# Patient Record
Sex: Female | Born: 1945 | Race: White | Hispanic: No | Marital: Married | Smoking: Never smoker
Health system: Southern US, Community
[De-identification: ages and names within clinical notes are randomized; demographics above are authoritative.]

## PROBLEM LIST (undated history)

## (undated) DIAGNOSIS — M549 Dorsalgia, unspecified: Secondary | ICD-10-CM

## (undated) DIAGNOSIS — M810 Age-related osteoporosis without current pathological fracture: Secondary | ICD-10-CM

## (undated) DIAGNOSIS — J45909 Unspecified asthma, uncomplicated: Secondary | ICD-10-CM

## (undated) DIAGNOSIS — G43909 Migraine, unspecified, not intractable, without status migrainosus: Secondary | ICD-10-CM

## (undated) DIAGNOSIS — H269 Unspecified cataract: Secondary | ICD-10-CM

## (undated) DIAGNOSIS — G8929 Other chronic pain: Secondary | ICD-10-CM

## (undated) DIAGNOSIS — D72819 Decreased white blood cell count, unspecified: Secondary | ICD-10-CM

## (undated) DIAGNOSIS — T7840XA Allergy, unspecified, initial encounter: Secondary | ICD-10-CM

## (undated) DIAGNOSIS — K648 Other hemorrhoids: Secondary | ICD-10-CM

## (undated) DIAGNOSIS — R42 Dizziness and giddiness: Secondary | ICD-10-CM

## (undated) DIAGNOSIS — D759 Disease of blood and blood-forming organs, unspecified: Secondary | ICD-10-CM

## (undated) DIAGNOSIS — M5431 Sciatica, right side: Secondary | ICD-10-CM

## (undated) DIAGNOSIS — M21371 Foot drop, right foot: Secondary | ICD-10-CM

## (undated) DIAGNOSIS — M797 Fibromyalgia: Secondary | ICD-10-CM

## (undated) DIAGNOSIS — Q631 Lobulated, fused and horseshoe kidney: Secondary | ICD-10-CM

## (undated) DIAGNOSIS — M1990 Unspecified osteoarthritis, unspecified site: Secondary | ICD-10-CM

## (undated) DIAGNOSIS — K219 Gastro-esophageal reflux disease without esophagitis: Secondary | ICD-10-CM

## (undated) DIAGNOSIS — Z87442 Personal history of urinary calculi: Secondary | ICD-10-CM

## (undated) DIAGNOSIS — R52 Pain, unspecified: Secondary | ICD-10-CM

## (undated) DIAGNOSIS — J329 Chronic sinusitis, unspecified: Secondary | ICD-10-CM

## (undated) HISTORY — DX: Unspecified osteoarthritis, unspecified site: M19.90

## (undated) HISTORY — DX: Decreased white blood cell count, unspecified: D72.819

## (undated) HISTORY — DX: Dizziness and giddiness: R42

## (undated) HISTORY — DX: Allergy, unspecified, initial encounter: T78.40XA

## (undated) HISTORY — DX: Migraine, unspecified, not intractable, without status migrainosus: G43.909

## (undated) HISTORY — DX: Chronic sinusitis, unspecified: J32.9

## (undated) HISTORY — DX: Gastro-esophageal reflux disease without esophagitis: K21.9

## (undated) HISTORY — DX: Other hemorrhoids: K64.8

## (undated) HISTORY — DX: Fibromyalgia: M79.7

## (undated) HISTORY — DX: Unspecified cataract: H26.9

## (undated) HISTORY — DX: Unspecified asthma, uncomplicated: J45.909

## (undated) HISTORY — PX: OTHER SURGICAL HISTORY: SHX169

## (undated) HISTORY — PX: TONSILLECTOMY: SUR1361

---

## 1966-07-26 HISTORY — PX: APPENDECTOMY: SHX54

## 1977-07-26 HISTORY — PX: BREAST EXCISIONAL BIOPSY: SUR124

## 1981-07-26 HISTORY — PX: ABDOMINAL HYSTERECTOMY: SHX81

## 1998-01-21 ENCOUNTER — Ambulatory Visit (HOSPITAL_COMMUNITY): Admission: RE | Admit: 1998-01-21 | Discharge: 1998-01-21 | Payer: Self-pay | Admitting: Obstetrics & Gynecology

## 1998-05-09 ENCOUNTER — Ambulatory Visit (HOSPITAL_COMMUNITY): Admission: RE | Admit: 1998-05-09 | Discharge: 1998-05-09 | Payer: Self-pay

## 1998-07-26 HISTORY — PX: BILATERAL SALPINGOOPHORECTOMY: SHX1223

## 1998-09-26 ENCOUNTER — Other Ambulatory Visit: Admission: RE | Admit: 1998-09-26 | Discharge: 1998-09-26 | Payer: Self-pay | Admitting: *Deleted

## 1998-11-06 ENCOUNTER — Ambulatory Visit (HOSPITAL_COMMUNITY): Admission: RE | Admit: 1998-11-06 | Discharge: 1998-11-06 | Payer: Self-pay | Admitting: *Deleted

## 1999-02-06 ENCOUNTER — Encounter: Payer: Self-pay | Admitting: Neurosurgery

## 1999-02-06 ENCOUNTER — Ambulatory Visit (HOSPITAL_COMMUNITY): Admission: RE | Admit: 1999-02-06 | Discharge: 1999-02-06 | Payer: Self-pay | Admitting: Neurosurgery

## 1999-08-11 ENCOUNTER — Encounter: Admission: RE | Admit: 1999-08-11 | Discharge: 1999-08-11 | Payer: Self-pay | Admitting: Allergy and Immunology

## 1999-08-11 ENCOUNTER — Encounter: Payer: Self-pay | Admitting: Allergy and Immunology

## 1999-08-25 ENCOUNTER — Encounter: Payer: Self-pay | Admitting: Neurosurgery

## 1999-08-25 ENCOUNTER — Ambulatory Visit (HOSPITAL_COMMUNITY): Admission: RE | Admit: 1999-08-25 | Discharge: 1999-08-25 | Payer: Self-pay

## 1999-09-03 ENCOUNTER — Encounter: Payer: Self-pay | Admitting: Neurosurgery

## 1999-09-07 ENCOUNTER — Encounter: Payer: Self-pay | Admitting: Neurosurgery

## 1999-09-07 ENCOUNTER — Inpatient Hospital Stay (HOSPITAL_COMMUNITY): Admission: RE | Admit: 1999-09-07 | Discharge: 1999-09-08 | Payer: Self-pay | Admitting: Neurosurgery

## 1999-09-29 ENCOUNTER — Encounter: Payer: Self-pay | Admitting: Neurosurgery

## 1999-09-29 ENCOUNTER — Encounter: Admission: RE | Admit: 1999-09-29 | Discharge: 1999-09-29 | Payer: Self-pay | Admitting: Neurosurgery

## 1999-11-18 ENCOUNTER — Encounter: Admission: RE | Admit: 1999-11-18 | Discharge: 1999-11-18 | Payer: Self-pay | Admitting: *Deleted

## 1999-12-16 ENCOUNTER — Ambulatory Visit (HOSPITAL_COMMUNITY): Admission: RE | Admit: 1999-12-16 | Discharge: 1999-12-16 | Payer: Self-pay | Admitting: Neurosurgery

## 1999-12-16 ENCOUNTER — Encounter: Payer: Self-pay | Admitting: Neurosurgery

## 2000-10-07 ENCOUNTER — Encounter: Payer: Self-pay | Admitting: Allergy and Immunology

## 2000-10-07 ENCOUNTER — Encounter: Admission: RE | Admit: 2000-10-07 | Discharge: 2000-10-07 | Payer: Self-pay

## 2000-11-23 ENCOUNTER — Encounter: Admission: RE | Admit: 2000-11-23 | Discharge: 2000-11-23 | Payer: Self-pay | Admitting: *Deleted

## 2000-11-23 ENCOUNTER — Encounter: Payer: Self-pay | Admitting: *Deleted

## 2000-12-05 ENCOUNTER — Other Ambulatory Visit: Admission: RE | Admit: 2000-12-05 | Discharge: 2000-12-05 | Payer: Self-pay | Admitting: *Deleted

## 2001-04-22 ENCOUNTER — Encounter: Admission: RE | Admit: 2001-04-22 | Discharge: 2001-04-22 | Payer: Self-pay | Admitting: *Deleted

## 2001-10-24 ENCOUNTER — Encounter: Admission: RE | Admit: 2001-10-24 | Discharge: 2001-10-24 | Payer: Self-pay | Admitting: Gastroenterology

## 2001-10-24 ENCOUNTER — Encounter: Payer: Self-pay | Admitting: Gastroenterology

## 2001-11-28 ENCOUNTER — Encounter: Payer: Self-pay | Admitting: *Deleted

## 2001-11-28 ENCOUNTER — Encounter: Admission: RE | Admit: 2001-11-28 | Discharge: 2001-11-28 | Payer: Self-pay | Admitting: *Deleted

## 2001-12-29 ENCOUNTER — Other Ambulatory Visit: Admission: RE | Admit: 2001-12-29 | Discharge: 2001-12-29 | Payer: Self-pay | Admitting: *Deleted

## 2002-06-11 ENCOUNTER — Encounter: Payer: Self-pay | Admitting: Family Medicine

## 2002-06-11 ENCOUNTER — Encounter: Admission: RE | Admit: 2002-06-11 | Discharge: 2002-06-11 | Payer: Self-pay | Admitting: Family Medicine

## 2002-12-04 ENCOUNTER — Encounter: Payer: Self-pay | Admitting: *Deleted

## 2002-12-04 ENCOUNTER — Encounter: Admission: RE | Admit: 2002-12-04 | Discharge: 2002-12-04 | Payer: Self-pay | Admitting: *Deleted

## 2003-01-22 ENCOUNTER — Other Ambulatory Visit: Admission: RE | Admit: 2003-01-22 | Discharge: 2003-01-22 | Payer: Self-pay | Admitting: *Deleted

## 2003-07-15 ENCOUNTER — Encounter: Admission: RE | Admit: 2003-07-15 | Discharge: 2003-07-15 | Payer: Self-pay | Admitting: Family Medicine

## 2003-12-25 ENCOUNTER — Encounter: Admission: RE | Admit: 2003-12-25 | Discharge: 2003-12-25 | Payer: Self-pay | Admitting: *Deleted

## 2004-01-09 ENCOUNTER — Observation Stay (HOSPITAL_COMMUNITY): Admission: EM | Admit: 2004-01-09 | Discharge: 2004-01-10 | Payer: Self-pay | Admitting: Emergency Medicine

## 2004-02-05 ENCOUNTER — Other Ambulatory Visit: Admission: RE | Admit: 2004-02-05 | Discharge: 2004-02-05 | Payer: Self-pay | Admitting: Obstetrics and Gynecology

## 2004-06-03 ENCOUNTER — Ambulatory Visit: Payer: Self-pay | Admitting: Family Medicine

## 2004-07-01 ENCOUNTER — Ambulatory Visit: Payer: Self-pay | Admitting: Family Medicine

## 2004-07-29 ENCOUNTER — Ambulatory Visit: Payer: Self-pay | Admitting: Family Medicine

## 2004-09-17 ENCOUNTER — Ambulatory Visit: Payer: Self-pay | Admitting: Family Medicine

## 2004-10-20 ENCOUNTER — Ambulatory Visit: Payer: Self-pay | Admitting: Family Medicine

## 2004-10-28 ENCOUNTER — Encounter: Admission: RE | Admit: 2004-10-28 | Discharge: 2004-10-28 | Payer: Self-pay | Admitting: Family Medicine

## 2004-11-19 ENCOUNTER — Ambulatory Visit: Payer: Self-pay | Admitting: Family Medicine

## 2004-11-24 ENCOUNTER — Ambulatory Visit: Payer: Self-pay | Admitting: Professional

## 2004-12-08 ENCOUNTER — Ambulatory Visit: Payer: Self-pay | Admitting: Professional

## 2004-12-15 ENCOUNTER — Ambulatory Visit: Payer: Self-pay | Admitting: Professional

## 2004-12-22 ENCOUNTER — Ambulatory Visit: Payer: Self-pay | Admitting: Professional

## 2004-12-23 ENCOUNTER — Ambulatory Visit: Payer: Self-pay | Admitting: Family Medicine

## 2005-01-05 ENCOUNTER — Ambulatory Visit: Payer: Self-pay | Admitting: Professional

## 2005-01-08 ENCOUNTER — Encounter: Admission: RE | Admit: 2005-01-08 | Discharge: 2005-01-08 | Payer: Self-pay | Admitting: Family Medicine

## 2005-01-20 ENCOUNTER — Ambulatory Visit: Payer: Self-pay | Admitting: Family Medicine

## 2005-02-02 ENCOUNTER — Ambulatory Visit: Payer: Self-pay | Admitting: Professional

## 2005-02-09 ENCOUNTER — Ambulatory Visit: Payer: Self-pay | Admitting: Professional

## 2005-02-16 ENCOUNTER — Ambulatory Visit: Payer: Self-pay | Admitting: Professional

## 2005-02-23 ENCOUNTER — Other Ambulatory Visit: Admission: RE | Admit: 2005-02-23 | Discharge: 2005-02-23 | Payer: Self-pay | Admitting: Family Medicine

## 2005-02-23 ENCOUNTER — Ambulatory Visit: Payer: Self-pay | Admitting: Professional

## 2005-02-23 ENCOUNTER — Ambulatory Visit: Payer: Self-pay | Admitting: Family Medicine

## 2005-03-09 ENCOUNTER — Ambulatory Visit: Payer: Self-pay | Admitting: Professional

## 2005-03-11 ENCOUNTER — Ambulatory Visit: Payer: Self-pay | Admitting: Family Medicine

## 2005-03-16 ENCOUNTER — Ambulatory Visit: Payer: Self-pay | Admitting: Professional

## 2005-03-30 ENCOUNTER — Ambulatory Visit: Payer: Self-pay | Admitting: Professional

## 2005-04-12 ENCOUNTER — Ambulatory Visit: Payer: Self-pay | Admitting: Family Medicine

## 2005-04-27 ENCOUNTER — Ambulatory Visit: Payer: Self-pay | Admitting: Professional

## 2005-05-11 ENCOUNTER — Ambulatory Visit: Payer: Self-pay | Admitting: Family Medicine

## 2005-05-11 ENCOUNTER — Ambulatory Visit: Payer: Self-pay | Admitting: Professional

## 2005-06-08 ENCOUNTER — Ambulatory Visit: Payer: Self-pay | Admitting: Family Medicine

## 2005-06-13 ENCOUNTER — Emergency Department (HOSPITAL_COMMUNITY): Admission: EM | Admit: 2005-06-13 | Discharge: 2005-06-13 | Payer: Self-pay | Admitting: Family Medicine

## 2005-07-28 ENCOUNTER — Ambulatory Visit: Payer: Self-pay | Admitting: Family Medicine

## 2005-08-10 ENCOUNTER — Encounter: Admission: RE | Admit: 2005-08-10 | Discharge: 2005-08-10 | Payer: Self-pay | Admitting: Allergy and Immunology

## 2005-09-20 ENCOUNTER — Ambulatory Visit: Payer: Self-pay | Admitting: Family Medicine

## 2005-11-10 ENCOUNTER — Ambulatory Visit: Payer: Self-pay | Admitting: Family Medicine

## 2006-01-10 ENCOUNTER — Encounter: Admission: RE | Admit: 2006-01-10 | Discharge: 2006-01-10 | Payer: Self-pay | Admitting: Family Medicine

## 2006-02-07 ENCOUNTER — Ambulatory Visit: Payer: Self-pay | Admitting: Family Medicine

## 2006-02-16 ENCOUNTER — Ambulatory Visit: Payer: Self-pay | Admitting: Gastroenterology

## 2006-03-08 ENCOUNTER — Ambulatory Visit: Payer: Self-pay | Admitting: Family Medicine

## 2006-04-22 ENCOUNTER — Encounter: Admission: RE | Admit: 2006-04-22 | Discharge: 2006-04-22 | Payer: Self-pay | Admitting: Family Medicine

## 2006-04-22 ENCOUNTER — Ambulatory Visit: Payer: Self-pay | Admitting: Family Medicine

## 2006-05-03 ENCOUNTER — Ambulatory Visit: Payer: Self-pay | Admitting: Family Medicine

## 2006-08-01 ENCOUNTER — Ambulatory Visit: Payer: Self-pay | Admitting: Family Medicine

## 2006-09-23 ENCOUNTER — Ambulatory Visit: Payer: Self-pay | Admitting: Family Medicine

## 2006-09-26 ENCOUNTER — Encounter: Admission: RE | Admit: 2006-09-26 | Discharge: 2006-09-26 | Payer: Self-pay | Admitting: Obstetrics and Gynecology

## 2006-10-04 ENCOUNTER — Ambulatory Visit: Payer: Self-pay | Admitting: Family Medicine

## 2006-10-04 LAB — CONVERTED CEMR LAB
BUN: 9 mg/dL (ref 6–23)
Basophils Relative: 0.6 % (ref 0.0–1.0)
CO2: 26 meq/L (ref 19–32)
Eosinophils Absolute: 0.1 10*3/uL (ref 0.0–0.6)
Eosinophils Relative: 3.1 % (ref 0.0–5.0)
GFR calc Af Amer: 109 mL/min
Glucose, Bld: 72 mg/dL (ref 70–99)
HCT: 39.7 % (ref 36.0–46.0)
Lymphocytes Relative: 39.8 % (ref 12.0–46.0)
MCV: 90.2 fL (ref 78.0–100.0)
Monocytes Absolute: 0.4 10*3/uL (ref 0.2–0.7)
Neutrophils Relative %: 45.1 % (ref 43.0–77.0)
Platelets: 207 10*3/uL (ref 150–400)
Potassium: 2.9 meq/L — ABNORMAL LOW (ref 3.5–5.1)
RBC: 4.4 M/uL (ref 3.87–5.11)
Sodium: 140 meq/L (ref 135–145)
WBC: 3.5 10*3/uL — ABNORMAL LOW (ref 4.5–10.5)

## 2006-10-07 ENCOUNTER — Ambulatory Visit: Payer: Self-pay | Admitting: Family Medicine

## 2006-10-07 LAB — CONVERTED CEMR LAB: Potassium: 3.9 meq/L (ref 3.5–5.1)

## 2006-11-18 ENCOUNTER — Ambulatory Visit: Payer: Self-pay | Admitting: Family Medicine

## 2007-01-19 ENCOUNTER — Encounter: Admission: RE | Admit: 2007-01-19 | Discharge: 2007-01-19 | Payer: Self-pay | Admitting: Obstetrics & Gynecology

## 2007-02-08 ENCOUNTER — Telehealth (INDEPENDENT_AMBULATORY_CARE_PROVIDER_SITE_OTHER): Payer: Self-pay | Admitting: *Deleted

## 2007-03-07 ENCOUNTER — Ambulatory Visit: Payer: Self-pay | Admitting: Family Medicine

## 2007-03-07 DIAGNOSIS — G5600 Carpal tunnel syndrome, unspecified upper limb: Secondary | ICD-10-CM | POA: Insufficient documentation

## 2007-03-07 DIAGNOSIS — I868 Varicose veins of other specified sites: Secondary | ICD-10-CM | POA: Insufficient documentation

## 2007-03-07 DIAGNOSIS — M797 Fibromyalgia: Secondary | ICD-10-CM | POA: Insufficient documentation

## 2007-03-07 DIAGNOSIS — E739 Lactose intolerance, unspecified: Secondary | ICD-10-CM | POA: Insufficient documentation

## 2007-03-07 DIAGNOSIS — G43109 Migraine with aura, not intractable, without status migrainosus: Secondary | ICD-10-CM | POA: Insufficient documentation

## 2007-03-07 DIAGNOSIS — Z87442 Personal history of urinary calculi: Secondary | ICD-10-CM | POA: Insufficient documentation

## 2007-03-07 DIAGNOSIS — G43809 Other migraine, not intractable, without status migrainosus: Secondary | ICD-10-CM | POA: Insufficient documentation

## 2007-03-07 DIAGNOSIS — F411 Generalized anxiety disorder: Secondary | ICD-10-CM | POA: Insufficient documentation

## 2007-03-21 ENCOUNTER — Encounter: Payer: Self-pay | Admitting: Family Medicine

## 2007-04-11 ENCOUNTER — Encounter: Payer: Self-pay | Admitting: Family Medicine

## 2007-04-23 ENCOUNTER — Emergency Department (HOSPITAL_COMMUNITY): Admission: EM | Admit: 2007-04-23 | Discharge: 2007-04-23 | Payer: Self-pay | Admitting: Family Medicine

## 2007-04-24 ENCOUNTER — Ambulatory Visit: Payer: Self-pay | Admitting: Internal Medicine

## 2007-04-25 ENCOUNTER — Ambulatory Visit: Payer: Self-pay | Admitting: Pulmonary Disease

## 2007-04-25 LAB — CONVERTED CEMR LAB
Calcium: 9.5 mg/dL (ref 8.4–10.5)
Chloride: 105 meq/L (ref 96–112)
Creatinine, Ser: 0.6 mg/dL (ref 0.4–1.2)
GFR calc non Af Amer: 107 mL/min
Potassium: 3.7 meq/L (ref 3.5–5.1)
Sodium: 139 meq/L (ref 135–145)

## 2007-04-28 ENCOUNTER — Ambulatory Visit: Payer: Self-pay | Admitting: Internal Medicine

## 2007-05-01 ENCOUNTER — Ambulatory Visit: Admission: RE | Admit: 2007-05-01 | Discharge: 2007-05-01 | Payer: Self-pay | Admitting: Pulmonary Disease

## 2007-05-09 ENCOUNTER — Encounter: Payer: Self-pay | Admitting: Family Medicine

## 2007-05-10 ENCOUNTER — Ambulatory Visit: Payer: Self-pay | Admitting: Pulmonary Disease

## 2007-05-12 ENCOUNTER — Encounter: Payer: Self-pay | Admitting: Family Medicine

## 2007-05-18 ENCOUNTER — Encounter: Payer: Self-pay | Admitting: Family Medicine

## 2007-06-09 ENCOUNTER — Ambulatory Visit: Payer: Self-pay | Admitting: Family Medicine

## 2007-06-09 DIAGNOSIS — J383 Other diseases of vocal cords: Secondary | ICD-10-CM | POA: Insufficient documentation

## 2007-07-06 ENCOUNTER — Encounter: Payer: Self-pay | Admitting: Family Medicine

## 2007-07-11 ENCOUNTER — Encounter: Payer: Self-pay | Admitting: Family Medicine

## 2007-08-08 ENCOUNTER — Telehealth: Payer: Self-pay | Admitting: Pulmonary Disease

## 2007-08-10 ENCOUNTER — Encounter: Payer: Self-pay | Admitting: Family Medicine

## 2007-12-13 ENCOUNTER — Ambulatory Visit: Payer: Self-pay | Admitting: Pulmonary Disease

## 2007-12-13 DIAGNOSIS — J301 Allergic rhinitis due to pollen: Secondary | ICD-10-CM | POA: Insufficient documentation

## 2008-01-03 ENCOUNTER — Encounter (INDEPENDENT_AMBULATORY_CARE_PROVIDER_SITE_OTHER): Payer: Self-pay | Admitting: Internal Medicine

## 2008-01-03 ENCOUNTER — Ambulatory Visit: Payer: Self-pay | Admitting: Family Medicine

## 2008-01-03 DIAGNOSIS — E876 Hypokalemia: Secondary | ICD-10-CM | POA: Insufficient documentation

## 2008-01-04 LAB — CONVERTED CEMR LAB
BUN: 16 mg/dL (ref 6–23)
CO2: 26 meq/L (ref 19–32)
GFR calc Af Amer: 93 mL/min
Glucose, Bld: 78 mg/dL (ref 70–99)
Potassium: 3.7 meq/L (ref 3.5–5.1)

## 2008-01-22 ENCOUNTER — Encounter: Admission: RE | Admit: 2008-01-22 | Discharge: 2008-01-22 | Payer: Self-pay | Admitting: Obstetrics & Gynecology

## 2008-07-26 HISTORY — PX: NASAL SINUS SURGERY: SHX719

## 2009-01-22 ENCOUNTER — Encounter: Admission: RE | Admit: 2009-01-22 | Discharge: 2009-01-22 | Payer: Self-pay | Admitting: Obstetrics & Gynecology

## 2009-05-20 ENCOUNTER — Encounter: Admission: RE | Admit: 2009-05-20 | Discharge: 2009-05-20 | Payer: Self-pay | Admitting: Internal Medicine

## 2010-01-23 ENCOUNTER — Encounter: Admission: RE | Admit: 2010-01-23 | Discharge: 2010-01-23 | Payer: Self-pay | Admitting: Obstetrics & Gynecology

## 2010-04-02 ENCOUNTER — Encounter: Admission: RE | Admit: 2010-04-02 | Discharge: 2010-04-02 | Payer: Self-pay | Admitting: Otolaryngology

## 2010-11-18 ENCOUNTER — Other Ambulatory Visit (INDEPENDENT_AMBULATORY_CARE_PROVIDER_SITE_OTHER): Payer: Self-pay | Admitting: Otolaryngology

## 2010-11-18 DIAGNOSIS — J320 Chronic maxillary sinusitis: Secondary | ICD-10-CM

## 2010-11-25 ENCOUNTER — Other Ambulatory Visit: Payer: Self-pay

## 2010-12-01 ENCOUNTER — Ambulatory Visit
Admission: RE | Admit: 2010-12-01 | Discharge: 2010-12-01 | Disposition: A | Discharge status: Home / Self Care | Payer: Medicare Other | Source: Ambulatory Visit | Attending: Otolaryngology | Admitting: Otolaryngology

## 2010-12-01 DIAGNOSIS — J320 Chronic maxillary sinusitis: Secondary | ICD-10-CM

## 2010-12-08 NOTE — Assessment & Plan Note (Signed)
[facility]                             PULMONARY OFFICE NOTE   NAME:Dixon, Leslie ISMAEL                       MRN:          [id number]  DATE:05/10/2007                            DOB:          05-03-1946    SUBJECTIVE:  Ms. Stuckey comes in today after her recent high-resolution  CT scan as well as full PFTs.  At the last visit, it was clear that she  had classic vocal cord dysfunction, and it was unclear whether she  really had asthma or not.  Her most recent chest x-ray showed a very  prominent interstitial pattern that the radiologist agreed may be  significant.  She underwent a CT scan of the chest as well as PFTs.  Her  PFTs were totally within normal limits with no airflow obstruction,  restriction, or DLCO abnormality.  Her high-resolution CT showed no  evidence of bronchiectasis or subpleural fibrosis.  The patient, since  the last visit, has been having a great deal of coughing whenever she  takes her Symbicort.  She has discontinued this at least for a few  weeks, and has seen no difference in her breathing.  She has been trying  to avoid throat-clearing as much as possible with non-mentholated  lozenges.  She does have a followup with a voice disorder center in  December.   PHYSICAL EXAM:  GENERAL:  She is an overweight female in no acute  distress.  Blood pressure 108/58, pulse 97, temperature 98, weight 150 pounds, O2  saturation on room air is 99%.  CHEST:  Totally clear.  CARDIAC:  Regular rate and rhythm.   IMPRESSION:  Significant upper airway dysfunction secondary to vocal  cord dysfunction.  I also think that post-nasal drip from allergies, as  well as possible laryngopharyngeal reflux could be playing a role.  I  think that it is very unlikely that she has asthma at this point in  time, and I have asked her to discontinue all of her inhalers, which can  definitely irritate the upper airway.  I have told her, however, that if  she  begins to have shortness of breath, chest tightness, or increasing  symptoms consistently, that she is to get into the office immediately so  we can verify whether she really does have airflow obstruction or not.  The patient is totally happy with this decision.   PLAN:  1. Continue medication for possible reflux and allergic rhinitis.  2. Discontinue all asthma medications.  She is to follow up if she      begins to have difficulties so we can verify airflow obstruction.  3. Keep followup with the Voice [facility] Center at [facility] for her      vocal cord dysfunction.  4. The patient will follow up on a p.r.n. basis.     Barbaraann Share, MD,FCCP  Electronically Signed    [facility]/MedQ  DD: 05/10/2007  DT: 05/11/2007  Job #: [id number]   cc:   Marne A. Milinda Antis, MD

## 2010-12-08 NOTE — Assessment & Plan Note (Signed)
[facility]                             PULMONARY OFFICE NOTE   NAME:Leslie Dixon, Leslie Dixon                       MRN:          [id number]  DATE:04/25/2007                            DOB:          Sep 19, 1945    HISTORY OF PRESENT ILLNESS:  The patient is a very pleasant 65 year old  white female who I have been asked to see for cough and possible asthma.  The patient carries a diagnosis of presumed asthma that she has had  since 1992 and is followed by Dr. Lucie Leather.  She also has a lot of  allergies that is being treated by him as well.  Patient also has a  history of upper airway symptoms that is being followed by the [facility] at [facility].  From her description it sounds like they  think she has vocal cord dysfunction.  She apparently has had an upper  airway evaluation there where she was found to have swollen glottic  structures and abnormal movement of her vocal cords.  She was seen by  them for a chronic raspy voice.  For her asthma the patient has been  maintained on Singulair 10 mg daily as well as Symbicort 160/4.5 one  daily and Astelin for her nares daily.  Patient currently is having a  lot of cough as well as throat clearing.  She is having some postnasal  drip but is not having any gastroesophageal reflux disease.  Patient has  had spirometry in September that was totally within normal limits.   PAST MEDICAL HISTORY:  1. History of asthma since 1992.  2. History of allergic rhinitis.  3. History of ocular migraines.  4. History of spine surgery x2.  5. Status post hysterectomy, tubal ligation and appendectomy.  6. History of fibromyalgia.   CURRENT MEDICATIONS:  1. Allegra 180 daily.  2. Singulair 10 mg daily.  3. Premarin 0.3 mg daily.  4. Mobic 7.5 mg one daily p.r.n.  5. Prednisone 5 mg b.i.d.  6. Elavil 100 mg nightly.  7. Protonix 40 mg b.i.d.  8. Symbicort 160/4.5 daily.  9. Astelin nasal spray daily.  10.Actonel 150  mg monthly.  11.P.r.n. albuterol and Xopenex nebulizers.   PATIENT HAS AN ALLERGY TO PENICILLIN, SULFA, CODEINE AND BIAXIN.  SHE  ALSO CLAIMS TO BE ASPIRIN INTOLERANT.   SOCIAL HISTORY:  She is married and has children.  She has never smoked.  She lives with her husband.   FAMILY HISTORY:  Remarkable for father having emphysema, sister having  asthma and father having cancer.   REVIEW OF SYSTEMS:  As per history of present illness, also see patient  intake form documented on the chart.   PHYSICAL EXAMINATION:  GENERAL:  She is a well-developed female in no  acute distress.  Blood pressure is 102/58, pulse 100, temperature 97.9,  weight is 149 pounds, she is 5 foot 6-1/2 inches tall, O2 saturation on  room air is 97%.  HEENT:  Pupils equal, round, reactive to light and accommodation.  Extraocular muscles are intact.  Nares are patent without discharge.  Oropharynx  is clear.  NECK:  Supple without JVD or lymphadenopathy, there is no palpable  thyromegaly.  CHEST:  Reveals distinct basilar crackles.  CARDIAC:  Reveals regular rate and rhythm.  ABDOMEN:  Soft, nontender with good bowel sounds.  Genital, rectal and breast exam was not done and not indicated.  LOWER EXTREMITIES:  Without edema and pulses are intact distally.  NEUROLOGICALLY:  Alert and oriented with no obvious observable motor  defects.  Should also be noted the patient had constant throat clearing during my  time with her.   LABORATORY DATA:  Patient has had a recent chest x-ray with what appears  to be chronic basilar interstitial changes of unknown etiology.   IMPRESSION:  1. Questionable asthma.  It is really unclear to me whether she truly      does have asthma or whether this is primarily upper airway      dysfunction with vocal cord dysfunction which represents      pseudoasthma.  Patient is followed at the [facility].      The patient is continually clearing her throat and coughing here       today.  That is classic by her description for upper airway      dysfunction.  She is on aggressive treatment for acid reflux and      also on a prednisone taper for her allergies and possible pulmonary      issues.  The patient really needs to quit clearing her throat and      to try and use lozenges to settle down her globus sensation.  At      this point in time we will continue her medications for asthma, and      will see how this plays out over the next 6 months.  2. Questionable pulmonary fibrosis by exam and by chest x-ray.  The      patient will benefit from full pulmonary function tests as well as      a high-resolution CT.   PLAN:  1. Obtain records from Dr. Lucie Leather.  2. Finish prednisone taper.  3. Full PFTs and high resolution CT of the chest.  4. I have asked the patient to stop clearing her throat and to use non-      mentholated lozenges      as much as possible.  She also has cough syrup to use p.r.n.  5. The patient will follow up after the above.     Barbaraann Share, MD,FCCP  Electronically Signed    [facility]/MedQ  DD: 04/28/2007  DT: 04/28/2007  Job #: [id number]   cc:   Karie Schwalbe, MD

## 2010-12-11 NOTE — H&P (Signed)
[place]. [facility]  Patient:    Leslie, Dixon                       MRN: [id number] Adm. Date:  [id number] Attending:  Barton Fanny CC:         Hewitt Shorts, M.D.                         History and Physical  HISTORY OF PRESENT ILLNESS:  The patient is a 65 year old, left-handed, white female who has been a patient of mine for a number of years, status post a C6-7  anterior cervical diskectomy and arthrodesis with bone graft in December 1996. She was evaluated for a new problem last week, with right-sided low back pain for several months.  However, about 3-1/2 weeks ago she was moving some boxes at home and developed a sudden, severe right-sided low back pain that extended into the  right buttock, posterior thigh and calf.  At this point the discomfort is only intermittent to the right calf.  She describes numbness and tingling in the right great toe and a sense of weakness in the right lower extremity.  She has been using ibuprofen and Flexeril without relief.  She finds that if she rests she is somewhat more comfortable whenever she resumes activity.  The pain recurs and could be exacerbated by change in position, driving, etc.  The patient was studied as an outpatient with x-rays and MRI scan.  The MRI shows degeneration of several lumbar disks, most prominently L4-5 with modic changes particularly to the right side at L4-5.  Superimposed upon this is a central-to-right L4-5 disk herniation with thecal sac and nerve root compression. The patient is admitted now for a right L4-5 lumbar laminotomy and microdiskectomy.  PAST MEDICAL HISTORY:  Notable for a history of fibromyalgia, followed by Dr. Saverio Danker.  She has no history though of hypertension, myocardial infarction, cancer, stroke, diabetes, peptic ulcer disease, or lung disease.  PREVIOUS SURGERY:  Her C6-7 ACDF in 1996, hysterectomy in 1991, and left  leg vein stripping in 1990.  ALLERGIES:  She reports allergies to PENICILLIN and CODEINE.  She had a positive allergy test for PENICILLIN, and CODEINE was shown to cause nausea.  MEDICATIONS:  1. Premarin 0.625 mg q.d.  2. Elavil 20 mg q.h.s.  3. Claritin 10 mg in a.m.  4. Pulmicort 200 mcg inhaler 2 puffs in a.m.  5. Nasacort AQ in a.m.  6. Variety of vitamins.  FAMILY HISTORY:  Mother is in good health.  Father has passed on.  SOCIAL HISTORY:  The patient is married.  She works as a Media planner in our community.  She does not smoke.  She drinks an occasional glass of wine.  REVIEW OF SYSTEMS:  Unremarkable other than for the acute difficulty described above.  PHYSICAL EXAMINATION:  GENERAL:  Well-developed, well-nourished, white female in no acute distress.  VITAL SIGNS:  Temperature 97.1, pulse 70, blood pressure 110/74, respiratory rate 16.  Height 5 feet 8 inches.  Weight 136 pounds.  LUNGS:  Clear to auscultation.  She has symmetrical respiratory excursion.  HEART:  Regular rate and rhythm.  S1 and S2.  There is no murmur.  ABDOMEN:  Soft and nondistended.  Bowel sounds are present.  EXTREMITIES:  No clubbing, cyanosis or edema.  MUSCULOSKELETAL:  Diffuse tenderness to palpation in the lumbar region, worse in the right paralumbar  region and in the midline and least most so in the paralumbar region.  She is limited in forward flexion to about 30 degrees due to pain radiating down to the right lower extremity.  She is able to extend well. Straight leg raising is positive on the right at about 20-30 degrees with pain shooting nto the right lower extremity.  She has a positive cross straight leg raising on the left about about 70-80 degrees, with pain into the right side of her low back and into the right buttock.  NEUROLOGIC:  Examination shows 5/5 strength to the lower extremities and into the dorsiflexors, planter flexor, and extensor hallucis  longus.  However, she has difficulty exuding ___________ with the distal right lower extremity due to pain. Sensation is intact to pinprick.  Reflexes are 1-2 at the quadriceps and gastrocnemii.  They are symmetrical bilaterally.  Toe are downgoing bilaterally. Examining her gait, she clearly favors the right lower extremity and avoids bearing full weight on it.  IMPRESSION:  Right lumbar radiculopathy ___________ central-to-right L4-5 lumbar disk herniation.  The patient has intact motor and sensory function but has relatively disabling pain.  PLAN:  The patient will be admitted for a right L4-5 lumbar laminotomy and microdiskectomy.  We discussed alternative to surgery, the nature of the surgical procedure, typical length of surgery, hospital stay and overall recuperation; her limitations during the postoperative period; and risks of surgery including risks of infection, bleeding, possible need for transfusion; the risk of nerve root dysfunction, pain, weakness, numbness, or paresthesias; the risk of recurrent disk herniation and possible need for further surgery; and anesthetic risks of myocardial infarction, stroke, pneumonia, and death.  Understanding all of this, she does wish to proceed with surgery and is admitted for such. DD:  09/07/99 TD:  09/07/99 Job: [id number]

## 2010-12-11 NOTE — Discharge Summary (Signed)
NAME:  Leslie Dixon, Leslie Dixon                          ACCOUNT NO.:  [id number]   MEDICAL RECORD NO.:  [id number]                   PATIENT TYPE:  INP   LOCATION:  5527                                 FACILITY:  [facility]   PHYSICIAN:  Rosalyn Gess. Norins, M.D. [facility]         DATE OF BIRTH:  June 09, 1946   DATE OF ADMISSION:  01/09/2004  DATE OF DISCHARGE:  01/10/2004                                 DISCHARGE SUMMARY   DISCHARGE DIAGNOSES:  1. Atypical chest pain.  2. Hypotension.  3. Presyncope.  4. Anxiety.   BRIEF ADMISSION HISTORY:  Ms. Kendra is a 65 year old white female who is in  overall good health. She presented to the emergency department with chest  pain.  This occurred while she was ironing around 8:30 in the morning.  She  felt like her heart was beating fast. This was associated with pain beneath  her left breast. She sat down and had some water, but her symptoms did not  improve.  She called a neighbor, who then called 911.  EMS arrived, as the  tightness, racing and pain were easing off.  She was given 4 aspirin and  nitroglycerin en route to the emergency department.   PAST MEDICAL HISTORY:  1. Asthma.  2. Arthritis.  3. Ocular migraines.  4. 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  5. History of lumbar L4-5 laminectomy in 2001.  6. Fibromyalgia.  7. Normal cholesterol.   HOSPITAL COURSE:  NO. 1. CARDIOVASCULAR:  Patient presented with atypical  chest pain.  Serial cardiac enzymes were negative.  EKG was without  ischemia.  D-dimer was negative, therefore, PE was doubtful.  The patient  did have some episodes of SVT and some PVCs but no evidence of atrial  fibrillation.  As noted, she ruled out for MI. This may be a component of  her fibromyalgia.   NO. 2.  GASTROESOPHAGEAL REFLUX DISEASE:  Stable. She is currently on proton  pump inhibitor.   NO. 3.  ANXIETY:  This appeared to be a large component of her symptoms,  according to the admitting physician.   NO. 4.  HYPOTENSION:  On January 10, 2004, her systolic blood pressure is 88.  In reviewing EMS reports, her systolic blood pressures have ranged between  130-140.  The patient denied any GI loss including vomiting or diarrhea.  She was complaining of some lightheadedness and recent cold symptoms.  Suspect this is secondary to her hypotension. She was bolused 500 cc of  fluids with good results.  Following her bolus, her blood pressure was  118/70.  Orthostatics were unremarkable and she was not orthostatic.   The patient was felt to be stable for discharge home with outpatient follow  up.   PENDING LABORATORY DATA:  At the time of this dictation, fasting lipid  profile and TSH.   DISCHARGE MEDICATIONS:  1. Allegra 180 mg daily.  2. Singulair 10 mg daily.  3. Neurontin 300 mg t.i.d.  4. Elavil 100 mg q.h.s.  5. Aciphex 20 mg daily.  6. Premarin 0.3 mg per day.  7. Pulmicort as needed.   FOLLOW UP:  For a treadmill Cardiolite on Monday, June 20 at 12:15 p.m.,  then follow up with Dr. Milinda Antis next week as previously scheduled.      Cornell Barman, P.A. [facility]                  Michael E. Norins, M.D. [facility]    LC/MEDQ  D:  01/10/2004  T:  01/12/2004  Job:  [id number]   cc:   Marne A. Milinda Antis, M.D. [facility]

## 2010-12-11 NOTE — Procedures (Signed)
[facility]                                 ULTRASOUND STUDY   NAME:Leslie Dixon, Leslie Dixon                       MRN:          [id number]  DATE:02/16/2006                            DOB:          20-Feb-1946    ACCESSION NUMBER:  [id number]   READING PHYSICIAN:  Vania Rea. Jarold Motto, MD, Clementeen Graham, FACP   PROCEDURE:  Multiplanar abdominal ultrasound imaging was performed in the  upright, supine, right and left lateral decubitus positions.   RESULTS:  Aorta normal, 1.7 cm.  The IVC is patent.   The pancreas appears normal throughout the head, body and tail without  evidence of ductal dilatation, pancreatic masses or peripancreatic  inflammation.   Gallbladder is well distended, thin walled, with no pericholecystic fluid or  intraluminal echogenic foci to suggest gallstone disease.   Common bile duct measures 2.1 mm in maximal diameter and appears normal  without evidence of intraluminal foci.   The liver appears normal without evidence of parenchymal lesion, ductal  dilatation or vascular abnormality.   Kidneys measure right 9.2 cm, left 6.0 cm.  Both kidneys are normal in  appearance.   Spleen is normal in size  measuring 8.4 cm without parenchymal lesion.   ASSESSMENT:  This was a normal upper abdominal ultrasound exam without  evidence of cholelithiasis or other specific abnormalities.  Pancreas is  well-visualized and appears normal.                                   Vania Rea. Jarold Motto, MD, Clementeen Graham, [place]   DRP/MedQ  DD:  02/18/2006  DT:  02/18/2006  Job #:  [id number]   cc:   Marne A. Milinda Antis, MD

## 2010-12-11 NOTE — Op Note (Signed)
[place]. [facility]  Patient:    Leslie Dixon, Leslie Dixon                       MRN: [id number] Proc. Date: 09/07/99 Adm. Date:  [id number] Attending:  Barton Fanny CC:         Hewitt Shorts, M.D.                           Operative Report  PREOPERATIVE DIAGNOSIS:  Right L4-5 lumbar disk herniation.  POSTOPERATIVE DIAGNOSIS:  Right L4-5 lumbar disk herniation.  PROCEDURE:  Right L4-5 lumbar laminotomy and microdiskectomy.  SURGEON:  Hewitt Shorts, M.D.  ANESTHESIA:  General endotracheal.  INDICATIONS:  A 65 year old woman who presented with an acute right lumbar radiculopathy.  She was found by MRI scan to have a subsequent right L4-5 lumbar disk herniation.  The decision was made to proceed with elective laminotomy and  microdiskectomy.  DESCRIPTION OF PROCEDURE:  The patient was brought to the operating room and placed under general endotracheal anesthesia.  The patient was turned to the prone position.  The lumbar region was prepped with Betadine soap and solution and draped in a sterile fashion.  The midline was infiltrated with local anesthetic with epinephrine, and a midline incision was made over the L4-5 level and carried down through the subcutaneous tissue.  Bipolar cautery and electrocautery were used o maintain hemostasis.  Dissection was carried down through the lumbar fascia, which was incised on the right side of the midline, and the paraspinal muscles were dissected over the spinous process and lamina in a subperiosteal fashion.  The 4-5 intralaminar space was identified, and an x-ray was taken to confirm the localization, and then we proceeded with a laminotomy using the Midas Rex drill and AM8 bur and Kerrison punches.  The microscope was draped and brought into the field to provide additional magnification, illumination, and visualization.  The ligamentum flavum was removed, and we then explored the epidural  space, gently retracting the thecal sac and nerve root medially exposing the underlying L4-5 isk herniation.  The remaining ______ were incised, and the disk space entered, and  ______  the degenerated disk material removed with good decompression being achieved of the thecal sac and right L5 nerve root.  In the end, all loose fragments of disk material were removed from both the disk space and the epidural space.  Once the diskectomy was completed, hemostasis was established with the se of bipolar cautery, and then we infused 2 cc of fentanyl and 80 mg of Depo-Medrol into the epidural space and proceeded with closure.  The deep fascia was closed  with interrupted, undyed #0 Vicryl sutures.  The subcutaneous and subcuticular ere closed with interrupted and inverted 2-0 uninterrupted Vicryl sutures, and the kin was reapproximated with Steri-Strips.  The wound was dressed with Adaptic and sterile gauze.  The patient tolerated the procedure well.  The estimated blood oss was 25 cc.  Sponge and needle count were correct.  Following surgery, the patient was to be turned to a supine position, reverse the anesthetic, extubated, and transferred to the recovery room for further care. DD:  09/07/99 TD:  09/07/99 Job: [id number]

## 2010-12-24 ENCOUNTER — Other Ambulatory Visit: Payer: Self-pay | Admitting: Obstetrics & Gynecology

## 2010-12-24 DIAGNOSIS — Z1231 Encounter for screening mammogram for malignant neoplasm of breast: Secondary | ICD-10-CM

## 2011-01-25 ENCOUNTER — Ambulatory Visit
Admission: RE | Admit: 2011-01-25 | Discharge: 2011-01-25 | Disposition: A | Discharge status: Home / Self Care | Payer: Medicare Other | Source: Ambulatory Visit | Attending: Obstetrics & Gynecology | Admitting: Obstetrics & Gynecology

## 2011-01-25 DIAGNOSIS — Z1231 Encounter for screening mammogram for malignant neoplasm of breast: Secondary | ICD-10-CM

## 2011-05-06 DIAGNOSIS — Q631 Lobulated, fused and horseshoe kidney: Secondary | ICD-10-CM | POA: Insufficient documentation

## 2011-06-08 LAB — HM COLONOSCOPY

## 2011-08-10 DIAGNOSIS — H02109 Unspecified ectropion of unspecified eye, unspecified eyelid: Secondary | ICD-10-CM | POA: Diagnosis not present

## 2011-08-10 DIAGNOSIS — H04129 Dry eye syndrome of unspecified lacrimal gland: Secondary | ICD-10-CM | POA: Diagnosis not present

## 2011-08-10 DIAGNOSIS — H02539 Eyelid retraction unspecified eye, unspecified lid: Secondary | ICD-10-CM | POA: Diagnosis not present

## 2011-08-17 DIAGNOSIS — H905 Unspecified sensorineural hearing loss: Secondary | ICD-10-CM | POA: Diagnosis not present

## 2011-08-17 DIAGNOSIS — R05 Cough: Secondary | ICD-10-CM | POA: Diagnosis not present

## 2011-08-17 DIAGNOSIS — R059 Cough, unspecified: Secondary | ICD-10-CM | POA: Diagnosis not present

## 2011-08-17 DIAGNOSIS — H6980 Other specified disorders of Eustachian tube, unspecified ear: Secondary | ICD-10-CM | POA: Diagnosis not present

## 2011-08-17 DIAGNOSIS — J019 Acute sinusitis, unspecified: Secondary | ICD-10-CM | POA: Diagnosis not present

## 2011-09-23 DIAGNOSIS — M5030 Other cervical disc degeneration, unspecified cervical region: Secondary | ICD-10-CM | POA: Diagnosis not present

## 2011-09-23 DIAGNOSIS — M5380 Other specified dorsopathies, site unspecified: Secondary | ICD-10-CM | POA: Diagnosis not present

## 2011-09-23 DIAGNOSIS — M5412 Radiculopathy, cervical region: Secondary | ICD-10-CM | POA: Diagnosis not present

## 2011-10-01 DIAGNOSIS — H02109 Unspecified ectropion of unspecified eye, unspecified eyelid: Secondary | ICD-10-CM | POA: Diagnosis not present

## 2011-11-04 DIAGNOSIS — S96819A Strain of other specified muscles and tendons at ankle and foot level, unspecified foot, initial encounter: Secondary | ICD-10-CM | POA: Diagnosis not present

## 2011-11-04 DIAGNOSIS — S93499A Sprain of other ligament of unspecified ankle, initial encounter: Secondary | ICD-10-CM | POA: Diagnosis not present

## 2011-11-11 DIAGNOSIS — M5137 Other intervertebral disc degeneration, lumbosacral region: Secondary | ICD-10-CM | POA: Diagnosis not present

## 2011-11-11 DIAGNOSIS — M47812 Spondylosis without myelopathy or radiculopathy, cervical region: Secondary | ICD-10-CM | POA: Diagnosis not present

## 2011-11-11 DIAGNOSIS — Z981 Arthrodesis status: Secondary | ICD-10-CM | POA: Diagnosis not present

## 2011-11-11 DIAGNOSIS — Z09 Encounter for follow-up examination after completed treatment for conditions other than malignant neoplasm: Secondary | ICD-10-CM | POA: Diagnosis not present

## 2011-11-22 DIAGNOSIS — M19079 Primary osteoarthritis, unspecified ankle and foot: Secondary | ICD-10-CM | POA: Diagnosis not present

## 2011-11-29 DIAGNOSIS — R269 Unspecified abnormalities of gait and mobility: Secondary | ICD-10-CM | POA: Diagnosis not present

## 2011-11-29 DIAGNOSIS — IMO0002 Reserved for concepts with insufficient information to code with codable children: Secondary | ICD-10-CM | POA: Diagnosis not present

## 2011-11-29 DIAGNOSIS — G43909 Migraine, unspecified, not intractable, without status migrainosus: Secondary | ICD-10-CM | POA: Diagnosis not present

## 2011-12-09 DIAGNOSIS — J209 Acute bronchitis, unspecified: Secondary | ICD-10-CM | POA: Diagnosis not present

## 2011-12-21 DIAGNOSIS — M810 Age-related osteoporosis without current pathological fracture: Secondary | ICD-10-CM | POA: Diagnosis not present

## 2011-12-21 DIAGNOSIS — J309 Allergic rhinitis, unspecified: Secondary | ICD-10-CM | POA: Diagnosis not present

## 2011-12-21 DIAGNOSIS — Z0000 Encounter for general adult medical examination without abnormal findings: Secondary | ICD-10-CM | POA: Diagnosis not present

## 2011-12-21 DIAGNOSIS — K219 Gastro-esophageal reflux disease without esophagitis: Secondary | ICD-10-CM | POA: Diagnosis not present

## 2011-12-23 ENCOUNTER — Other Ambulatory Visit: Payer: Self-pay | Admitting: Obstetrics & Gynecology

## 2011-12-23 DIAGNOSIS — Z1231 Encounter for screening mammogram for malignant neoplasm of breast: Secondary | ICD-10-CM

## 2011-12-26 ENCOUNTER — Emergency Department (HOSPITAL_COMMUNITY): Payer: BC Managed Care – PPO

## 2011-12-26 ENCOUNTER — Emergency Department (HOSPITAL_COMMUNITY)
Admission: EM | Admit: 2011-12-26 | Discharge: 2011-12-26 | Disposition: A | Discharge status: Home / Self Care | Payer: BC Managed Care – PPO | Attending: Emergency Medicine | Admitting: Emergency Medicine

## 2011-12-26 ENCOUNTER — Encounter (HOSPITAL_COMMUNITY): Payer: Self-pay | Admitting: Emergency Medicine

## 2011-12-26 DIAGNOSIS — E876 Hypokalemia: Secondary | ICD-10-CM | POA: Diagnosis not present

## 2011-12-26 DIAGNOSIS — Z79899 Other long term (current) drug therapy: Secondary | ICD-10-CM | POA: Insufficient documentation

## 2011-12-26 DIAGNOSIS — Z0389 Encounter for observation for other suspected diseases and conditions ruled out: Secondary | ICD-10-CM | POA: Diagnosis not present

## 2011-12-26 DIAGNOSIS — G43909 Migraine, unspecified, not intractable, without status migrainosus: Secondary | ICD-10-CM | POA: Diagnosis not present

## 2011-12-26 LAB — BASIC METABOLIC PANEL
CO2: 23 mEq/L (ref 19–32)
Calcium: 9.2 mg/dL (ref 8.4–10.5)
Creatinine, Ser: 0.64 mg/dL (ref 0.50–1.10)
GFR calc non Af Amer: 90 mL/min — ABNORMAL LOW (ref >90)
Glucose, Bld: 76 mg/dL (ref 70–99)

## 2011-12-26 MED ORDER — DEXAMETHASONE SODIUM PHOSPHATE 10 MG/ML IJ SOLN
10.0000 mg | Freq: Once | INTRAMUSCULAR | Status: AC
  Administered 2011-12-26: 10 mg via INTRAVENOUS
  Filled 2011-12-26: qty 1

## 2011-12-26 MED ORDER — FENTANYL CITRATE 0.05 MG/ML IJ SOLN
50.0000 ug | Freq: Once | INTRAMUSCULAR | Status: AC
  Administered 2011-12-26: 50 ug via INTRAVENOUS
  Filled 2011-12-26: qty 2

## 2011-12-26 MED ORDER — POTASSIUM CHLORIDE CRYS ER 20 MEQ PO TBCR
40.0000 meq | EXTENDED_RELEASE_TABLET | Freq: Once | ORAL | Status: AC
  Administered 2011-12-26: 40 meq via ORAL
  Filled 2011-12-26: qty 2

## 2011-12-26 MED ORDER — DIPHENHYDRAMINE HCL 12.5 MG/5ML PO ELIX: 12.5000 mg | ORAL_SOLUTION | Freq: Every day | ORAL | Status: DC | PRN

## 2011-12-26 MED ORDER — DROPERIDOL 2.5 MG/ML IJ SOLN
2.5000 mg | Freq: Once | INTRAMUSCULAR | Status: DC
  Filled 2011-12-26: qty 1

## 2011-12-26 MED ORDER — SODIUM CHLORIDE 0.9 % IV SOLN
INTRAVENOUS | Status: DC
  Administered 2011-12-26: 14:00:00 via INTRAVENOUS

## 2011-12-26 MED ORDER — SODIUM CHLORIDE 0.9 % IV BOLUS (SEPSIS)
500.0000 mL | Freq: Once | INTRAVENOUS | Status: AC
  Administered 2011-12-26: 500 mL via INTRAVENOUS

## 2011-12-26 MED ORDER — KETOROLAC TROMETHAMINE 30 MG/ML IJ SOLN
30.0000 mg | Freq: Once | INTRAMUSCULAR | Status: AC
  Administered 2011-12-26: 30 mg via INTRAVENOUS
  Filled 2011-12-26: qty 1

## 2011-12-26 NOTE — ED Provider Notes (Addendum)
History     CSN: [id number]  Arrival date & time 12/26/11  1210   First MD Initiated Contact with Patient 12/26/11 1239      Chief Complaint  Patient presents with  . Headache  . Chest Pain     HPI Pt presenting to ed with c/o headache pain onset this am. Pt states she also has chest pain she doesn't remember when it started. Pt also with c/o a "little numbness to her right arm. Pt states positive nausea no vomiting. Pt denies shortness of breath. Pt states this is the worse headache i have had.  Past Medical History  Diagnosis Date  . Back pain     Past Surgical History  Procedure Date  . Abdominal hysterectomy   . Back surgery   . Right knee     Family History  Problem Relation Age of Onset  . Cancer Father   . Migraines Sister     History  Substance Use Topics  . Smoking status: Never Smoker   . Smokeless tobacco: Not on file  . Alcohol Use: Yes     occassionally    OB History    Grav Para Term Preterm Abortions TAB SAB Ect Mult Living   2 2 2              Review of Systems All remaining review of systems negative Allergies  Clarithromycin; Codeine; Penicillins; Pregabalin; and Sulfonamide derivatives  Home Medications   Current Outpatient Rx  Name Route Sig Dispense Refill  . ACETAMINOPHEN 500 MG PO TABS Oral Take 1,000 mg by mouth every 6 (six) hours as needed. For pain    . AMITRIPTYLINE HCL 100 MG PO TABS Oral Take 100 mg by mouth at bedtime.    Marland Kitchen VITAMIN C 500 MG PO CAPS Oral Take 1 capsule by mouth daily.    Marland Kitchen VITAMIN D 1000 UNITS PO TABS Oral Take 1,000 Units by mouth daily.    Marland Kitchen FEXOFENADINE HCL 180 MG PO TABS Oral Take 180 mg by mouth daily.    . OMEGA-3 FATTY ACIDS 1000 MG PO CAPS Oral Take 1-2 g by mouth 2 (two) times daily. Patient takes 2 every morning and 1 every afternoon    . HYDROCODONE-ACETAMINOPHEN 5-325 MG PO TABS Oral Take 0.5 tablets by mouth every 6 (six) hours as needed. For pain    . MELOXICAM 7.5 MG PO TABS Oral Take 7.5 mg  by mouth 2 (two) times daily.    Marland Kitchen VITAMIN E 400 UNITS PO CAPS Oral Take 400 Units by mouth daily.      BP 117/55  Pulse 76  Temp(Src) 97.8 F (36.6 C) (Oral)  Resp 16  SpO2 99%  Physical Exam  Nursing note and vitals reviewed. Constitutional: She is oriented to person, place, and time. She appears well-developed and well-nourished. No distress.  HENT:  Head: Normocephalic and atraumatic.  Eyes: Pupils are equal, round, and reactive to light.  Neck: Normal range of motion.  Cardiovascular: Normal rate and intact distal pulses.         Date: 04/06/2012  Rate: 79  Rhythm: normal sinus rhythm  QRS Axis: normal  Intervals: normal  ST/T Wave abnormalities: normal  Conduction Disutrbances: none  Narrative Interpretation: unremarkable      Pulmonary/Chest: No respiratory distress.  Abdominal: Normal appearance. She exhibits no distension.  Musculoskeletal: Normal range of motion.  Neurological: She is alert and oriented to person, place, and time. She has normal strength. No cranial nerve deficit  or sensory deficit. GCS eye subscore is 4. GCS verbal subscore is 5. GCS motor subscore is 6.  Skin: Skin is warm and dry. No rash noted.  Psychiatric: She has a normal mood and affect. Her behavior is normal.    ED Course  Procedures (including critical care time) Scheduled Meds:    . fentaNYL  50 mcg Intravenous Once  . fentaNYL  50 mcg Intravenous Once  . ketorolac  30 mg Intravenous Once  . sodium chloride  500 mL Intravenous Once  . DISCONTD: droperidol  2.5 mg Intravenous Once   Continuous Infusions:    . sodium chloride 125 mL/hr at 12/26/11 1355   PRN Meds:.diphenhydrAMINE  Labs Reviewed  BASIC METABOLIC PANEL - Abnormal; Notable for the following:    Potassium 3.1 (*)    GFR calc non Af Amer 90 (*)    All other components within normal limits   No results found.   1. Migraine       MDM  After treatment in the ED the patient feels back to baseline and  wants to go home.         Nelia Shi, MD 01/03/12 1131  Nelia Shi, MD 04/06/12 [phone]

## 2011-12-26 NOTE — Discharge Instructions (Signed)
Foods Rich in Potassium Food / Potassium (mg)  Apricots, dried,  cup / 378 mg   Apricots, raw, 1 cup halves / 401 mg   Avocado,  / 487 mg   Banana, 1 large / 487 mg   Beef, lean, round, 3 oz / 202 mg   Cantaloupe, 1 cup cubes / 427 mg   Dates, medjool, 5 whole / 835 mg   Ham, cured, 3 oz / 212 mg   Lentils, dried,  cup / 458 mg   Lima beans, frozen,  cup / 258 mg   Orange, 1 large / 333 mg   Orange juice, 1 cup / 443 mg   Peaches, dried,  cup / 398 mg   Peas, split, cooked,  cup / 355 mg   Potato, boiled, 1 medium / 515 mg   Prunes, dried, uncooked,  cup / 318 mg   Raisins,  cup / 309 mg   Salmon, pink, raw, 3 oz / 275 mg   Sardines, canned , 3 oz / 338 mg   Tomato, raw, 1 medium / 292 mg   Tomato juice, 6 oz / 417 mg   Turkey, 3 oz / 349 mg  Document Released: 07/12/2005 Document Revised: 03/24/2011 Document Reviewed: 11/25/2008 Exit[employer] Patient Information 2012 [employer]. 

## 2011-12-26 NOTE — ED Notes (Signed)
Pt reports waking up with severe headache, dizziness with two falls, nausea.  Additionally pt reports falling on right hip with the first fall

## 2011-12-26 NOTE — ED Notes (Signed)
RN to obtain labs with start of IV 

## 2011-12-26 NOTE — ED Notes (Addendum)
Pt presenting to ed with c/o headache pain onset this am. Pt states she also has chest pain she doesn't remember when it started. Pt also with c/o a "little numbness to her right arm. Pt states positive nausea no vomiting. Pt denies shortness of breath. Pt states this is the worse headache i have had. Pt states "I don't think the chest pain or numbness have anything to do with my headache. Pt states she fell twice this am but did not hit her head pt denies blurred vision.

## 2011-12-28 ENCOUNTER — Telehealth: Payer: Self-pay | Admitting: Oncology

## 2011-12-28 NOTE — Telephone Encounter (Signed)
called pt scheduled appt for 06/17. will fax over letter to tDr. Mackenzie's office with appt d/t

## 2011-12-30 ENCOUNTER — Telehealth: Payer: Self-pay | Admitting: Oncology

## 2011-12-30 DIAGNOSIS — IMO0002 Reserved for concepts with insufficient information to code with codable children: Secondary | ICD-10-CM | POA: Diagnosis not present

## 2011-12-30 NOTE — Telephone Encounter (Signed)
Referred by Dr. Thea Silversmith Dx- Mild Leukopenia

## 2012-01-07 ENCOUNTER — Encounter: Payer: Self-pay | Admitting: Oncology

## 2012-01-07 DIAGNOSIS — D72819 Decreased white blood cell count, unspecified: Secondary | ICD-10-CM | POA: Insufficient documentation

## 2012-01-07 DIAGNOSIS — G43909 Migraine, unspecified, not intractable, without status migrainosus: Secondary | ICD-10-CM | POA: Insufficient documentation

## 2012-01-09 NOTE — Patient Instructions (Addendum)
A.  Issue:  Low white blood cell count:  Most likely benign.  This has been on going since 2008.  It has resolved today.  This is most likely benign.  I have low clinical suspicion for leukemia.    B.  Recommendation:   Once every 4 months blood count here at the [facility].  In the future, if you develop also anemia, and low platelet count, or significant decrease in white blood cell, I may consider bone marrow biopsy.  At this time, a bone marrow biopsy will be low yield in my opinion.   Return to clinic in about 1 year.

## 2012-01-10 ENCOUNTER — Ambulatory Visit (HOSPITAL_BASED_OUTPATIENT_CLINIC_OR_DEPARTMENT_OTHER): Payer: Medicare Other | Admitting: Oncology

## 2012-01-10 ENCOUNTER — Telehealth: Payer: Self-pay | Admitting: Oncology

## 2012-01-10 ENCOUNTER — Encounter: Payer: Self-pay | Admitting: Oncology

## 2012-01-10 ENCOUNTER — Other Ambulatory Visit (HOSPITAL_BASED_OUTPATIENT_CLINIC_OR_DEPARTMENT_OTHER): Payer: Medicare Other | Admitting: Lab

## 2012-01-10 ENCOUNTER — Ambulatory Visit: Payer: Medicare Other

## 2012-01-10 VITALS — BP 125/66 | HR 80 | Temp 97.7°F | Ht 66.5 in | Wt 137.3 lb

## 2012-01-10 DIAGNOSIS — G43909 Migraine, unspecified, not intractable, without status migrainosus: Secondary | ICD-10-CM

## 2012-01-10 DIAGNOSIS — D72819 Decreased white blood cell count, unspecified: Secondary | ICD-10-CM

## 2012-01-10 DIAGNOSIS — M549 Dorsalgia, unspecified: Secondary | ICD-10-CM

## 2012-01-10 LAB — CBC WITH DIFFERENTIAL/PLATELET
BASO%: 0.4 % (ref 0.0–2.0)
EOS%: 1.1 % (ref 0.0–7.0)
LYMPH%: 28.5 % (ref 14.0–49.7)
MCH: 31 pg (ref 25.1–34.0)
MCHC: 33.7 g/dL (ref 31.5–36.0)
MCV: 91.9 fL (ref 79.5–101.0)
MONO%: 7.9 % (ref 0.0–14.0)
Platelets: 195 10*3/uL (ref 145–400)
RBC: 4.66 10*6/uL (ref 3.70–5.45)
RDW: 15.1 % — ABNORMAL HIGH (ref 11.2–14.5)

## 2012-01-10 LAB — MORPHOLOGY

## 2012-01-10 NOTE — Telephone Encounter (Signed)
appts made and printed for pt aom °

## 2012-01-10 NOTE — Progress Notes (Signed)
[facility]  [phone] [phone]     INITIAL HEMATOLOGY CONSULTATION    Referral MD:   Dr. Thayer Headings, M.D.  Reason for Referral:  Intermittent leukopenia.     HPI: [name]. Schillo is a 66 year-old woman with fibromyalgia, migraine head ache, on Amitriptyline, chronic sinusitis.  She has had leukopenia going back as far as 2008.  On 10/04/2006, her WBC was 3.5; ANC 1.6; Hgb 13.7; Plt 207.  She recently switched primary care physician to  Dr. Thea Silversmith.  She obtained a CBC on 05/31/2011 with WBC 3.5, ANC 45%, hemoglobin 14.3, platelets 199. This was repeated on 12/06/2011 1 WBC was 3.1;  ANC was 37%, hemoglobin 13.7, platelets 173. Given her persistent leukocytosis, she was kindly referred to the cancer Center for evaluation.   Leslie Dixon presented to the clinic for the first time today with her husband. She reports a diffuse myalgia arthralgia for both a myalgia especially in her shoulders low back, hips, knees, and ankles.  She also has intermittent migraine headache when she feels stressed.  She had 3 episodes of sinus infection this year and has been evaluated by Dr. Jearld Fenton for possible surgical ENT intervention. She otherwise denies history of pneumonia, urinary tract infection, abscess, cellulitis, or infection.  She has mild fatigue; however, she is independent of all activities of daily living. She has intermittent diarrhea versus constipation which she attributes it to irritable bowel syndrome.   Patient denies fever, anorexia, weight loss, headache, visual changes, confusion, drenching night sweats, palpable lymph node swelling, mucositis, odynophagia, dysphagia, nausea vomiting, jaundice, chest pain, palpitation, shortness of breath, dyspnea on exertion, productive cough, gum bleeding, epistaxis, hematemesis, hemoptysis, abdominal pain, abdominal swelling, early satiety, melena, hematochezia, hematuria, skin rash, spontaneous bleeding,  joint swelling, joint pain, heat or cold intolerance, bowel bladder incontinence, back pain, focal motor weakness, paresthesia, depression, suicidal or homocidal ideation, feeling hopelessness.   Past Medical History  Diagnosis Date  . Back pain   . GERD (gastroesophageal reflux disease)   . Fibromyalgia   . Migraine   . Leukopenia   . Sinusitis     f/u with Dr. Jearld Fenton.   :    Past Surgical History  Procedure Date  . Total abdominal hysterectomy w/ bilateral salpingoophorectomy   . Right knee   . Lumbar/cervical surgeries   :   CURRENT MEDS: Current Outpatient Prescriptions  Medication Sig Dispense Refill  . amitriptyline (ELAVIL) 100 MG tablet Take 100 mg by mouth at bedtime.      . Aspirin-Caffeine 845-65 MG PACK Take by mouth as needed.      . cholecalciferol (VITAMIN D) 1000 UNITS tablet Take 1,000 Units by mouth daily.      . fexofenadine (ALLEGRA) 180 MG tablet Take 180 mg by mouth daily.      . fish oil-omega-3 fatty acids 1000 MG capsule Take 1-2 g by mouth 2 (two) times daily. Patient takes 2 every morning and 1 every afternoon      . HYDROcodone-acetaminophen (NORCO) 5-325 MG per tablet Take 0.5 tablets by mouth every 6 (six) hours as needed. For pain      . meloxicam (MOBIC) 7.5 MG tablet Take 7.5 mg by mouth 2 (two) times daily.      . vitamin E 400 UNIT capsule Take 400 Units by mouth daily.          Allergies  Allergen Reactions  . Clarithromycin     REACTION: GI  . Codeine  REACTION: nausea and vomiting  . Penicillins     REACTION: per allergy testing  . Pregabalin     REACTION: headache, several side effects  . Sulfonamide Derivatives     REACTION: Hives, wheezing  :  Family History  Problem Relation Age of Onset  . Cancer Father     Lung  . Migraines Sister   . Neurodegenerative disease Mother   . Cancer Brother     prostate  . Cancer Maternal Aunt     breast cancer  . Cancer Maternal Grandfather     Leukemia  :  History   Social  History  . Marital Status: Married    Spouse Name: N/A    Number of Children: 2  . Years of Education: N/A   Occupational History  .      retired Designer, jewellery   Social History Main Topics  . Smoking status: Never Smoker   . Smokeless tobacco: Never Used  . Alcohol Use: Yes     occassionally  . Drug Use: No  . Sexually Active:    Other Topics Concern  . Not on file   Social History Narrative  . No narrative on file  :  REVIEW OF SYSTEM:  The rest of the 14-point review of sytem was negative.   Exam: ECOG 1  General:  well-nourished woman, in no acute distress.  Eyes:  no scleral icterus.  ENT:  There were no oropharyngeal lesions.  Neck was without thyromegaly.  Lymphatics:  Negative cervical, supraclavicular or axillary adenopathy.  Respiratory: lungs were clear bilaterally without wheezing or crackles.  Cardiovascular:  Regular rate and rhythm, S1/S2, without murmur, rub or gallop.  There was no pedal edema.  GI:  abdomen was soft, flat, nontender, nondistended, without organomegaly.  Muscoloskeletal:  no spinal tenderness of palpation of vertebral spine.  Skin exam was without echymosis, petichae.  Neuro exam was nonfocal.  Patient was able to get on and off exam table without assistance.  Gait was normal.  Patient was alerted and oriented.  Attention was good.   Language was appropriate.  Mood was normal without depression.  Speech was not pressured.  Thought content was not tangential.    LABS:  Lab Results  Component Value Date   WBC 5.5 01/10/2012   HGB 14.4 01/10/2012   HCT 42.8 01/10/2012   PLT 195 01/10/2012   GLUCOSE 76 12/26/2011   NA 139 12/26/2011   K 3.1* 12/26/2011   CL 106 12/26/2011   CREATININE 0.64 12/26/2011   BUN 14 12/26/2011   CO2 23 12/26/2011     Blood smear review:   I personally reviewed the patient's peripheral blood smear today.  There was isocytosis.  There was no peripheral blast.  There was no schistocytosis, spherocytosis, target cell, rouleaux  formation, tear drop cell.  There was no giant platelets or platelet clumps.     ASSESSMENT AND PLAN:   1. Fibromyalgia: She is on Lortab per PCP as needed in addition to amitriptyline. She has been on amitriptyline for many years. 2. Intermittent migraine headache:  She has no Mobic when necessary. 3. Vitamin D deficiency: She is on vitamin D replacement. 4. Irritable bowel syndrome: She is on diet control as needed. She is on over-the-counter medications as needed. 5. Intermittent leukopenia:  - Differential diagnosis: Most likely benign. This is been ongoing since 2008.  I have very low clinical suspicion for myeloproliferative disease or acute leukemia or lymphoma.  I cannot for sure  rule out myelodysplastic syndrome (MDS). However her CBC to be normal today making it less likely to be MDS as this is more likely a progressive disease and not intermittent. That is a possibility of amitriptyline causing chronic leukopenia. However as this is intermittent and her CBC was normal today, I do not advocate changing her current dose of amitriptyline. - Work up: No indication for further workup at this time. A diagnostic bone marrow biopsy this would be a very low clinical utility. - Treatment: None needed as her CBC was normal today. - Followup:  Lab only appointment at the cancer Center in about 4 and than 8 months. We will see her about one year.  After 2 years of followup, if she has normal CBC or no significant worsening of her leukopenia, I may consider discharging her PCP.    Thank you for this referral.    The length of time of the face-to-face encounter was 30 minutes. More than 50% of time was spent counseling and coordination of care.

## 2012-01-11 LAB — ANA: Anti Nuclear Antibody(ANA): NEGATIVE

## 2012-01-18 DIAGNOSIS — H409 Unspecified glaucoma: Secondary | ICD-10-CM | POA: Diagnosis not present

## 2012-01-18 DIAGNOSIS — H4011X0 Primary open-angle glaucoma, stage unspecified: Secondary | ICD-10-CM | POA: Diagnosis not present

## 2012-01-26 ENCOUNTER — Ambulatory Visit
Admission: RE | Admit: 2012-01-26 | Discharge: 2012-01-26 | Disposition: A | Discharge status: Home / Self Care | Payer: Medicare Other | Source: Ambulatory Visit | Attending: Obstetrics & Gynecology | Admitting: Obstetrics & Gynecology

## 2012-01-26 ENCOUNTER — Ambulatory Visit: Payer: Medicare Other

## 2012-01-26 DIAGNOSIS — Z1231 Encounter for screening mammogram for malignant neoplasm of breast: Secondary | ICD-10-CM

## 2012-02-14 DIAGNOSIS — IMO0002 Reserved for concepts with insufficient information to code with codable children: Secondary | ICD-10-CM | POA: Diagnosis not present

## 2012-02-14 DIAGNOSIS — M47817 Spondylosis without myelopathy or radiculopathy, lumbosacral region: Secondary | ICD-10-CM | POA: Diagnosis not present

## 2012-02-14 DIAGNOSIS — G43909 Migraine, unspecified, not intractable, without status migrainosus: Secondary | ICD-10-CM | POA: Diagnosis not present

## 2012-02-21 DIAGNOSIS — R079 Chest pain, unspecified: Secondary | ICD-10-CM | POA: Diagnosis not present

## 2012-02-21 DIAGNOSIS — R002 Palpitations: Secondary | ICD-10-CM | POA: Diagnosis not present

## 2012-02-29 ENCOUNTER — Ambulatory Visit (HOSPITAL_COMMUNITY): Payer: BC Managed Care – PPO | Attending: Cardiology | Admitting: Radiology

## 2012-02-29 VITALS — BP 101/60 | Ht 66.5 in | Wt 139.0 lb

## 2012-02-29 DIAGNOSIS — I4892 Unspecified atrial flutter: Secondary | ICD-10-CM | POA: Diagnosis not present

## 2012-02-29 DIAGNOSIS — J45909 Unspecified asthma, uncomplicated: Secondary | ICD-10-CM | POA: Insufficient documentation

## 2012-02-29 DIAGNOSIS — R079 Chest pain, unspecified: Secondary | ICD-10-CM | POA: Insufficient documentation

## 2012-02-29 DIAGNOSIS — I4949 Other premature depolarization: Secondary | ICD-10-CM

## 2012-02-29 MED ORDER — TECHNETIUM TC 99M TETROFOSMIN IV KIT
30.0000 | PACK | Freq: Once | INTRAVENOUS | Status: AC | PRN
  Administered 2012-02-29: 30 via INTRAVENOUS

## 2012-02-29 MED ORDER — REGADENOSON 0.4 MG/5ML IV SOLN
0.4000 mg | Freq: Once | INTRAVENOUS | Status: AC
  Administered 2012-02-29: 0.4 mg via INTRAVENOUS

## 2012-02-29 MED ORDER — TECHNETIUM TC 99M TETROFOSMIN IV KIT
10.0000 | PACK | Freq: Once | INTRAVENOUS | Status: AC | PRN
  Administered 2012-02-29: 10 via INTRAVENOUS

## 2012-02-29 NOTE — Progress Notes (Signed)
[facility] SITE 3 NUCLEAR MED [address] [place] [address] [phone]  Cardiology Nuclear Med Study  Leslie Dixon is a 66 y.o. female     MRN : [id number]     DOB: 29-Jul-1945  Procedure Date: 02/29/2012  Nuclear Med Background Indication for Stress Test:  Evaluation for Ischemia History:  Asthma, AFLUTTER Cardiac Risk Factors: NA  Symptoms:  Chest Pain   Nuclear Pre-Procedure Caffeine/Decaff Intake:  None NPO After: 5:00pm   Lungs:  clear O2 Sat: 96% on room air. IV 0.9% NS with Angio Cath:  20g  IV Site: R Forearm  IV Started by:  Stanton Kidney, EMT-P  Chest Size (in):  38 Cup Size: B  Height: 5' 6.5" (1.689 m)  Weight:  139 lb (63.05 kg)  BMI:  Body mass index is 22.10 kg/(m^2). Tech Comments:  Pt advised Meds were  Not taken this am.    Nuclear Med Study 1 or 2 day study: 1 day  Stress Test Type:  Lexiscan  Reading MD: Cassell Clement, MD  Order Authorizing Provider:  Thayer Headings  Resting Radionuclide: Technetium 47m Tetrofosmin  Resting Radionuclide Dose: 11.0 mCi   Stress Radionuclide:  Technetium 71m Tetrofosmin  Stress Radionuclide Dose: 33.0 mCi           Stress Protocol Rest HR: 79 Stress HR: 106  Rest BP: 101/60 Stress BP: 116/46  Exercise Time (min): n/a METS: n/a   Predicted Max HR: 152 bpm % Max HR: 69.74 bpm Rate Pressure Product: 24401   Dose of Adenosine (mg):  n/a Dose of Lexiscan: 0.4 mg  Dose of Atropine (mg): n/a Dose of Dobutamine: n/a mcg/kg/min (at max HR)  Stress Test Technologist: Milana Na, EMT-P  Nuclear Technologist:  Doyne Keel, CNMT     Rest Procedure:  Myocardial perfusion imaging was performed at rest 45 minutes following the intravenous administration of Technetium 58m Tetrofosmin. Rest ECG: NSR - Normal EKG  Stress Procedure:  The patient received IV Lexiscan 0.4 mg over 15-seconds.  Technetium 68m Tetrofosmin injected at 30-seconds.  There were no significant changes, chest feels funny,  nausea, and rare pvcs with Lexiscan.  Quantitative spect images were obtained after a 45 minute delay. Stress ECG: No significant change from baseline ECG  QPS Raw Data Images:  Normal; no motion artifact; normal heart/lung ratio. Stress Images:  Normal homogeneous uptake in all areas of the myocardium. Rest Images:  Normal homogeneous uptake in all areas of the myocardium. Subtraction (SDS):  No evidence of ischemia. Transient Ischemic Dilatation (Normal <1.22):  1.13 Lung/Heart Ratio (Normal <0.45):  0.28  Quantitative Gated Spect Images QGS EDV:  64 ml QGS ESV:  17 ml  Impression Exercise Capacity:  Lexiscan with no exercise. BP Response:  Normal blood pressure response. Clinical Symptoms:  Mild chest pain/dyspnea. ECG Impression:  No significant ST segment change suggestive of ischemia. Comparison with Prior Nuclear Study: No previous nuclear study performed  Overall Impression:  Normal stress nuclear study.  LV Ejection Fraction: 74%.  LV Wall Motion:  NL LV Function; NL Wall Motion  [employer]

## 2012-03-01 ENCOUNTER — Encounter (HOSPITAL_COMMUNITY): Payer: Self-pay | Admitting: Internal Medicine

## 2012-03-09 ENCOUNTER — Encounter: Payer: BC Managed Care – PPO | Admitting: Nurse Practitioner

## 2012-03-09 DIAGNOSIS — M47812 Spondylosis without myelopathy or radiculopathy, cervical region: Secondary | ICD-10-CM | POA: Diagnosis not present

## 2012-03-09 DIAGNOSIS — Z09 Encounter for follow-up examination after completed treatment for conditions other than malignant neoplasm: Secondary | ICD-10-CM | POA: Diagnosis not present

## 2012-03-09 DIAGNOSIS — M5137 Other intervertebral disc degeneration, lumbosacral region: Secondary | ICD-10-CM | POA: Diagnosis not present

## 2012-03-09 DIAGNOSIS — Z981 Arthrodesis status: Secondary | ICD-10-CM | POA: Diagnosis not present

## 2012-03-14 DIAGNOSIS — H6980 Other specified disorders of Eustachian tube, unspecified ear: Secondary | ICD-10-CM | POA: Diagnosis not present

## 2012-03-14 DIAGNOSIS — J329 Chronic sinusitis, unspecified: Secondary | ICD-10-CM | POA: Diagnosis not present

## 2012-03-14 DIAGNOSIS — J309 Allergic rhinitis, unspecified: Secondary | ICD-10-CM | POA: Diagnosis not present

## 2012-03-22 DIAGNOSIS — N200 Calculus of kidney: Secondary | ICD-10-CM | POA: Diagnosis not present

## 2012-03-22 DIAGNOSIS — N1330 Unspecified hydronephrosis: Secondary | ICD-10-CM | POA: Diagnosis not present

## 2012-03-22 DIAGNOSIS — Q638 Other specified congenital malformations of kidney: Secondary | ICD-10-CM | POA: Diagnosis not present

## 2012-04-10 DIAGNOSIS — R1310 Dysphagia, unspecified: Secondary | ICD-10-CM | POA: Diagnosis not present

## 2012-04-24 DIAGNOSIS — Z23 Encounter for immunization: Secondary | ICD-10-CM | POA: Diagnosis not present

## 2012-04-24 DIAGNOSIS — R002 Palpitations: Secondary | ICD-10-CM | POA: Diagnosis not present

## 2012-05-04 DIAGNOSIS — D235 Other benign neoplasm of skin of trunk: Secondary | ICD-10-CM | POA: Diagnosis not present

## 2012-05-04 DIAGNOSIS — L918 Other hypertrophic disorders of the skin: Secondary | ICD-10-CM | POA: Diagnosis not present

## 2012-05-04 DIAGNOSIS — L908 Other atrophic disorders of skin: Secondary | ICD-10-CM | POA: Diagnosis not present

## 2012-05-09 ENCOUNTER — Other Ambulatory Visit (HOSPITAL_BASED_OUTPATIENT_CLINIC_OR_DEPARTMENT_OTHER): Payer: BC Managed Care – PPO | Admitting: Lab

## 2012-05-09 DIAGNOSIS — G43909 Migraine, unspecified, not intractable, without status migrainosus: Secondary | ICD-10-CM | POA: Diagnosis not present

## 2012-05-09 DIAGNOSIS — D72819 Decreased white blood cell count, unspecified: Secondary | ICD-10-CM | POA: Diagnosis not present

## 2012-05-09 DIAGNOSIS — M549 Dorsalgia, unspecified: Secondary | ICD-10-CM

## 2012-05-09 LAB — CBC WITH DIFFERENTIAL/PLATELET
EOS%: 5.5 % (ref 0.0–7.0)
Eosinophils Absolute: 0.2 10*3/uL (ref 0.0–0.5)
MCV: 92 fL (ref 79.5–101.0)
MONO%: 9.4 % (ref 0.0–14.0)
NEUT#: 1.4 10*3/uL — ABNORMAL LOW (ref 1.5–6.5)
RBC: 4.36 10*6/uL (ref 3.70–5.45)
RDW: 15 % — ABNORMAL HIGH (ref 11.2–14.5)
lymph#: 1.6 10*3/uL (ref 0.9–3.3)

## 2012-05-12 ENCOUNTER — Telehealth: Payer: Self-pay

## 2012-05-12 NOTE — Telephone Encounter (Signed)
Message copied by Kallie Locks on Fri May 12, 2012  3:52 PM ------      Message from: HA, Raliegh Ip T      Created: Thu May 11, 2012  1:23 PM       Please call pt.  Her WBC is low; but stable compared to 5 years ago.  Continue watchful observation at this time.  Thanks.

## 2012-05-15 ENCOUNTER — Telehealth: Payer: Self-pay | Admitting: *Deleted

## 2012-05-15 NOTE — Telephone Encounter (Signed)
Pt called back and I relayed Dr. Lodema Pilot message about her CBC, WBC low but stable and to continue observation.  Keep next lab appt in Feb.  Pt verbalized understanding.

## 2012-05-23 DIAGNOSIS — R1310 Dysphagia, unspecified: Secondary | ICD-10-CM | POA: Diagnosis not present

## 2012-06-01 DIAGNOSIS — M25569 Pain in unspecified knee: Secondary | ICD-10-CM | POA: Diagnosis not present

## 2012-06-14 DIAGNOSIS — R490 Dysphonia: Secondary | ICD-10-CM | POA: Diagnosis not present

## 2012-06-21 DIAGNOSIS — M5380 Other specified dorsopathies, site unspecified: Secondary | ICD-10-CM | POA: Diagnosis not present

## 2012-06-21 DIAGNOSIS — M5030 Other cervical disc degeneration, unspecified cervical region: Secondary | ICD-10-CM | POA: Diagnosis not present

## 2012-06-21 DIAGNOSIS — M5412 Radiculopathy, cervical region: Secondary | ICD-10-CM | POA: Diagnosis not present

## 2012-07-11 DIAGNOSIS — R269 Unspecified abnormalities of gait and mobility: Secondary | ICD-10-CM | POA: Diagnosis not present

## 2012-07-11 DIAGNOSIS — IMO0002 Reserved for concepts with insufficient information to code with codable children: Secondary | ICD-10-CM | POA: Diagnosis not present

## 2012-07-11 DIAGNOSIS — G43909 Migraine, unspecified, not intractable, without status migrainosus: Secondary | ICD-10-CM | POA: Diagnosis not present

## 2012-07-11 DIAGNOSIS — M47812 Spondylosis without myelopathy or radiculopathy, cervical region: Secondary | ICD-10-CM | POA: Diagnosis not present

## 2012-08-07 DIAGNOSIS — M810 Age-related osteoporosis without current pathological fracture: Secondary | ICD-10-CM | POA: Diagnosis not present

## 2012-08-07 DIAGNOSIS — IMO0001 Reserved for inherently not codable concepts without codable children: Secondary | ICD-10-CM | POA: Diagnosis not present

## 2012-08-07 DIAGNOSIS — J329 Chronic sinusitis, unspecified: Secondary | ICD-10-CM | POA: Diagnosis not present

## 2012-08-24 ENCOUNTER — Telehealth: Payer: Self-pay | Admitting: Oncology

## 2012-08-24 NOTE — Telephone Encounter (Signed)
pt called and needed to move lab.Marland KitchenMarland KitchenMarland KitchenDone

## 2012-08-31 DIAGNOSIS — J309 Allergic rhinitis, unspecified: Secondary | ICD-10-CM | POA: Diagnosis not present

## 2012-08-31 DIAGNOSIS — J9801 Acute bronchospasm: Secondary | ICD-10-CM | POA: Diagnosis not present

## 2012-09-05 ENCOUNTER — Other Ambulatory Visit: Payer: BC Managed Care – PPO | Admitting: Lab

## 2012-09-06 ENCOUNTER — Other Ambulatory Visit: Payer: BC Managed Care – PPO

## 2012-09-11 DIAGNOSIS — J320 Chronic maxillary sinusitis: Secondary | ICD-10-CM | POA: Diagnosis not present

## 2012-09-14 ENCOUNTER — Telehealth: Payer: Self-pay | Admitting: Oncology

## 2012-09-14 NOTE — Telephone Encounter (Signed)
pt called to r/s lab....Done °

## 2012-09-15 ENCOUNTER — Other Ambulatory Visit (HOSPITAL_BASED_OUTPATIENT_CLINIC_OR_DEPARTMENT_OTHER): Payer: BC Managed Care – PPO

## 2012-09-15 DIAGNOSIS — D72819 Decreased white blood cell count, unspecified: Secondary | ICD-10-CM | POA: Diagnosis not present

## 2012-09-15 DIAGNOSIS — M549 Dorsalgia, unspecified: Secondary | ICD-10-CM

## 2012-09-15 DIAGNOSIS — G43909 Migraine, unspecified, not intractable, without status migrainosus: Secondary | ICD-10-CM

## 2012-09-15 LAB — CBC WITH DIFFERENTIAL/PLATELET
Eosinophils Absolute: 0.2 10*3/uL (ref 0.0–0.5)
HGB: 13.4 g/dL (ref 11.6–15.9)
MONO#: 0.4 10*3/uL (ref 0.1–0.9)
NEUT#: 3.5 10*3/uL (ref 1.5–6.5)
RBC: 4.42 10*6/uL (ref 3.70–5.45)
RDW: 15.5 % — ABNORMAL HIGH (ref 11.2–14.5)
WBC: 5.8 10*3/uL (ref 3.9–10.3)

## 2012-09-18 ENCOUNTER — Telehealth: Payer: Self-pay

## 2012-09-18 NOTE — Telephone Encounter (Signed)
Message copied by Kallie Locks on Mon Sep 18, 2012  2:29 PM ------      Message from: Jethro Bolus T      Created: Sat Sep 16, 2012  7:01 PM       Please call pt.  Her WBC now is normal (no longer low like before).  This is most likely benign.  Continue observation.  Thanks. ------

## 2012-09-19 DIAGNOSIS — M25539 Pain in unspecified wrist: Secondary | ICD-10-CM | POA: Diagnosis not present

## 2012-10-13 DIAGNOSIS — J309 Allergic rhinitis, unspecified: Secondary | ICD-10-CM | POA: Diagnosis not present

## 2012-10-13 DIAGNOSIS — J019 Acute sinusitis, unspecified: Secondary | ICD-10-CM | POA: Diagnosis not present

## 2012-10-13 DIAGNOSIS — J370 Chronic laryngitis: Secondary | ICD-10-CM | POA: Diagnosis not present

## 2012-10-24 DIAGNOSIS — H2000 Unspecified acute and subacute iridocyclitis: Secondary | ICD-10-CM | POA: Diagnosis not present

## 2012-10-24 DIAGNOSIS — H25019 Cortical age-related cataract, unspecified eye: Secondary | ICD-10-CM | POA: Diagnosis not present

## 2012-10-24 DIAGNOSIS — H4011X0 Primary open-angle glaucoma, stage unspecified: Secondary | ICD-10-CM | POA: Diagnosis not present

## 2012-10-24 DIAGNOSIS — H2510 Age-related nuclear cataract, unspecified eye: Secondary | ICD-10-CM | POA: Diagnosis not present

## 2012-10-31 DIAGNOSIS — H40029 Open angle with borderline findings, high risk, unspecified eye: Secondary | ICD-10-CM | POA: Diagnosis not present

## 2012-10-31 DIAGNOSIS — H2510 Age-related nuclear cataract, unspecified eye: Secondary | ICD-10-CM | POA: Diagnosis not present

## 2012-10-31 DIAGNOSIS — H20029 Recurrent acute iridocyclitis, unspecified eye: Secondary | ICD-10-CM | POA: Diagnosis not present

## 2012-11-03 DIAGNOSIS — R269 Unspecified abnormalities of gait and mobility: Secondary | ICD-10-CM | POA: Diagnosis not present

## 2012-11-03 DIAGNOSIS — M47817 Spondylosis without myelopathy or radiculopathy, lumbosacral region: Secondary | ICD-10-CM | POA: Diagnosis not present

## 2012-11-03 DIAGNOSIS — IMO0002 Reserved for concepts with insufficient information to code with codable children: Secondary | ICD-10-CM | POA: Diagnosis not present

## 2012-11-15 DIAGNOSIS — H40029 Open angle with borderline findings, high risk, unspecified eye: Secondary | ICD-10-CM | POA: Diagnosis not present

## 2012-11-15 DIAGNOSIS — H20029 Recurrent acute iridocyclitis, unspecified eye: Secondary | ICD-10-CM | POA: Diagnosis not present

## 2012-11-15 DIAGNOSIS — H2510 Age-related nuclear cataract, unspecified eye: Secondary | ICD-10-CM | POA: Diagnosis not present

## 2012-11-15 DIAGNOSIS — H4011X0 Primary open-angle glaucoma, stage unspecified: Secondary | ICD-10-CM | POA: Diagnosis not present

## 2012-11-30 ENCOUNTER — Other Ambulatory Visit: Payer: Self-pay | Admitting: Internal Medicine

## 2012-11-30 DIAGNOSIS — R1011 Right upper quadrant pain: Secondary | ICD-10-CM

## 2012-12-01 ENCOUNTER — Ambulatory Visit
Admission: RE | Admit: 2012-12-01 | Discharge: 2012-12-01 | Disposition: A | Discharge status: Home / Self Care | Payer: BC Managed Care – PPO | Source: Ambulatory Visit | Attending: Internal Medicine | Admitting: Internal Medicine

## 2012-12-01 DIAGNOSIS — R1011 Right upper quadrant pain: Secondary | ICD-10-CM

## 2012-12-21 ENCOUNTER — Other Ambulatory Visit: Payer: Self-pay

## 2012-12-21 DIAGNOSIS — Z1231 Encounter for screening mammogram for malignant neoplasm of breast: Secondary | ICD-10-CM

## 2012-12-26 DIAGNOSIS — M79609 Pain in unspecified limb: Secondary | ICD-10-CM | POA: Diagnosis not present

## 2013-01-03 DIAGNOSIS — M79609 Pain in unspecified limb: Secondary | ICD-10-CM | POA: Diagnosis not present

## 2013-01-09 ENCOUNTER — Ambulatory Visit (HOSPITAL_BASED_OUTPATIENT_CLINIC_OR_DEPARTMENT_OTHER): Payer: BC Managed Care – PPO | Admitting: Oncology

## 2013-01-09 ENCOUNTER — Telehealth: Payer: Self-pay | Admitting: Oncology

## 2013-01-09 ENCOUNTER — Other Ambulatory Visit (HOSPITAL_BASED_OUTPATIENT_CLINIC_OR_DEPARTMENT_OTHER): Payer: BC Managed Care – PPO | Admitting: Lab

## 2013-01-09 ENCOUNTER — Encounter: Payer: Self-pay | Admitting: Oncology

## 2013-01-09 VITALS — BP 116/61 | HR 76 | Temp 97.2°F | Resp 18 | Ht 66.5 in | Wt 139.2 lb

## 2013-01-09 DIAGNOSIS — D72819 Decreased white blood cell count, unspecified: Secondary | ICD-10-CM

## 2013-01-09 DIAGNOSIS — M549 Dorsalgia, unspecified: Secondary | ICD-10-CM

## 2013-01-09 DIAGNOSIS — G43909 Migraine, unspecified, not intractable, without status migrainosus: Secondary | ICD-10-CM | POA: Diagnosis not present

## 2013-01-09 LAB — CBC WITH DIFFERENTIAL/PLATELET
Basophils Absolute: 0 10*3/uL (ref 0.0–0.1)
HCT: 40.1 % (ref 34.8–46.6)
HGB: 13.8 g/dL (ref 11.6–15.9)
MCH: 30.8 pg (ref 25.1–34.0)
MONO#: 0.4 10*3/uL (ref 0.1–0.9)
NEUT%: 57.4 % (ref 38.4–76.8)
Platelets: 158 10*3/uL (ref 145–400)
WBC: 3.7 10*3/uL — ABNORMAL LOW (ref 3.9–10.3)
lymph#: 1.1 10*3/uL (ref 0.9–3.3)

## 2013-01-09 LAB — COMPREHENSIVE METABOLIC PANEL (CC13)
ALT: 23 U/L (ref 0–55)
AST: 23 U/L (ref 5–34)
Albumin: 3.4 g/dL — ABNORMAL LOW (ref 3.5–5.0)
Alkaline Phosphatase: 65 U/L (ref 40–150)
Potassium: 3.5 mEq/L (ref 3.5–5.1)
Sodium: 139 mEq/L (ref 136–145)
Total Protein: 6.3 g/dL — ABNORMAL LOW (ref 6.4–8.3)

## 2013-01-09 LAB — MORPHOLOGY: PLT EST: ADEQUATE

## 2013-01-09 NOTE — Telephone Encounter (Signed)
gv and printed appt sched and avs for pt  °

## 2013-01-09 NOTE — Progress Notes (Signed)
[facility]  Telephone:([phone]) [phone] Fax:([phone]   OFFICE PROGRESS NOTE   Cc:  Thayer Headings, MD  DIAGNOSIS: Intermittent leukopenia  CURRENT THERAPY: Watchful observation  INTERVAL HISTORY: Leslie Dixon 67 y.o. female returns for routine followup with her husband.She reports a diffuse myalgia arthralgia for both a myalgia especially in her shoulders low back, hips, knees, and ankles. States that she may have to have surgery on her cervical spine in the near future. She also has intermittent migraine headache when she feels stressed. She has frequent sinus infections. She otherwise denies history of pneumonia, urinary tract infection, abscess, cellulitis, or infection. She has mild fatigue; however, she is independent of all activities of daily living. She has intermittent diarrhea versus constipation which she attributes it to irritable bowel syndrome.   Patient denies fever, anorexia, weight loss, headache, visual changes, confusion, drenching night sweats, palpable lymph node swelling, mucositis, odynophagia, dysphagia, nausea vomiting, jaundice, chest pain, palpitation, shortness of breath, dyspnea on exertion, productive cough, gum bleeding, epistaxis, hematemesis, hemoptysis, abdominal pain, abdominal swelling, early satiety, melena, hematochezia, hematuria, skin rash, spontaneous bleeding, joint swelling, joint pain, heat or cold intolerance, bowel bladder incontinence, back pain, focal motor weakness, paresthesia, depression, suicidal or homocidal ideation, feeling hopelessness.   Past Medical History  Diagnosis Date  . Back pain   . GERD (gastroesophageal reflux disease)   . Fibromyalgia   . Migraine   . Leukopenia   . Sinusitis     f/u with Dr. Jearld Fenton.     Past Surgical History  Procedure Laterality Date  . Total abdominal hysterectomy w/ bilateral salpingoophorectomy    . Right knee    . Lumbar/cervical surgeries      Current Outpatient  Prescriptions  Medication Sig Dispense Refill  . acetaminophen (TYLENOL) 500 MG tablet Take 500 mg by mouth every 6 (six) hours as needed for pain.      . mometasone (NASONEX) 50 MCG/ACT nasal spray Place 1 spray into the nose daily.      Marland Kitchen omeprazole (PRILOSEC) 20 MG capsule Take 20 mg by mouth daily.      . traMADol (ULTRAM) 50 MG tablet Take 50 mg by mouth 2 (two) times daily as needed for pain.      Marland Kitchen amitriptyline (ELAVIL) 100 MG tablet Take 100 mg by mouth at bedtime.      . cholecalciferol (VITAMIN D) 1000 UNITS tablet Take 1,000 Units by mouth daily.      . fexofenadine (ALLEGRA) 180 MG tablet Take 180 mg by mouth daily.      . fish oil-omega-3 fatty acids 1000 MG capsule Take 1-2 g by mouth 2 (two) times daily. Patient takes 2 every morning and 1 every afternoon      . meloxicam (MOBIC) 7.5 MG tablet Take 7.5 mg by mouth 2 (two) times daily.      . vitamin E 400 UNIT capsule Take 400 Units by mouth daily.       No current facility-administered medications for this visit.    ALLERGIES:  is allergic to clarithromycin; codeine; penicillins; pregabalin; and sulfonamide derivatives.  REVIEW OF SYSTEMS:  The rest of the 14-point review of system was negative.   Filed Vitals:   01/09/13 1104  BP: 116/61  Pulse: 76  Temp: 97.2 F (36.2 C)  Resp: 18   Wt Readings from Last 3 Encounters:  01/09/13 139 lb 3.2 oz (63.141 kg)  02/29/12 139 lb (63.05 kg)  01/10/12 137 lb 4.8 oz (62.279 kg)  ECOG Performance status: 1  PHYSICAL EXAMINATION:  General:  well-nourished in no acute distress.  Eyes:  no scleral icterus.  ENT:  There were no oropharyngeal lesions.  Neck was without thyromegaly.  Lymphatics:  Negative cervical, supraclavicular or axillary adenopathy.  Respiratory: lungs were clear bilaterally without wheezing or crackles.  Cardiovascular:  Regular rate and rhythm, S1/S2, without murmur, rub or gallop.  There was no pedal edema.  GI:  abdomen was soft, flat, nontender,  nondistended, without organomegaly.  Muscoloskeletal:  no spinal tenderness of palpation of vertebral spine.  Skin exam was without echymosis, petichae.  Neuro exam was nonfocal.  Patient was able to get on and off exam table without assistance.  Gait was normal.  Patient was alerted and oriented.  Attention was good.   Language was appropriate.  Mood was normal without depression.  Speech was not pressured.  Thought content was not tangential.     LABORATORY/RADIOLOGY DATA:  Lab Results  Component Value Date   WBC 3.7* 01/09/2013   HGB 13.8 01/09/2013   HCT 40.1 01/09/2013   PLT 158 01/09/2013   GLUCOSE 88 01/09/2013   ALKPHOS 65 01/09/2013   ALT 23 01/09/2013   AST 23 01/09/2013   NA 139 01/09/2013   K 3.5 01/09/2013   CL 107 01/09/2013   CREATININE 0.7 01/09/2013   BUN 16.1 01/09/2013   CO2 23 01/09/2013    ASSESSMENT AND PLAN:   1. Fibromyalgia: She is on tramadol per PCP as needed in addition to amitriptyline. She has been on amitriptyline for many years. 2. Intermittent migraine headache: She has no Mobic and Tylenol when necessary. 3. Vitamin D deficiency: She is on vitamin D replacement. 4. Irritable bowel syndrome: She is on diet control as needed. She is on over-the-counter medications as needed. 5. Intermittent leukopenia: - Differential diagnosis: Most likely benign. This is been ongoing since 2008. I have very low clinical suspicion for myeloproliferative disease or acute leukemia or lymphoma. I cannot for sure rule out myelodysplastic syndrome (MDS). Her CBC has been stable over the past year making it less likely to be MDS as this is more likely a progressive disease and not intermittent. That is a possibility of amitriptyline causing chronic leukopenia. However as this is intermittent and her CBC is stable, I do not advocate changing her current dose of amitriptyline.  - Work up: No indication for further workup at this time. A diagnostic bone marrow biopsy this would be a very low  clinical utility.  - Treatment: Watchful observation. - Followup: Lab only appointment at the cancer Center in about 6 months. We will see her about one year. If she has normal CBC or no significant worsening of her leukopenia, I may consider discharging her to her PCP after her next visit.    The length of time of the face-to-face encounter was 15 minutes. More than 50% of time was spent counseling and coordination of care.

## 2013-01-29 ENCOUNTER — Ambulatory Visit
Admission: RE | Admit: 2013-01-29 | Discharge: 2013-01-29 | Disposition: A | Discharge status: Home / Self Care | Payer: BC Managed Care – PPO | Source: Ambulatory Visit

## 2013-01-29 DIAGNOSIS — Z1231 Encounter for screening mammogram for malignant neoplasm of breast: Secondary | ICD-10-CM

## 2013-01-30 DIAGNOSIS — M549 Dorsalgia, unspecified: Secondary | ICD-10-CM | POA: Diagnosis not present

## 2013-01-30 DIAGNOSIS — M542 Cervicalgia: Secondary | ICD-10-CM | POA: Diagnosis not present

## 2013-02-01 ENCOUNTER — Other Ambulatory Visit: Payer: Self-pay | Admitting: Neurological Surgery

## 2013-02-01 DIAGNOSIS — M549 Dorsalgia, unspecified: Secondary | ICD-10-CM

## 2013-02-01 DIAGNOSIS — M542 Cervicalgia: Secondary | ICD-10-CM

## 2013-02-12 DIAGNOSIS — M47812 Spondylosis without myelopathy or radiculopathy, cervical region: Secondary | ICD-10-CM | POA: Diagnosis not present

## 2013-02-12 DIAGNOSIS — IMO0002 Reserved for concepts with insufficient information to code with codable children: Secondary | ICD-10-CM | POA: Diagnosis not present

## 2013-02-12 DIAGNOSIS — G43909 Migraine, unspecified, not intractable, without status migrainosus: Secondary | ICD-10-CM | POA: Diagnosis not present

## 2013-02-12 DIAGNOSIS — R269 Unspecified abnormalities of gait and mobility: Secondary | ICD-10-CM | POA: Diagnosis not present

## 2013-02-13 ENCOUNTER — Ambulatory Visit
Admission: RE | Admit: 2013-02-13 | Discharge: 2013-02-13 | Disposition: A | Discharge status: Home / Self Care | Payer: BC Managed Care – PPO | Source: Ambulatory Visit | Attending: Neurological Surgery | Admitting: Neurological Surgery

## 2013-02-13 DIAGNOSIS — M549 Dorsalgia, unspecified: Secondary | ICD-10-CM

## 2013-02-13 DIAGNOSIS — M542 Cervicalgia: Secondary | ICD-10-CM

## 2013-02-13 DIAGNOSIS — M5020 Other cervical disc displacement, unspecified cervical region: Secondary | ICD-10-CM | POA: Diagnosis not present

## 2013-02-13 DIAGNOSIS — M5126 Other intervertebral disc displacement, lumbar region: Secondary | ICD-10-CM | POA: Diagnosis not present

## 2013-02-15 DIAGNOSIS — M542 Cervicalgia: Secondary | ICD-10-CM | POA: Diagnosis not present

## 2013-02-15 DIAGNOSIS — M549 Dorsalgia, unspecified: Secondary | ICD-10-CM | POA: Diagnosis not present

## 2013-03-07 DIAGNOSIS — H9209 Otalgia, unspecified ear: Secondary | ICD-10-CM | POA: Diagnosis not present

## 2013-03-07 DIAGNOSIS — R498 Other voice and resonance disorders: Secondary | ICD-10-CM | POA: Diagnosis not present

## 2013-03-07 DIAGNOSIS — H905 Unspecified sensorineural hearing loss: Secondary | ICD-10-CM | POA: Diagnosis not present

## 2013-03-14 DIAGNOSIS — R209 Unspecified disturbances of skin sensation: Secondary | ICD-10-CM | POA: Diagnosis not present

## 2013-03-14 DIAGNOSIS — M47812 Spondylosis without myelopathy or radiculopathy, cervical region: Secondary | ICD-10-CM | POA: Diagnosis not present

## 2013-03-14 DIAGNOSIS — M47817 Spondylosis without myelopathy or radiculopathy, lumbosacral region: Secondary | ICD-10-CM | POA: Diagnosis not present

## 2013-03-14 DIAGNOSIS — IMO0002 Reserved for concepts with insufficient information to code with codable children: Secondary | ICD-10-CM | POA: Diagnosis not present

## 2013-03-14 DIAGNOSIS — G43909 Migraine, unspecified, not intractable, without status migrainosus: Secondary | ICD-10-CM | POA: Diagnosis not present

## 2013-03-14 DIAGNOSIS — R269 Unspecified abnormalities of gait and mobility: Secondary | ICD-10-CM | POA: Diagnosis not present

## 2013-03-20 DIAGNOSIS — L908 Other atrophic disorders of skin: Secondary | ICD-10-CM | POA: Diagnosis not present

## 2013-03-20 DIAGNOSIS — D235 Other benign neoplasm of skin of trunk: Secondary | ICD-10-CM | POA: Diagnosis not present

## 2013-03-20 DIAGNOSIS — R69 Illness, unspecified: Secondary | ICD-10-CM | POA: Diagnosis not present

## 2013-03-21 DIAGNOSIS — Q638 Other specified congenital malformations of kidney: Secondary | ICD-10-CM | POA: Diagnosis not present

## 2013-03-21 DIAGNOSIS — N1330 Unspecified hydronephrosis: Secondary | ICD-10-CM | POA: Diagnosis not present

## 2013-03-21 DIAGNOSIS — N200 Calculus of kidney: Secondary | ICD-10-CM | POA: Diagnosis not present

## 2013-04-24 DIAGNOSIS — H6980 Other specified disorders of Eustachian tube, unspecified ear: Secondary | ICD-10-CM | POA: Diagnosis not present

## 2013-05-02 DIAGNOSIS — M19049 Primary osteoarthritis, unspecified hand: Secondary | ICD-10-CM | POA: Diagnosis not present

## 2013-05-04 DIAGNOSIS — M545 Low back pain, unspecified: Secondary | ICD-10-CM | POA: Diagnosis not present

## 2013-05-04 DIAGNOSIS — M542 Cervicalgia: Secondary | ICD-10-CM | POA: Diagnosis not present

## 2013-05-07 DIAGNOSIS — M542 Cervicalgia: Secondary | ICD-10-CM | POA: Diagnosis not present

## 2013-05-07 DIAGNOSIS — M545 Low back pain, unspecified: Secondary | ICD-10-CM | POA: Diagnosis not present

## 2013-05-10 DIAGNOSIS — M545 Low back pain, unspecified: Secondary | ICD-10-CM | POA: Diagnosis not present

## 2013-05-10 DIAGNOSIS — M542 Cervicalgia: Secondary | ICD-10-CM | POA: Diagnosis not present

## 2013-05-14 DIAGNOSIS — M545 Low back pain, unspecified: Secondary | ICD-10-CM | POA: Diagnosis not present

## 2013-05-14 DIAGNOSIS — M542 Cervicalgia: Secondary | ICD-10-CM | POA: Diagnosis not present

## 2013-05-17 DIAGNOSIS — M545 Low back pain, unspecified: Secondary | ICD-10-CM | POA: Diagnosis not present

## 2013-05-17 DIAGNOSIS — M542 Cervicalgia: Secondary | ICD-10-CM | POA: Diagnosis not present

## 2013-05-21 DIAGNOSIS — M545 Low back pain, unspecified: Secondary | ICD-10-CM | POA: Diagnosis not present

## 2013-05-21 DIAGNOSIS — M542 Cervicalgia: Secondary | ICD-10-CM | POA: Diagnosis not present

## 2013-05-24 DIAGNOSIS — M545 Low back pain, unspecified: Secondary | ICD-10-CM | POA: Diagnosis not present

## 2013-05-24 DIAGNOSIS — M542 Cervicalgia: Secondary | ICD-10-CM | POA: Diagnosis not present

## 2013-05-28 DIAGNOSIS — H6980 Other specified disorders of Eustachian tube, unspecified ear: Secondary | ICD-10-CM | POA: Diagnosis not present

## 2013-05-31 DIAGNOSIS — M545 Low back pain, unspecified: Secondary | ICD-10-CM | POA: Diagnosis not present

## 2013-05-31 DIAGNOSIS — M542 Cervicalgia: Secondary | ICD-10-CM | POA: Diagnosis not present

## 2013-07-02 ENCOUNTER — Other Ambulatory Visit: Payer: BC Managed Care – PPO

## 2013-07-05 ENCOUNTER — Other Ambulatory Visit (HOSPITAL_BASED_OUTPATIENT_CLINIC_OR_DEPARTMENT_OTHER): Payer: BC Managed Care – PPO

## 2013-07-05 DIAGNOSIS — D72819 Decreased white blood cell count, unspecified: Secondary | ICD-10-CM | POA: Diagnosis not present

## 2013-07-05 LAB — CBC WITH DIFFERENTIAL/PLATELET
Basophils Absolute: 0 10*3/uL (ref 0.0–0.1)
HCT: 40.4 % (ref 34.8–46.6)
HGB: 13.4 g/dL (ref 11.6–15.9)
MONO#: 0.3 10*3/uL (ref 0.1–0.9)
NEUT%: 40 % (ref 38.4–76.8)
Platelets: 176 10*3/uL (ref 145–400)
WBC: 3.6 10*3/uL — ABNORMAL LOW (ref 3.9–10.3)
lymph#: 1.7 10*3/uL (ref 0.9–3.3)

## 2013-07-24 ENCOUNTER — Telehealth: Payer: Self-pay | Admitting: Medical Oncology

## 2013-07-24 NOTE — Telephone Encounter (Signed)
Pt called for her CBC results from 07/05/13. She is not having any problems and is doing well. She asked if I can fax a copy to her primary care MD Dr. Link Snuffer. Results faxed. I asked her to call us if any problems and she voiced understanding.

## 2013-09-04 DIAGNOSIS — M25569 Pain in unspecified knee: Secondary | ICD-10-CM | POA: Diagnosis not present

## 2013-09-17 DIAGNOSIS — M545 Low back pain, unspecified: Secondary | ICD-10-CM | POA: Diagnosis not present

## 2013-09-19 DIAGNOSIS — R1011 Right upper quadrant pain: Secondary | ICD-10-CM | POA: Diagnosis not present

## 2013-09-19 DIAGNOSIS — Z87442 Personal history of urinary calculi: Secondary | ICD-10-CM | POA: Diagnosis not present

## 2013-09-19 DIAGNOSIS — Q638 Other specified congenital malformations of kidney: Secondary | ICD-10-CM | POA: Diagnosis not present

## 2013-10-09 ENCOUNTER — Other Ambulatory Visit (HOSPITAL_COMMUNITY): Payer: Self-pay | Admitting: Internal Medicine

## 2013-10-09 DIAGNOSIS — R1011 Right upper quadrant pain: Secondary | ICD-10-CM

## 2013-10-09 DIAGNOSIS — R109 Unspecified abdominal pain: Secondary | ICD-10-CM | POA: Diagnosis not present

## 2013-10-17 ENCOUNTER — Encounter (HOSPITAL_COMMUNITY)
Admission: RE | Admit: 2013-10-17 | Discharge: 2013-10-17 | Disposition: A | Discharge status: Home / Self Care | Payer: BC Managed Care – PPO | Source: Ambulatory Visit | Attending: Internal Medicine | Admitting: Internal Medicine

## 2013-10-17 DIAGNOSIS — R1011 Right upper quadrant pain: Secondary | ICD-10-CM | POA: Diagnosis not present

## 2013-10-17 MED ORDER — TECHNETIUM TC 99M MEBROFENIN IV KIT
5.0000 | PACK | Freq: Once | INTRAVENOUS | Status: AC | PRN
  Administered 2013-10-17: 5 via INTRAVENOUS

## 2013-10-22 DIAGNOSIS — H6980 Other specified disorders of Eustachian tube, unspecified ear: Secondary | ICD-10-CM | POA: Diagnosis not present

## 2013-10-22 DIAGNOSIS — J309 Allergic rhinitis, unspecified: Secondary | ICD-10-CM | POA: Diagnosis not present

## 2013-10-30 DIAGNOSIS — H40019 Open angle with borderline findings, low risk, unspecified eye: Secondary | ICD-10-CM | POA: Diagnosis not present

## 2013-10-30 DIAGNOSIS — H209 Unspecified iridocyclitis: Secondary | ICD-10-CM | POA: Diagnosis not present

## 2013-11-07 DIAGNOSIS — IMO0001 Reserved for inherently not codable concepts without codable children: Secondary | ICD-10-CM | POA: Diagnosis not present

## 2013-11-07 DIAGNOSIS — M810 Age-related osteoporosis without current pathological fracture: Secondary | ICD-10-CM | POA: Diagnosis not present

## 2013-11-07 DIAGNOSIS — K5900 Constipation, unspecified: Secondary | ICD-10-CM | POA: Diagnosis not present

## 2013-11-07 DIAGNOSIS — K219 Gastro-esophageal reflux disease without esophagitis: Secondary | ICD-10-CM | POA: Diagnosis not present

## 2013-11-07 DIAGNOSIS — R6881 Early satiety: Secondary | ICD-10-CM | POA: Diagnosis not present

## 2013-11-07 DIAGNOSIS — R1011 Right upper quadrant pain: Secondary | ICD-10-CM | POA: Diagnosis not present

## 2013-11-28 DIAGNOSIS — R1011 Right upper quadrant pain: Secondary | ICD-10-CM | POA: Diagnosis not present

## 2013-12-10 ENCOUNTER — Other Ambulatory Visit: Payer: Self-pay | Admitting: Obstetrics and Gynecology

## 2013-12-10 ENCOUNTER — Other Ambulatory Visit: Payer: Self-pay

## 2013-12-10 DIAGNOSIS — N63 Unspecified lump in unspecified breast: Secondary | ICD-10-CM | POA: Diagnosis not present

## 2013-12-10 DIAGNOSIS — Z01419 Encounter for gynecological examination (general) (routine) without abnormal findings: Secondary | ICD-10-CM | POA: Diagnosis not present

## 2013-12-10 DIAGNOSIS — Z1212 Encounter for screening for malignant neoplasm of rectum: Secondary | ICD-10-CM | POA: Diagnosis not present

## 2013-12-12 DIAGNOSIS — N959 Unspecified menopausal and perimenopausal disorder: Secondary | ICD-10-CM | POA: Diagnosis not present

## 2013-12-12 DIAGNOSIS — Z13820 Encounter for screening for osteoporosis: Secondary | ICD-10-CM | POA: Diagnosis not present

## 2013-12-12 DIAGNOSIS — M810 Age-related osteoporosis without current pathological fracture: Secondary | ICD-10-CM | POA: Diagnosis not present

## 2013-12-14 DIAGNOSIS — H20029 Recurrent acute iridocyclitis, unspecified eye: Secondary | ICD-10-CM | POA: Diagnosis not present

## 2013-12-21 ENCOUNTER — Ambulatory Visit
Admission: RE | Admit: 2013-12-21 | Discharge: 2013-12-21 | Disposition: A | Discharge status: Home / Self Care | Payer: BC Managed Care – PPO | Source: Ambulatory Visit | Attending: Obstetrics and Gynecology | Admitting: Obstetrics and Gynecology

## 2013-12-21 DIAGNOSIS — H20029 Recurrent acute iridocyclitis, unspecified eye: Secondary | ICD-10-CM | POA: Diagnosis not present

## 2013-12-21 DIAGNOSIS — N63 Unspecified lump in unspecified breast: Secondary | ICD-10-CM

## 2013-12-24 DIAGNOSIS — R1011 Right upper quadrant pain: Secondary | ICD-10-CM | POA: Diagnosis not present

## 2013-12-24 DIAGNOSIS — K219 Gastro-esophageal reflux disease without esophagitis: Secondary | ICD-10-CM | POA: Diagnosis not present

## 2014-01-01 ENCOUNTER — Ambulatory Visit (HOSPITAL_BASED_OUTPATIENT_CLINIC_OR_DEPARTMENT_OTHER): Payer: BC Managed Care – PPO | Admitting: Internal Medicine

## 2014-01-01 ENCOUNTER — Other Ambulatory Visit (HOSPITAL_BASED_OUTPATIENT_CLINIC_OR_DEPARTMENT_OTHER): Payer: BC Managed Care – PPO

## 2014-01-01 ENCOUNTER — Telehealth: Payer: Self-pay | Admitting: Internal Medicine

## 2014-01-01 VITALS — BP 107/56 | HR 75 | Temp 97.7°F | Resp 18 | Ht 66.0 in | Wt 136.5 lb

## 2014-01-01 DIAGNOSIS — D72819 Decreased white blood cell count, unspecified: Secondary | ICD-10-CM | POA: Diagnosis not present

## 2014-01-01 LAB — COMPREHENSIVE METABOLIC PANEL (CC13)
ALBUMIN: 3.8 g/dL (ref 3.5–5.0)
ALK PHOS: 68 U/L (ref 40–150)
ALT: 18 U/L (ref 0–55)
ANION GAP: 7 meq/L (ref 3–11)
AST: 19 U/L (ref 5–34)
BILIRUBIN TOTAL: 0.33 mg/dL (ref 0.20–1.20)
BUN: 16.7 mg/dL (ref 7.0–26.0)
CO2: 25 meq/L (ref 22–29)
Calcium: 9.1 mg/dL (ref 8.4–10.4)
Chloride: 109 mEq/L (ref 98–109)
Creatinine: 0.8 mg/dL (ref 0.6–1.1)
GLUCOSE: 76 mg/dL (ref 70–140)
POTASSIUM: 4.3 meq/L (ref 3.5–5.1)
SODIUM: 140 meq/L (ref 136–145)
TOTAL PROTEIN: 6.3 g/dL — AB (ref 6.4–8.3)

## 2014-01-01 LAB — CBC WITH DIFFERENTIAL/PLATELET
BASO%: 0.7 % (ref 0.0–2.0)
Basophils Absolute: 0 10*3/uL (ref 0.0–0.1)
EOS ABS: 0.1 10*3/uL (ref 0.0–0.5)
EOS%: 2.3 % (ref 0.0–7.0)
HCT: 42.5 % (ref 34.8–46.6)
HGB: 13.8 g/dL (ref 11.6–15.9)
LYMPH#: 1.7 10*3/uL (ref 0.9–3.3)
LYMPH%: 45.2 % (ref 14.0–49.7)
MCH: 29.6 pg (ref 25.1–34.0)
MCHC: 32.3 g/dL (ref 31.5–36.0)
MCV: 91.4 fL (ref 79.5–101.0)
MONO#: 0.3 10*3/uL (ref 0.1–0.9)
MONO%: 8.2 % (ref 0.0–14.0)
NEUT%: 43.6 % (ref 38.4–76.8)
NEUTROS ABS: 1.6 10*3/uL (ref 1.5–6.5)
Platelets: 179 10*3/uL (ref 145–400)
RBC: 4.65 10*6/uL (ref 3.70–5.45)
RDW: 15.8 % — AB (ref 11.2–14.5)
WBC: 3.8 10*3/uL — AB (ref 3.9–10.3)

## 2014-01-01 LAB — MORPHOLOGY: PLT EST: ADEQUATE

## 2014-01-01 LAB — CHCC SMEAR

## 2014-01-01 NOTE — Progress Notes (Signed)
[name], [name], [address] [place] [address]  DIAGNOSIS: Leukopenia - Plan: CBC with Differential, Comprehensive metabolic panel (Cmet) - [facility], Lactate dehydrogenase (LDH) - [facility]  Chief Complaint  Patient presents with  . leukopenia    CURRENT TREATMENT: Watchful observation.  INTERVAL HISTORY: Leslie Dixon 68 y.o. female with a history of chronic leukopenia is here for follow up.  She is receiving labs every 6 months.  Today, she reports having an ocular migraine coming on.  Otherwise, she denies any fevers or chills or acute shortness of breath. She no longer follows with Dr. Ray Church. She sees Dr. Philis Kendall with [facility].  She saw in him last October for a sinus infection. She completed a course of Augmentin with complete resolution.   She sees him next month for an annual physical exam.  She also has intermittent migraine headache when she feels stressed.   Patient denies fever, anorexia, weight loss, headache, visual changes, confusion, drenching night sweats, palpable lymph node swelling, mucositis, odynophagia, dysphagia, nausea vomiting, jaundice, chest pain, palpitation, shortness of breath, dyspnea on exertion, productive cough, gum bleeding, epistaxis, hematemesis, hemoptysis, abdominal pain, abdominal swelling, early satiety, melena, hematochezia, hematuria, skin rash, spontaneous bleeding, joint swelling, joint pain, heat or cold intolerance, bowel bladder incontinence, back pain, focal motor weakness, paresthesia, depression, suicidal or homocidal ideation, feeling hopelessness.   MEDICAL HISTORY: Past Medical History  Diagnosis Date  . Back pain   . GERD (gastroesophageal reflux disease)   . Fibromyalgia   . Migraine   . Leukopenia   . Sinusitis     f/u with Dr. Janace Hoard.     INTERIM HISTORY: has LACTOSE INTOLERANCE; HYPOKALEMIA; ANXIETY; COMMON MIGRAINE; OCULAR MIGRAINE; CARPAL TUNNEL  SYNDROME; ATRIAL FLUTTER; VARICOSE VEIN; ALLERGIC RHINITIS DUE TO POLLEN; ALLERGIC RHINITIS; VOCAL CORD DISORDER; ASTHMA; ASTHMA, WITH ACUTE EXACERBATION; GERD; OSTEOARTHRITIS; FIBROMYALGIA; OSTEOPOROSIS; NEPHROLITHIASIS, HX OF; Leukopenia; Migraine; and Back pain on her problem list.    ALLERGIES:  is allergic to clarithromycin; codeine; penicillins; pregabalin; and sulfonamide derivatives.  MEDICATIONS: has a current medication list which includes the following prescription(s): acetaminophen, amitriptyline, cholecalciferol, cyanocobalamin, fexofenadine, fish oil-omega-3 fatty acids, meloxicam, mometasone, omeprazole, tramadol, vitamin c, and vitamin e.  SURGICAL HISTORY:  Past Surgical History  Procedure Laterality Date  . Total abdominal hysterectomy w/ bilateral salpingoophorectomy    . Right knee    . Lumbar/cervical surgeries      REVIEW OF SYSTEMS:   Constitutional: Denies fevers, chills or abnormal weight loss Eyes: Denies blurriness of vision Ears, nose, mouth, throat, and face: Denies mucositis or sore throat Respiratory: Denies cough, dyspnea or wheezes Cardiovascular: Denies palpitation, chest discomfort or lower extremity swelling Gastrointestinal:  Denies nausea, heartburn or change in bowel habits Skin: Denies abnormal skin rashes Lymphatics: Denies new lymphadenopathy or easy bruising Neurological:Denies numbness, tingling or new weaknesses Behavioral/Psych: Mood is stable, no new changes  All other systems were reviewed with the patient and are negative.  PHYSICAL EXAMINATION: ECOG PERFORMANCE STATUS: 0 - Asymptomatic  Blood pressure 107/56, pulse 75, temperature 97.7 F (36.5 C), temperature source Oral, resp. rate 18, height $RemoveBe'5\' 6"'EhudfxgSt$  (1.676 m), weight 136 lb 8 oz (61.916 kg), SpO2 100.00%.  GENERAL:alert, no distress and comfortable; well developed and well nourished.  SKIN: skin color, texture, turgor are normal, no rashes or significant lesions EYES: normal,  Conjunctiva are pink and non-injected, sclera clear OROPHARYNX:no exudate, no erythema and lips, buccal mucosa, and tongue normal  NECK: supple, thyroid  normal size, non-tender, without nodularity LYMPH:  no palpable lymphadenopathy in the cervical, axillary or supraclavicular LUNGS: clear to auscultation with normal breathing effort, no wheezes or rhonchi HEART: regular rate & rhythm and no murmurs and no lower extremity edema ABDOMEN:abdomen soft, non-tender and normal bowel sounds Musculoskeletal:no cyanosis of digits and no clubbing  NEURO: alert & oriented x 3 with fluent speech, no focal motor/sensory deficits  Labs:  Lab Results  Component Value Date   WBC 3.8* 01/01/2014   HGB 13.8 01/01/2014   HCT 42.5 01/01/2014   MCV 91.4 01/01/2014   PLT 179 01/01/2014   NEUTROABS 1.6 01/01/2014      Chemistry      Component Value Date/Time   NA 140 01/01/2014 1009   NA 139 12/26/2011 1339   K 4.3 01/01/2014 1009   K 3.1* 12/26/2011 1339   CL 107 01/09/2013 1029   CL 106 12/26/2011 1339   CO2 25 01/01/2014 1009   CO2 23 12/26/2011 1339   BUN 16.7 01/01/2014 1009   BUN 14 12/26/2011 1339   CREATININE 0.8 01/01/2014 1009   CREATININE 0.64 12/26/2011 1339      Component Value Date/Time   CALCIUM 9.1 01/01/2014 1009   CALCIUM 9.2 12/26/2011 1339   ALKPHOS 68 01/01/2014 1009   AST 19 01/01/2014 1009   ALT 18 01/01/2014 1009   BILITOT 0.33 01/01/2014 1009       Basic Metabolic Panel:  Recent Labs Lab 01/01/14 1009  NA 140  K 4.3  CO2 25  GLUCOSE 76  BUN 16.7  CREATININE 0.8  CALCIUM 9.1   GFR Estimated Creatinine Clearance: 61.3 ml/min (by C-G formula based on Cr of 0.8). Liver Function Tests:  Recent Labs Lab 01/01/14 1009  AST 19  ALT 18  ALKPHOS 68  BILITOT 0.33  PROT 6.3*  ALBUMIN 3.8   No results found for this basename: LIPASE, AMYLASE,  in the last 168 hours No results found for this basename: AMMONIA,  in the last 168 hours Coagulation profile No results found for this basename: INR,  PROTIME,  in the last 168 hours  CBC:  Recent Labs Lab 01/01/14 1009  WBC 3.8*  NEUTROABS 1.6  HGB 13.8  HCT 42.5  MCV 91.4  PLT 179    Anemia work up No results found for this basename: VITAMINB12, FOLATE, FERRITIN, TIBC, IRON, RETICCTPCT,  in the last 72 hours  Studies:  No results found.   RADIOGRAPHIC STUDIES: Mm Digital Diagnostic Unilat R  12/21/2013   CLINICAL DATA:  Physician palpated right breast mass, 12 o'clock. The patient does not palpate the abnormality today.  EXAM: DIGITAL DIAGNOSTIC  RIGHT MAMMOGRAM WITH CAD  ULTRASOUND RIGHT BREAST  COMPARISON:  Multiple priors  ACR Breast Density Category c: The breast tissue is heterogeneously dense, which may obscure small masses.  FINDINGS: No mass or suspicious calcifications are seen in the right breast. Compared with priors.  Mammographic images were processed with CAD.  On physical exam, I palpate normal tissue in the upper central aspect of the right breast without palpable mass today.  Ultrasound is performed, showing normal tissue in the upper central aspect of the right breast.  IMPRESSION: No mammographic or sonographic evidence of malignancy, right breast.  RECOMMENDATION: Screening mammography in July, 2015.  I have discussed the findings and recommendations with the patient. Results were also provided in writing at the conclusion of the visit. If applicable, a reminder letter will be sent to the patient regarding the next  appointment.  BI-RADS CATEGORY  1: Negative.   Electronically Signed   By: Duke Salvia M.D.   On: 12/21/2013 11:32   US Breast Ltd Uni Right Inc Axilla  12/21/2013   CLINICAL DATA:  Physician palpated right breast mass, 12 o'clock. The patient does not palpate the abnormality today.  EXAM: DIGITAL DIAGNOSTIC  RIGHT MAMMOGRAM WITH CAD  ULTRASOUND RIGHT BREAST  COMPARISON:  Multiple priors  ACR Breast Density Category c: The breast tissue is heterogeneously dense, which may obscure small masses.   FINDINGS: No mass or suspicious calcifications are seen in the right breast. Compared with priors.  Mammographic images were processed with CAD.  On physical exam, I palpate normal tissue in the upper central aspect of the right breast without palpable mass today.  Ultrasound is performed, showing normal tissue in the upper central aspect of the right breast.  IMPRESSION: No mammographic or sonographic evidence of malignancy, right breast.  RECOMMENDATION: Screening mammography in July, 2015.  I have discussed the findings and recommendations with the patient. Results were also provided in writing at the conclusion of the visit. If applicable, a reminder letter will be sent to the patient regarding the next appointment.  BI-RADS CATEGORY  1: Negative.   Electronically Signed   By: Duke Salvia M.D.   On: 12/21/2013 11:32    Leslie Dixon 68 y.o. female with a history of Leukopenia - Plan: CBC with Differential, Comprehensive metabolic panel (Cmet) - [facility], Lactate dehydrogenase (LDH) - [facility]   PLAN:   1. Fibromyalgia: She is on tramadol per PCP as needed in addition to amitriptyline. She has been on amitriptyline for many years. 2. Intermittent migraine headache: She has no Mobic and Tylenol when necessary. 3. Vitamin D deficiency: She is on vitamin D replacement. 4. Irritable bowel syndrome: She is on diet control as needed. She is on over-the-counter medications as needed. 5. Intermittent leukopenia: - Differential diagnosis: Most likely benign. This is been ongoing since 2008. I have very low clinical suspicion for myeloproliferative disease or acute leukemia or lymphoma. I cannot for sure rule out myelodysplastic syndrome (MDS). Her CBC has been stable over the past year making it less likely to be MDS as this is more likely a progressive disease and not intermittent. That is a possibility of amitriptyline causing chronic leukopenia. However as this is intermittent and her CBC is  stable, I do not advocate changing her current dose of amitriptyline.  - Work up: No indication for further workup at this time. A diagnostic bone marrow biopsy this would be a very low clinical utility.  - Treatment: Watchful observation.  - Followup: Lab only appointment with her PCP in about 6 months. We will see her about one year. If she has normal CBC or no significant worsening of her leukopenia, I may consider discharging her to her PCP after her next visit.  All questions were answered. The patient knows to call the clinic with any problems, questions or concerns. We can certainly see the patient much sooner if necessary.  I spent 15 minutes counseling the patient face to face. The total time spent in the appointment was 25 minutes.    Concha Norway, MD 01/01/2014 11:55 AM

## 2014-01-01 NOTE — Telephone Encounter (Signed)
Damian Leavell pt appt for lab  and MD for 2016

## 2014-01-02 ENCOUNTER — Other Ambulatory Visit: Payer: Self-pay

## 2014-01-02 DIAGNOSIS — Z1231 Encounter for screening mammogram for malignant neoplasm of breast: Secondary | ICD-10-CM

## 2014-01-09 DIAGNOSIS — H20029 Recurrent acute iridocyclitis, unspecified eye: Secondary | ICD-10-CM | POA: Diagnosis not present

## 2014-01-11 ENCOUNTER — Other Ambulatory Visit: Payer: Self-pay | Admitting: Internal Medicine

## 2014-01-11 DIAGNOSIS — IMO0002 Reserved for concepts with insufficient information to code with codable children: Secondary | ICD-10-CM | POA: Diagnosis not present

## 2014-01-11 DIAGNOSIS — IMO0001 Reserved for inherently not codable concepts without codable children: Secondary | ICD-10-CM | POA: Diagnosis not present

## 2014-01-11 DIAGNOSIS — M545 Low back pain: Secondary | ICD-10-CM

## 2014-01-11 DIAGNOSIS — M5137 Other intervertebral disc degeneration, lumbosacral region: Secondary | ICD-10-CM | POA: Diagnosis not present

## 2014-01-16 ENCOUNTER — Ambulatory Visit
Admission: RE | Admit: 2014-01-16 | Discharge: 2014-01-16 | Disposition: A | Discharge status: Home / Self Care | Payer: BC Managed Care – PPO | Source: Ambulatory Visit | Attending: Internal Medicine | Admitting: Internal Medicine

## 2014-01-16 DIAGNOSIS — M545 Low back pain: Secondary | ICD-10-CM

## 2014-01-16 DIAGNOSIS — M5137 Other intervertebral disc degeneration, lumbosacral region: Secondary | ICD-10-CM | POA: Diagnosis not present

## 2014-01-18 DIAGNOSIS — H6980 Other specified disorders of Eustachian tube, unspecified ear: Secondary | ICD-10-CM | POA: Diagnosis not present

## 2014-01-18 DIAGNOSIS — H60509 Unspecified acute noninfective otitis externa, unspecified ear: Secondary | ICD-10-CM | POA: Diagnosis not present

## 2014-01-21 ENCOUNTER — Ambulatory Visit (INDEPENDENT_AMBULATORY_CARE_PROVIDER_SITE_OTHER): Payer: BC Managed Care – PPO | Admitting: Surgery

## 2014-01-21 VITALS — BP 116/70 | HR 71 | Temp 97.3°F | Resp 16 | Ht 66.5 in | Wt 136.0 lb

## 2014-01-21 DIAGNOSIS — G8929 Other chronic pain: Secondary | ICD-10-CM | POA: Diagnosis not present

## 2014-01-21 DIAGNOSIS — R1011 Right upper quadrant pain: Secondary | ICD-10-CM

## 2014-01-21 NOTE — Patient Instructions (Signed)

## 2014-01-21 NOTE — Progress Notes (Signed)
Chief Complaint:  persistant right upper quadrant pain  History of Present Illness:  Leslie Dixon is an 68 y.o. female referred by Dr. Oletta Lamas for cholecystectomy for chronic recurrent right upper quadrant pain. This is postprandial pain associated with numerous different foods. She's had a normal upper endoscopy and normal gallbladder ultrasound. She has an ejection fraction of 36%. He has a family history of unrecognized gallbladder disease.    I explained the procedure to her in detail including complications not limited to common bile duct injuries bile leaks or bowel injuries. She would like to have relief of this pain. I indicated I couldn't guarantee that this will limit her pain she would like to go ahead with having her gallbladder removed. She is followed up by Dr. Allie Bossier at [facility].    Past Medical History  Diagnosis Date  . Back pain   . GERD (gastroesophageal reflux disease)   . Fibromyalgia   . Migraine   . Leukopenia   . Sinusitis     f/u with Dr. Janace Hoard.     Past Surgical History  Procedure Laterality Date  . Total abdominal hysterectomy w/ bilateral salpingoophorectomy    . Right knee    . Lumbar/cervical surgeries      Current Outpatient Prescriptions  Medication Sig Dispense Refill  . acetaminophen (TYLENOL) 500 MG tablet Take 500 mg by mouth every 6 (six) hours as needed for pain.      Marland Kitchen amitriptyline (ELAVIL) 100 MG tablet Take 100 mg by mouth at bedtime.      . cholecalciferol (VITAMIN D) 1000 UNITS tablet Take 1,000 Units by mouth daily.      . Cyanocobalamin (VITAMIN B 12 PO) Take by mouth.      . fexofenadine (ALLEGRA) 180 MG tablet Take 180 mg by mouth daily.      . fish oil-omega-3 fatty acids 1000 MG capsule Take 1-2 g by mouth 2 (two) times daily. Patient takes 2 every morning and 1 every afternoon      . meloxicam (MOBIC) 7.5 MG tablet Take 7.5 mg by mouth 2 (two) times daily.      . mometasone (NASONEX) 50 MCG/ACT nasal spray Place 1 spray  into the nose daily.      Marland Kitchen omeprazole (PRILOSEC) 20 MG capsule Take 20 mg by mouth daily.      . traMADol (ULTRAM) 50 MG tablet Take 50 mg by mouth 2 (two) times daily as needed for pain.      . vitamin C (ASCORBIC ACID) 500 MG tablet Take 500 mg by mouth daily.      . vitamin E 400 UNIT capsule Take 400 Units by mouth daily.       No current facility-administered medications for this visit.   Clarithromycin; Codeine; Penicillins; Pregabalin; and Sulfonamide derivatives Family History  Problem Relation Age of Onset  . Cancer Father     Lung  . Migraines Sister   . Neurodegenerative disease Mother   . Cancer Brother     prostate  . Cancer Maternal Aunt     breast cancer  . Cancer Maternal Grandfather     Leukemia   Social History:   reports that she has never smoked. She has never used smokeless tobacco. She reports that she drinks alcohol. She reports that she does not use illicit drugs.   REVIEW OF SYSTEMS : Positive for fibromyalgia and postprandial abdominal pain mainly on the right side. ; otherwise negative  Physical Exam:   Blood pressure  116/70, pulse 71, temperature 97.3 F (36.3 C), temperature source Temporal, resp. rate 16, height 5' 6.5" (1.689 m), weight 136 lb (61.689 kg). Body mass index is 21.62 kg/(m^2).  Gen:  WDWN white female NAD  Neurological: Alert and oriented to person, place, and time. Motor and sensory function is grossly intact  Head: Normocephalic and atraumatic.  Eyes: Conjunctivae are normal. Pupils are equal, round, and reactive to light. No scleral icterus.  Neck: Normal range of motion. Neck supple. No tracheal deviation or thyromegaly present.  Cardiovascular:  SR without murmurs or gallops.  No carotid bruits Breast:  Not examined Respiratory: Effort normal.  No respiratory distress. No chest wall tenderness. Breath sounds normal.  No wheezes, rales or rhonchi.  Abdomen:  No active pain at the present time. GU:  Not  examined Musculoskeletal: Normal range of motion. Extremities are nontender. No cyanosis, edema or clubbing noted Lymphadenopathy: No cervical, preauricular, postauricular or axillary adenopathy is present Skin: Skin is warm and dry. No rash noted. No diaphoresis. No erythema. No pallor. Pscyh: Normal mood and affect. Behavior is normal. Judgment and thought content normal.   LABORATORY RESULTS: No results found for this or any previous visit (from the past 48 hour(s)).   RADIOLOGY RESULTS: No results found.  Problem List: Patient Active Problem List   Diagnosis Date Noted  . Leukopenia   . Migraine   . Back pain   . HYPOKALEMIA 01/03/2008  . ATRIAL FLUTTER 01/03/2008  . ALLERGIC RHINITIS DUE TO POLLEN 12/13/2007  . 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 ASTHMA, WITH ACUTE EXACERBATION 04/24/2007  . LACTOSE INTOLERANCE 03/07/2007  . ANXIETY 03/07/2007  . COMMON MIGRAINE 03/07/2007  . OCULAR MIGRAINE 03/07/2007  . CARPAL TUNNEL SYNDROME 03/07/2007  . VARICOSE VEIN 03/07/2007  . ALLERGIC RHINITIS 03/07/2007  . ASTHMA 03/07/2007  . GERD 03/07/2007  . OSTEOARTHRITIS 03/07/2007  . FIBROMYALGIA 03/07/2007  . OSTEOPOROSIS 03/07/2007  . NEPHROLITHIASIS, HX OF 03/07/2007    Assessment & Plan: Upper quadrant pain despite negative workup. Patient has been referred by her gastroenterologist is likely her gallbladder. He completed we're missing small stones on her ultrasound have explained that this may not relieve her pain. She is ready to accept the risk and t have her gallbladder removed.    Matt B. Hassell Done, MD, [facility] [phone] beeper [phone]  01/21/2014 1:47 PM

## 2014-01-30 ENCOUNTER — Ambulatory Visit: Payer: BC Managed Care – PPO

## 2014-02-01 ENCOUNTER — Encounter (HOSPITAL_COMMUNITY): Payer: Self-pay | Admitting: Emergency Medicine

## 2014-02-01 ENCOUNTER — Emergency Department (HOSPITAL_COMMUNITY)
Admission: EM | Admit: 2014-02-01 | Discharge: 2014-02-01 | Disposition: A | Discharge status: Home / Self Care | Payer: BC Managed Care – PPO | Attending: Emergency Medicine | Admitting: Emergency Medicine

## 2014-02-01 ENCOUNTER — Emergency Department (HOSPITAL_COMMUNITY): Payer: BC Managed Care – PPO

## 2014-02-01 DIAGNOSIS — Z9889 Other specified postprocedural states: Secondary | ICD-10-CM | POA: Insufficient documentation

## 2014-02-01 DIAGNOSIS — M25551 Pain in right hip: Secondary | ICD-10-CM

## 2014-02-01 DIAGNOSIS — G8929 Other chronic pain: Secondary | ICD-10-CM

## 2014-02-01 DIAGNOSIS — Z8709 Personal history of other diseases of the respiratory system: Secondary | ICD-10-CM | POA: Insufficient documentation

## 2014-02-01 DIAGNOSIS — Y92009 Unspecified place in unspecified non-institutional (private) residence as the place of occurrence of the external cause: Secondary | ICD-10-CM | POA: Insufficient documentation

## 2014-02-01 DIAGNOSIS — G43909 Migraine, unspecified, not intractable, without status migrainosus: Secondary | ICD-10-CM | POA: Diagnosis not present

## 2014-02-01 DIAGNOSIS — M25559 Pain in unspecified hip: Secondary | ICD-10-CM | POA: Diagnosis not present

## 2014-02-01 DIAGNOSIS — IMO0001 Reserved for inherently not codable concepts without codable children: Secondary | ICD-10-CM | POA: Insufficient documentation

## 2014-02-01 DIAGNOSIS — S79919A Unspecified injury of unspecified hip, initial encounter: Secondary | ICD-10-CM | POA: Insufficient documentation

## 2014-02-01 DIAGNOSIS — R296 Repeated falls: Secondary | ICD-10-CM | POA: Insufficient documentation

## 2014-02-01 DIAGNOSIS — W19XXXA Unspecified fall, initial encounter: Secondary | ICD-10-CM

## 2014-02-01 DIAGNOSIS — K219 Gastro-esophageal reflux disease without esophagitis: Secondary | ICD-10-CM | POA: Diagnosis not present

## 2014-02-01 DIAGNOSIS — Z880 Allergy status to penicillin: Secondary | ICD-10-CM | POA: Insufficient documentation

## 2014-02-01 DIAGNOSIS — Z791 Long term (current) use of non-steroidal anti-inflammatories (NSAID): Secondary | ICD-10-CM | POA: Insufficient documentation

## 2014-02-01 DIAGNOSIS — Z79899 Other long term (current) drug therapy: Secondary | ICD-10-CM | POA: Insufficient documentation

## 2014-02-01 DIAGNOSIS — S79929A Unspecified injury of unspecified thigh, initial encounter: Secondary | ICD-10-CM | POA: Diagnosis not present

## 2014-02-01 DIAGNOSIS — Z862 Personal history of diseases of the blood and blood-forming organs and certain disorders involving the immune mechanism: Secondary | ICD-10-CM | POA: Insufficient documentation

## 2014-02-01 DIAGNOSIS — Y9389 Activity, other specified: Secondary | ICD-10-CM | POA: Insufficient documentation

## 2014-02-01 DIAGNOSIS — M549 Dorsalgia, unspecified: Secondary | ICD-10-CM

## 2014-02-01 DIAGNOSIS — IMO0002 Reserved for concepts with insufficient information to code with codable children: Secondary | ICD-10-CM | POA: Insufficient documentation

## 2014-02-01 HISTORY — DX: Dorsalgia, unspecified: M54.9

## 2014-02-01 HISTORY — DX: Sciatica, right side: M54.31

## 2014-02-01 HISTORY — DX: Other chronic pain: G89.29

## 2014-02-01 MED ORDER — TRAMADOL HCL 50 MG PO TABS
50.0000 mg | ORAL_TABLET | Freq: Once | ORAL | Status: AC
  Administered 2014-02-01: 50 mg via ORAL
  Filled 2014-02-01: qty 1

## 2014-02-01 MED ORDER — TRAMADOL HCL 50 MG PO TABS: 50.0000 mg | ORAL_TABLET | Freq: Four times a day (QID) | ORAL | Status: DC | PRN

## 2014-02-01 MED ORDER — HYDROCODONE-ACETAMINOPHEN 5-325 MG PO TABS: 1.0000 | ORAL_TABLET | Freq: Once | ORAL | Status: DC

## 2014-02-01 MED ORDER — METHOCARBAMOL 500 MG PO TABS: 500.0000 mg | ORAL_TABLET | Freq: Two times a day (BID) | ORAL | Status: DC | PRN

## 2014-02-01 NOTE — ED Notes (Signed)
Per EMS pt coming from home with c/o fall this morning and right hip pain. EMS sts no deformity noted. Pain is 10/10.

## 2014-02-01 NOTE — Discharge Instructions (Signed)
°Emergency Department Resource Guide °1) Find a Doctor and Pay Out of Pocket °Although you won't have to find out who is covered by your insurance plan, it is a good idea to ask around and get recommendations. You will then need to call the office and see if the doctor you have chosen will accept you as a new patient and what types of options they offer for patients who are self-pay. Some doctors offer discounts or will set up payment plans for their patients who do not have insurance, but you will need to ask so you aren't surprised when you get to your appointment. ° °2) Contact Your Local Health Department °Not all health departments have doctors that can see patients for sick visits, but many do, so it is worth a call to see if yours does. If you don't know where your local health department is, you can check in your phone book. The CDC also has a tool to help you locate your state's health department, and many state websites also have listings of all of their local health departments. ° °3) Find a Walk-in Clinic °If your illness is not likely to be very severe or complicated, you may want to try a walk in clinic. These are popping up all over the country in pharmacies, drugstores, and shopping centers. They're usually staffed by nurse practitioners or physician assistants that have been trained to treat common illnesses and complaints. They're usually fairly quick and inexpensive. However, if you have serious medical issues or chronic medical problems, these are probably not your best option. ° °No Primary Care Doctor: °- Call [facility] at  832-8000 - they can help you locate a primary care doctor that  accepts your insurance, provides certain services, etc. °- Physician Referral Service- 1-800-533-3463 ° °Chronic Pain Problems: °Organization         Address  Phone   Notes  °[facility]  (336) 297-2271 Patients need to be referred by their primary care doctor.  ° °Medication  Assistance: °Organization         Address  Phone   Notes  °[place] [facility] [address] °[place], [place] [address] (336) 641-8030 --Must be a resident of [place] °-- Must have NO insurance coverage whatsoever (no Medicaid/ Medicare, etc.) °-- The pt. MUST have a primary care doctor that directs their care regularly and follows them in the community °  °[facility]  (866) 331-1348   °[employer]  (888) 892-1162   ° °Agencies that provide inexpensive medical care: °Organization         Address  Phone   Notes  °[facility]  (336) 832-8035   °[facility]    (336) 832-7272   °[facility] [address] °[place], [place] [address] (336) 832-4777   °[facility] [address], °Portal (336) 271-4999   °[facility]    (336) 373-0678   °[facility]    (336) 272-1050   °[facility] ° [address], [place] Phone:  (336) 832-4444, Fax:  (336) 832-4440 Hours of Operation:  9 am - 6 pm, M-F.  Also accepts Medicaid/Medicare and self-pay.  ° [facility] ° [address], [place] Phone: (336) 832-3150, Fax: (336) 832-3151. Hours of Operation:  8:30 am - 5:30 pm, M-F.  Also accepts Medicaid and self-pay.  °[facility] [place] [address], [place] Phone: (336) 878-6027   °[facility] [address], [place], [place] (336)723-1848, Ext. 123 Mondays & Thursdays: 7-9 AM.  First 15 patients are seen on a first come, first serve basis. °  ° °Medicaid-accepting [place] Providers: ° °Organization         Address  Phone   Notes  °[facility] [address], [place] (336) 641-2100 Also accepts self-pay patients.  °[facility] [address], [place] ° (336) 856-9996   °[facility] [address], [place]  (336) 288-8857   °[facility] [address] [place] [address], [place] (336) 299-7000   °Veita Bland [address], [place]  ° (336) 373-1557 Only accepts College Access Medicaid patients after they have their name applied to their card.  ° °Self-Pay (no insurance) in [place]: ° °Organization         Address  Phone   Notes  °Sickle Cell Patients, [facility] [address], [place] (336) 832-1970   °[facility] [address], [place] (336) 832-4400   °[facility] ° [address], [place] (336) 992-4800   °[facility]/Dr. Osei-Bonsu ° [address] [place] [address], [place] or [address], [place] (336) 841-8500 Phone number for both [place] and [place] locations is the same.  °[facility] [address], [place] (336) 299-0000   °[facility] [address] [place] [address], [place] or [address] (336) 852-7530 °(336) 878-2260   °[facility] [address], [place] (336) 350-1642, phone; (336) 294-5005, fax Sees patients 1st and 3rd Saturday of every month.  Must not qualify for public or private insurance (i.e. Medicaid, Medicare, [place] Health Choice, Veterans' Benefits) • Household income should be no more than 200% of the poverty level •The clinic cannot treat you if you are pregnant or think you are pregnant • Sexually transmitted diseases are not treated at the clinic.  ° ° °Dental Care: °Organization         Address  Phone  Notes  °[place] [facility] [address],  (336) 641-6152 Accepts children up to age 21 who are enrolled in Medicaid or [place] Health Choice; pregnant women with a Medicaid card; and children who have applied for Medicaid or [place] Health Choice, but were declined, whose parents can pay a reduced fee at time of service.  °[place]  [facility] [place]  [address], [place] (336) 641-7733 Accepts children up to age 21 who are enrolled in Medicaid or [place] Health Choice; pregnant women with a Medicaid card; and children who have applied for Medicaid or [place] Health Choice, but were declined, whose parents can pay a reduced fee at time of service.  °[facility] ° [address],  (336) 641-4533 Patients are seen by appointment only. Walk-ins are not accepted. [facility] will see patients 18 years of age and older. °Monday - Tuesday (8am-5pm) °Most Wednesdays (8:30-5pm) °$30 per visit, cash only  °[facility] ° [address], [place] (336) 641-4533 Patients are seen by appointment only. Walk-ins are not accepted. [facility] will see patients 18 years of age and older. °One   Wednesday Evening (Monthly: Volunteer Based).  $30 per visit, cash only  °[facility]  (919) 537-3737 for adults; Children under age 4, call Graduate Pediatric Dentistry at (919) [phone]. Children aged 4-14, please call (919) [phone] to request a pediatric application. ° Dental services are provided in all areas of dental care including fillings, crowns and bridges, complete and partial dentures, implants, gum treatment, root canals, and extractions. Preventive care is also provided. Treatment is provided to both adults and children. °Patients are selected via a lottery and there is often a waiting list. °  °[facility] [address], °[place] ° (336) 763-8833 [email or web address].[email or web address] °  °[facility] [address], [place], [place] (336)723-1848, Ext. 123 Second and Fourth Thursday of each month, opens at 6:30 AM; Clinic ends at 9 AM.  Patients are seen on a first-come first-served basis, and a limited number are seen during each clinic.  ° °[facility] ° [address], [place], [place] (336) 723-7904    Eligibility Requirements °You must have lived in [place], [place], or [place] counties for at least the last three months. °  You cannot be eligible for state or federal sponsored healthcare insurance, including Veterans Administration, Medicaid, or Medicare. °  You generally cannot be eligible for healthcare insurance through your employer.  °  How to apply: °Eligibility screenings are held every Tuesday and Wednesday afternoon from 1:00 pm until 4:00 pm. You do not need an appointment for the interview!  °[facility] [address], [place], [place] 336-631-2330   °[facility]  336-342-8273   °[facility]  336-703-3100   ° [facility]  336-570-6415   ° °Behavioral Health Resources in the Community: °Intensive Outpatient Programs °Organization         Address  Phone  Notes  °[facility] [address], [place], [place] 336-878-6098   °[facility] [address], [place], [place] 336-832-9800   °[facility] [address], [place], [place] ° 336-882-2125   °[facility] [address],  °[place], [place] 1-800-853-5163 or 336-641-4981   °Substance Abuse Resources °Organization         Address  Phone  Notes  °[facility]  336-882-2125   °[facility]  336-784-9470   °[facility]  336-285-9073   °[facility]  336-845-3988   °Residential & Outpatient Substance Abuse Program  1-800-659-3381   °Psychological Services °Organization         Address  Phone  Notes  °[facility]  336- 832-9600   °[facility]  336- 378-7881   °[facility] [address], [place] 1-800-853-5163 or 336-641-4981   ° °Mobile Crisis Teams °Organization         Address  Phone  Notes  °[facility], Mobile Crisis Care Unit  1-877-626-1772   °Assertive °[facility] ° [address]  [place], [place] 336-834-9664   °Sharon DeEsch [address] °[place] 336-554-5454   ° °Self-Help/Support Groups °Organization         Address  Phone             Notes  °[facility] [place] - variety of support groups  336- 373-1402 Call for more information  °Narcotics Anonymous (NA), [facility] [address], °[place]   2 meetings at this location  ° °  Residential Treatment Programs Organization         Address  Phone  Notes  [facility] [address]    [place]  1-7636852304   [facility]  [address], [place] [address], [place], [place]   [place], [place] (973)459-5233 Admissions: 8am-3pm M-F  [facility] [address],    [place], [place] 202-542-7062   [facility] [address] [place], [place], [place]   [facility] [address]  [place], [place]   [facility] Intensive Outpatient [address], [place], [place]   [facility] ([place].,  [place], [place] 1-551-035-0158 or 412-794-8604   [facility] [address], [place], [place] Accepts Medicaid  [facility] [address]  [place] 1-850-434-0492 Substance Abuse/Addiction Treatment   [facility] [place] [facility]         Address  Phone  Notes  [facility]  (571) 268-1126   Domenic Schwab, PhD [address] [place], [place]   201-629-4453 or (765)416-0904   [facility] [place], [place] 606-540-2143   [facility] [address] [place], [place] 708-025-9119 Insurance/Medicaid/sponsorship through [facility] and Families [address] [place]                                    [place], [place] [phone] [place] [address], [place] [phone]    Dr. Adele Schilder  [phone]   [facility] Dept. 1) [address], [place] 2) [place] 3)  [address], [place] [phone] [phone]  [phone]   [place] (215)489-6379 or 8592590070 (After Hours)      Take the prescriptions as directed.  Apply moist heat or ice to the area(s) of discomfort, for 15 minutes at a time, several times per day for the next few days.  Do not fall asleep on a heating or ice pack.  Call your regular medical doctor today to schedule a follow up appointment in the next 3 days.  Return to the Emergency Department immediately if worsening.

## 2014-02-01 NOTE — ED Provider Notes (Signed)
CSN: [id number]     Arrival date & time 02/01/14  9983 History   First MD Initiated Contact with Patient 02/01/14 1002     Chief Complaint  Patient presents with  . Fall  . Hip Pain     HPI Pt was seen at 1005. Per pt, c/o sudden onset and persistence of constant right hip "pain" that began this morning. Pt states she was leaning over to pick something up from the floor when "my back went out the way it usually does." Pt states she fell onto her right side. Pt states she was unable to stand up and walk due to pain in her right low back and right hip. Pt describes the pain as "aching," worsens with palpation of the area and weight bearing. Denies any change in her usual chronic low back pain pattern. Denies LOC/AMS, no neck pain, no abd pain, no focal motor weakness, no tingling/numbness in extremity, no syncope, no head injury.        Past Medical History  Diagnosis Date  . GERD (gastroesophageal reflux disease)   . Fibromyalgia   . Migraine   . Leukopenia   . Sinusitis     f/u with Dr. Janace Hoard.   . Chronic back pain   . Sciatica of right side    Past Surgical History  Procedure Laterality Date  . Total abdominal hysterectomy w/ bilateral salpingoophorectomy    . Right knee    . Lumbar/cervical surgeries     Family History  Problem Relation Age of Onset  . Cancer Father     Lung  . Migraines Sister   . Neurodegenerative disease Mother   . Cancer Brother     prostate  . Cancer Maternal Aunt     breast cancer  . Cancer Maternal Grandfather     Leukemia   History  Substance Use Topics  . Smoking status: Never Smoker   . Smokeless tobacco: Never Used  . Alcohol Use: Yes     Comment: occassionally   OB History   Grav Para Term Preterm Abortions TAB SAB Ect Mult Living   2 2 2             Review of Systems ROS: Statement: All systems negative except as marked or noted in the HPI; Constitutional: Negative for fever and chills. ; ; Eyes: Negative for eye pain, redness  and discharge. ; ; ENMT: Negative for ear pain, hoarseness, nasal congestion, sinus pressure and sore throat. ; ; Cardiovascular: Negative for chest pain, palpitations, diaphoresis, dyspnea and peripheral edema. ; ; Respiratory: Negative for cough, wheezing and stridor. ; ; Gastrointestinal: Negative for nausea, vomiting, diarrhea, abdominal pain, blood in stool, hematemesis, jaundice and rectal bleeding. . ; ; Genitourinary: Negative for dysuria, flank pain and hematuria. ; ; Musculoskeletal: +right hip pain, LBP. Negative for neck pain. Negative for swelling and deformity.; ; Skin: Negative for pruritus, rash, abrasions, blisters, bruising and skin lesion.; ; Neuro: Negative for headache, lightheadedness and neck stiffness. Negative for weakness, altered level of consciousness , altered mental status, extremity weakness, paresthesias, involuntary movement, seizure and syncope.       Allergies  Clarithromycin; Codeine; Penicillins; Pregabalin; and Sulfonamide derivatives  Home Medications   Prior to Admission medications   Medication Sig Start Date End Date Taking? Authorizing Provider  acetaminophen (TYLENOL) 500 MG tablet Take 500 mg by mouth every 6 (six) hours as needed for pain.    Historical Provider, MD  amitriptyline (ELAVIL) 100 MG tablet Take 100  mg by mouth at bedtime.    Historical Provider, MD  cholecalciferol (VITAMIN D) 1000 UNITS tablet Take 1,000 Units by mouth daily.    Historical Provider, MD  Cyanocobalamin (VITAMIN B 12 PO) Take by mouth.    Historical Provider, MD  fexofenadine (ALLEGRA) 180 MG tablet Take 180 mg by mouth daily.    Historical Provider, MD  fish oil-omega-3 fatty acids 1000 MG capsule Take 1-2 g by mouth 2 (two) times daily. Patient takes 2 every morning and 1 every afternoon    Historical Provider, MD  meloxicam (MOBIC) 7.5 MG tablet Take 7.5 mg by mouth 2 (two) times daily.    Historical Provider, MD  mometasone (NASONEX) 50 MCG/ACT nasal spray Place 1  spray into the nose daily.    Historical Provider, MD  omeprazole (PRILOSEC) 20 MG capsule Take 20 mg by mouth daily.    Historical Provider, MD  traMADol (ULTRAM) 50 MG tablet Take 50 mg by mouth 2 (two) times daily as needed for pain.    Historical Provider, MD  vitamin C (ASCORBIC ACID) 500 MG tablet Take 500 mg by mouth daily.    Historical Provider, MD  vitamin E 400 UNIT capsule Take 400 Units by mouth daily.    Historical Provider, MD   BP 110/52  Pulse 72  Temp(Src) 98.2 F (36.8 C) (Oral)  Resp 16  SpO2 100% Physical Exam 1010: Physical examination:  Nursing notes reviewed; Vital signs and O2 SAT reviewed;  Constitutional: Well developed, Well nourished, Well hydrated, In no acute distress; Head:  Normocephalic, atraumatic; Eyes: EOMI, PERRL, No scleral icterus; ENMT: Mouth and pharynx normal, Mucous membranes moist; Neck: Supple, Full range of motion, No lymphadenopathy; Cardiovascular: Regular rate and rhythm, No gallop; Respiratory: Breath sounds clear & equal bilaterally, No rales, rhonchi, wheezes.  Speaking full sentences with ease, Normal respiratory effort/excursion; Chest: Nontender, Movement normal; Abdomen: Soft, Nontender, Nondistended, Normal bowel sounds; Genitourinary: No CVA tenderness; Spine:  No midline CS, TS, LS tenderness. +TTP right lumbar paraspinal muscles.;; Extremities: Pulses normal, +mild tenderness to palp right lateral hip. No deformity, no erythema, no ecchymosis, no edema. NT right knee/ankle/foot. NMS intact distal RLE.  No calf edema or asymmetry.; Neuro: AA&Ox3, Major CN grossly intact.  Speech clear. No gross focal motor or sensory deficits in extremities. Strength 5/5 equal bilat UE's and LE's, including great toe dorsiflexion.  DTR 2/4 equal bilat UE's and LE's.  No gross sensory deficits.; Skin: Color normal, Warm, Dry.   ED Course  Procedures  MDM  MDM Reviewed: previous chart, nursing note and vitals Reviewed previous: MRI Interpretation:  x-ray    Dg Hip Complete Right 02/01/2014   CLINICAL DATA:  Fall.  Right hip pain.  EXAM: RIGHT HIP - COMPLETE 2+ VIEW  COMPARISON:  MRI lumbar spine 01/16/2014  FINDINGS: The right hip is located. No acute bone or soft tissue abnormalities are present. Degenerative changes are again noted within the lower lumbar spine.  IMPRESSION: 1. No acute abnormality. 2. Degenerative changes within the lumbar spine.   Electronically Signed   By: Lawrence Santiago M.D.   On: 02/01/2014 10:28   Mr Lumbar Spine Wo Contrast 01/16/2014   CLINICAL DATA:  Low back and bilateral leg pain. Multiple prior lumbar surgeries, most recently in 2009. New onset of right leg pain, weakness, and numbness.  EXAM: MRI LUMBAR SPINE WITHOUT CONTRAST  TECHNIQUE: Multiplanar, multisequence MR imaging of the lumbar spine was performed. No intravenous contrast was administered.  COMPARISON:  02/13/2013  FINDINGS: Vertebral alignment is normal. Vertebral body heights are preserved. Moderate disc space narrowing is again seen at L4-5 and L5-S1. Associated degenerative marrow changes are present at these levels. L3 vertebral body hemangioma is unchanged. The conus medullaris is normal in signal and terminates at the superior aspect of L2. 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  L1-2:  Negative.  L2-3:  Mild facet arthrosis.  No disc herniation or stenosis.  L3-4: Shallow central disc protrusion with annular fissure and mild facet arthrosis without stenosis, unchanged.  L4-5: Prior right hemilaminectomy with decompressed spinal canal. Mild disc bulge, endplate spurring, and facet arthrosis without stenosis, unchanged.  L5-S1: Mild disc bulge with superimposed, shallow central disc protrusion and mild facet arthrosis result in mild right neural foraminal narrowing, unchanged. No spinal canal stenosis.  IMPRESSION: Lower lumbar disc degeneration resulting in mild right neural foraminal narrowing at L5-S1, unchanged. No spinal canal stenosis.    Electronically Signed   By: Logan Bores   On: 01/16/2014 08:18    1300:  Pt has ambulated in the ED with her baseline gait, per family observing. Pt states she has 2 canes at home "to walk with when I get like this." Pt states she feels better after pain meds and wants to go home now. Requesting refill of her ultram "until I can get to my doctor on Tuesday." Dx and testing d/w pt and family.  Questions answered.  Verb understanding, agreeable to d/c home with outpt f/u.   Alfonzo Feller, DO 02/04/14 2120

## 2014-02-01 NOTE — ED Notes (Addendum)
Pt ambulated in the room with minimal assistance and unsteady gait. Pt sts that she uses cain at home when she has back pain flare ups.Dr Thurnell Garbe notified.

## 2014-02-01 NOTE — ED Notes (Signed)
Bed: WA09 Expected date:  Expected time:  Means of arrival:  Comments: EMS-fall 

## 2014-02-04 ENCOUNTER — Ambulatory Visit
Admission: RE | Admit: 2014-02-04 | Discharge: 2014-02-04 | Disposition: A | Discharge status: Home / Self Care | Payer: BC Managed Care – PPO | Source: Ambulatory Visit

## 2014-02-04 DIAGNOSIS — Z1231 Encounter for screening mammogram for malignant neoplasm of breast: Secondary | ICD-10-CM

## 2014-02-05 DIAGNOSIS — Z1331 Encounter for screening for depression: Secondary | ICD-10-CM | POA: Diagnosis not present

## 2014-02-05 DIAGNOSIS — M810 Age-related osteoporosis without current pathological fracture: Secondary | ICD-10-CM | POA: Diagnosis not present

## 2014-02-05 DIAGNOSIS — H209 Unspecified iridocyclitis: Secondary | ICD-10-CM | POA: Diagnosis not present

## 2014-02-05 DIAGNOSIS — Z23 Encounter for immunization: Secondary | ICD-10-CM | POA: Diagnosis not present

## 2014-02-05 DIAGNOSIS — M5137 Other intervertebral disc degeneration, lumbosacral region: Secondary | ICD-10-CM | POA: Diagnosis not present

## 2014-02-05 DIAGNOSIS — Z0000 Encounter for general adult medical examination without abnormal findings: Secondary | ICD-10-CM | POA: Diagnosis not present

## 2014-02-05 DIAGNOSIS — M1990 Unspecified osteoarthritis, unspecified site: Secondary | ICD-10-CM | POA: Diagnosis not present

## 2014-02-05 DIAGNOSIS — R1084 Generalized abdominal pain: Secondary | ICD-10-CM | POA: Diagnosis not present

## 2014-02-05 DIAGNOSIS — IMO0002 Reserved for concepts with insufficient information to code with codable children: Secondary | ICD-10-CM | POA: Diagnosis not present

## 2014-02-07 ENCOUNTER — Telehealth (INDEPENDENT_AMBULATORY_CARE_PROVIDER_SITE_OTHER): Payer: Self-pay | Admitting: Surgery

## 2014-02-07 NOTE — Telephone Encounter (Signed)
Called patient to schedule surgery, patient said she will need to call back as she has other medical issues to deal with right now.

## 2014-02-20 DIAGNOSIS — M545 Low back pain, unspecified: Secondary | ICD-10-CM | POA: Diagnosis not present

## 2014-02-22 DIAGNOSIS — M25559 Pain in unspecified hip: Secondary | ICD-10-CM | POA: Diagnosis not present

## 2014-02-22 DIAGNOSIS — M546 Pain in thoracic spine: Secondary | ICD-10-CM | POA: Diagnosis not present

## 2014-02-22 DIAGNOSIS — H3090 Unspecified chorioretinal inflammation, unspecified eye: Secondary | ICD-10-CM | POA: Diagnosis not present

## 2014-02-22 DIAGNOSIS — M25549 Pain in joints of unspecified hand: Secondary | ICD-10-CM | POA: Diagnosis not present

## 2014-03-11 DIAGNOSIS — M545 Low back pain, unspecified: Secondary | ICD-10-CM | POA: Diagnosis not present

## 2014-03-19 DIAGNOSIS — M810 Age-related osteoporosis without current pathological fracture: Secondary | ICD-10-CM | POA: Diagnosis not present

## 2014-03-20 DIAGNOSIS — N200 Calculus of kidney: Secondary | ICD-10-CM | POA: Diagnosis not present

## 2014-03-20 DIAGNOSIS — Q638 Other specified congenital malformations of kidney: Secondary | ICD-10-CM | POA: Diagnosis not present

## 2014-03-20 DIAGNOSIS — N1330 Unspecified hydronephrosis: Secondary | ICD-10-CM | POA: Diagnosis not present

## 2014-03-20 DIAGNOSIS — IMO0001 Reserved for inherently not codable concepts without codable children: Secondary | ICD-10-CM | POA: Diagnosis not present

## 2014-03-20 DIAGNOSIS — Z87442 Personal history of urinary calculi: Secondary | ICD-10-CM | POA: Diagnosis not present

## 2014-03-25 DIAGNOSIS — H20029 Recurrent acute iridocyclitis, unspecified eye: Secondary | ICD-10-CM | POA: Diagnosis not present

## 2014-04-04 ENCOUNTER — Encounter (HOSPITAL_COMMUNITY): Payer: Self-pay | Admitting: Pharmacy Technician

## 2014-04-05 ENCOUNTER — Encounter (HOSPITAL_COMMUNITY)
Admission: RE | Admit: 2014-04-05 | Discharge: 2014-04-05 | Disposition: A | Discharge status: Home / Self Care | Payer: BC Managed Care – PPO | Source: Ambulatory Visit | Attending: Surgery | Admitting: Surgery

## 2014-04-05 ENCOUNTER — Encounter (HOSPITAL_COMMUNITY): Payer: Self-pay

## 2014-04-05 DIAGNOSIS — Z01812 Encounter for preprocedural laboratory examination: Secondary | ICD-10-CM | POA: Insufficient documentation

## 2014-04-05 DIAGNOSIS — G8929 Other chronic pain: Secondary | ICD-10-CM | POA: Diagnosis not present

## 2014-04-05 DIAGNOSIS — R1011 Right upper quadrant pain: Secondary | ICD-10-CM | POA: Diagnosis present

## 2014-04-05 HISTORY — DX: Lobulated, fused and horseshoe kidney: Q63.1

## 2014-04-05 HISTORY — DX: Age-related osteoporosis without current pathological fracture: M81.0

## 2014-04-05 HISTORY — DX: Personal history of urinary calculi: Z87.442

## 2014-04-05 HISTORY — DX: Foot drop, right foot: M21.371

## 2014-04-05 HISTORY — DX: Pain, unspecified: R52

## 2014-04-05 HISTORY — DX: Disease of blood and blood-forming organs, unspecified: D75.9

## 2014-04-05 LAB — CBC
HCT: 41.4 % (ref 36.0–46.0)
HEMOGLOBIN: 13.6 g/dL (ref 12.0–15.0)
MCH: 29.8 pg (ref 26.0–34.0)
MCHC: 32.9 g/dL (ref 30.0–36.0)
MCV: 90.6 fL (ref 78.0–100.0)
Platelets: 187 10*3/uL (ref 150–400)
RBC: 4.57 MIL/uL (ref 3.87–5.11)
RDW: 15.1 % (ref 11.5–15.5)
WBC: 4.1 10*3/uL (ref 4.0–10.5)

## 2014-04-05 LAB — BASIC METABOLIC PANEL
ANION GAP: 13 (ref 5–15)
BUN: 20 mg/dL (ref 6–23)
CO2: 22 mEq/L (ref 19–32)
Calcium: 8.9 mg/dL (ref 8.4–10.5)
Chloride: 105 mEq/L (ref 96–112)
Creatinine, Ser: 0.65 mg/dL (ref 0.50–1.10)
GFR calc non Af Amer: 88 mL/min — ABNORMAL LOW (ref >90)
GLUCOSE: 81 mg/dL (ref 70–99)
POTASSIUM: 4 meq/L (ref 3.7–5.3)
Sodium: 140 mEq/L (ref 137–147)

## 2014-04-05 NOTE — Patient Instructions (Addendum)
FLEET [place]EMA THE NIGHT BEFORE YOUR SURGERY  YOUR SURGERY IS SCHEDULED AT [facility]  ON:  Friday  9/18  REPORT TO  SHORT STAY CENTER AT:  10:30 AM   PLEASE COME IN THE [facility] MAIN HOSPITAL ENTRANCE AND FOLLOW SIGNS TO SHORT STAY CENTER.  DO NOT EAT OR DRINK ANYTHING AFTER MIDNIGHT THE NIGHT BEFORE YOUR SURGERY.  YOU MAY BRUSH YOUR TEETH, RINSE OUT YOUR MOUTH--BUT NO WATER, NO FOOD, NO CHEWING GUM, NO MINTS, NO CANDIES, NO CHEWING TOBACCO.  PLEASE TAKE THE FOLLOWING MEDICATIONS THE AM OF YOUR SURGERY WITH A FEW SIPS OF WATER:  PRILOSEC  DO NOT BRING VALUABLES, MONEY, CREDIT CARDS.  DO NOT WEAR JEWELRY, MAKE-UP, NAIL POLISH AND NO METAL PINS OR CLIPS IN YOUR HAIR. CONTACT LENS, DENTURES / PARTIALS, GLASSES SHOULD NOT BE WORN TO SURGERY AND IN MOST CASES-HEARING AIDS WILL NEED TO BE REMOVED.  BRING YOUR GLASSES CASE, ANY EQUIPMENT NEEDED FOR YOUR CONTACT LENS. FOR PATIENTS ADMITTED TO THE HOSPITAL--CHECK OUT TIME THE DAY OF DISCHARGE IS 11:00 AM.  ALL INPATIENT ROOMS ARE PRIVATE - WITH BATHROOM, TELEPHONE, TELEVISION AND WIFI INTERNET.  IF YOU ARE BEING DISCHARGED THE SAME DAY OF YOUR SURGERY--YOU CAN NOT DRIVE YOURSELF HOME--AND SHOULD NOT GO HOME ALONE BY TAXI OR BUS.  NO DRIVING OR OPERATING MACHINERY, OR MAKING LEGAL DECISIONS FOR 24 HOURS FOLLOWING ANESTHESIA / PAIN MEDICATIONS.  PLEASE MAKE ARRANGEMENTS FOR SOMEONE TO BE WITH YOU AT HOME THE FIRST 24 HOURS AFTER SURGERY. RESPONSIBLE DRIVER'S NAME / PHONE                                    PT'S HUSBAND WILL BE WITH HER                                                    PLEASE BE AWARE THAT YOU MAY NEED ADDITIONAL BLOOD DRAWN DAY OF YOUR SURGERY  _______________________________________________________________________   [facility] - Preparing for Surgery Before surgery, you can play an important role.  Because skin is not sterile, your skin needs to be as free of germs as possible.  You can reduce the number of germs on your  skin by washing with CHG (chlorahexidine gluconate) soap before surgery.  CHG is an antiseptic cleaner which kills germs and bonds with the skin to continue killing germs even after washing. Please DO NOT use if you have an allergy to CHG or antibacterial soaps.  If your skin becomes reddened/irritated stop using the CHG and inform your nurse when you arrive at Short Stay. Do not shave (including legs and underarms) for at least 48 hours prior to the first CHG shower.  You may shave your face/neck. Please follow these instructions carefully:  1.  Shower with CHG Soap the night before surgery and the  morning of Surgery.  2.  If you choose to wash your hair, wash your hair first as usual with your  normal  shampoo.  3.  After you shampoo, rinse your hair and body thoroughly to remove the  shampoo.                           4.  Use CHG as you would any other liquid  soap.  You can apply chg directly  to the skin and wash                       Gently with a scrungie or clean washcloth.  5.  Apply the CHG Soap to your body ONLY FROM THE NECK DOWN.   Do not use on face/ open                           Wound or open sores. Avoid contact with eyes, ears mouth and genitals (private parts).                       Wash face,  Genitals (private parts) with your normal soap.             6.  Wash thoroughly, paying special attention to the area where your surgery  will be performed.  7.  Thoroughly rinse your body with warm water from the neck down.  8.  DO NOT shower/wash with your normal soap after using and rinsing off  the CHG Soap.                9.  Pat yourself dry with a clean towel.            10.  Wear clean pajamas.            11.  Place clean sheets on your bed the night of your first shower and do not  sleep with pets. Day of Surgery : Do not apply any lotions/deodorants the morning of surgery.  Please wear clean clothes to the hospital/surgery center.  FAILURE TO FOLLOW THESE INSTRUCTIONS MAY  RESULT IN THE CANCELLATION OF YOUR SURGERY PATIENT SIGNATURE_________________________________  NURSE SIGNATURE__________________________________  ________________________________________________________________________

## 2014-04-05 NOTE — Pre-Procedure Instructions (Signed)
EKG AND CXR NOT NEEDED PER ANESTHESIOLOGIST'S GUIDELINES. 

## 2014-04-11 NOTE — Progress Notes (Signed)
Patient notified about surgery time change. Instructed to arrive to Short Stay at 0945. NPO after midnight except sips of water to take meds. Verbalized understanding.

## 2014-04-12 ENCOUNTER — Ambulatory Visit (HOSPITAL_COMMUNITY): Payer: BC Managed Care – PPO | Admitting: Certified Registered Nurse Anesthetist

## 2014-04-12 ENCOUNTER — Ambulatory Visit (HOSPITAL_COMMUNITY)
Admission: RE | Admit: 2014-04-12 | Discharge: 2014-04-12 | Disposition: A | Discharge status: Home / Self Care | Payer: BC Managed Care – PPO | Source: Ambulatory Visit | Attending: Surgery | Admitting: Surgery

## 2014-04-12 ENCOUNTER — Encounter (HOSPITAL_COMMUNITY): Payer: BC Managed Care – PPO | Admitting: Certified Registered Nurse Anesthetist

## 2014-04-12 ENCOUNTER — Encounter (HOSPITAL_COMMUNITY)
Admission: RE | Disposition: A | Discharge status: Home / Self Care | Payer: Self-pay | Source: Ambulatory Visit | Attending: Surgery

## 2014-04-12 ENCOUNTER — Ambulatory Visit (HOSPITAL_COMMUNITY): Payer: BC Managed Care – PPO

## 2014-04-12 ENCOUNTER — Encounter (HOSPITAL_COMMUNITY): Payer: Self-pay | Admitting: *Deleted

## 2014-04-12 DIAGNOSIS — K219 Gastro-esophageal reflux disease without esophagitis: Secondary | ICD-10-CM | POA: Insufficient documentation

## 2014-04-12 DIAGNOSIS — Z90710 Acquired absence of both cervix and uterus: Secondary | ICD-10-CM | POA: Insufficient documentation

## 2014-04-12 DIAGNOSIS — E739 Lactose intolerance, unspecified: Secondary | ICD-10-CM | POA: Diagnosis not present

## 2014-04-12 DIAGNOSIS — R1011 Right upper quadrant pain: Secondary | ICD-10-CM | POA: Diagnosis not present

## 2014-04-12 DIAGNOSIS — I4892 Unspecified atrial flutter: Secondary | ICD-10-CM | POA: Insufficient documentation

## 2014-04-12 DIAGNOSIS — K811 Chronic cholecystitis: Secondary | ICD-10-CM | POA: Insufficient documentation

## 2014-04-12 DIAGNOSIS — F411 Generalized anxiety disorder: Secondary | ICD-10-CM | POA: Insufficient documentation

## 2014-04-12 DIAGNOSIS — M1990 Unspecified osteoarthritis, unspecified site: Secondary | ICD-10-CM | POA: Insufficient documentation

## 2014-04-12 DIAGNOSIS — G43009 Migraine without aura, not intractable, without status migrainosus: Secondary | ICD-10-CM | POA: Insufficient documentation

## 2014-04-12 DIAGNOSIS — K828 Other specified diseases of gallbladder: Secondary | ICD-10-CM | POA: Diagnosis present

## 2014-04-12 DIAGNOSIS — M549 Dorsalgia, unspecified: Secondary | ICD-10-CM | POA: Diagnosis not present

## 2014-04-12 DIAGNOSIS — J45909 Unspecified asthma, uncomplicated: Secondary | ICD-10-CM | POA: Diagnosis not present

## 2014-04-12 DIAGNOSIS — IMO0001 Reserved for inherently not codable concepts without codable children: Secondary | ICD-10-CM | POA: Diagnosis not present

## 2014-04-12 HISTORY — PX: CHOLECYSTECTOMY: SHX55

## 2014-04-12 SURGERY — LAPAROSCOPIC CHOLECYSTECTOMY WITH INTRAOPERATIVE CHOLANGIOGRAM
Anesthesia: General | Site: Abdomen

## 2014-04-12 MED ORDER — CHLORHEXIDINE GLUCONATE 4 % EX LIQD: 1.0000 "application " | Freq: Once | CUTANEOUS | Status: DC

## 2014-04-12 MED ORDER — MIDAZOLAM HCL 2 MG/2ML IJ SOLN
INTRAMUSCULAR | Status: AC
  Filled 2014-04-12: qty 2

## 2014-04-12 MED ORDER — GLYCOPYRROLATE 0.2 MG/ML IJ SOLN
INTRAMUSCULAR | Status: AC
  Filled 2014-04-12: qty 3

## 2014-04-12 MED ORDER — DEXAMETHASONE SODIUM PHOSPHATE 10 MG/ML IJ SOLN
INTRAMUSCULAR | Status: DC | PRN
  Administered 2014-04-12: 10 mg via INTRAVENOUS

## 2014-04-12 MED ORDER — CEFAZOLIN SODIUM-DEXTROSE 2-3 GM-% IV SOLR
INTRAVENOUS | Status: AC
  Filled 2014-04-12: qty 50

## 2014-04-12 MED ORDER — ONDANSETRON HCL 4 MG/2ML IJ SOLN
INTRAMUSCULAR | Status: AC
  Filled 2014-04-12: qty 2

## 2014-04-12 MED ORDER — LIDOCAINE HCL (CARDIAC) 20 MG/ML IV SOLN
INTRAVENOUS | Status: AC
  Filled 2014-04-12: qty 5

## 2014-04-12 MED ORDER — FENTANYL CITRATE 0.05 MG/ML IJ SOLN
INTRAMUSCULAR | Status: DC | PRN
  Administered 2014-04-12 (×5): 50 ug via INTRAVENOUS

## 2014-04-12 MED ORDER — SODIUM CHLORIDE 0.9 % IV SOLN: 250.0000 mL | INTRAVENOUS | Status: DC | PRN

## 2014-04-12 MED ORDER — BUPIVACAINE-EPINEPHRINE (PF) 0.25% -1:200000 IJ SOLN
INTRAMUSCULAR | Status: AC
  Filled 2014-04-12: qty 30

## 2014-04-12 MED ORDER — SUCCINYLCHOLINE CHLORIDE 20 MG/ML IJ SOLN
INTRAMUSCULAR | Status: DC | PRN
  Administered 2014-04-12: 100 mg via INTRAVENOUS

## 2014-04-12 MED ORDER — NEOSTIGMINE METHYLSULFATE 10 MG/10ML IV SOLN
INTRAVENOUS | Status: DC | PRN
  Administered 2014-04-12: 5 mg via INTRAVENOUS

## 2014-04-12 MED ORDER — BUPIVACAINE-EPINEPHRINE (PF) 0.25% -1:200000 IJ SOLN
INTRAMUSCULAR | Status: DC | PRN
  Administered 2014-04-12: 20 mL

## 2014-04-12 MED ORDER — ACETAMINOPHEN 325 MG PO TABS: 650.0000 mg | ORAL_TABLET | ORAL | Status: DC | PRN

## 2014-04-12 MED ORDER — CEFAZOLIN SODIUM-DEXTROSE 2-3 GM-% IV SOLR
2.0000 g | INTRAVENOUS | Status: AC
Start: 2014-04-12 — End: 2014-04-12
  Administered 2014-04-12: 2 g via INTRAVENOUS

## 2014-04-12 MED ORDER — SODIUM CHLORIDE 0.9 % IJ SOLN: 3.0000 mL | INTRAMUSCULAR | Status: DC | PRN

## 2014-04-12 MED ORDER — LIDOCAINE HCL (CARDIAC) 20 MG/ML IV SOLN
INTRAVENOUS | Status: DC | PRN
  Administered 2014-04-12: 100 mg via INTRAVENOUS

## 2014-04-12 MED ORDER — HYDROMORPHONE HCL 1 MG/ML IJ SOLN
INTRAMUSCULAR | Status: AC
  Filled 2014-04-12: qty 1

## 2014-04-12 MED ORDER — PHENYLEPHRINE 40 MCG/ML (10ML) SYRINGE FOR IV PUSH (FOR BLOOD PRESSURE SUPPORT)
PREFILLED_SYRINGE | INTRAVENOUS | Status: AC
  Filled 2014-04-12: qty 10

## 2014-04-12 MED ORDER — HEPARIN SODIUM (PORCINE) 5000 UNIT/ML IJ SOLN
5000.0000 [IU] | Freq: Once | INTRAMUSCULAR | Status: AC
  Administered 2014-04-12: 5000 [IU] via SUBCUTANEOUS
  Filled 2014-04-12: qty 1

## 2014-04-12 MED ORDER — ROCURONIUM BROMIDE 100 MG/10ML IV SOLN
INTRAVENOUS | Status: AC
  Filled 2014-04-12: qty 1

## 2014-04-12 MED ORDER — LACTATED RINGERS IR SOLN
Status: DC | PRN
  Administered 2014-04-12: 1000 mL

## 2014-04-12 MED ORDER — OXYCODONE HCL 5 MG PO TABS: 5.0000 mg | ORAL_TABLET | ORAL | Status: DC | PRN

## 2014-04-12 MED ORDER — PHENYLEPHRINE HCL 10 MG/ML IJ SOLN
INTRAMUSCULAR | Status: DC | PRN
  Administered 2014-04-12: 80 ug via INTRAVENOUS

## 2014-04-12 MED ORDER — DEXAMETHASONE SODIUM PHOSPHATE 10 MG/ML IJ SOLN
INTRAMUSCULAR | Status: AC
  Filled 2014-04-12: qty 1

## 2014-04-12 MED ORDER — LACTATED RINGERS IV SOLN
INTRAVENOUS | Status: DC
  Administered 2014-04-12: 1000 mL via INTRAVENOUS
  Administered 2014-04-12: 11:00:00 via INTRAVENOUS

## 2014-04-12 MED ORDER — PROPOFOL 10 MG/ML IV BOLUS
INTRAVENOUS | Status: DC | PRN
  Administered 2014-04-12: 120 mg via INTRAVENOUS

## 2014-04-12 MED ORDER — ACETAMINOPHEN 650 MG RE SUPP
650.0000 mg | RECTAL | Status: DC | PRN
  Filled 2014-04-12: qty 1

## 2014-04-12 MED ORDER — EPHEDRINE SULFATE 50 MG/ML IJ SOLN
INTRAMUSCULAR | Status: DC | PRN
  Administered 2014-04-12: 10 mg via INTRAVENOUS

## 2014-04-12 MED ORDER — GLYCOPYRROLATE 0.2 MG/ML IJ SOLN
INTRAMUSCULAR | Status: DC | PRN
  Administered 2014-04-12: 0.6 mg via INTRAVENOUS

## 2014-04-12 MED ORDER — PROMETHAZINE HCL 25 MG/ML IJ SOLN: 6.2500 mg | INTRAMUSCULAR | Status: DC | PRN

## 2014-04-12 MED ORDER — 0.9 % SODIUM CHLORIDE (POUR BTL) OPTIME
TOPICAL | Status: DC | PRN
  Administered 2014-04-12: 1000 mL

## 2014-04-12 MED ORDER — NEOSTIGMINE METHYLSULFATE 10 MG/10ML IV SOLN
INTRAVENOUS | Status: AC
  Filled 2014-04-12: qty 1

## 2014-04-12 MED ORDER — MIDAZOLAM HCL 5 MG/5ML IJ SOLN
INTRAMUSCULAR | Status: DC | PRN
  Administered 2014-04-12: 2 mg via INTRAVENOUS

## 2014-04-12 MED ORDER — ROCURONIUM BROMIDE 100 MG/10ML IV SOLN
INTRAVENOUS | Status: DC | PRN
  Administered 2014-04-12: 30 mg via INTRAVENOUS

## 2014-04-12 MED ORDER — IOHEXOL 300 MG/ML  SOLN
INTRAMUSCULAR | Status: DC | PRN
  Administered 2014-04-12: 6 mL

## 2014-04-12 MED ORDER — SODIUM CHLORIDE 0.9 % IJ SOLN: 3.0000 mL | Freq: Two times a day (BID) | INTRAMUSCULAR | Status: DC

## 2014-04-12 MED ORDER — HYDROCODONE-ACETAMINOPHEN 5-325 MG PO TABS: 1.0000 | ORAL_TABLET | ORAL | Status: DC | PRN

## 2014-04-12 MED ORDER — FENTANYL CITRATE 0.05 MG/ML IJ SOLN
INTRAMUSCULAR | Status: AC
  Filled 2014-04-12: qty 5

## 2014-04-12 MED ORDER — SODIUM CHLORIDE 0.9 % IJ SOLN
INTRAMUSCULAR | Status: AC
  Filled 2014-04-12: qty 10

## 2014-04-12 MED ORDER — ONDANSETRON HCL 4 MG/2ML IJ SOLN
INTRAMUSCULAR | Status: DC | PRN
  Administered 2014-04-12: 4 mg via INTRAVENOUS

## 2014-04-12 MED ORDER — PROPOFOL 10 MG/ML IV BOLUS
INTRAVENOUS | Status: AC
  Filled 2014-04-12: qty 20

## 2014-04-12 MED ORDER — HYDROMORPHONE HCL 1 MG/ML IJ SOLN
0.2500 mg | INTRAMUSCULAR | Status: DC | PRN
  Administered 2014-04-12: 0.25 mg via INTRAVENOUS

## 2014-04-12 MED ORDER — EPHEDRINE SULFATE 50 MG/ML IJ SOLN
INTRAMUSCULAR | Status: AC
  Filled 2014-04-12: qty 1

## 2014-04-12 SURGICAL SUPPLY — 46 items
ADH SKN CLS APL DERMABOND .7 (GAUZE/BANDAGES/DRESSINGS) ×2
APL SKNCLS STERI-STRIP NONHPOA (GAUZE/BANDAGES/DRESSINGS)
APPLIER CLIP ROT 10 11.4 M/L (STAPLE) ×2
APR CLP MED LRG 11.4X10 (STAPLE) ×1
BAG SPEC RTRVL 10 TROC 200 (ENDOMECHANICALS) ×1
BAG SPEC RTRVL LRG 6X4 10 (ENDOMECHANICALS)
BENZOIN TINCTURE PRP APPL 2/3 (GAUZE/BANDAGES/DRESSINGS) IMPLANT
CABLE HIGH FREQUENCY MONO STRZ (ELECTRODE) ×1 IMPLANT
CATH REDDICK CHOLANGI 4FR 50CM (CATHETERS) ×2 IMPLANT
CLIP APPLIE ROT 10 11.4 M/L (STAPLE) ×1 IMPLANT
COVER MAYO STAND STRL (DRAPES) ×2 IMPLANT
DECANTER SPIKE VIAL GLASS SM (MISCELLANEOUS) ×1 IMPLANT
DERMABOND ADVANCED (GAUZE/BANDAGES/DRESSINGS) ×2
DERMABOND ADVANCED .7 DNX12 (GAUZE/BANDAGES/DRESSINGS) IMPLANT
DRAPE C-ARM 42X120 X-RAY (DRAPES) ×2 IMPLANT
DRAPE LAPAROSCOPIC ABDOMINAL (DRAPES) ×2 IMPLANT
ELECT REM PT RETURN 9FT ADLT (ELECTROSURGICAL) ×2
ELECTRODE REM PT RTRN 9FT ADLT (ELECTROSURGICAL) ×1 IMPLANT
GLOVE BIOGEL M 8.0 STRL (GLOVE) ×2 IMPLANT
GLOVE BIOGEL PI IND STRL 6.5 (GLOVE) IMPLANT
GLOVE BIOGEL PI IND STRL 7.0 (GLOVE) IMPLANT
GLOVE BIOGEL PI INDICATOR 6.5 (GLOVE) ×1
GLOVE BIOGEL PI INDICATOR 7.0 (GLOVE) ×1
GLOVE SURG SS PI 6.5 STRL IVOR (GLOVE) ×1 IMPLANT
GOWN STRL REUS W/TWL XL LVL3 (GOWN DISPOSABLE) ×10 IMPLANT
HEMOSTAT SURGICEL 4X8 (HEMOSTASIS) IMPLANT
IV CATH 14GX2 1/4 (CATHETERS) ×2 IMPLANT
KIT BASIN OR (CUSTOM PROCEDURE TRAY) ×2 IMPLANT
POUCH RETRIEVAL ECOSAC 10 (ENDOMECHANICALS) ×1 IMPLANT
POUCH RETRIEVAL ECOSAC 10MM (ENDOMECHANICALS) ×1
POUCH SPECIMEN RETRIEVAL 10MM (ENDOMECHANICALS) IMPLANT
SCISSORS LAP 5X45 EPIX DISP (ENDOMECHANICALS) ×2 IMPLANT
SCRUB PCMX 4 OZ (MISCELLANEOUS) ×2 IMPLANT
SET IRRIG TUBING LAPAROSCOPIC (IRRIGATION / IRRIGATOR) ×2 IMPLANT
SLEEVE XCEL OPT CAN 5 100 (ENDOMECHANICALS) ×4 IMPLANT
SOLUTION ANTI FOG 6CC (MISCELLANEOUS) ×2 IMPLANT
STRIP CLOSURE SKIN 1/2X4 (GAUZE/BANDAGES/DRESSINGS) IMPLANT
SUT VIC AB 4-0 SH 18 (SUTURE) ×2 IMPLANT
SYR 20CC LL (SYRINGE) ×2 IMPLANT
TOWEL OR 17X26 10 PK STRL BLUE (TOWEL DISPOSABLE) ×2 IMPLANT
TOWEL OR NON WOVEN STRL DISP B (DISPOSABLE) ×2 IMPLANT
TRAY LAP CHOLE (CUSTOM PROCEDURE TRAY) ×2 IMPLANT
TROCAR BLADELESS OPT 5 100 (ENDOMECHANICALS) ×2 IMPLANT
TROCAR XCEL BLUNT TIP 100MML (ENDOMECHANICALS) IMPLANT
TROCAR XCEL NON-BLD 11X100MML (ENDOMECHANICALS) ×2 IMPLANT
TUBING INSUFFLATION 10FT LAP (TUBING) ×2 IMPLANT

## 2014-04-12 NOTE — H&P (Signed)
Chief Complaint: persistant right upper quadrant pain  History of Present Illness: Leslie Dixon is an 68 y.o. female referred by Dr. Oletta Lamas for cholecystectomy for chronic recurrent right upper quadrant pain. This is postprandial pain associated with numerous different foods. She's had a normal upper endoscopy and normal gallbladder ultrasound. She has an ejection fraction of 36%. He has a family history of unrecognized gallbladder disease.  I explained the procedure to her in detail including complications not limited to common bile duct injuries bile leaks or bowel injuries. She would like to have relief of this pain. I indicated I couldn't guarantee that this will limit her pain she would like to go ahead with having her gallbladder removed. She is followed up by Dr. Allie Bossier at [facility].  Past Medical History   Diagnosis  Date   .  Back pain    .  GERD (gastroesophageal reflux disease)    .  Fibromyalgia    .  Migraine    .  Leukopenia    .  Sinusitis      f/u with Dr. Janace Hoard.    Past Surgical History   Procedure  Laterality  Date   .  Total abdominal hysterectomy w/ bilateral salpingoophorectomy     .  Right knee     .  Lumbar/cervical surgeries      Current Outpatient Prescriptions   Medication  Sig  Dispense  Refill   .  acetaminophen (TYLENOL) 500 MG tablet  Take 500 mg by mouth every 6 (six) hours as needed for pain.     Marland Kitchen  amitriptyline (ELAVIL) 100 MG tablet  Take 100 mg by mouth at bedtime.     .  cholecalciferol (VITAMIN D) 1000 UNITS tablet  Take 1,000 Units by mouth daily.     .  Cyanocobalamin (VITAMIN B 12 PO)  Take by mouth.     .  fexofenadine (ALLEGRA) 180 MG tablet  Take 180 mg by mouth daily.     .  fish oil-omega-3 fatty acids 1000 MG capsule  Take 1-2 g by mouth 2 (two) times daily. Patient takes 2 every morning and 1 every afternoon     .  meloxicam (MOBIC) 7.5 MG tablet  Take 7.5 mg by mouth 2 (two) times daily.     .  mometasone (NASONEX) 50 MCG/ACT  nasal spray  Place 1 spray into the nose daily.     Marland Kitchen  omeprazole (PRILOSEC) 20 MG capsule  Take 20 mg by mouth daily.     .  traMADol (ULTRAM) 50 MG tablet  Take 50 mg by mouth 2 (two) times daily as needed for pain.     .  vitamin C (ASCORBIC ACID) 500 MG tablet  Take 500 mg by mouth daily.     .  vitamin E 400 UNIT capsule  Take 400 Units by mouth daily.      No current facility-administered medications for this visit.   Clarithromycin; Codeine; Penicillins; Pregabalin; and Sulfonamide derivatives  Family History   Problem  Relation  Age of Onset   .  Cancer  Father      Lung   .  Migraines  Sister    .  Neurodegenerative disease  Mother    .  Cancer  Brother      prostate   .  Cancer  Maternal Aunt      breast cancer   .  Cancer  Maternal Grandfather      Leukemia  Social History: reports that she has never smoked. She has never used smokeless tobacco. She reports that she drinks alcohol. She reports that she does not use illicit drugs.  REVIEW OF SYSTEMS :  Positive for fibromyalgia and postprandial abdominal pain mainly on the right side. ; otherwise negative  Physical Exam:  Blood pressure 116/70, pulse 71, temperature 97.3 F (36.3 C), temperature source Temporal, resp. rate 16, height 5' 6.5" (1.689 m), weight 136 lb (61.689 kg).  Body mass index is 21.62 kg/(m^2).  Gen: WDWN white female NAD  Neurological: Alert and oriented to person, place, and time. Motor and sensory function is grossly intact  Head: Normocephalic and atraumatic.  Eyes: Conjunctivae are normal. Pupils are equal, round, and reactive to light. No scleral icterus.  Neck: Normal range of motion. Neck supple. No tracheal deviation or thyromegaly present.  Cardiovascular: SR without murmurs or gallops. No carotid bruits  Breast: Not examined  Respiratory: Effort normal. No respiratory distress. No chest wall tenderness. Breath sounds normal. No wheezes, rales or rhonchi.  Abdomen: No active pain at the  present time.  GU: Not examined  Musculoskeletal: Normal range of motion. Extremities are nontender. No cyanosis, edema or clubbing noted Lymphadenopathy: No cervical, preauricular, postauricular or axillary adenopathy is present Skin: Skin is warm and dry. No rash noted. No diaphoresis. No erythema. No pallor. Pscyh: Normal mood and affect. Behavior is normal. Judgment and thought content normal.  LABORATORY RESULTS:  No results found for this or any previous visit (from the past 48 hour(s)).  RADIOLOGY RESULTS:  No results found.  Problem List:  Patient Active Problem List    Diagnosis  Date Noted   .  Leukopenia    .  Migraine    .  Back pain    .  HYPOKALEMIA  01/03/2008   .  ATRIAL FLUTTER  01/03/2008   .  ALLERGIC RHINITIS DUE TO POLLEN  12/13/2007   .  VOCAL CORD DISORDER  06/09/2007   .  ASTHMA, WITH ACUTE EXACERBATION  04/24/2007   .  LACTOSE INTOLERANCE  03/07/2007   .  ANXIETY  03/07/2007   .  COMMON MIGRAINE  03/07/2007   .  OCULAR MIGRAINE  03/07/2007   .  CARPAL TUNNEL SYNDROME  03/07/2007   .  VARICOSE VEIN  03/07/2007   .  ALLERGIC RHINITIS  03/07/2007   .  ASTHMA  03/07/2007   .  GERD  03/07/2007   .  OSTEOARTHRITIS  03/07/2007   .  FIBROMYALGIA  03/07/2007   .  OSTEOPOROSIS  03/07/2007   .  NEPHROLITHIASIS, HX OF  03/07/2007   Assessment & Plan:  Upper quadrant pain despite negative workup. Patient has been referred by her gastroenterologist is likely her gallbladder. He completed we're missing small stones on her ultrasound have explained that this may not relieve her pain. She is ready to accept the risk and t have her gallbladder removed.  Matt B. Hassell Done, MD, [facility]  [phone] beeper  [phone]

## 2014-04-12 NOTE — Anesthesia Postprocedure Evaluation (Signed)
  Anesthesia Post-op Note  Patient: Leslie Dixon  Procedure(s) Performed: Procedure(s) (LRB): LAPAROSCOPIC CHOLECYSTECTOMY WITH INTRAOPERATIVE CHOLANGIOGRAM (N/A)  Patient Location: PACU  Anesthesia Type: General  Level of Consciousness: awake and alert   Airway and Oxygen Therapy: Patient Spontanous Breathing  Post-op Pain: mild  Post-op Assessment: Post-op Vital signs reviewed, Patient's Cardiovascular Status Stable, Respiratory Function Stable, Patent Airway and No signs of Nausea or vomiting  Last Vitals:  Filed Vitals:   04/12/14 1425  BP: 101/55  Pulse: 66  Temp:   Resp: 20    Post-op Vital Signs: stable   Complications: No apparent anesthesia complications

## 2014-04-12 NOTE — Transfer of Care (Signed)
Immediate Anesthesia Transfer of Care Note  Patient: Leslie Dixon  Procedure(s) Performed: Procedure(s) (LRB): LAPAROSCOPIC CHOLECYSTECTOMY WITH INTRAOPERATIVE CHOLANGIOGRAM (N/A)  Patient Location: PACU  Anesthesia Type: General  Level of Consciousness: sedated, patient cooperative and responds to stimulation  Airway & Oxygen Therapy: Patient Spontanous Breathing and Patient connected to face mask oxgen  Post-op Assessment: Report given to PACU RN and Post -op Vital signs reviewed and stable  Post vital signs: Reviewed and stable  Complications: No apparent anesthesia complications

## 2014-04-12 NOTE — Interval H&P Note (Signed)
History and Physical Interval Note:  04/12/2014 10:02 AM  Leslie Dixon  has presented today for surgery, with the diagnosis of Chronic Right Upper Quadrant Abdominal Pain  The various methods of treatment have been discussed with the patient and family. After consideration of risks, benefits and other options for treatment, the patient has consented to  Procedure(s): LAPAROSCOPIC CHOLECYSTECTOMY WITH INTRAOPERATIVE CHOLANGIOGRAM (N/A) as a surgical intervention .  The patient's history has been reviewed, patient examined, no change in status, stable for surgery.  I have reviewed the patient's chart and labs.  Questions were answered to the patient's satisfaction.     Louella Medaglia B

## 2014-04-12 NOTE — Op Note (Signed)
Harriet Pho @date @  Procedure: Laparoscopic Cholecystectomy with intraoperative cholangiogram  Surgeon: Kaylyn Lim, MD, FACS Asst:  none  Anes:  General  Drains: None  Findings: Chronic adhesions between the gallbladder and duodenum  Description of Procedure: The patient was taken to OR 11 and given general anesthesia.  The patient was prepped with PCMX and draped sterilely. A time out was performed.  Access to the abdomen was achieved with  5 mm Optiview without incident.  Port placement included a 12 in the left upper quadrant, and two other 5 mm trocars.  I surveyed the lower abdomen and saw no unusual findings.    The gallbladder was visualized and the fundus was grasped and the gallbladder was elevated. Traction on the infundibulum allowed for successful demonstration of the critical view. Inflammatory changes were chronic and significant.  The cystic duct was identified and clipped up on the gallbladder and an incision was made in the cystic duct and the Reddick catheter was inserted after milking the cystic duct of any debris. A dynamic cholangiogram was performed which demonstrated a long cystic duct and retrograde pancreatic duct filling.  Initially the tech had the IOC upside down.    The cystic duct was then triple clipped and divided, the cystic artery was double clipped and divided and then the gallbladder was removed from the gallbladder bed. Removal of the gallbladder from the gallbladder bed was straightforward.  The gallbladder was then placed in a bag and brought out through one of the 10 mm trocar sites. The gallbladder bed was inspected and no bleeding or bile leaks were seen.   Laparoscopic visualization was used when closing fascial defects for trocar sites.   Incisions were injected with lidocaine and closed with 4-0 Vicryl and Dermabond on the skin.  Sponge and needle count were correct.    The patient was taken to the recovery room in satisfactory condition.

## 2014-04-12 NOTE — Anesthesia Preprocedure Evaluation (Signed)
Anesthesia Evaluation  Patient identified by MRN, date of birth, ID band Patient awake    Reviewed: Allergy & Precautions, H&P , NPO status , Patient's Chart, lab work & pertinent test results  Airway Mallampati: II TM Distance: >3 FB Neck ROM: Full    Dental no notable dental hx.    Pulmonary asthma ,  breath sounds clear to auscultation  Pulmonary exam normal       Cardiovascular negative cardio ROS  + dysrhythmias Atrial Fibrillation Rhythm:Regular Rate:Normal     Neuro/Psych  Headaches, Anxiety  Neuromuscular disease    GI/Hepatic Neg liver ROS, GERD-  ,  Endo/Other  negative endocrine ROS  Renal/GU Renal disease  negative genitourinary   Musculoskeletal  (+) Arthritis -, Fibromyalgia -  Abdominal   Peds negative pediatric ROS (+)  Hematology  (+) Blood dyscrasia, ,   Anesthesia Other Findings   Reproductive/Obstetrics negative OB ROS                           Anesthesia Physical Anesthesia Plan  ASA: II  Anesthesia Plan: General   Post-op Pain Management:    Induction: Intravenous  Airway Management Planned: Oral ETT  Additional Equipment:   Intra-op Plan:   Post-operative Plan: Extubation in OR  Informed Consent: I have reviewed the patients History and Physical, chart, labs and discussed the procedure including the risks, benefits and alternatives for the proposed anesthesia with the patient or authorized representative who has indicated his/her understanding and acceptance.   Dental advisory given  Plan Discussed with: CRNA  Anesthesia Plan Comments:         Anesthesia Quick Evaluation

## 2014-04-12 NOTE — Progress Notes (Signed)
Pt ambulated to bathroom, voided large amount clear yellow urine.  Pt ambulated in short stay and tolerated well.

## 2014-04-12 NOTE — Discharge Instructions (Signed)
Laparoscopic Cholecystectomy, Care After These instructions give you information on caring for yourself after your procedure. Your doctor may also give you more specific instructions. Call your doctor if you have any problems or questions after your procedure.  HOME CARE  Change your bandages (dressings) as told by your doctor.  Keep the wound dry and clean. Wash the wound gently with soap and water. Pat the wound dry with a clean towel.  Do not take baths, swim, or use hot tubs for 2 weeks, or as told by your doctor.  Only take medicine as told by your doctor.  Eat a normal diet as told by your doctor.  Do not lift anything heavier than 10 pounds (4.5 kg) until your doctor says it is okay.  Do not play contact sports for 1 week, or as told by your doctor. GET HELP IF:  Your wound is red, puffy (swollen), or painful.  You have yellowish-white fluid (pus) coming from the wound.  You have fluid draining from the wound for more than 1 day.  You have a bad smell coming from the wound.  Your wound breaks open. GET HELP RIGHT AWAY IF:   You have a rash.  You have trouble breathing.  You have chest pain.  You have a fever.  You have pain in the shoulders (shoulder strap areas) that is getting worse.  You feel dizzy or pass out (faint).  You have severe belly (abdominal) pain.  You feel sick to your stomach (nauseous) or throw up (vomit) for more than 1 day. Document Released: 04/20/2008 Document Revised: 05/02/2013 Document Reviewed: 02/21/2013 [facility] Patient Information 2015 [place], [place]. This information is not intended to replace advice given to you by your health care provider. Make sure you discuss any questions you have with your health care provider. Laparoscopic Cholecystectomy Laparoscopic cholecystectomy is surgery to remove the gallbladder. The gallbladder is located in the upper right part of the abdomen, behind the liver. It is a storage sac for bile  produced in the liver. Bile aids in the digestion and absorption of fats. Cholecystectomy is often done for inflammation of the gallbladder (cholecystitis). This condition is usually caused by a buildup of gallstones (cholelithiasis) in your gallbladder. Gallstones can block the flow of bile, resulting in inflammation and pain. In severe cases, emergency surgery may be required. When emergency surgery is not required, you will have time to prepare for the procedure. Laparoscopic surgery is an alternative to open surgery. Laparoscopic surgery has a shorter recovery time. Your common bile duct may also need to be examined during the procedure. If stones are found in the common bile duct, they may be removed. LET [facility] CARE PROVIDER KNOW ABOUT:  Any allergies you have.  All medicines you are taking, including vitamins, herbs, eye drops, creams, and over-the-counter medicines.  Previous problems you or members of your family have had with the use of anesthetics.  Any blood disorders you have.  Previous surgeries you have had.  Medical conditions you have. RISKS AND COMPLICATIONS Generally, this is a safe procedure. However, as with any procedure, complications can occur. Possible complications include:  Infection.  Damage to the common bile duct, nerves, arteries, veins, or other internal organs such as the stomach, liver, or intestines.  Bleeding.  A stone may remain in the common bile duct.  A bile leak from the cyst duct that is clipped when your gallbladder is removed.  The need to convert to open surgery, which requires a larger  incision in the abdomen. This may be necessary if your surgeon thinks it is not safe to continue with a laparoscopic procedure. BEFORE THE PROCEDURE  Ask your health care provider about changing or stopping any regular medicines. You will need to stop taking aspirin or blood thinners at least 5 days prior to surgery.  Do not eat or drink anything after  midnight the night before surgery.  Let your health care provider know if you develop a cold or other infectious problem before surgery. PROCEDURE   You will be given medicine to make you sleep through the procedure (general anesthetic). A breathing tube will be placed in your mouth.  When you are asleep, your surgeon will make several small cuts (incisions) in your abdomen.  A thin, lighted tube with a tiny camera on the end (laparoscope) is inserted through one of the small incisions. The camera on the laparoscope sends a picture to a TV screen in the operating room. This gives the surgeon a good view inside your abdomen.  A gas will be pumped into your abdomen. This expands your abdomen so that the surgeon has more room to perform the surgery.  Other tools needed for the procedure are inserted through the other incisions. The gallbladder is removed through one of the incisions.  After the removal of your gallbladder, the incisions will be closed with stitches, staples, or skin glue. AFTER THE PROCEDURE  You will be taken to a recovery area where your progress will be checked often.  You may be allowed to go home the same day if your pain is controlled and you can tolerate liquids. Document Released: 07/12/2005 Document Revised: 05/02/2013 Document Reviewed: 02/21/2013 [facility] Patient Information 2015 [place], [place]. This information is not intended to replace advice given to you by your health care provider. Make sure you discuss any questions you have with your health care provider.

## 2014-04-15 ENCOUNTER — Encounter (HOSPITAL_COMMUNITY): Payer: Self-pay | Admitting: Surgery

## 2014-05-27 ENCOUNTER — Encounter (HOSPITAL_COMMUNITY): Payer: Self-pay | Admitting: Surgery

## 2014-07-03 ENCOUNTER — Other Ambulatory Visit: Payer: Self-pay | Admitting: Obstetrics and Gynecology

## 2014-07-03 DIAGNOSIS — N644 Mastodynia: Secondary | ICD-10-CM

## 2014-07-16 ENCOUNTER — Ambulatory Visit
Admission: RE | Admit: 2014-07-16 | Discharge: 2014-07-16 | Disposition: A | Discharge status: Home / Self Care | Payer: BC Managed Care – PPO | Source: Ambulatory Visit | Attending: Obstetrics and Gynecology | Admitting: Obstetrics and Gynecology

## 2014-07-16 DIAGNOSIS — N644 Mastodynia: Secondary | ICD-10-CM

## 2014-08-05 DIAGNOSIS — R300 Dysuria: Secondary | ICD-10-CM | POA: Diagnosis not present

## 2014-08-05 DIAGNOSIS — M810 Age-related osteoporosis without current pathological fracture: Secondary | ICD-10-CM | POA: Diagnosis not present

## 2014-08-05 DIAGNOSIS — M899 Disorder of bone, unspecified: Secondary | ICD-10-CM | POA: Diagnosis not present

## 2014-08-05 DIAGNOSIS — M519 Unspecified thoracic, thoracolumbar and lumbosacral intervertebral disc disorder: Secondary | ICD-10-CM | POA: Diagnosis not present

## 2014-08-05 DIAGNOSIS — J019 Acute sinusitis, unspecified: Secondary | ICD-10-CM | POA: Diagnosis not present

## 2014-08-13 DIAGNOSIS — N644 Mastodynia: Secondary | ICD-10-CM | POA: Diagnosis not present

## 2014-08-19 ENCOUNTER — Other Ambulatory Visit: Payer: Self-pay | Admitting: Internal Medicine

## 2014-08-19 DIAGNOSIS — M519 Unspecified thoracic, thoracolumbar and lumbosacral intervertebral disc disorder: Secondary | ICD-10-CM | POA: Diagnosis not present

## 2014-08-19 DIAGNOSIS — R109 Unspecified abdominal pain: Secondary | ICD-10-CM

## 2014-08-19 DIAGNOSIS — K589 Irritable bowel syndrome without diarrhea: Secondary | ICD-10-CM | POA: Diagnosis not present

## 2014-08-19 DIAGNOSIS — Z87442 Personal history of urinary calculi: Secondary | ICD-10-CM | POA: Diagnosis not present

## 2014-08-19 DIAGNOSIS — Z6821 Body mass index (BMI) 21.0-21.9, adult: Secondary | ICD-10-CM | POA: Diagnosis not present

## 2014-08-19 DIAGNOSIS — R1032 Left lower quadrant pain: Secondary | ICD-10-CM | POA: Diagnosis not present

## 2014-08-20 ENCOUNTER — Ambulatory Visit
Admission: RE | Admit: 2014-08-20 | Discharge: 2014-08-20 | Disposition: A | Discharge status: Home / Self Care | Payer: 59 | Source: Ambulatory Visit | Attending: Internal Medicine | Admitting: Internal Medicine

## 2014-08-20 DIAGNOSIS — Q631 Lobulated, fused and horseshoe kidney: Secondary | ICD-10-CM | POA: Diagnosis not present

## 2014-08-20 DIAGNOSIS — R110 Nausea: Secondary | ICD-10-CM | POA: Diagnosis not present

## 2014-08-20 DIAGNOSIS — R109 Unspecified abdominal pain: Secondary | ICD-10-CM | POA: Diagnosis not present

## 2014-08-20 DIAGNOSIS — M5137 Other intervertebral disc degeneration, lumbosacral region: Secondary | ICD-10-CM | POA: Diagnosis not present

## 2014-09-02 DIAGNOSIS — R0981 Nasal congestion: Secondary | ICD-10-CM | POA: Diagnosis not present

## 2014-09-02 DIAGNOSIS — J3089 Other allergic rhinitis: Secondary | ICD-10-CM | POA: Diagnosis not present

## 2014-09-16 DIAGNOSIS — L570 Actinic keratosis: Secondary | ICD-10-CM | POA: Diagnosis not present

## 2014-10-02 DIAGNOSIS — M5136 Other intervertebral disc degeneration, lumbar region: Secondary | ICD-10-CM | POA: Diagnosis not present

## 2014-10-02 DIAGNOSIS — M5012 Cervical disc disorder with radiculopathy, mid-cervical region: Secondary | ICD-10-CM | POA: Diagnosis not present

## 2014-10-02 DIAGNOSIS — M5416 Radiculopathy, lumbar region: Secondary | ICD-10-CM | POA: Diagnosis not present

## 2014-10-02 DIAGNOSIS — M542 Cervicalgia: Secondary | ICD-10-CM | POA: Diagnosis not present

## 2014-10-14 ENCOUNTER — Other Ambulatory Visit: Payer: Self-pay | Admitting: Physical Medicine and Rehabilitation

## 2014-10-14 DIAGNOSIS — M5012 Mid-cervical disc disorder, unspecified level: Secondary | ICD-10-CM

## 2014-10-23 ENCOUNTER — Ambulatory Visit
Admission: RE | Admit: 2014-10-23 | Discharge: 2014-10-23 | Disposition: A | Discharge status: Home / Self Care | Payer: PRIVATE HEALTH INSURANCE | Source: Ambulatory Visit | Attending: Physical Medicine and Rehabilitation | Admitting: Physical Medicine and Rehabilitation

## 2014-10-23 DIAGNOSIS — M4322 Fusion of spine, cervical region: Secondary | ICD-10-CM | POA: Diagnosis not present

## 2014-10-23 DIAGNOSIS — M5023 Other cervical disc displacement, cervicothoracic region: Secondary | ICD-10-CM | POA: Diagnosis not present

## 2014-10-23 DIAGNOSIS — M5012 Mid-cervical disc disorder, unspecified level: Secondary | ICD-10-CM

## 2014-10-25 DIAGNOSIS — M5012 Cervical disc disorder with radiculopathy, mid-cervical region: Secondary | ICD-10-CM | POA: Diagnosis not present

## 2014-10-25 DIAGNOSIS — M5136 Other intervertebral disc degeneration, lumbar region: Secondary | ICD-10-CM | POA: Diagnosis not present

## 2014-10-28 DIAGNOSIS — R05 Cough: Secondary | ICD-10-CM | POA: Diagnosis not present

## 2014-10-28 DIAGNOSIS — J069 Acute upper respiratory infection, unspecified: Secondary | ICD-10-CM | POA: Diagnosis not present

## 2014-10-28 DIAGNOSIS — Z6821 Body mass index (BMI) 21.0-21.9, adult: Secondary | ICD-10-CM | POA: Diagnosis not present

## 2014-10-29 DIAGNOSIS — Z85828 Personal history of other malignant neoplasm of skin: Secondary | ICD-10-CM | POA: Diagnosis not present

## 2014-10-29 DIAGNOSIS — Z08 Encounter for follow-up examination after completed treatment for malignant neoplasm: Secondary | ICD-10-CM | POA: Diagnosis not present

## 2014-11-06 ENCOUNTER — Telehealth: Payer: Self-pay | Admitting: Hematology and Oncology

## 2014-11-06 NOTE — Telephone Encounter (Signed)
Called patient and she is aware of her new appointment with dr Alvy Bimler

## 2014-12-09 DIAGNOSIS — R0781 Pleurodynia: Secondary | ICD-10-CM | POA: Diagnosis not present

## 2014-12-09 DIAGNOSIS — M519 Unspecified thoracic, thoracolumbar and lumbosacral intervertebral disc disorder: Secondary | ICD-10-CM | POA: Diagnosis not present

## 2014-12-09 DIAGNOSIS — S299XXA Unspecified injury of thorax, initial encounter: Secondary | ICD-10-CM | POA: Diagnosis not present

## 2014-12-09 DIAGNOSIS — R0689 Other abnormalities of breathing: Secondary | ICD-10-CM | POA: Diagnosis not present

## 2014-12-09 DIAGNOSIS — G43109 Migraine with aura, not intractable, without status migrainosus: Secondary | ICD-10-CM | POA: Diagnosis not present

## 2014-12-09 DIAGNOSIS — M810 Age-related osteoporosis without current pathological fracture: Secondary | ICD-10-CM | POA: Diagnosis not present

## 2014-12-09 DIAGNOSIS — R590 Localized enlarged lymph nodes: Secondary | ICD-10-CM | POA: Diagnosis not present

## 2014-12-09 DIAGNOSIS — Z6821 Body mass index (BMI) 21.0-21.9, adult: Secondary | ICD-10-CM | POA: Diagnosis not present

## 2014-12-12 DIAGNOSIS — Z01419 Encounter for gynecological examination (general) (routine) without abnormal findings: Secondary | ICD-10-CM | POA: Diagnosis not present

## 2014-12-12 DIAGNOSIS — Z6821 Body mass index (BMI) 21.0-21.9, adult: Secondary | ICD-10-CM | POA: Diagnosis not present

## 2014-12-13 DIAGNOSIS — L710 Perioral dermatitis: Secondary | ICD-10-CM | POA: Diagnosis not present

## 2015-01-02 ENCOUNTER — Other Ambulatory Visit (HOSPITAL_BASED_OUTPATIENT_CLINIC_OR_DEPARTMENT_OTHER): Payer: 59

## 2015-01-02 ENCOUNTER — Ambulatory Visit: Payer: BC Managed Care – PPO

## 2015-01-02 ENCOUNTER — Other Ambulatory Visit: Payer: BC Managed Care – PPO

## 2015-01-02 ENCOUNTER — Other Ambulatory Visit: Payer: Self-pay | Admitting: Hematology and Oncology

## 2015-01-02 ENCOUNTER — Ambulatory Visit (HOSPITAL_BASED_OUTPATIENT_CLINIC_OR_DEPARTMENT_OTHER): Payer: 59 | Admitting: Hematology and Oncology

## 2015-01-02 ENCOUNTER — Encounter: Payer: Self-pay | Admitting: Hematology and Oncology

## 2015-01-02 VITALS — BP 112/62 | HR 69 | Temp 98.2°F | Resp 18 | Ht 66.0 in | Wt 135.9 lb

## 2015-01-02 DIAGNOSIS — D72819 Decreased white blood cell count, unspecified: Secondary | ICD-10-CM

## 2015-01-02 LAB — CBC WITH DIFFERENTIAL/PLATELET
BASO%: 0.4 % (ref 0.0–2.0)
BASOS ABS: 0 10*3/uL (ref 0.0–0.1)
EOS%: 2.1 % (ref 0.0–7.0)
Eosinophils Absolute: 0.1 10*3/uL (ref 0.0–0.5)
HCT: 41.1 % (ref 34.8–46.6)
HEMOGLOBIN: 13.5 g/dL (ref 11.6–15.9)
LYMPH%: 40.5 % (ref 14.0–49.7)
MCH: 29.5 pg (ref 25.1–34.0)
MCHC: 32.8 g/dL (ref 31.5–36.0)
MCV: 90 fL (ref 79.5–101.0)
MONO#: 0.3 10*3/uL (ref 0.1–0.9)
MONO%: 7.2 % (ref 0.0–14.0)
NEUT#: 2.4 10*3/uL (ref 1.5–6.5)
NEUT%: 49.8 % (ref 38.4–76.8)
Platelets: 169 10*3/uL (ref 145–400)
RBC: 4.57 10*6/uL (ref 3.70–5.45)
RDW: 15.8 % — ABNORMAL HIGH (ref 11.2–14.5)
WBC: 4.8 10*3/uL (ref 3.9–10.3)
lymph#: 1.9 10*3/uL (ref 0.9–3.3)

## 2015-01-02 LAB — MORPHOLOGY: PLT EST: ADEQUATE

## 2015-01-02 LAB — CHCC SMEAR

## 2015-01-02 NOTE — Assessment & Plan Note (Signed)
The cause is unknown, likely benign. The last 2 CBC showed normal white blood cell count I will discharge the patient from the office and reassure her that the leukopenia is likely benign in nature

## 2015-01-02 NOTE — Progress Notes (Signed)
[facility] FOLLOW-UP progress notes  Patient Care Team: Velna Hatchet, MD as PCP - General (Internal Medicine) Heath Lark, MD as Consulting Physician (Hematology and Oncology)  CHIEF COMPLAINTS/PURPOSE OF VISIT:  Chronic leukopenia  HISTORY OF PRESENTING ILLNESS:  Leslie Dixon 69 y.o. female was transferred to my care after her prior physician has left.  I reviewed the patient's records extensive and collaborated the history with the patient. Summary of her history is as follows: The patient was noted to have leukopenia from routine blood drawn Her platelet count is usually somewhere around 3-4 She denies history of recurrent infection She denies diagnosis of autoimmune disorder She feels well.  MEDICAL HISTORY:  Past Medical History  Diagnosis Date  . Fibromyalgia   . Leukopenia   . Sinusitis     f/u with Dr. Janace Hoard.   . Chronic back pain   . Sciatica of right side   . GERD (gastroesophageal reflux disease)     PRILOSEC PRN  . Migraine     OCCULAR MIGRAINES - AURA WITH EYES  . Pain     LUMBAR PAIN - RECENT FALL BECAUSE LEGS GAVE OUT - PT HAS HAD LUMBAR INJECTION SINCE THE FALL THAT HAS HELPED BACK PAIN  . Blood dyscrasia     LEUKOPENIA- DR. CHISM - NO TREATMENT BUT IS MONITORED  . Horseshoe kidney     BILATERAL  . History of kidney stones   . Pain     CHRONIC RIGHT UPPER QUADRANT PAIN - RELATED TO GALLBLADDER PROBLEM  . 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 Osteoporosis     SURGICAL HISTORY: Past Surgical History  Procedure Laterality Date  . Right knee      ARTHROSCOPY  . Lumbar/cervical surgeries      CERVICAL FUSION C7-C6 WITH BONE GRAFT; C6-C5 WITH PLATE AND 4 SCREWS;  3 LUMBAR SURGERIES BUT NO FUSION  . Bilateral salpingoophorectomy  2000  . Abdominal hysterectomy  1983  . Nasal sinus surgery  2010  . Wisdom tooth pulled    . Appendectomy  1968  . Cholecystectomy N/A 04/12/2014    Procedure: LAPAROSCOPIC CHOLECYSTECTOMY  WITH INTRAOPERATIVE CHOLANGIOGRAM;  Surgeon: Kaylyn Lim, MD;  Location: [facility];  Service: General;  Laterality: N/A;    SOCIAL HISTORY: History   Social History  . Marital Status: Married    Spouse Name: N/A  . Number of Children: 2  . Years of Education: N/A   Occupational History  .      retired Press photographer   Social History Main Topics  . Smoking status: Never Smoker   . Smokeless tobacco: Never Used  . Alcohol Use: Yes     Comment: occassionally  . Drug Use: No  . Sexual Activity: Not on file   Other Topics Concern  . Not on file   Social History Narrative    FAMILY HISTORY: Family History  Problem Relation Age of Onset  . Cancer Father     Lung  . Migraines Sister   . Neurodegenerative disease Mother   . Cancer Brother     prostate  . Cancer Maternal Aunt     breast cancer  . Cancer Maternal Grandfather     Leukemia    ALLERGIES:  is allergic to clarithromycin; codeine; other; penicillins; pregabalin; and sulfonamide derivatives.  MEDICATIONS:  Current Outpatient Prescriptions  Medication Sig Dispense Refill  . acetaminophen (TYLENOL) 500 MG tablet Take 500 mg by mouth every 6 (six)  hours as needed for pain.    . carboxymethylcellulose (REFRESH PLUS) 0.5 % SOLN Place 1-2 drops into both eyes daily as needed (dry eyes.).    Marland Kitchen cholecalciferol (VITAMIN D) 1000 UNITS tablet Take 2,000 Units by mouth daily.     . cyclobenzaprine (FLEXERIL) 10 MG tablet Take 10 mg by mouth 3 (three) times daily as needed for muscle spasms.    Marland Kitchen ibandronate (BONIVA) 150 MG tablet Take 150 mg by mouth every 30 (thirty) days. Take in the morning with a full glass of water, on an empty stomach, and do not take anything else by mouth or lie down for the next 30 min.    Marland Kitchen loratadine (CLARITIN) 10 MG tablet Take by mouth.    . meloxicam (MOBIC) 7.5 MG tablet Take 7.5 mg by mouth 2 (two) times daily.    . Omeprazole (PRILOSEC PO) Take by mouth. ONE IF NEEDED    . traMADol (ULTRAM)  50 MG tablet Take 1 tablet (50 mg total) by mouth every 6 (six) hours as needed for moderate pain. 20 tablet 0  . vitamin C (ASCORBIC ACID) 500 MG tablet Take 500 mg by mouth daily.    . vitamin E 400 UNIT capsule Take 400 Units by mouth daily.     No current facility-administered medications for this visit.    REVIEW OF SYSTEMS:   Constitutional: Denies fevers, chills or abnormal night sweats Eyes: Denies blurriness of vision, double vision or watery eyes Ears, nose, mouth, throat, and face: Denies mucositis or sore throat Respiratory: Denies cough, dyspnea or wheezes Cardiovascular: Denies palpitation, chest discomfort or lower extremity swelling Gastrointestinal:  Denies nausea, heartburn or change in bowel habits Skin: Denies abnormal skin rashes Lymphatics: Denies new lymphadenopathy or easy bruising Neurological:Denies numbness, tingling or new weaknesses Behavioral/Psych: Mood is stable, no new changes  All other systems were reviewed with the patient and are negative.  PHYSICAL EXAMINATION: ECOG PERFORMANCE STATUS: 0 - Asymptomatic  Filed Vitals:   01/02/15 1203  BP: 112/62  Pulse: 69  Temp: 98.2 F (36.8 C)  Resp: 18   Filed Weights   01/02/15 1203  Weight: 135 lb 14.4 oz (61.644 kg)    GENERAL:alert, no distress and comfortable SKIN: skin color, texture, turgor are normal, no rashes or significant lesions EYES: normal, conjunctiva are pink and non-injected, sclera clear OROPHARYNX:no exudate, normal lips, buccal mucosa, and tongue  NECK: supple, thyroid normal size, non-tender, without nodularity LYMPH:  no palpable lymphadenopathy in the cervical, axillary or inguinal LUNGS: clear to auscultation and percussion with normal breathing effort HEART: regular rate & rhythm and no murmurs without lower extremity edema ABDOMEN:abdomen soft, non-tender and normal bowel sounds Musculoskeletal:no cyanosis of digits and no clubbing  PSYCH: alert & oriented x 3 with  fluent speech NEURO: no focal motor/sensory deficits  LABORATORY DATA:  I have reviewed the data as listed Lab Results  Component Value Date   WBC 4.8 01/02/2015   HGB 13.5 01/02/2015   HCT 41.1 01/02/2015   MCV 90.0 01/02/2015   PLT 169 01/02/2015    Recent Labs  04/05/14 1105  NA 140  K 4.0  CL 105  CO2 22  GLUCOSE 81  BUN 20  CREATININE 0.65  CALCIUM 8.9  GFRNONAA 88*  GFRAA >90    ASSESSMENT & PLAN:  Leukopenia The cause is unknown, likely benign. The last 2 CBC showed normal white blood cell count I will discharge the patient from the office and reassure her that the  leukopenia is likely benign in nature   No orders of the defined types were placed in this encounter.    All questions were answered. The patient knows to call the clinic with any problems, questions or concerns. I spent 15 minutes counseling the patient face to face. The total time spent in the appointment was 20 minutes and more than 50% was on counseling.     [facility], [place], MD 01/02/2015 12:51 PM

## 2015-01-03 LAB — SEDIMENTATION RATE: Sed Rate: 1 mm/hr (ref 0–30)

## 2015-01-09 ENCOUNTER — Other Ambulatory Visit: Payer: Self-pay

## 2015-01-09 DIAGNOSIS — Z1231 Encounter for screening mammogram for malignant neoplasm of breast: Secondary | ICD-10-CM

## 2015-01-28 DIAGNOSIS — D485 Neoplasm of uncertain behavior of skin: Secondary | ICD-10-CM | POA: Diagnosis not present

## 2015-01-28 DIAGNOSIS — L710 Perioral dermatitis: Secondary | ICD-10-CM | POA: Diagnosis not present

## 2015-02-03 DIAGNOSIS — M810 Age-related osteoporosis without current pathological fracture: Secondary | ICD-10-CM | POA: Diagnosis not present

## 2015-02-03 DIAGNOSIS — Z0000 Encounter for general adult medical examination without abnormal findings: Secondary | ICD-10-CM | POA: Diagnosis not present

## 2015-02-10 DIAGNOSIS — R252 Cramp and spasm: Secondary | ICD-10-CM | POA: Diagnosis not present

## 2015-02-10 DIAGNOSIS — Z0000 Encounter for general adult medical examination without abnormal findings: Secondary | ICD-10-CM | POA: Diagnosis not present

## 2015-02-10 DIAGNOSIS — M5090 Cervical disc disorder, unspecified, unspecified cervical region: Secondary | ICD-10-CM | POA: Diagnosis not present

## 2015-02-10 DIAGNOSIS — H209 Unspecified iridocyclitis: Secondary | ICD-10-CM | POA: Diagnosis not present

## 2015-02-10 DIAGNOSIS — Z6821 Body mass index (BMI) 21.0-21.9, adult: Secondary | ICD-10-CM | POA: Diagnosis not present

## 2015-02-10 DIAGNOSIS — M797 Fibromyalgia: Secondary | ICD-10-CM | POA: Diagnosis not present

## 2015-02-10 DIAGNOSIS — G43909 Migraine, unspecified, not intractable, without status migrainosus: Secondary | ICD-10-CM | POA: Diagnosis not present

## 2015-02-10 DIAGNOSIS — Z1389 Encounter for screening for other disorder: Secondary | ICD-10-CM | POA: Diagnosis not present

## 2015-02-10 DIAGNOSIS — M810 Age-related osteoporosis without current pathological fracture: Secondary | ICD-10-CM | POA: Diagnosis not present

## 2015-02-10 DIAGNOSIS — Q631 Lobulated, fused and horseshoe kidney: Secondary | ICD-10-CM | POA: Diagnosis not present

## 2015-02-10 DIAGNOSIS — M1990 Unspecified osteoarthritis, unspecified site: Secondary | ICD-10-CM | POA: Diagnosis not present

## 2015-02-10 DIAGNOSIS — H539 Unspecified visual disturbance: Secondary | ICD-10-CM | POA: Diagnosis not present

## 2015-02-11 ENCOUNTER — Ambulatory Visit: Payer: 59

## 2015-02-27 ENCOUNTER — Ambulatory Visit
Admission: RE | Admit: 2015-02-27 | Discharge: 2015-02-27 | Disposition: A | Discharge status: Home / Self Care | Payer: 59 | Source: Ambulatory Visit

## 2015-02-27 DIAGNOSIS — Z1231 Encounter for screening mammogram for malignant neoplasm of breast: Secondary | ICD-10-CM

## 2015-03-19 DIAGNOSIS — Q631 Lobulated, fused and horseshoe kidney: Secondary | ICD-10-CM | POA: Diagnosis not present

## 2015-03-19 DIAGNOSIS — N1330 Unspecified hydronephrosis: Secondary | ICD-10-CM | POA: Diagnosis not present

## 2015-03-19 DIAGNOSIS — Z8042 Family history of malignant neoplasm of prostate: Secondary | ICD-10-CM | POA: Diagnosis not present

## 2015-03-19 DIAGNOSIS — Z8249 Family history of ischemic heart disease and other diseases of the circulatory system: Secondary | ICD-10-CM | POA: Diagnosis not present

## 2015-03-19 DIAGNOSIS — N200 Calculus of kidney: Secondary | ICD-10-CM | POA: Diagnosis not present

## 2015-03-19 DIAGNOSIS — Z801 Family history of malignant neoplasm of trachea, bronchus and lung: Secondary | ICD-10-CM | POA: Diagnosis not present

## 2015-03-19 DIAGNOSIS — J329 Chronic sinusitis, unspecified: Secondary | ICD-10-CM | POA: Diagnosis not present

## 2015-03-19 DIAGNOSIS — M797 Fibromyalgia: Secondary | ICD-10-CM | POA: Diagnosis not present

## 2015-03-19 DIAGNOSIS — Z87442 Personal history of urinary calculi: Secondary | ICD-10-CM | POA: Diagnosis not present

## 2015-03-19 DIAGNOSIS — Z833 Family history of diabetes mellitus: Secondary | ICD-10-CM | POA: Diagnosis not present

## 2015-04-02 DIAGNOSIS — H20022 Recurrent acute iridocyclitis, left eye: Secondary | ICD-10-CM | POA: Diagnosis not present

## 2015-04-16 DIAGNOSIS — H20022 Recurrent acute iridocyclitis, left eye: Secondary | ICD-10-CM | POA: Diagnosis not present

## 2015-07-15 DIAGNOSIS — J40 Bronchitis, not specified as acute or chronic: Secondary | ICD-10-CM | POA: Diagnosis not present

## 2015-07-15 DIAGNOSIS — Z6822 Body mass index (BMI) 22.0-22.9, adult: Secondary | ICD-10-CM | POA: Diagnosis not present

## 2015-07-15 DIAGNOSIS — J029 Acute pharyngitis, unspecified: Secondary | ICD-10-CM | POA: Diagnosis not present

## 2015-07-24 DIAGNOSIS — M545 Low back pain: Secondary | ICD-10-CM | POA: Diagnosis not present

## 2015-08-22 DIAGNOSIS — L708 Other acne: Secondary | ICD-10-CM | POA: Diagnosis not present

## 2015-08-22 DIAGNOSIS — D225 Melanocytic nevi of trunk: Secondary | ICD-10-CM | POA: Diagnosis not present

## 2015-08-22 DIAGNOSIS — L821 Other seborrheic keratosis: Secondary | ICD-10-CM | POA: Diagnosis not present

## 2015-08-22 DIAGNOSIS — L812 Freckles: Secondary | ICD-10-CM | POA: Diagnosis not present

## 2015-11-03 DIAGNOSIS — J019 Acute sinusitis, unspecified: Secondary | ICD-10-CM | POA: Diagnosis not present

## 2015-11-03 DIAGNOSIS — R05 Cough: Secondary | ICD-10-CM | POA: Diagnosis not present

## 2016-01-10 LAB — TSH: TSH: 1.92 u[IU]/mL (ref 0.41–5.90)

## 2016-01-14 DIAGNOSIS — M816 Localized osteoporosis [Lequesne]: Secondary | ICD-10-CM | POA: Diagnosis not present

## 2016-01-14 DIAGNOSIS — Z13820 Encounter for screening for osteoporosis: Secondary | ICD-10-CM | POA: Diagnosis not present

## 2016-01-14 DIAGNOSIS — Z01419 Encounter for gynecological examination (general) (routine) without abnormal findings: Secondary | ICD-10-CM | POA: Diagnosis not present

## 2016-01-14 DIAGNOSIS — Z6822 Body mass index (BMI) 22.0-22.9, adult: Secondary | ICD-10-CM | POA: Diagnosis not present

## 2016-01-14 DIAGNOSIS — N958 Other specified menopausal and perimenopausal disorders: Secondary | ICD-10-CM | POA: Diagnosis not present

## 2016-01-14 LAB — PTH INTACT, FINE NEEDLE ASPIRATION
Calcium: 9 mg/dL
PTH Interp: 40

## 2016-01-14 LAB — HM DEXA SCAN

## 2016-01-14 LAB — THYROID PANEL
Free Thyroxine Index: 2.3
T3 Uptake: 30
T4, Total: 7.7
T4,Free (Direct): 1.2

## 2016-01-14 LAB — VITAMIN D 25 HYDROXY (VIT D DEFICIENCY, FRACTURES): Vit D, 25-Hydroxy: 36

## 2016-01-28 DIAGNOSIS — H20022 Recurrent acute iridocyclitis, left eye: Secondary | ICD-10-CM | POA: Diagnosis not present

## 2016-01-28 DIAGNOSIS — H02054 Trichiasis without entropian left upper eyelid: Secondary | ICD-10-CM | POA: Diagnosis not present

## 2016-02-02 ENCOUNTER — Other Ambulatory Visit: Payer: Self-pay | Admitting: Family Medicine

## 2016-02-02 DIAGNOSIS — Z1231 Encounter for screening mammogram for malignant neoplasm of breast: Secondary | ICD-10-CM

## 2016-02-11 DIAGNOSIS — M5136 Other intervertebral disc degeneration, lumbar region: Secondary | ICD-10-CM | POA: Diagnosis not present

## 2016-02-12 ENCOUNTER — Encounter: Payer: Self-pay | Admitting: Family Medicine

## 2016-02-12 ENCOUNTER — Ambulatory Visit (INDEPENDENT_AMBULATORY_CARE_PROVIDER_SITE_OTHER): Payer: 59 | Admitting: Family Medicine

## 2016-02-12 VITALS — BP 107/60 | HR 70 | Resp 16 | Ht 66.5 in | Wt 137.0 lb

## 2016-02-12 DIAGNOSIS — M549 Dorsalgia, unspecified: Secondary | ICD-10-CM

## 2016-02-12 DIAGNOSIS — D72819 Decreased white blood cell count, unspecified: Secondary | ICD-10-CM

## 2016-02-12 DIAGNOSIS — G8929 Other chronic pain: Secondary | ICD-10-CM | POA: Insufficient documentation

## 2016-02-12 DIAGNOSIS — E876 Hypokalemia: Secondary | ICD-10-CM | POA: Diagnosis not present

## 2016-02-12 DIAGNOSIS — K219 Gastro-esophageal reflux disease without esophagitis: Secondary | ICD-10-CM

## 2016-02-12 DIAGNOSIS — M797 Fibromyalgia: Secondary | ICD-10-CM | POA: Diagnosis not present

## 2016-02-12 DIAGNOSIS — M159 Polyosteoarthritis, unspecified: Secondary | ICD-10-CM

## 2016-02-12 DIAGNOSIS — G43809 Other migraine, not intractable, without status migrainosus: Secondary | ICD-10-CM

## 2016-02-12 DIAGNOSIS — M810 Age-related osteoporosis without current pathological fracture: Secondary | ICD-10-CM | POA: Diagnosis not present

## 2016-02-12 DIAGNOSIS — M150 Primary generalized (osteo)arthritis: Secondary | ICD-10-CM | POA: Diagnosis not present

## 2016-02-12 DIAGNOSIS — I484 Atypical atrial flutter: Secondary | ICD-10-CM | POA: Diagnosis not present

## 2016-02-12 DIAGNOSIS — F411 Generalized anxiety disorder: Secondary | ICD-10-CM

## 2016-02-12 MED ORDER — IBANDRONATE SODIUM 150 MG PO TABS: 150.0000 mg | ORAL_TABLET | ORAL | Status: DC

## 2016-02-12 MED ORDER — MELOXICAM 7.5 MG PO TABS: 7.5000 mg | ORAL_TABLET | Freq: Two times a day (BID) | ORAL | Status: DC

## 2016-02-12 NOTE — Progress Notes (Signed)
[name], D.O. Primary care at [facility]:    Chief Complaint  Patient presents with  . Establish Care   New pt, here to establish care.   HPI: Leslie Dixon is a pleasant 70 y.o. female who presents to [facility] today To establish and patient needs refill of her Mobic and Boniva.   Considers herself in good health- main problem is her spine and back problems which she sees ortho / Interventional pain for.   52 yrs married- happily. Daughter is a NP.  Reads, garden   Pt's Past Medical history reviewed with her in full today.  Dr Ardeth Perfect was prior PCP- [facility]. 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   Aunt- breast CA Father- lung CA- smoked all his life,  Mother- cerebral spinal degeneration No fam h/o DM, HTN   Past Medical History  Diagnosis Date  . Leukopenia   . Sinusitis     f/u with Dr. Janace Hoard.   . Chronic back pain   . Migraine     OCCULAR MIGRAINES - AURA WITH EYES  . Pain     LUMBAR PAIN - RECENT FALL BECAUSE LEGS GAVE OUT - PT HAS HAD LUMBAR INJECTION SINCE THE FALL THAT HAS HELPED BACK PAIN  . Blood dyscrasia     LEUKOPENIA- DR. CHISM - NO TREATMENT BUT IS MONITORED  . Horseshoe kidney     BILATERAL  . History of kidney stones   . Pain     CHRONIC RIGHT UPPER QUADRANT PAIN - RELATED TO GALLBLADDER PROBLEM  . 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 GERD (gastroesophageal reflux disease)     PRILOSEC PRN  . Osteoporosis   . Fibromyalgia   . Sciatica of right side   . Chronic back pain       Past Surgical History  Procedure Laterality Date  . Right knee      ARTHROSCOPY  . Lumbar/cervical surgeries      CERVICAL FUSION C7-C6 WITH  BONE GRAFT; C6-C5 WITH PLATE AND 4 SCREWS;  3 LUMBAR SURGERIES BUT NO FUSION  . Bilateral salpingoophorectomy  2000  . Abdominal hysterectomy  1983  . Nasal sinus surgery  2010  . Wisdom tooth pulled    . Appendectomy  1968  . Cholecystectomy N/A 04/12/2014    Procedure: LAPAROSCOPIC CHOLECYSTECTOMY WITH INTRAOPERATIVE CHOLANGIOGRAM;  Surgeon: Kaylyn Lim, MD;  Location: [facility];  Service: General;  Laterality: N/A;      Family History  Problem Relation Age of Onset  . Cancer Father     Lung  . Migraines Sister   . Neurodegenerative disease Mother   . Cancer Brother     prostate  . Cancer Maternal Aunt     breast cancer  . Cancer Maternal Grandfather     Leukemia      History  Drug Use No  ,    History  Alcohol Use No    Comment: occassionally  ,    History  Smoking status  . Never Smoker   Smokeless tobacco  . Never Used  ,     History  Sexual Activity  . Sexual Activity: Not on file      Patient's Medications  New Prescriptions   No medications on file  Previous Medications   CARBOXYMETHYLCELLULOSE (REFRESH PLUS) 0.5 % SOLN    Place 1-2 drops into both eyes daily as needed (dry eyes.).   CHOLECALCIFEROL (VITAMIN D) 1000 UNITS TABLET    Take 2,000 Units by mouth daily.    CYCLOBENZAPRINE (FLEXERIL) 10 MG TABLET    Take 10 mg by mouth 3 (three) times daily as needed for muscle spasms.   IBANDRONATE (BONIVA) 150 MG TABLET    Take 150 mg by mouth every 30 (thirty) days. Take in the morning with a full glass of water, on an empty stomach, and do not take anything else by mouth or lie down for the next 30 min.   LORATADINE (CLARITIN) 10 MG TABLET    Take by mouth.   MELOXICAM (MOBIC) 7.5 MG TABLET    Take 7.5 mg by mouth 2 (two) times daily.   OMEPRAZOLE (PRILOSEC PO)    Take by mouth. ONE IF NEEDED   TRAMADOL (ULTRAM) 50 MG TABLET    Take 1 tablet (50 mg total) by mouth every 6 (six) hours as needed for moderate pain.   VITAMIN B-12 (CYANOCOBALAMIN) 1000  MCG TABLET    Take 1,000 mcg by mouth daily.   VITAMIN C (ASCORBIC ACID) 500 MG TABLET    Take 500 mg by mouth daily.   VITAMIN E 400 UNIT CAPSULE    Take 400 Units by mouth daily.  Modified Medications   No medications on file  Discontinued Medications   ACETAMINOPHEN (TYLENOL) 500 MG TABLET    Take 500 mg by mouth every 6 (six) hours as needed for pain.     Clarithromycin; Codeine; Other; Penicillins; Pregabalin; and Sulfonamide derivatives Outpatient Encounter Prescriptions as of 02/12/2016  Medication Sig Note  . carboxymethylcellulose (REFRESH PLUS) 0.5 % SOLN Place 1-2 drops into both eyes daily as needed (dry eyes.).   Marland Kitchen cholecalciferol (VITAMIN D) 1000 UNITS tablet Take 2,000 Units by mouth daily.    . cyclobenzaprine (FLEXERIL) 10 MG tablet Take 10 mg by mouth 3 (three) times daily as needed for muscle spasms.   Marland Kitchen ibandronate (BONIVA) 150 MG tablet Take 150 mg by mouth every 30 (thirty) days. Take in the morning with a full glass of water, on an empty stomach, and do not take anything else by mouth or lie down for the next 30 min.   Marland Kitchen loratadine (CLARITIN) 10 MG tablet Take by mouth. 01/02/2015: Received from: [facility]  . meloxicam (MOBIC) 7.5 MG tablet Take 7.5 mg by mouth 2 (two) times daily.   . Omeprazole (PRILOSEC PO) Take by mouth. ONE IF NEEDED   . traMADol (ULTRAM) 50 MG tablet Take 1 tablet (50 mg total) by mouth every 6 (six) hours as needed for moderate pain.   . vitamin B-12 (CYANOCOBALAMIN) 1000 MCG tablet Take 1,000 mcg by mouth daily.   . vitamin C (ASCORBIC ACID) 500 MG tablet Take 500 mg by mouth daily.   . vitamin E 400 UNIT capsule Take 400 Units by mouth daily.   . [DISCONTINUED] acetaminophen (TYLENOL) 500 MG tablet Take 500 mg by mouth every 6 (six) hours as needed for pain.    No facility-administered encounter medications on file as of 02/12/2016.    Immunization History  Administered Date(s) Administered  . Influenza Whole 04/30/2004,  04/26/2007  . Pneumococcal Polysaccharide-23 04/30/2004  .  Td 11/19/2003     Review of Systems:   ( Completed via Adult Medical History Intake form today ) General:   Denies fever, chills, appetite changes, unexplained weight loss.  Optho/Auditory:   Denies visual changes, blurred vision/LOV, ringing in ears/ diff hearing Respiratory:   Denies SOB, DOE, cough, wheezing. + hay fever sx/ allergies Cardiovascular:   Denies chest pain, palpitations, new onset peripheral edema  Gastrointestinal:   Denies nausea, vomiting, diarrhea.  Genitourinary:    Denies dysuria, increased frequency, flank pain.  Endocrine:     Denies hot or cold intolerance, polyuria, polydipsia. Musculoskeletal:  + jt pains in spine/ lower back Skin:  Denies rash, suspicious lesions or new/ changes in moles Neurological:    Denies dizziness, syncope, unexplained weakness, lightheadedness, numbness, h/o HA  Psychiatric/Behavioral:   Denies mood changes, suicidal or homicidal ideations, hallucinations    Objective:   Blood pressure 107/60, pulse 70, resp. rate 16, height 5' 6.5" (1.689 m), weight 137 lb (62.143 kg), SpO2 100 %. Body mass index is 21.78 kg/(m^2).  General: Well Developed, well nourished, and in no acute distress.  Neuro: Alert and oriented x3, extra-ocular muscles intact, sensation grossly intact.  HEENT: Normocephalic, atraumatic, pupils equal round reactive to light Skin: no gross suspicious lesions or rashes  Cardiac: Regular rate and rhythm, no murmurs rubs or gallops.  Respiratory: Essentially clear to auscultation bilaterally. Not using accessory muscles, speaking in full sentences.  Abdominal: Soft, not grossly distended Musculoskeletal: Ambulates w/o diff, FROM * 4 ext.  Vasc: less 2 sec cap RF, warm and pink  Psych:  No HI/SI, judgement and insight good.    Impression and Recommendations:    The patient was counseled, risk factors were discussed, anticipatory guidance given.  Call or  return to clinic prn if these symptoms worsen or fail to improve as anticipated.   1. Gastroesophageal reflux disease, esophagitis presence not specified   2. Primary osteoarthritis involving multiple joints   3. Osteoporosis   4. Fibromyalgia   5. HYPOKALEMIA   6. Leukopenia   7. Other type of migraine   8. Atypical atrial flutter (HCC)   9. h/o GAD (had panic in past)     Gross side effects, risk and benefits, and alternatives of medications discussed with patient.  Patient is aware that all medications have potential side effects and we are unable to predict every side effect or drug-drug interaction that may occur.  Expresses verbal understanding and consents to current therapy plan and treatment regimen.  Please see AVS handed out to patient at the end of our visit for further patient instructions/ counseling done pertaining to today's office visit.    Note: This document was prepared using Dragon voice recognition software and may include unintentional dictation errors.

## 2016-02-12 NOTE — Patient Instructions (Signed)

## 2016-02-20 ENCOUNTER — Other Ambulatory Visit (INDEPENDENT_AMBULATORY_CARE_PROVIDER_SITE_OTHER): Payer: 59

## 2016-02-20 DIAGNOSIS — G43809 Other migraine, not intractable, without status migrainosus: Secondary | ICD-10-CM

## 2016-02-20 DIAGNOSIS — M810 Age-related osteoporosis without current pathological fracture: Secondary | ICD-10-CM | POA: Diagnosis not present

## 2016-02-20 DIAGNOSIS — K219 Gastro-esophageal reflux disease without esophagitis: Secondary | ICD-10-CM

## 2016-02-20 DIAGNOSIS — M159 Polyosteoarthritis, unspecified: Secondary | ICD-10-CM

## 2016-02-20 DIAGNOSIS — E876 Hypokalemia: Secondary | ICD-10-CM | POA: Diagnosis not present

## 2016-02-20 DIAGNOSIS — F411 Generalized anxiety disorder: Secondary | ICD-10-CM | POA: Diagnosis not present

## 2016-02-20 DIAGNOSIS — D72819 Decreased white blood cell count, unspecified: Secondary | ICD-10-CM | POA: Diagnosis not present

## 2016-02-20 DIAGNOSIS — M150 Primary generalized (osteo)arthritis: Secondary | ICD-10-CM

## 2016-02-20 DIAGNOSIS — I484 Atypical atrial flutter: Secondary | ICD-10-CM

## 2016-02-20 DIAGNOSIS — M797 Fibromyalgia: Secondary | ICD-10-CM | POA: Diagnosis not present

## 2016-02-20 LAB — POCT GLYCOSYLATED HEMOGLOBIN (HGB A1C): Hemoglobin A1C: 5.4

## 2016-02-24 LAB — CBC WITH DIFFERENTIAL/PLATELET

## 2016-02-24 LAB — LIPID PANEL
CHOL/HDL RATIO: 2.1 ratio (ref <=5.0)
CHOLESTEROL: 184 mg/dL (ref 125–200)
HDL: 89 mg/dL (ref >=46)
LDL Cholesterol: 86 mg/dL (ref <130)
TRIGLYCERIDES: 46 mg/dL (ref <150)
VLDL: 9 mg/dL (ref <30)

## 2016-02-24 LAB — COMPLETE METABOLIC PANEL WITH GFR
ALBUMIN: 4.2 g/dL (ref 3.6–5.1)
ALK PHOS: 60 U/L (ref 33–130)
ALT: 12 U/L (ref 6–29)
AST: 15 U/L (ref 10–35)
BILIRUBIN TOTAL: 0.5 mg/dL (ref 0.2–1.2)
BUN: 19 mg/dL (ref 7–25)
CO2: 24 mmol/L (ref 20–31)
Calcium: 9.2 mg/dL (ref 8.6–10.4)
Chloride: 109 mmol/L (ref 98–110)
Creat: 0.76 mg/dL (ref 0.60–0.93)
GFR, EST NON AFRICAN AMERICAN: 79 mL/min (ref >=60)
GFR, Est African American: >89 mL/min (ref >=60)
Glucose, Bld: 87 mg/dL (ref 65–99)
POTASSIUM: 5.2 mmol/L (ref 3.5–5.3)
SODIUM: 142 mmol/L (ref 135–146)
Total Protein: 6.5 g/dL (ref 6.1–8.1)

## 2016-02-24 LAB — VITAMIN D 25 HYDROXY (VIT D DEFICIENCY, FRACTURES): Vit D, 25-Hydroxy: 36 ng/mL (ref 30–100)

## 2016-02-25 ENCOUNTER — Other Ambulatory Visit: Payer: Self-pay

## 2016-02-25 DIAGNOSIS — D72819 Decreased white blood cell count, unspecified: Secondary | ICD-10-CM

## 2016-02-27 ENCOUNTER — Other Ambulatory Visit (INDEPENDENT_AMBULATORY_CARE_PROVIDER_SITE_OTHER): Payer: 59

## 2016-02-27 DIAGNOSIS — D72819 Decreased white blood cell count, unspecified: Secondary | ICD-10-CM

## 2016-02-28 LAB — CBC WITH DIFFERENTIAL/PLATELET
BASOS ABS: 0 {cells}/uL (ref 0–200)
Basophils Relative: 0 %
EOS ABS: 86 {cells}/uL (ref 15–500)
Eosinophils Relative: 2 %
HCT: 42.4 % (ref 35.0–45.0)
Hemoglobin: 14 g/dL (ref 11.7–15.5)
LYMPHS PCT: 42 %
Lymphs Abs: 1806 cells/uL (ref 850–3900)
MCH: 30.2 pg (ref 27.0–33.0)
MCHC: 33 g/dL (ref 32.0–36.0)
MCV: 91.4 fL (ref 80.0–100.0)
MPV: 10.7 fL (ref 7.5–12.5)
Monocytes Absolute: 258 cells/uL (ref 200–950)
Monocytes Relative: 6 %
NEUTROS ABS: 2150 {cells}/uL (ref 1500–7800)
Neutrophils Relative %: 50 %
PLATELETS: 158 10*3/uL (ref 140–400)
RBC: 4.64 MIL/uL (ref 3.80–5.10)
RDW: 16.2 % — ABNORMAL HIGH (ref 11.0–15.0)
WBC: 4.3 10*3/uL (ref 3.8–10.8)

## 2016-03-01 ENCOUNTER — Ambulatory Visit
Admission: RE | Admit: 2016-03-01 | Discharge: 2016-03-01 | Disposition: A | Discharge status: Home / Self Care | Payer: 59 | Source: Ambulatory Visit | Attending: Family Medicine | Admitting: Family Medicine

## 2016-03-01 DIAGNOSIS — Z1231 Encounter for screening mammogram for malignant neoplasm of breast: Secondary | ICD-10-CM

## 2016-03-03 NOTE — Progress Notes (Signed)
  I will discuss the results of these tests with the patient at our upcoming planned follow-up office visit.    (However Leslie Dixon, if patient is a no-show, please send them a letter with these results.  Thank you!)

## 2016-03-05 ENCOUNTER — Encounter: Payer: Self-pay | Admitting: Family Medicine

## 2016-03-05 ENCOUNTER — Ambulatory Visit (INDEPENDENT_AMBULATORY_CARE_PROVIDER_SITE_OTHER): Payer: 59 | Admitting: Family Medicine

## 2016-03-05 VITALS — BP 100/64 | HR 67 | Wt 136.8 lb

## 2016-03-05 DIAGNOSIS — M159 Polyosteoarthritis, unspecified: Secondary | ICD-10-CM

## 2016-03-05 DIAGNOSIS — M810 Age-related osteoporosis without current pathological fracture: Secondary | ICD-10-CM | POA: Diagnosis not present

## 2016-03-05 DIAGNOSIS — E876 Hypokalemia: Secondary | ICD-10-CM

## 2016-03-05 DIAGNOSIS — M150 Primary generalized (osteo)arthritis: Secondary | ICD-10-CM

## 2016-03-05 DIAGNOSIS — F411 Generalized anxiety disorder: Secondary | ICD-10-CM | POA: Diagnosis not present

## 2016-03-05 MED ORDER — MELOXICAM 7.5 MG PO TABS: 7.5000 mg | ORAL_TABLET | Freq: Every day | ORAL | 0 refills | Status: DC

## 2016-03-05 MED ORDER — IBANDRONATE SODIUM 150 MG PO TABS: 150.0000 mg | ORAL_TABLET | ORAL | 0 refills | Status: DC

## 2016-03-05 MED ORDER — VITAMIN D3 125 MCG (5000 UT) PO TABS: ORAL_TABLET | ORAL | 3 refills | Status: DC

## 2016-03-05 NOTE — Patient Instructions (Signed)
     Mediterranean Diet  Why follow it? Research shows. . Those who follow the Mediterranean diet have a reduced risk of heart disease  . The diet is associated with a reduced incidence of Parkinson's and Alzheimer's diseases . People following the diet may have longer life expectancies and lower rates of chronic diseases  . The Dietary Guidelines for Americans recommends the Mediterranean diet as an eating plan to promote health and prevent disease  What Is the Mediterranean Diet?  . Healthy eating plan based on typical foods and recipes of Mediterranean-style cooking . The diet is primarily a plant based diet; these foods should make up a majority of meals   Starches - Plant based foods should make up a majority of meals - They are an important sources of vitamins, minerals, energy, antioxidants, and fiber - Choose whole grains, foods high in fiber and minimally processed items  - Typical grain sources include wheat, oats, barley, corn, brown rice, bulgar, farro, millet, polenta, couscous  - Various types of beans include chickpeas, lentils, fava beans, black beans, white beans   Fruits  Veggies - Large quantities of antioxidant rich fruits & veggies; 6 or more servings  - Vegetables can be eaten raw or lightly drizzled with oil and cooked  - Vegetables common to the traditional Mediterranean Diet include: artichokes, arugula, beets, broccoli, brussel sprouts, cabbage, carrots, celery, collard greens, cucumbers, eggplant, kale, leeks, lemons, lettuce, mushrooms, okra, onions, peas, peppers, potatoes, pumpkin, radishes, rutabaga, shallots, spinach, sweet potatoes, turnips, zucchini - Fruits common to the Mediterranean Diet include: apples, apricots, avocados, cherries, clementines, dates, figs, grapefruits, grapes, melons, nectarines, oranges, peaches, pears, pomegranates, strawberries, tangerines  Fats - Replace butter and margarine with healthy oils, such as olive oil, canola oil, and  tahini  - Limit nuts to no more than a handful a day  - Nuts include walnuts, almonds, pecans, pistachios, pine nuts  - Limit or avoid candied, honey roasted or heavily salted nuts - Olives are central to the Mediterranean diet - can be eaten whole or used in a variety of dishes   Meats Protein - Limiting red meat: no more than a few times a month - When eating red meat: choose lean cuts and keep the portion to the size of deck of cards - Eggs: approx. 0 to 4 times a week  - Fish and lean poultry: at least 2 a week  - Healthy protein sources include, chicken, turkey, lean beef, lamb - Increase intake of seafood such as tuna, salmon, trout, mackerel, shrimp, scallops - Avoid or limit high fat processed meats such as sausage and bacon  Dairy - Include moderate amounts of low fat dairy products  - Focus on healthy dairy such as fat free yogurt, skim milk, low or reduced fat cheese - Limit dairy products higher in fat such as whole or 2% milk, cheese, ice cream  Alcohol - Moderate amounts of red wine is ok  - No more than 5 oz daily for women (all ages) and men older than age 65  - No more than 10 oz of wine daily for men younger than 65  Other - Limit sweets and other desserts  - Use herbs and spices instead of salt to flavor foods  - Herbs and spices common to the traditional Mediterranean Diet include: basil, bay leaves, chives, cloves, cumin, fennel, garlic, lavender, marjoram, mint, oregano, parsley, pepper, rosemary, sage, savory, sumac, tarragon, thyme   It's not just a diet, it's   a lifestyle:  . The Mediterranean diet includes lifestyle factors typical of those in the region  . Foods, drinks and meals are best eaten with others and savored . Daily physical activity is important for overall good health . This could be strenuous exercise like running and aerobics . This could also be more leisurely activities such as walking, housework, yard-work, or taking the stairs . Moderation is  the key; a balanced and healthy diet accommodates most foods and drinks . Consider portion sizes and frequency of consumption of certain foods   Meal Ideas & Options:  . Breakfast:  o Whole wheat toast or whole wheat English muffins with peanut butter & hard boiled egg o Steel cut oats topped with apples & cinnamon and skim milk  o Fresh fruit: banana, strawberries, melon, berries, peaches  o Smoothies: strawberries, bananas, greek yogurt, peanut butter o Low fat greek yogurt with blueberries and granola  o Egg white omelet with spinach and mushrooms o Breakfast couscous: whole wheat couscous, apricots, skim milk, cranberries  . Sandwiches:  o Hummus and grilled vegetables (peppers, zucchini, squash) on whole wheat bread   o Grilled chicken on whole wheat pita with lettuce, tomatoes, cucumbers or tzatziki  o Tuna salad on whole wheat bread: tuna salad made with greek yogurt, olives, red peppers, capers, green onions o Garlic rosemary lamb pita: lamb sauted with garlic, rosemary, salt & pepper; add lettuce, cucumber, greek yogurt to pita - flavor with lemon juice and black pepper  . Seafood:  o Mediterranean grilled salmon, seasoned with garlic, basil, parsley, lemon juice and black pepper o Shrimp, lemon, and spinach whole-grain pasta salad made with low fat greek yogurt  o Seared scallops with lemon orzo  o Seared tuna steaks seasoned salt, pepper, coriander topped with tomato mixture of olives, tomatoes, olive oil, minced garlic, parsley, green onions and cappers  . Meats:  o Herbed greek chicken salad with kalamata olives, cucumber, feta  o Red bell peppers stuffed with spinach, bulgur, lean ground beef (or lentils) & topped with feta   o Kebabs: skewers of chicken, tomatoes, onions, zucchini, squash  o Turkey burgers: made with red onions, mint, dill, lemon juice, feta cheese topped with roasted red peppers . Vegetarian o Cucumber salad: cucumbers, artichoke hearts, celery, red  onion, feta cheese, tossed in olive oil & lemon juice  o Hummus and whole grain pita points with a greek salad (lettuce, tomato, feta, olives, cucumbers, red onion) o Lentil soup with celery, carrots made with vegetable broth, garlic, salt and pepper  o Tabouli salad: parsley, bulgur, mint, scallions, cucumbers, tomato, radishes, lemon juice, olive oil, salt and pepper.  

## 2016-03-05 NOTE — Progress Notes (Signed)
Assessment and plan:  1. HYPOKALEMIA   2. Anxiety state   3. Osteoporosis   4. Primary osteoarthritis involving multiple joints    Patient's potassium levels are stable.  Mood-well-controlled. Encourage prudent diet and regular exercise of at least 30 minutes daily.  Renewed prescription for Boniva; still waiting on medical records from her last physician of when bone density last performed.  Vitamin D and calcium intake recommend  Encouraged routine weightbearing exercise daily.  Low back refilled  Patient's Medications  New Prescriptions   CHOLECALCIFEROL (VITAMIN D3) 5000 UNITS TABS    5,000 IU OTC vitamin D3 daily.  Previous Medications   CARBOXYMETHYLCELLULOSE (REFRESH PLUS) 0.5 % SOLN    Place 1-2 drops into both eyes daily as needed (dry eyes.).   LORATADINE (CLARITIN) 10 MG TABLET    Take by mouth.   MAGNESIUM 250 MG TABS    Take 1 tablet by mouth daily.   MAGNESIUM 30 MG TABLET    Take 30 mg by mouth 2 (two) times daily.   OMEPRAZOLE (PRILOSEC PO)    Take by mouth. ONE IF NEEDED   TRAMADOL (ULTRAM) 50 MG TABLET    Take 1 tablet (50 mg total) by mouth every 6 (six) hours as needed for moderate pain.   VITAMIN B-12 (CYANOCOBALAMIN) 1000 MCG TABLET    Take 1,000 mcg by mouth daily.   VITAMIN C (ASCORBIC ACID) 500 MG TABLET    Take 500 mg by mouth daily.   VITAMIN E 400 UNIT CAPSULE    Take 400 Units by mouth daily.  Modified Medications   Modified Medication Previous Medication   IBANDRONATE (BONIVA) 150 MG TABLET ibandronate (BONIVA) 150 MG tablet      Take 1 tablet (150 mg total) by mouth every 30 (thirty) days.    Take 1 tablet (150 mg total) by mouth every 30 (thirty) days.   MELOXICAM (MOBIC) 7.5 MG TABLET meloxicam (MOBIC) 7.5 MG tablet      Take 1 tablet (7.5 mg total) by mouth daily.    Take 1 tablet (7.5 mg total) by mouth 2 (two) times daily.  Discontinued Medications   CHOLECALCIFEROL (VITAMIN D)  1000 UNITS TABLET    Take 2,000 Units by mouth daily.    CYCLOBENZAPRINE (FLEXERIL) 10 MG TABLET    Take 10 mg by mouth 3 (three) times daily as needed for muscle spasms.    Return in about 7 weeks (around 04/26/2016) for For wellness exam & health maintenance evaluation.  Anticipatory guidance and routine counseling done re: condition, txmnt options and need for follow up. All questions of patient's were answered.   Gross side effects, risk and benefits, and alternatives of medications discussed with patient.  Patient is aware that all medications have potential side effects and we are unable to predict every sideeffect or drug-drug interaction that may occur.  Expresses verbal understanding and consents to current therapy plan and treatment regiment.  Please see AVS handed out to patient at the end of our visit for additional patient instructions/ counseling done pertaining to today's office visit.  Note: This document was prepared using Dragon voice recognition software and may include unintentional dictation errors.   ----------------------------------------------------------------------------------------------------------------------  Subjective:   CC: Leslie Dixon is a 70 y.o. female who presents to [place] at [facility] today for  Review of labs.    Patient needs her Boniva and meloxicam sent to her long distance pharmacy which does mail order.  We will refill both of those today.  No other Acute complaints   Wt Readings from Last 3 Encounters:  03/05/16 136 lb 12.8 oz (62.1 kg)  02/12/16 137 lb (62.1 kg)  01/02/15 135 lb 14.4 oz (61.6 kg)   BP Readings from Last 3 Encounters:  03/05/16 100/64  02/12/16 107/60  01/02/15 112/62   Pulse Readings from Last 3 Encounters:  03/05/16 67  02/12/16 70  01/02/15 69      Full medical history updated and reviewed in the office today  Patient Active Problem List   Diagnosis Date Noted  . Anxiety state  03/07/2007    Priority: High  . h/o GAD (had panic in past) 02/12/2016  . GERD (gastroesophageal reflux disease)   . Osteoporosis   . Chronic back pain   . Can't get food down 04/10/2012  . Difficulty speaking 04/10/2012  . Hydronephrosis 03/22/2012  . Leukopenia   . Back pain   . Chronic infection of sinus 07/09/2011  . Blood in the urine 05/06/2011  . 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 Calculus of kidney 05/06/2011  . HYPOKALEMIA 01/03/2008  . ALLERGIC RHINITIS DUE TO POLLEN 12/13/2007  . 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 LACTOSE INTOLERANCE 03/07/2007  . COMMON MIGRAINE 03/07/2007  . CARPAL TUNNEL SYNDROME 03/07/2007  . VARICOSE VEIN 03/07/2007  . OA (osteoarthritis) 03/07/2007  . Fibromyalgia 03/07/2007  . NEPHROLITHIASIS, HX OF 03/07/2007    Past Medical History:  Diagnosis Date  . Blood dyscrasia    LEUKOPENIA- DR. CHISM - NO TREATMENT BUT IS MONITORED  . Chronic back pain   . Chronic back pain   . Fibromyalgia   . Foot drop, right    RELATED TO LUMBAR PROBLEMS  . GERD (gastroesophageal reflux disease)    PRILOSEC PRN  . History of kidney stones   . Horseshoe kidney    BILATERAL  . Leukopenia   . Migraine    OCCULAR MIGRAINES - AURA WITH EYES  . Osteoporosis   . Pain    LUMBAR PAIN - RECENT FALL BECAUSE LEGS GAVE OUT - PT HAS HAD LUMBAR INJECTION SINCE THE FALL THAT HAS HELPED BACK PAIN  . Pain    CHRONIC RIGHT UPPER QUADRANT PAIN - RELATED TO GALLBLADDER PROBLEM  . Sciatica of right side   . Sinusitis    f/u with Dr. Janace Hoard.     Past Surgical History:  Procedure Laterality Date  . ABDOMINAL HYSTERECTOMY  1983  . APPENDECTOMY  1968  . BILATERAL SALPINGOOPHORECTOMY  2000  . CHOLECYSTECTOMY N/A 04/12/2014   Procedure: LAPAROSCOPIC CHOLECYSTECTOMY WITH INTRAOPERATIVE CHOLANGIOGRAM;  Surgeon: Kaylyn Lim, MD;  Location: [facility];  Service: General;  Laterality: N/A;  . Lumbar/cervical surgeries     CERVICAL FUSION C7-C6 WITH BONE GRAFT; C6-C5 WITH PLATE AND 4  SCREWS;  3 LUMBAR SURGERIES BUT NO FUSION  . NASAL SINUS SURGERY  2010  . right knee     ARTHROSCOPY  . WISDOM TOOTH PULLED      Social History  Substance Use Topics  . Smoking status: Never Smoker  . Smokeless tobacco: Never Used  . Alcohol use No     Comment: occassionally    family history includes Cancer in her brother, father, maternal aunt, and maternal grandfather; Migraines in her sister; Neurodegenerative disease in her mother.   Medications: Current Outpatient Prescriptions  Medication Sig Dispense Refill  . carboxymethylcellulose (REFRESH PLUS) 0.5 % SOLN Place 1-2 drops into both eyes daily as needed (dry eyes.).    Marland Kitchen  ibandronate (BONIVA) 150 MG tablet Take 1 tablet (150 mg total) by mouth every 30 (thirty) days. 12 tablet 0  . loratadine (CLARITIN) 10 MG tablet Take by mouth.    . Magnesium 250 MG TABS Take 1 tablet by mouth daily.    . magnesium 30 MG tablet Take 30 mg by mouth 2 (two) times daily.    . meloxicam (MOBIC) 7.5 MG tablet Take 1 tablet (7.5 mg total) by mouth daily. 180 tablet 0  . Omeprazole (PRILOSEC PO) Take by mouth. ONE IF NEEDED    . traMADol (ULTRAM) 50 MG tablet Take 1 tablet (50 mg total) by mouth every 6 (six) hours as needed for moderate pain. 20 tablet 0  . vitamin B-12 (CYANOCOBALAMIN) 1000 MCG tablet Take 1,000 mcg by mouth daily.    . vitamin C (ASCORBIC ACID) 500 MG tablet Take 500 mg by mouth daily.    . vitamin E 400 UNIT capsule Take 400 Units by mouth daily.    . Cholecalciferol (VITAMIN D3) 5000 units TABS 5,000 IU OTC vitamin D3 daily. 90 tablet 3   No current facility-administered medications for this visit.     Allergies:  Allergies  Allergen Reactions  . Peanut Oil Anaphylaxis  . Clarithromycin     REACTION: GI  . Codeine     REACTION: nausea and vomiting  . Lactose     Other reaction(s): GI Upset (intolerance)  . 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 Penicillins     REACTION:  per allergy testing/Patient states she has taken Augmentin without complications.   . Pregabalin     REACTION: headache, several side effects  . Sulfonamide Derivatives     REACTION: Hives, wheezing     ROS:  Const:    no fevers, chills Eyes:    conjunctiva clear, no vision changes or blurred vision ENT:  no hearing difficulties, no dysphagia, no dysphonia, no nose bleeds CV:   no chest pain, arrhythmias, no orthopnea, no PND Pulm:   no SOB at rest or exertion, no Wheeze, no DIB, no hemoptysis GI:    no N/V/D/C, no abd pain GU:   no blood in urine or inc freq or urgency Heme/Onc:    no unexplained bleeding, no night sweats, no more fatigue than usual Neuro:   No dizziness, no LOC, No unexplained weakness or numbness Endo:   no unexplained wt loss or gain M-Sk:   no localized myalgias or arthralgias Psych:    No SI/HI, no memory prob or unexplained confusion    Objective:  Blood pressure 100/64, pulse 67, weight 136 lb 12.8 oz (62.1 kg). Body mass index is 21.75 kg/m.  Gen: Well NAD, A and O *3 HEENT: [place]/AT, EOMI,  MMM, OP- clr Lungs: Normal work of breathing. CTA B/L, no Wh, rhonchi Heart: RRR, S1, S2 WNL's, no MRG Abd: Soft. No gross distention Exts: warm, pink,  Brisk capillary refill, warm and well perfused.    Recent Results (from the past 2160 hour(s))  TSH     Status: None   Collection Time: 01/10/16 12:00 AM  Result Value Ref Range   TSH 1.92 0.41 - 5.90 uIU/mL  HM DEXA SCAN     Status: None   Collection Time: 01/14/16 12:00 AM  Result Value Ref Range   HM Dexa Scan Osteoporosis   Thyroid Profile     Status: None   Collection Time: 01/14/16 12:00 AM  Result Value Ref  Range   T4,Free (Direct) 1.2    T3 Uptake 30    Free Thyroxine Index 2.3    T4, Total 7.7   VITAMIN D 25 Hydroxy (Vit-D Deficiency, Fractures)     Status: None   Collection Time: 01/14/16 12:00 AM  Result Value Ref Range   Vit D, 25-Hydroxy 36   PTH intact, fine needle aspiration      Status: None   Collection Time: 01/14/16 12:00 AM  Result Value Ref Range   PTH 40    Calcium 9.0 mg/dL  COMPLETE METABOLIC PANEL WITH GFR     Status: None   Collection Time: 02/20/16  7:58 AM  Result Value Ref Range   Sodium 142 135 - 146 mmol/L   Potassium 5.2 3.5 - 5.3 mmol/L   Chloride 109 98 - 110 mmol/L   CO2 24 20 - 31 mmol/L   Glucose, Bld 87 65 - 99 mg/dL   BUN 19 7 - 25 mg/dL   Creat 0.76 0.60 - 0.93 mg/dL    Comment:   For patients > or = 72 years of age: The upper reference limit for Creatinine is approximately 13% higher for people identified as African-American.      Total Bilirubin 0.5 0.2 - 1.2 mg/dL   Alkaline Phosphatase 60 33 - 130 U/L   AST 15 10 - 35 U/L   ALT 12 6 - 29 U/L   Total Protein 6.5 6.1 - 8.1 g/dL   Albumin 4.2 3.6 - 5.1 g/dL   Calcium 9.2 8.6 - 10.4 mg/dL   GFR, Est African American >89 >=60 mL/min   GFR, Est Non African American 79 >=60 mL/min  CBC with Differential/Platelet     Status: None   Collection Time: 02/20/16  7:58 AM  Result Value Ref Range   WBC TEST NOT PERFORMED 3.8 - 10.8 K/uL    Comment: Test not performed. Unsuitable for analysis due to age of the specimen.    RBC TEST NOT PERFORMED 3.80 - 5.10 MIL/uL   Hemoglobin TEST NOT PERFORMED 11.7 - 15.5 g/dL   HCT TEST NOT PERFORMED 35.0 - 45.0 %   MCV TEST NOT PERFORMED 80.0 - 100.0 fL   MCH TEST NOT PERFORMED 27.0 - 33.0 pg   MCHC TEST NOT PERFORMED 32.0 - 36.0 g/dL   RDW TEST NOT PERFORMED 11.0 - 15.0 %   Platelets TEST NOT PERFORMED 140 - 400 K/uL   MPV TEST NOT PERFORMED 7.5 - 12.5 fL   Neutro Abs TEST NOT PERFORMED 1500 - 7800 cells/uL   Lymphs Abs TEST NOT PERFORMED 850 - 3900 cells/uL   Monocytes Absolute TEST NOT PERFORMED 200 - 950 cells/uL   Eosinophils Absolute TEST NOT PERFORMED 15 - 500 cells/uL   Basophils Absolute TEST NOT PERFORMED 0 - 200 cells/uL   Neutrophils Relative % TEST NOT PERFORMED %   Lymphocytes Relative TEST NOT PERFORMED %   Monocytes  Relative TEST NOT PERFORMED %   Eosinophils Relative TEST NOT PERFORMED %   Basophils Relative TEST NOT PERFORMED %   Smear Review TEST NOT PERFORMED     Comment: ** Please note change in unit of measure and reference range(s). **  Lipid panel     Status: None   Collection Time: 02/20/16  7:58 AM  Result Value Ref Range   Cholesterol 184 125 - 200 mg/dL   Triglycerides 46 <150 mg/dL   HDL 89 >=46 mg/dL   Total CHOL/HDL Ratio 2.1 <=5.0 Ratio  VLDL 9 <30 mg/dL   LDL Cholesterol 86 <130 mg/dL    Comment:   Total Cholesterol/HDL Ratio:CHD Risk                        Coronary Heart Disease Risk Table                                        Men       Women          1/2 Average Risk              3.4        3.3              Average Risk              5.0        4.4           2X Average Risk              9.6        7.1           3X Average Risk             23.4       11.0 Use the calculated Patient Ratio above and the CHD Risk table  to determine the patient's CHD Risk.   VITAMIN D 25 Hydroxy (Vit-D Deficiency, Fractures)     Status: None   Collection Time: 02/20/16  7:58 AM  Result Value Ref Range   Vit D, 25-Hydroxy 36 30 - 100 ng/mL    Comment: Vitamin D Status           25-OH Vitamin D        Deficiency                <20 ng/mL        Insufficiency         20 - 29 ng/mL        Optimal             > or = 30 ng/mL   For 25-OH Vitamin D testing on patients on D2-supplementation and patients for whom quantitation of D2 and D3 fractions is required, the [facility] 25-OH VIT D, (D2,D3), LC/MS/MS is recommended: order code [id number] (patients > 2 yrs).   POCT glycosylated hemoglobin (Hb A1C)     Status: Normal   Collection Time: 02/20/16  9:27 AM  Result Value Ref Range   Hemoglobin A1C 5.4   CBC w/Diff     Status: Abnormal   Collection Time: 02/27/16  9:08 AM  Result Value Ref Range   WBC 4.3 3.8 - 10.8 K/uL   RBC 4.64 3.80 - 5.10 MIL/uL   Hemoglobin 14.0 11.7 - 15.5 g/dL   HCT  42.4 35.0 - 45.0 %   MCV 91.4 80.0 - 100.0 fL   MCH 30.2 27.0 - 33.0 pg   MCHC 33.0 32.0 - 36.0 g/dL   RDW 16.2 (H) 11.0 - 15.0 %   Platelets 158 140 - 400 K/uL   MPV 10.7 7.5 - 12.5 fL   Neutro Abs 2,150 1,500 - 7,800 cells/uL   Lymphs Abs 1,806 850 - 3,900 cells/uL   Monocytes Absolute 258 200 - 950 cells/uL   Eosinophils Absolute 86 15 - 500 cells/uL   Basophils Absolute 0 0 - 200  cells/uL   Neutrophils Relative % 50 %   Lymphocytes Relative 42 %   Monocytes Relative 6 %   Eosinophils Relative 2 %   Basophils Relative 0 %   Smear Review Criteria for review not met     Comment: ** Please note change in unit of measure and reference range(s). **

## 2016-04-08 DIAGNOSIS — N1330 Unspecified hydronephrosis: Secondary | ICD-10-CM | POA: Diagnosis not present

## 2016-04-08 DIAGNOSIS — N200 Calculus of kidney: Secondary | ICD-10-CM | POA: Diagnosis not present

## 2016-04-08 DIAGNOSIS — Q631 Lobulated, fused and horseshoe kidney: Secondary | ICD-10-CM | POA: Diagnosis not present

## 2016-04-15 DIAGNOSIS — M659 Synovitis and tenosynovitis, unspecified: Secondary | ICD-10-CM | POA: Diagnosis not present

## 2016-04-15 DIAGNOSIS — M79672 Pain in left foot: Secondary | ICD-10-CM | POA: Diagnosis not present

## 2016-04-20 ENCOUNTER — Encounter: Payer: Self-pay | Admitting: Family Medicine

## 2016-04-20 ENCOUNTER — Ambulatory Visit (INDEPENDENT_AMBULATORY_CARE_PROVIDER_SITE_OTHER): Payer: 59 | Admitting: Family Medicine

## 2016-04-20 VITALS — BP 115/65 | HR 84 | Ht 66.0 in | Wt 140.6 lb

## 2016-04-20 DIAGNOSIS — Z0189 Encounter for other specified special examinations: Secondary | ICD-10-CM

## 2016-04-20 DIAGNOSIS — Z1159 Encounter for screening for other viral diseases: Secondary | ICD-10-CM

## 2016-04-20 DIAGNOSIS — J3089 Other allergic rhinitis: Secondary | ICD-10-CM | POA: Diagnosis not present

## 2016-04-20 DIAGNOSIS — Z23 Encounter for immunization: Secondary | ICD-10-CM

## 2016-04-20 DIAGNOSIS — Z0000 Encounter for general adult medical examination without abnormal findings: Secondary | ICD-10-CM

## 2016-04-20 NOTE — Patient Instructions (Addendum)
Do your Nettie pot twice daily and possibly if need be, you can use Flonase/ Nasacort 1 spray each nostril twice daily which will follow your Nettie potting.  Continue claritin daily.      Preventive Care for Adults, Female A healthy lifestyle and preventive care can promote health and wellness. Preventive health guidelines for women include the following key practices.   A routine yearly physical is a good way to check with your health care provider about your health and preventive screening. It is a chance to share any concerns and updates on your health and to receive a thorough exam.   Visit your dentist for a routine exam and preventive care every 6 months. Brush your teeth twice a day and floss once a day. Good oral hygiene prevents tooth decay and gum disease.   The frequency of eye exams is based on your age, health, family medical history, use of contact lenses, and other factors. Follow your health care provider's recommendations for frequency of eye exams.   Eat a healthy diet. Foods like vegetables, fruits, whole grains, low-fat dairy products, and lean protein foods contain the nutrients you need without too many calories. Decrease your intake of foods high in solid fats, added sugars, and salt. Eat the right amount of calories for you.Get information about a proper diet from your health care provider, if necessary.   Regular physical exercise is one of the most important things you can do for your health. Most adults should get at least 150 minutes of moderate-intensity exercise (any activity that increases your heart rate and causes you to sweat) each week. In addition, most adults need muscle-strengthening exercises on 2 or more days a week.   Maintain a healthy weight. The body mass index (BMI) is a screening tool to identify possible weight problems. It provides an estimate of body fat based on height and weight. Your health care provider can find your BMI, and can help you  achieve or maintain a healthy weight.For adults 20 years and older:   - A BMI below 18.5 is considered underweight.   - A BMI of 18.5 to 24.9 is normal.   - A BMI of 25 to 29.9 is considered overweight.   - A BMI of 30 and above is considered obese.   Maintain normal blood lipids and cholesterol levels by exercising and minimizing your intake of trans and saturated fats.  Eat a balanced diet with plenty of fruit and vegetables. Blood tests for lipids and cholesterol should begin at age 29 and be repeated every 5 years minimum.  If your lipid or cholesterol levels are high, you are over 40, or you are at high risk for heart disease, you may need your cholesterol levels checked more frequently.Ongoing high lipid and cholesterol levels should be treated with medicines if diet and exercise are not working.   If you smoke, find out from your health care provider how to quit. If you do not use tobacco, do not start.   Lung cancer screening is recommended for adults aged 55-80 years who are at high risk for developing lung cancer because of a history of smoking. A yearly low-dose CT scan of the lungs is recommended for people who have at least a 30-pack-year history of smoking and are a current smoker or have quit within the past 15 years. A pack year of smoking is smoking an average of 1 pack of cigarettes a day for 1 year (for example: 1 pack a day  for 30 years or 2 packs a day for 15 years). Yearly screening should continue until the smoker has stopped smoking for at least 15 years. Yearly screening should be stopped for people who develop a health problem that would prevent them from having lung cancer treatment.   If you are pregnant, do not drink alcohol. If you are breastfeeding, be very cautious about drinking alcohol. If you are not pregnant and choose to drink alcohol, do not have more than 1 drink per day. One drink is considered to be 12 ounces (355 mL) of beer, 5 ounces (148 mL) of wine, or  1.5 ounces (44 mL) of liquor.   Avoid use of street drugs. Do not share needles with anyone. Ask for help if you need support or instructions about stopping the use of drugs.   High blood pressure causes heart disease and increases the risk of stroke. Your blood pressure should be checked at least yearly.  Ongoing high blood pressure should be treated with medicines if weight loss and exercise do not work.   If you are 57-49 years old, ask your health care provider if you should take aspirin to prevent strokes.   Diabetes screening involves taking a blood sample to check your fasting blood sugar level. This should be done once every 3 years, after age 59, if you are within normal weight and without risk factors for diabetes. Testing should be considered at a younger age or be carried out more frequently if you are overweight and have at least 1 risk factor for diabetes.   Breast cancer screening is essential preventive care for women. You should practice "breast self-awareness."  This means understanding the normal appearance and feel of your breasts and may include breast self-examination.  Any changes detected, no matter how small, should be reported to a health care provider.  Women in their 51s and 30s should have a clinical breast exam (CBE) by a health care provider as part of a regular health exam every 1 to 3 years.  After age 43, women should have a CBE every year.  Starting at age 58, women should consider having a mammogram (breast X-ray test) every year.  Women who have a family history of breast cancer should talk to their health care provider about genetic screening.  Women at a high risk of breast cancer should talk to their health care providers about having an MRI and a mammogram every year.   -Breast cancer gene (BRCA)-related cancer risk assessment is recommended for women who have family members with BRCA-related cancers. BRCA-related cancers include breast, ovarian, tubal, and  peritoneal cancers. Having family members with these cancers may be associated with an increased risk for harmful changes (mutations) in the breast cancer genes BRCA1 and BRCA2. Results of the assessment will determine the need for genetic counseling and BRCA1 and BRCA2 testing.   The Pap test is a screening test for cervical cancer. A Pap test can show cell changes on the cervix that might become cervical cancer if left untreated. A Pap test is a procedure in which cells are obtained and examined from the lower end of the uterus (cervix).   - Women should have a Pap test starting at age 48.   - Between ages 60 and 27, Pap tests should be repeated every 2 years.   - Beginning at age 64, you should have a Pap test every 3 years as long as the past 3 Pap tests have been normal.   -  Some women have medical problems that increase the chance of getting cervical cancer. Talk to your health care provider about these problems. It is especially important to talk to your health care provider if a new problem develops soon after your last Pap test. In these cases, your health care provider may recommend more frequent screening and Pap tests.   - The above recommendations are the same for women who have or have not gotten the vaccine for human papillomavirus (HPV).   - If you had a hysterectomy for a problem that was not cancer or a condition that could lead to cancer, then you no longer need Pap tests. Even if you no longer need a Pap test, a regular exam is a good idea to make sure no other problems are starting.   - If you are between ages 45 and 82 years, and you have had normal Pap tests going back 10 years, you no longer need Pap tests. Even if you no longer need a Pap test, a regular exam is a good idea to make sure no other problems are starting.   - If you have had past treatment for cervical cancer or a condition that could lead to cancer, you need Pap tests and screening for cancer for at least 20  years after your treatment.   - If Pap tests have been discontinued, risk factors (such as a new sexual partner) need to be reassessed to determine if screening should be resumed.   - The HPV test is an additional test that may be used for cervical cancer screening. The HPV test looks for the virus that can cause the cell changes on the cervix. The cells collected during the Pap test can be tested for HPV. The HPV test could be used to screen women aged 66 years and older, and should be used in women of any age who have unclear Pap test results. After the age of 21, women should have HPV testing at the same frequency as a Pap test.   Colorectal cancer can be detected and often prevented. Most routine colorectal cancer screening begins at the age of 43 years and continues through age 63 years. However, your health care provider may recommend screening at an earlier age if you have risk factors for colon cancer. On a yearly basis, your health care provider may provide home test kits to check for hidden blood in the stool.  Use of a small camera at the end of a tube, to directly examine the colon (sigmoidoscopy or colonoscopy), can detect the earliest forms of colorectal cancer. Talk to your health care provider about this at age 72, when routine screening begins. Direct exam of the colon should be repeated every 5 -10 years through age 59 years, unless early forms of pre-cancerous polyps or small growths are found.   People who are at an increased risk for hepatitis B should be screened for this virus. You are considered at high risk for hepatitis B if:  -You were born in a country where hepatitis B occurs often. Talk with your health care provider about which countries are considered high risk.  - Your parents were born in a high-risk country and you have not received a shot to protect against hepatitis B (hepatitis B vaccine).  - You have HIV or AIDS.  - You use needles to inject street drugs.  - You  live with, or have sex with, someone who has Hepatitis B.  - You get hemodialysis  treatment.  - You take certain medicines for conditions like cancer, organ transplantation, and autoimmune conditions.   Hepatitis C blood testing is recommended for all people born from [birth year] through 1965 and any individual with known risks for hepatitis C.   Practice safe sex. Use condoms and avoid high-risk sexual practices to reduce the spread of sexually transmitted infections (STIs). STIs include gonorrhea, chlamydia, syphilis, trichomonas, herpes, HPV, and human immunodeficiency virus (HIV). Herpes, HIV, and HPV are viral illnesses that have no cure. They can result in disability, cancer, and death. Sexually active women aged 76 years and younger should be checked for chlamydia. Older women with new or multiple partners should also be tested for chlamydia. Testing for other STIs is recommended if you are sexually active and at increased risk.   Osteoporosis is a disease in which the bones lose minerals and strength with aging. This can result in serious bone fractures or breaks. The risk of osteoporosis can be identified using a bone density scan. Women ages 1 years and over and women at risk for fractures or osteoporosis should discuss screening with their health care providers. Ask your health care provider whether you should take a calcium supplement or vitamin D to reduce the rate of osteoporosis.   Menopause can be associated with physical symptoms and risks. Hormone replacement therapy is available to decrease symptoms and risks. You should talk to your health care provider about whether hormone replacement therapy is right for you.   Use sunscreen. Apply sunscreen liberally and repeatedly throughout the day. You should seek shade when your shadow is shorter than you. Protect yourself by wearing long sleeves, pants, a wide-brimmed hat, and sunglasses year round, whenever you are outdoors.   Once a  month, do a whole body skin exam, using a mirror to look at the skin on your back. Tell your health care provider of new moles, moles that have irregular borders, moles that are larger than a pencil eraser, or moles that have changed in shape or color.   Stay current with required vaccines (immunizations).   Influenza vaccine. All adults should be immunized every year.   Tetanus, diphtheria, and acellular pertussis (Td, Tdap) vaccine. Pregnant women should receive 1 dose of Tdap vaccine during each pregnancy. The dose should be obtained regardless of the length of time since the last dose. Immunization is preferred during the 27th 36th week of gestation. An adult who has not previously received Tdap or who does not know her vaccine status should receive 1 dose of Tdap. This initial dose should be followed by tetanus and diphtheria toxoids (Td) booster doses every 10 years. Adults with an unknown or incomplete history of completing a 3-dose immunization series with Td-containing vaccines should begin or complete a primary immunization series including a Tdap dose. Adults should receive a Td booster every 10 years.   Varicella vaccine. An adult without evidence of immunity to varicella should receive 2 doses or a second dose if she has previously received 1 dose. Pregnant females who do not have evidence of immunity should receive the first dose after pregnancy. This first dose should be obtained before leaving the health care facility. The second dose should be obtained 4 8 weeks after the first dose.   Human papillomavirus (HPV) vaccine. Females aged 55 26 years who have not received the vaccine previously should obtain the 3-dose series. The vaccine is not recommended for use in pregnant females. However, pregnancy testing is not needed before receiving a dose.  If a female is found to be pregnant after receiving a dose, no treatment is needed. In that case, the remaining doses should be delayed until  after the pregnancy. Immunization is recommended for any person with an immunocompromised condition through the age of 41 years if she did not get any or all doses earlier. During the 3-dose series, the second dose should be obtained 4 8 weeks after the first dose. The third dose should be obtained 24 weeks after the first dose and 16 weeks after the second dose.   Zoster vaccine. One dose is recommended for adults aged 2 years or older unless certain conditions are present.   Measles, mumps, and rubella (MMR) vaccine. Adults born before [birth year] generally are considered immune to measles and mumps. Adults born in [birth year] or later should have 1 or more doses of MMR vaccine unless there is a contraindication to the vaccine or there is laboratory evidence of immunity to each of the three diseases. A routine second dose of MMR vaccine should be obtained at least 28 days after the first dose for students attending postsecondary schools, health care workers, or international travelers. People who received inactivated measles vaccine or an unknown type of measles vaccine during 1963 1967 should receive 2 doses of MMR vaccine. People who received inactivated mumps vaccine or an unknown type of mumps vaccine before 1979 and are at high risk for mumps infection should consider immunization with 2 doses of MMR vaccine. For females of childbearing age, rubella immunity should be determined. If there is no evidence of immunity, females who are not pregnant should be vaccinated. If there is no evidence of immunity, females who are pregnant should delay immunization until after pregnancy. Unvaccinated health care workers born before [birth year] who lack laboratory evidence of measles, mumps, or rubella immunity or laboratory confirmation of disease should consider measles and mumps immunization with 2 doses of MMR vaccine or rubella immunization with 1 dose of MMR vaccine.   Pneumococcal 13-valent conjugate (PCV13) vaccine. When  indicated, a person who is uncertain of her immunization history and has no record of immunization should receive the PCV13 vaccine. An adult aged 21 years or older who has certain medical conditions and has not been previously immunized should receive 1 dose of PCV13 vaccine. This PCV13 should be followed with a dose of pneumococcal polysaccharide (PPSV23) vaccine. The PPSV23 vaccine dose should be obtained at least 8 weeks after the dose of PCV13 vaccine. An adult aged 42 years or older who has certain medical conditions and previously received 1 or more doses of PPSV23 vaccine should receive 1 dose of PCV13. The PCV13 vaccine dose should be obtained 1 or more years after the last PPSV23 vaccine dose.   Pneumococcal polysaccharide (PPSV23) vaccine. When PCV13 is also indicated, PCV13 should be obtained first. All adults aged 30 years and older should be immunized. An adult younger than age 3 years who has certain medical conditions should be immunized. Any person who resides in a nursing home or long-term care facility should be immunized. An adult smoker should be immunized. People with an immunocompromised condition and certain other conditions should receive both PCV13 and PPSV23 vaccines. People with human immunodeficiency virus (HIV) infection should be immunized as soon as possible after diagnosis. Immunization during chemotherapy or radiation therapy should be avoided. Routine use of PPSV23 vaccine is not recommended for American Indians, [place] Natives, or people younger than 65 years unless there are medical conditions that require PPSV23 vaccine. When indicated,  people who have unknown immunization and have no record of immunization should receive PPSV23 vaccine. One-time revaccination 5 years after the first dose of PPSV23 is recommended for people aged 8 64 years who have chronic kidney failure, nephrotic syndrome, asplenia, or immunocompromised conditions. People who received 1 2 doses of PPSV23  before age 65 years should receive another dose of PPSV23 vaccine at age 34 years or later if at least 5 years have passed since the previous dose. Doses of PPSV23 are not needed for people immunized with PPSV23 at or after age 65 years.   Meningococcal vaccine. Adults with asplenia or persistent complement component deficiencies should receive 2 doses of quadrivalent meningococcal conjugate (MenACWY-D) vaccine. The doses should be obtained at least 2 months apart. Microbiologists working with certain meningococcal bacteria, [place] recruits, people at risk during an outbreak, and people who travel to or live in countries with a high rate of meningitis should be immunized. A first-year college student up through age 19 years who is living in a residence hall should receive a dose if she did not receive a dose on or after her 16th birthday. Adults who have certain high-risk conditions should receive one or more doses of vaccine.   Hepatitis A vaccine. Adults who wish to be protected from this disease, have certain high-risk conditions, work with hepatitis A-infected animals, work in hepatitis A research labs, or travel to or work in countries with a high rate of hepatitis A should be immunized. Adults who were previously unvaccinated and who anticipate close contact with an international adoptee during the first 60 days after arrival in the [place] from a country with a high rate of hepatitis A should be immunized.   Hepatitis B vaccine. Adults who wish to be protected from this disease, have certain high-risk conditions, may be exposed to blood or other infectious body fluids, are household contacts or sex partners of hepatitis B positive people, are clients or workers in certain care facilities, or travel to or work in countries with a high rate of hepatitis B should be immunized.   Haemophilus influenzae type b (Hib) vaccine. A previously unvaccinated person with asplenia or sickle cell disease  or having a scheduled splenectomy should receive 1 dose of Hib vaccine. Regardless of previous immunization, a recipient of a hematopoietic stem cell transplant should receive a 3-dose series 6 12 months after her successful transplant. Hib vaccine is not recommended for adults with HIV infection.  Preventive Services / Frequency Ages 55 to 39years  Blood pressure check.** / Every 1 to 2 years.  Lipid and cholesterol check.** / Every 5 years beginning at age 70.  Clinical breast exam.** / Every 3 years for women in their 47s and 30s.  BRCA-related cancer risk assessment.** / For women who have family members with a BRCA-related cancer (breast, ovarian, tubal, or peritoneal cancers).  Pap test.** / Every 2 years from ages 71 through 68. Every 3 years starting at age 59 through age 75 or 72 with a history of 3 consecutive normal Pap tests.  HPV screening.** / Every 3 years from ages 29 through ages 58 to 59 with a history of 3 consecutive normal Pap tests.  Hepatitis C blood test.** / For any individual with known risks for hepatitis C.  Skin self-exam. / Monthly.  Influenza vaccine. / Every year.  Tetanus, diphtheria, and acellular pertussis (Tdap, Td) vaccine.** / Consult your health care provider. Pregnant women should receive 1 dose of Tdap vaccine during each  pregnancy. 1 dose of Td every 10 years.  Varicella vaccine.** / Consult your health care provider. Pregnant females who do not have evidence of immunity should receive the first dose after pregnancy.  HPV vaccine. / 3 doses over 6 months, if 91 and younger. The vaccine is not recommended for use in pregnant females. However, pregnancy testing is not needed before receiving a dose.  Measles, mumps, rubella (MMR) vaccine.** / You need at least 1 dose of MMR if you were born in 1957 or later. You may also need a 2nd dose. For females of childbearing age, rubella immunity should be determined. If there is no evidence of immunity,  females who are not pregnant should be vaccinated. If there is no evidence of immunity, females who are pregnant should delay immunization until after pregnancy.  Pneumococcal 13-valent conjugate (PCV13) vaccine.** / Consult your health care provider.  Pneumococcal polysaccharide (PPSV23) vaccine.** / 1 to 2 doses if you smoke cigarettes or if you have certain conditions.  Meningococcal vaccine.** / 1 dose if you are age 41 to 1 years and a Market researcher living in a residence hall, or have one of several medical conditions, you need to get vaccinated against meningococcal disease. You may also need additional booster doses.  Hepatitis A vaccine.** / Consult your health care provider.  Hepatitis B vaccine.** / Consult your health care provider.  Haemophilus influenzae type b (Hib) vaccine.** / Consult your health care provider.  Ages 36 to 64years  Blood pressure check.** / Every 1 to 2 years.  Lipid and cholesterol check.** / Every 5 years beginning at age 87 years.  Lung cancer screening. / Every year if you are aged 74 80 years and have a 30-pack-year history of smoking and currently smoke or have quit within the past 15 years. Yearly screening is stopped once you have quit smoking for at least 15 years or develop a health problem that would prevent you from having lung cancer treatment.  Clinical breast exam.** / Every year after age 32 years.  BRCA-related cancer risk assessment.** / For women who have family members with a BRCA-related cancer (breast, ovarian, tubal, or peritoneal cancers).  Mammogram.** / Every year beginning at age 29 years and continuing for as long as you are in good health. Consult with your health care provider.  Pap test.** / Every 3 years starting at age 50 years through age 43 or 79 years with a history of 3 consecutive normal Pap tests.  HPV screening.** / Every 3 years from ages 75 years through ages 53 to 35 years with a history of 3  consecutive normal Pap tests.  Fecal occult blood test (FOBT) of stool. / Every year beginning at age 34 years and continuing until age 75 years. You may not need to do this test if you get a colonoscopy every 10 years.  Flexible sigmoidoscopy or colonoscopy.** / Every 5 years for a flexible sigmoidoscopy or every 10 years for a colonoscopy beginning at age 33 years and continuing until age 73 years.  Hepatitis C blood test.** / For all people born from [birth year] through 1967 and any individual with known risks for hepatitis C.  Skin self-exam. / Monthly.  Influenza vaccine. / Every year.  Tetanus, diphtheria, and acellular pertussis (Tdap/Td) vaccine.** / Consult your health care provider. Pregnant women should receive 1 dose of Tdap vaccine during each pregnancy. 1 dose of Td every 10 years.  Varicella vaccine.** / Consult your health care provider. Pregnant females  who do not have evidence of immunity should receive the first dose after pregnancy.  Zoster vaccine.** / 1 dose for adults aged 71 years or older.  Measles, mumps, rubella (MMR) vaccine.** / You need at least 1 dose of MMR if you were born in 1957 or later. You may also need a 2nd dose. For females of childbearing age, rubella immunity should be determined. If there is no evidence of immunity, females who are not pregnant should be vaccinated. If there is no evidence of immunity, females who are pregnant should delay immunization until after pregnancy.  Pneumococcal 13-valent conjugate (PCV13) vaccine.** / Consult your health care provider.  Pneumococcal polysaccharide (PPSV23) vaccine.** / 1 to 2 doses if you smoke cigarettes or if you have certain conditions.  Meningococcal vaccine.** / Consult your health care provider.  Hepatitis A vaccine.** / Consult your health care provider.  Hepatitis B vaccine.** / Consult your health care provider.  Haemophilus influenzae type b (Hib) vaccine.** / Consult your health care  provider.  Ages 24 years and over  Blood pressure check.** / Every 1 to 2 years.  Lipid and cholesterol check.** / Every 5 years beginning at age 46 years.  Lung cancer screening. / Every year if you are aged 42 80 years and have a 30-pack-year history of smoking and currently smoke or have quit within the past 15 years. Yearly screening is stopped once you have quit smoking for at least 15 years or develop a health problem that would prevent you from having lung cancer treatment.  Clinical breast exam.** / Every year after age 53 years.  BRCA-related cancer risk assessment.** / For women who have family members with a BRCA-related cancer (breast, ovarian, tubal, or peritoneal cancers).  Mammogram.** / Every year beginning at age 78 years and continuing for as long as you are in good health. Consult with your health care provider.  Pap test.** / Every 3 years starting at age 87 years through age 28 or 21 years with 3 consecutive normal Pap tests. Testing can be stopped between 65 and 70 years with 3 consecutive normal Pap tests and no abnormal Pap or HPV tests in the past 10 years.  HPV screening.** / Every 3 years from ages 61 years through ages 22 or 65 years with a history of 3 consecutive normal Pap tests. Testing can be stopped between 65 and 70 years with 3 consecutive normal Pap tests and no abnormal Pap or HPV tests in the past 10 years.  Fecal occult blood test (FOBT) of stool. / Every year beginning at age 63 years and continuing until age 60 years. You may not need to do this test if you get a colonoscopy every 10 years.  Flexible sigmoidoscopy or colonoscopy.** / Every 5 years for a flexible sigmoidoscopy or every 10 years for a colonoscopy beginning at age 61 years and continuing until age 23 years.  Hepatitis C blood test.** / For all people born from [birth year] through 1967 and any individual with known risks for hepatitis C.  Osteoporosis screening.** / A one-time screening for  women ages 47 years and over and women at risk for fractures or osteoporosis.  Skin self-exam. / Monthly.  Influenza vaccine. / Every year.  Tetanus, diphtheria, and acellular pertussis (Tdap/Td) vaccine.** / 1 dose of Td every 10 years.  Varicella vaccine.** / Consult your health care provider.  Zoster vaccine.** / 1 dose for adults aged 50 years or older.  Pneumococcal 13-valent conjugate (PCV13) vaccine.** / Consult  your health care provider.  Pneumococcal polysaccharide (PPSV23) vaccine.** / 1 dose for all adults aged 48 years and older.  Meningococcal vaccine.** / Consult your health care provider.  Hepatitis A vaccine.** / Consult your health care provider.  Hepatitis B vaccine.** / Consult your health care provider.  Haemophilus influenzae type b (Hib) vaccine.** / Consult your health care provider.     ** Family history and personal history of risk and conditions may change your health care provider's recommendations. Document Released: 09/07/2001 Document Revised: 05/02/2013  [facility] Patient Information 2014 [place], [place].  Top Ten Foods for Health  1. Water Drink at least 8 to 12 cups of water daily. Consume half of your body weight in pounds, is the amount of water in ounces to drink daily.  Ie: a 200lb person = 100 oz water daily  2. Dark Green Vegetables Eat dark green vegetables at least three to four times a week. Good options include broccoli, peppers, brussel sprouts and leafy greens like kale and spinach.  3. Whole Grains Whole grains should be included in your diet at least two to three times daily. Look for whole wheat flour, rye, oatmeal, barley, amaranth, quinoa or a multigrain. A good source of fiber includes 3 to 4 grams of fiber per serving. A great source has 5 or more grams of fiber per serving.  4. Beans and Lentils Try to eat a bean-based meal at least once a week. Try to add legumes, including beans and lentils, to soups, stews, casseroles,  salads and dips or eat them plain.  5. Fish Try to eat two to three serving of fish a week. A serving consists of 3 to 4 ounces of cooked fish. Good choices are salmon, trout, herring, bluefish, sardines and tuna.  6. Berries Include two to four servings of fruit in your diet each day. Try to eat berries such as raspberries, blueberries, blackberries and strawberries.  7. Winter Squash Eat butternut and acorn squash as well as other richly pigmented dark orange and green colored vegetables like sweet potato, cantaloupe and mango.  8. Soy 25 grams of soy protein a day is recommended as part of a low-fat diet to help lower cholesterol levels. Try tofu, soymilk, edamame soybeans, tempeh and texturized vegetable protein (TVP).  9. Flaxseed, Nuts and Seeds Add 1 to 2 tablespoons of ground flaxseed or other seeds to food each day or include a moderate amount of nuts - 1/4 cup - in your daily diet.  10. Organic Yogurt Men and women between 8 and 51 years of age need 1000 milligrams of calcium a day and 1200 milligrams if 50 or older. Eat calcium-rich foods such as nonfat or low-fat dairy products three to four times a day. Include organic choices.    EXERCISE AND DIET:  We recommended that you start or continue a regular exercise program for good health. Regular exercise means any activity that makes your heart beat faster and makes you sweat.  We recommend exercising at least 30 minutes per day at least 3 days a week, preferably 5.  We also recommend a diet low in fat and sugar / carbohydrates.  Inactivity, poor dietary choices and obesity can cause diabetes, heart attack, stroke, and kidney damage, among others.     ALCOHOL AND SMOKING:  Women should limit their alcohol intake to no more than 7 drinks/beers/glasses of wine (combined, not each!) per week. Moderation of alcohol intake to this level decreases your risk of breast cancer and liver  damage.  ( And of course, no recreational drugs are  part of a healthy lifestyle.)  Also, you should not be smoking at all or even being exposed to second hand smoke. Most people know smoking can cause cancer, and various heart and lung diseases, but did you know it also contributes to weakening of your bones?  Aging of your skin?  Yellowing of your teeth and nails?   CALCIUM AND VITAMIN D:  Adequate intake of calcium and Vitamin D are recommended.  The recommendations for exact amounts of these supplements seem to change often, but generally speaking 600 mg of calcium (either carbonate or citrate) and 800 units of Vitamin D per day seems prudent. Certain women may benefit from higher intake of Vitamin D.  If you are among these women, your doctor will have told you during your visit.     PAP SMEARS:  Pap smears, to check for cervical cancer or precancers,  have traditionally been done yearly, although recent scientific advances have shown that most women can have pap smears less often.  However, every woman still should have a physical exam from her gynecologist or primary care physician every year. It will include a breast check, inspection of the vulva and vagina to check for abnormal growths or skin changes, a visual exam of the cervix, and then an exam to evaluate the size and shape of the uterus and ovaries.  And after 72 years of age, a rectal exam is indicated to check for rectal cancers. We will also provide age appropriate advice regarding health maintenance, like when you should have certain vaccines, screening for sexually transmitted diseases, bone density testing, colonoscopy, mammograms, etc.    MAMMOGRAMS:  All women over 40 years old should have a yearly mammogram. Many facilities now offer a "3D" mammogram, which may cost around $50 extra out of pocket. If possible,  we recommend you accept the option to have the 3D mammogram performed.  It both reduces the number of women who will be called back for extra views which then turn out to be  normal, and it is better than the routine mammogram at detecting truly abnormal areas.     COLONOSCOPY:  Colonoscopy to screen for colon cancer is recommended for all women at age 48.  We know, you hate the idea of the prep.  We agree, BUT, having colon cancer and not knowing it is worse!!  Colon cancer so often starts as a polyp that can be seen and removed at colonscopy, which can quite literally save your life!  And if your first colonoscopy is normal and you have no family history of colon cancer, most women don't have to have it again for 10 years.  Once every ten years, you can do something that may end up saving your life, right?  We will be happy to help you get it scheduled when you are ready.  Be sure to check your insurance coverage so you understand how much it will cost.  It may be covered as a preventative service at no cost, but you should check your particular policy.

## 2016-04-20 NOTE — Progress Notes (Signed)
Impression and Recommendations:    1. Encounter for wellness examination   2. Need for prophylactic vaccination against Streptococcus pneumoniae (pneumococcus)   3. Need for hepatitis C screening test   4. Environmental and seasonal allergies     Please see orders section below for further details of actions taken during this office visit.  WIll update PNA vaccine, TDap, Hep C,   Flu near future when we get the "senior doses" in, She will call at the end of the week as we told her they should be and by Friday   thinks she had Zostavx around 2005 At [facility]  UTD colonscopy- yrly stool cards  Pap- June '17  Skin screening- yrly done w Derm  Bone density- UTD- 6/17  Gross side effects, risk and benefits, and alternatives of medications discussed with patient.  Patient is aware that all medications have potential side effects and we are unable to predict every side effect or drug-drug interaction that may occur.  Expresses verbal understanding and consents to current therapy plan and treatment regiment.  1) Anticipatory Guidance: Discussed importance of wearing a seatbelt while driving, not texting while driving; sunscreen when outside along with yearly skin surveillance; eating a well balanced and modest diet; physical activity at least 25 minutes per day or 150 min/ week of moderate to intense activity.  2) Immunizations / Screenings / Labs:  All immunizations and screenings that patient agrees to, are up-to-date per recommendations or will be updated today.  Patient understands the needs for q 11mo dental and yearly vision screens which pt will schedule independently.   3) Weight:    Improve nutrient density of diet through increasing intake of fruits and vegetables and decreasing saturated/trans fats, white flour products and refined sugar products.   F-up preventative CPE in 1 year. F/up sooner for chronic care management as discussed and/or prn.  Please see orders placed and  AVS handed out to patient at the end of our visit for further patient instructions/ counseling done pertaining to today's office visit.  Orders Placed This Encounter  Procedures  . Pneumococcal conjugate vaccine 13-valent  . Tdap vaccine greater than or equal to 7yo IM  . Hepatitis C Antibody    Subjective:    Chief Complaint  Patient presents with  . Annual Exam   CC: CPE  HPI: Leslie Dixon is a 70 y.o. female who presents to [facility] today a yearly health maintenance exam.  Health Maintenance Summary Reviewed and updated, unless pt declines services.  Alcohol:        No concerns, no excessive use Exercise Habits:     wts and walking at least 51min daily STD concerns:     none Drug Use:     None Birth control method:    n/a Menses regular:      n/a Lumps or breast concerns:    Aug 1st Mammo Bone density- June '17 Breast Cancer Family History:     Aunt- died 72 breast CA  WIll update PNA vaccine, TDap, Hep C, Flu near future, thinks she had Zostavx around 2005 UTD colonscopy- yrly stool cards Pap- June '17 Skin screening- yrly    Wt Readings from Last 3 Encounters:  04/20/16 140 lb 9.6 oz (63.8 kg)  03/05/16 136 lb 12.8 oz (62.1 kg)  02/12/16 137 lb (62.1 kg)   BP Readings from Last 3 Encounters:  04/20/16 115/65  03/05/16 100/64  02/12/16 107/60   Pulse  Readings from Last 3 Encounters:  04/20/16 84  03/05/16 67  02/12/16 70     Past Medical History:  Diagnosis Date  . Blood dyscrasia    LEUKOPENIA- DR. CHISM - NO TREATMENT BUT IS MONITORED  . Chronic back pain   . Chronic back pain   . Fibromyalgia   . Foot drop, right    RELATED TO LUMBAR PROBLEMS  . GERD (gastroesophageal reflux disease)    PRILOSEC PRN  . History of kidney stones   . Horseshoe kidney    BILATERAL  . Leukopenia   . Migraine    OCCULAR MIGRAINES - AURA WITH EYES  . Osteoporosis   . Pain    LUMBAR PAIN - RECENT FALL BECAUSE LEGS GAVE OUT - PT  HAS HAD LUMBAR INJECTION SINCE THE FALL THAT HAS HELPED BACK PAIN  . Pain    CHRONIC RIGHT UPPER QUADRANT PAIN - RELATED TO GALLBLADDER PROBLEM  . Sciatica of right side   . Sinusitis    f/u with Dr. Janace Hoard.     Past Surgical History:  Procedure Laterality Date  . ABDOMINAL HYSTERECTOMY  1983  . APPENDECTOMY  1968  . BILATERAL SALPINGOOPHORECTOMY  2000  . CHOLECYSTECTOMY N/A 04/12/2014   Procedure: LAPAROSCOPIC CHOLECYSTECTOMY WITH INTRAOPERATIVE CHOLANGIOGRAM;  Surgeon: Kaylyn Lim, MD;  Location: [facility];  Service: General;  Laterality: N/A;  . Lumbar/cervical surgeries     CERVICAL FUSION C7-C6 WITH BONE GRAFT; C6-C5 WITH PLATE AND 4 SCREWS;  3 LUMBAR SURGERIES BUT NO FUSION  . NASAL SINUS SURGERY  2010  . right knee     ARTHROSCOPY  . WISDOM TOOTH PULLED        Family History  Problem Relation Age of Onset  . Cancer Father     Lung  . Neurodegenerative disease Mother   . Cancer Brother     prostate  . Cancer Maternal Aunt     breast cancer  . Cancer Maternal Grandfather     Leukemia  . Migraines Sister       History  Drug Use No  ,   History  Alcohol Use No    Comment: occassionally  ,   History  Smoking Status  . Never Smoker  Smokeless Tobacco  . Never Used  ,   History  Sexual Activity  . Sexual activity: Yes  . Birth control/ protection: None    Current Outpatient Prescriptions on File Prior to Visit  Medication Sig Dispense Refill  . carboxymethylcellulose (REFRESH PLUS) 0.5 % SOLN Place 1-2 drops into both eyes daily as needed (dry eyes.).    Marland Kitchen Cholecalciferol (VITAMIN D3) 5000 units TABS 5,000 IU OTC vitamin D3 daily. 90 tablet 3  . ibandronate (BONIVA) 150 MG tablet Take 1 tablet (150 mg total) by mouth every 30 (thirty) days. 12 tablet 0  . loratadine (CLARITIN) 10 MG tablet Take by mouth.    . meloxicam (MOBIC) 7.5 MG tablet Take 1 tablet (7.5 mg total) by mouth daily. 180 tablet 0  . Omeprazole (PRILOSEC PO) Take by mouth. ONE IF  NEEDED    . traMADol (ULTRAM) 50 MG tablet Take 1 tablet (50 mg total) by mouth every 6 (six) hours as needed for moderate pain. 20 tablet 0  . vitamin B-12 (CYANOCOBALAMIN) 1000 MCG tablet Take 1,000 mcg by mouth daily.    . vitamin C (ASCORBIC ACID) 500 MG tablet Take 500 mg by mouth daily.    . vitamin E 400 UNIT capsule Take 400 Units by  mouth daily.     No current facility-administered medications on file prior to visit.     Allergies: Peanut oil; Clarithromycin; Codeine; Lactose; Other; Penicillins; Pregabalin; and Sulfonamide derivatives  Review of Systems  Constitutional: Negative.  Negative for chills, diaphoresis, fever, malaise/fatigue and weight loss.  HENT: Positive for congestion and ear pain. Negative for sore throat and tinnitus.   Eyes: Negative.  Negative for blurred vision, double vision and photophobia.  Respiratory: Positive for cough. Negative for shortness of breath and wheezing.   Cardiovascular: Negative.  Negative for chest pain and palpitations.  Gastrointestinal: Negative.  Negative for blood in stool, diarrhea, nausea and vomiting.  Genitourinary: Negative.  Negative for dysuria, frequency and urgency.  Musculoskeletal: Negative.  Negative for joint pain and myalgias.  Skin: Negative.  Negative for itching and rash.  Neurological: Negative.  Negative for dizziness, focal weakness, weakness and headaches.  Endo/Heme/Allergies: Positive for environmental allergies. Negative for polydipsia. Does not bruise/bleed easily.  Psychiatric/Behavioral: Negative.  Negative for depression and memory loss. The patient is not nervous/anxious and does not have insomnia.      Objective:    Blood pressure 115/65, pulse 84, height 5\' 6"  (1.676 m), weight 140 lb 9.6 oz (63.8 kg). Body mass index is 22.69 kg/m. General Appearance:    Alert, cooperative, no distress, appears stated age  Head:    Normocephalic, without obvious abnormality, atraumatic  Eyes:    PERRL,  conjunctiva/corneas clear, EOM's intact, fundi    benign, both eyes  Ears:    Normal TM's and external ear canals, both ears  Nose:   Nares normal, septum midline, mucosa normal, no drainage    or sinus tenderness  Throat:   Lips w/o lesion, mucosa moist, and tongue normal; teeth and   gums normal  Neck:   Supple, symmetrical, trachea midline, no adenopathy;    thyroid:  no enlargement/tenderness/nodules; no carotid   bruit or JVD  Back:     Symmetric, no curvature, ROM normal, no CVA tenderness  Lungs:     Clear to auscultation bilaterally, respirations unlabored, no       Wh/ R/ R  Chest Wall:    No tenderness or gross deformity; normal excursion   Heart:    Regular rate and rhythm, S1 and S2 normal, no murmur, rub   or gallop  Breast Exam:    Deferred by pt  Abdomen:     Soft, non-tender, bowel sounds active all four quadrants, NO   G/R/R, no masses, no organomegaly  Genitalia:   deferred  Rectal:    Deferred by pt  Extremities:   Extremities normal, atraumatic, no cyanosis or gross edema  Pulses:   2+ and symmetric all extremities  Skin:   Warm, dry, Skin color, texture, turgor normal, no obvious rashes or lesions Psych: No HI/SI, judgement and insight good, Euthymic mood. Full Affect.  Neurologic:   CNII-XII intact, normal strength, sensation and reflexes    Throughout

## 2016-04-21 LAB — HEPATITIS C ANTIBODY: HCV Ab: NEGATIVE

## 2016-04-22 ENCOUNTER — Other Ambulatory Visit: Payer: Self-pay | Admitting: Physical Medicine and Rehabilitation

## 2016-04-22 DIAGNOSIS — M5030 Other cervical disc degeneration, unspecified cervical region: Secondary | ICD-10-CM | POA: Diagnosis not present

## 2016-04-22 DIAGNOSIS — M5412 Radiculopathy, cervical region: Secondary | ICD-10-CM | POA: Diagnosis not present

## 2016-04-27 ENCOUNTER — Ambulatory Visit (INDEPENDENT_AMBULATORY_CARE_PROVIDER_SITE_OTHER): Payer: 59 | Admitting: Family Medicine

## 2016-04-27 ENCOUNTER — Encounter: Payer: Self-pay | Admitting: Family Medicine

## 2016-04-27 VITALS — BP 104/63 | HR 71 | Ht 66.0 in | Wt 141.0 lb

## 2016-04-27 DIAGNOSIS — R635 Abnormal weight gain: Secondary | ICD-10-CM

## 2016-04-27 DIAGNOSIS — E049 Nontoxic goiter, unspecified: Secondary | ICD-10-CM | POA: Diagnosis not present

## 2016-04-27 DIAGNOSIS — R6889 Other general symptoms and signs: Secondary | ICD-10-CM | POA: Diagnosis not present

## 2016-04-27 DIAGNOSIS — Z8639 Personal history of other endocrine, nutritional and metabolic disease: Secondary | ICD-10-CM | POA: Diagnosis not present

## 2016-04-27 NOTE — Progress Notes (Signed)
Subjective:    HPI: Leslie Dixon is a 70 y.o. female who presents to [facility] today b/c her daughter noticed couple months ago- that L neck/ thyroid looks bigger now than prior. No difficulty swallowing or globus sensation. Symptoms include more cold than usual 3 months, fatigued more than usual, can't seem to lose wt- in fact she feels she is gaining weight and not sure why- eating very little.   RECENT APPLICABLE LABS:  Specimen collected 6\ 21\ 17:  TSH-   1.92 Free T4-  1.2 T3 uptake-  30 PTH-   40 Calcium-  9.0  -Of note, patient weighed 139 pounds at an office visit on 6\17\14.     Wt Readings from Last 3 Encounters:  04/27/16 141 lb (64 kg)  04/20/16 140 lb 9.6 oz (63.8 kg)  03/05/16 136 lb 12.8 oz (62.1 kg)   BP Readings from Last 3 Encounters:  04/27/16 104/63  04/20/16 115/65  03/05/16 100/64   Pulse Readings from Last 3 Encounters:  04/27/16 71  04/20/16 84  03/05/16 67   BMI Readings from Last 3 Encounters:  04/27/16 22.76 kg/m  04/20/16 22.69 kg/m  03/05/16 21.75 kg/m     Patient Active Problem List   Diagnosis Date Noted  . h/o GAD (had panic in past) 02/12/2016    Priority: High  . Osteoporosis     Priority: High  . Fibromyalgia 03/07/2007    Priority: High  . GERD (gastroesophageal reflux disease)     Priority: Medium  . Chronic back pain     Priority: Medium  . LACTOSE INTOLERANCE 03/07/2007    Priority: Medium  . OA (osteoarthritis) 03/07/2007    Priority: Medium  . Idiopathic benign Leukopenia     Priority: Low  . Intolerance to cold 05/02/2016  . Environmental and seasonal allergies 04/20/2016  . Encounter for wellness examination 04/20/2016  . Can't get food down 04/10/2012  . Difficulty speaking 04/10/2012  . Hydronephrosis 03/22/2012  . Back pain   . Chronic infection of sinus 07/09/2011  . Blood in the urine 05/06/2011  . 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 Calculus of kidney 05/06/2011  .  HYPOKALEMIA 01/03/2008  . ALLERGIC RHINITIS DUE TO POLLEN 12/13/2007  . 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 COMMON MIGRAINE 03/07/2007  . CARPAL TUNNEL SYNDROME 03/07/2007  . VARICOSE VEIN 03/07/2007  . NEPHROLITHIASIS, HX OF 03/07/2007    Past Surgical History:  Procedure Laterality Date  . ABDOMINAL HYSTERECTOMY  1983  . APPENDECTOMY  1968  . BILATERAL SALPINGOOPHORECTOMY  2000  . CHOLECYSTECTOMY N/A 04/12/2014   Procedure: LAPAROSCOPIC CHOLECYSTECTOMY WITH INTRAOPERATIVE CHOLANGIOGRAM;  Surgeon: Kaylyn Lim, MD;  Location: [facility];  Service: General;  Laterality: N/A;  . Lumbar/cervical surgeries     CERVICAL FUSION C7-C6 WITH BONE GRAFT; C6-C5 WITH PLATE AND 4 SCREWS;  3 LUMBAR SURGERIES BUT NO FUSION  . NASAL SINUS SURGERY  2010  . right knee     ARTHROSCOPY  . WISDOM TOOTH PULLED       History  Drug Use No  ,  History  Alcohol Use No    Comment: occassionally  ,  History  Smoking Status  . Never Smoker  Smokeless Tobacco  . Never Used  ,  History  Sexual Activity  . Sexual activity: Yes  . Birth control/ protection: None    Patient's Medications  New Prescriptions   No medications on file  Previous Medications   CARBOXYMETHYLCELLULOSE (  REFRESH PLUS) 0.5 % SOLN    Place 1-2 drops into both eyes daily as needed (dry eyes.).   CHOLECALCIFEROL (VITAMIN D3) 5000 UNITS TABS    5,000 IU OTC vitamin D3 daily.   IBANDRONATE (BONIVA) 150 MG TABLET    Take 1 tablet (150 mg total) by mouth every 30 (thirty) days.   LORATADINE (CLARITIN) 10 MG TABLET    Take by mouth.   MELOXICAM (MOBIC) 7.5 MG TABLET    Take 1 tablet (7.5 mg total) by mouth daily.   OMEPRAZOLE (PRILOSEC PO)    Take by mouth. ONE IF NEEDED   TRAMADOL (ULTRAM) 50 MG TABLET    Take 1 tablet (50 mg total) by mouth every 6 (six) hours as needed for moderate pain.   TRIAMCINOLONE (NASACORT ALLERGY 24HR) 55 MCG/ACT AERO NASAL INHALER    Place 1 spray into the nose daily.   VITAMIN B-12 (CYANOCOBALAMIN) 1000 MCG  TABLET    Take 1,000 mcg by mouth daily.   VITAMIN C (ASCORBIC ACID) 500 MG TABLET    Take 500 mg by mouth daily.   VITAMIN E 400 UNIT CAPSULE    Take 400 Units by mouth daily.  Modified Medications   No medications on file  Discontinued Medications   No medications on file    Peanut oil; Clarithromycin; Codeine; Lactose; Other; Penicillins; Pregabalin; and Sulfonamide derivatives  Current Meds  Medication Sig  . carboxymethylcellulose (REFRESH PLUS) 0.5 % SOLN Place 1-2 drops into both eyes daily as needed (dry eyes.).  Marland Kitchen Cholecalciferol (VITAMIN D3) 5000 units TABS 5,000 IU OTC vitamin D3 daily.  Marland Kitchen ibandronate (BONIVA) 150 MG tablet Take 1 tablet (150 mg total) by mouth every 30 (thirty) days.  Marland Kitchen loratadine (CLARITIN) 10 MG tablet Take by mouth.  . meloxicam (MOBIC) 7.5 MG tablet Take 1 tablet (7.5 mg total) by mouth daily.  . Omeprazole (PRILOSEC PO) Take by mouth. ONE IF NEEDED  . traMADol (ULTRAM) 50 MG tablet Take 1 tablet (50 mg total) by mouth every 6 (six) hours as needed for moderate pain.  Marland Kitchen triamcinolone (NASACORT ALLERGY 24HR) 55 MCG/ACT AERO nasal inhaler Place 1 spray into the nose daily.  . vitamin B-12 (CYANOCOBALAMIN) 1000 MCG tablet Take 1,000 mcg by mouth daily.  . vitamin C (ASCORBIC ACID) 500 MG tablet Take 500 mg by mouth daily.  . vitamin E 400 UNIT capsule Take 400 Units by mouth daily.    Review of Systems  Constitutional: Positive for chills and malaise/fatigue. Negative for diaphoresis, fever and weight loss.       Weight gain  HENT: Negative for congestion, sore throat and tinnitus.   Eyes: Negative.  Negative for blurred vision, double vision and photophobia.  Respiratory: Negative.  Negative for cough and wheezing.   Cardiovascular: Negative.  Negative for chest pain and palpitations.  Gastrointestinal: Negative.  Negative for blood in stool, diarrhea, nausea and vomiting.  Genitourinary: Negative.  Negative for dysuria, frequency and urgency.    Musculoskeletal: Positive for joint pain and myalgias.  Skin: Positive for itching. Negative for rash.       Dry skin  Neurological: Negative.  Negative for dizziness, focal weakness, weakness and headaches.  Endo/Heme/Allergies: Positive for environmental allergies. Negative for polydipsia. Does not bruise/bleed easily.       Swelling in the neck on left side, onset x 4 months  Psychiatric/Behavioral: Negative.  Negative for depression and memory loss. The patient is not nervous/anxious and does not have insomnia.  Objective:  Blood pressure 104/63, pulse 71, height 5\' 6"  (1.676 m), weight 141 lb (64 kg). Body mass index is 22.76 kg/m.  General: Well Developed, well nourished, and in no acute distress.  HEENT: Normocephalic, atraumatic, OP-clear ;  TMs\EACs- clr;   mild increased fullness in the region near left lobe of thyroid vs R.;  no lymphadenopathy otherwise appreciated;   no carotid bruits appreciated, no JVD. Skin: Warm and dry, cap RF less 2 sec, good turgor CV: Regular rate rhythm, S1, S2, no murmur Respiratory: CTA b/l; speaking in full sentences, no conversational dyspnea NeuroM-Sk: Ambulates w/o assistance, moves * 4 Psych: A and O *3; mood-anxious, affect- hyperexcitable, judgment and insight-okay    Impression and Recommendations:    1. ? Enlarged thyroid gland- L lobe   2. Intolerance to cold   3. Unintended weight gain    1) Health counseling performed. We will await ultrasound results and further plan of care after that is performed   Orders Placed This Encounter  Procedures  . US Soft Tissue Head/Neck    WT-141/NO NEEDS [facility] [phone] EPIC ORDER    Standing Status:   Future    Standing Expiration Date:   05/28/2016    Order Specific Question:   Reason for Exam (SYMPTOM  OR DIAGNOSIS REQUIRED)    Answer:   L side neck fullness, ? L lobar thyroid enlrgment    Order Specific Question:   Preferred imaging location?    Answer:    [id number]    Order Specific Question:   Call Results- Best Contact Number?    Answer:   dr Asheton Scheffler's office- primary care at [facility] Prescriptions   No medications on file    Modified Medications   No medications on file    Discontinued Medications   No medications on file   The patient was counseled, risk factors were discussed, anticipatory guidance given.  Gross side effects, risk and benefits, and alternatives of medications discussed with patient.  Patient is aware that all medications have potential side effects and we are unable to predict every side effect or drug-drug interaction that may occur.  Expresses verbal understanding and consents to current therapy plan and treatment regimen.  Return if symptoms worsen or fail to improve; otherwise as planned prior for chronic care and follow-up.  Please see AVS handed out to patient at the end of our visit for further patient instructions/ counseling done pertaining to today's office visit.      Note: This document was prepared using Dragon voice recognition software and may include unintentional dictation errors.

## 2016-05-02 ENCOUNTER — Ambulatory Visit
Admission: RE | Admit: 2016-05-02 | Discharge: 2016-05-02 | Disposition: A | Discharge status: Home / Self Care | Payer: 59 | Source: Ambulatory Visit | Attending: Physical Medicine and Rehabilitation | Admitting: Physical Medicine and Rehabilitation

## 2016-05-02 DIAGNOSIS — Z8639 Personal history of other endocrine, nutritional and metabolic disease: Secondary | ICD-10-CM | POA: Insufficient documentation

## 2016-05-02 DIAGNOSIS — R6889 Other general symptoms and signs: Secondary | ICD-10-CM | POA: Insufficient documentation

## 2016-05-02 DIAGNOSIS — M5412 Radiculopathy, cervical region: Secondary | ICD-10-CM

## 2016-05-02 DIAGNOSIS — M542 Cervicalgia: Secondary | ICD-10-CM | POA: Diagnosis not present

## 2016-05-05 ENCOUNTER — Ambulatory Visit
Admission: RE | Admit: 2016-05-05 | Discharge: 2016-05-05 | Disposition: A | Discharge status: Home / Self Care | Payer: 59 | Source: Ambulatory Visit | Attending: Family Medicine | Admitting: Family Medicine

## 2016-05-05 DIAGNOSIS — R6889 Other general symptoms and signs: Secondary | ICD-10-CM

## 2016-05-05 DIAGNOSIS — E049 Nontoxic goiter, unspecified: Secondary | ICD-10-CM

## 2016-05-05 DIAGNOSIS — R635 Abnormal weight gain: Secondary | ICD-10-CM

## 2016-05-05 DIAGNOSIS — E041 Nontoxic single thyroid nodule: Secondary | ICD-10-CM | POA: Diagnosis not present

## 2016-05-10 ENCOUNTER — Telehealth: Payer: Self-pay

## 2016-05-10 NOTE — Telephone Encounter (Signed)
Pt called requesting results of thyroid ultrasound.  Please review.  Charyl Bigger, CMA

## 2016-05-10 NOTE — Telephone Encounter (Signed)
Sent you the [place] results themselves and my interpretation of it also.  Thanks

## 2016-05-11 ENCOUNTER — Other Ambulatory Visit: Payer: Self-pay

## 2016-05-11 DIAGNOSIS — E041 Nontoxic single thyroid nodule: Secondary | ICD-10-CM

## 2016-05-12 DIAGNOSIS — M5412 Radiculopathy, cervical region: Secondary | ICD-10-CM | POA: Diagnosis not present

## 2016-05-12 DIAGNOSIS — M5030 Other cervical disc degeneration, unspecified cervical region: Secondary | ICD-10-CM | POA: Diagnosis not present

## 2016-05-13 ENCOUNTER — Ambulatory Visit (INDEPENDENT_AMBULATORY_CARE_PROVIDER_SITE_OTHER): Payer: 59

## 2016-05-13 VITALS — BP 102/63 | HR 73 | Temp 96.6°F

## 2016-05-13 DIAGNOSIS — Z23 Encounter for immunization: Secondary | ICD-10-CM

## 2016-05-27 ENCOUNTER — Encounter: Payer: Self-pay | Admitting: Endocrinology

## 2016-05-27 ENCOUNTER — Ambulatory Visit (INDEPENDENT_AMBULATORY_CARE_PROVIDER_SITE_OTHER): Payer: 59 | Admitting: Endocrinology

## 2016-05-27 DIAGNOSIS — E041 Nontoxic single thyroid nodule: Secondary | ICD-10-CM | POA: Diagnosis not present

## 2016-05-27 NOTE — Patient Instructions (Signed)
It is very unlikely that the thyroid is anything to worry about. No thyroid treatment is needed now. However, I would be happy to see you back in 1 year.

## 2016-05-27 NOTE — Progress Notes (Signed)
Subjective:    Patient ID: Leslie Dixon, female    DOB: 06-Jan-1946, 70 y.o.   MRN: [id number]  HPI In early 2017, a family member noted pt had a nodule at the left anterior neck.  No assoc pain.  she is unaware of ever having had thyroid problems in the past.  He has no h/o XRT to the neck.  She has slight weight gain.  Past Medical History:  Diagnosis Date  . Blood dyscrasia    LEUKOPENIA- DR. CHISM - NO TREATMENT BUT IS MONITORED  . Chronic back pain   . Chronic back pain   . Fibromyalgia   . Foot drop, right    RELATED TO LUMBAR PROBLEMS  . GERD (gastroesophageal reflux disease)    PRILOSEC PRN  . History of kidney stones   . Horseshoe kidney    BILATERAL  . Leukopenia   . Migraine    OCCULAR MIGRAINES - AURA WITH EYES  . Osteoporosis   . Pain    LUMBAR PAIN - RECENT FALL BECAUSE LEGS GAVE OUT - PT HAS HAD LUMBAR INJECTION SINCE THE FALL THAT HAS HELPED BACK PAIN  . Pain    CHRONIC RIGHT UPPER QUADRANT PAIN - RELATED TO GALLBLADDER PROBLEM  . Sciatica of right side   . Sinusitis    f/u with Dr. Janace Hoard.     Past Surgical History:  Procedure Laterality Date  . ABDOMINAL HYSTERECTOMY  1983  . APPENDECTOMY  1968  . BILATERAL SALPINGOOPHORECTOMY  2000  . CHOLECYSTECTOMY N/A 04/12/2014   Procedure: LAPAROSCOPIC CHOLECYSTECTOMY WITH INTRAOPERATIVE CHOLANGIOGRAM;  Surgeon: Kaylyn Lim, MD;  Location: [facility];  Service: General;  Laterality: N/A;  . Lumbar/cervical surgeries     CERVICAL FUSION C7-C6 WITH BONE GRAFT; C6-C5 WITH PLATE AND 4 SCREWS;  3 LUMBAR SURGERIES BUT NO FUSION  . NASAL SINUS SURGERY  2010  . right knee     ARTHROSCOPY  . WISDOM TOOTH PULLED      Social History   Social History  . Marital status: Married    Spouse name: N/A  . Number of children: 2  . Years of education: N/A   Occupational History  .      retired Press photographer   Social History Main Topics  . Smoking status: Never Smoker  . Smokeless tobacco: Never Used  . Alcohol use No       Comment: occassionally  . Drug use: No  . Sexual activity: Yes    Birth control/ protection: None   Other Topics Concern  . Not on file   Social History Narrative  . No narrative on file    Current Outpatient Prescriptions on File Prior to Visit  Medication Sig Dispense Refill  . carboxymethylcellulose (REFRESH PLUS) 0.5 % SOLN Place 1-2 drops into both eyes daily as needed (dry eyes.).    Marland Kitchen Cholecalciferol (VITAMIN D3) 5000 units TABS 5,000 IU OTC vitamin D3 daily. 90 tablet 3  . ibandronate (BONIVA) 150 MG tablet Take 1 tablet (150 mg total) by mouth every 30 (thirty) days. 12 tablet 0  . loratadine (CLARITIN) 10 MG tablet Take by mouth.    . meloxicam (MOBIC) 7.5 MG tablet Take 1 tablet (7.5 mg total) by mouth daily. 180 tablet 0  . Omeprazole (PRILOSEC PO) Take by mouth. ONE IF NEEDED    . traMADol (ULTRAM) 50 MG tablet Take 1 tablet (50 mg total) by mouth every 6 (six) hours as needed for moderate pain. 20 tablet 0  .  triamcinolone (NASACORT ALLERGY 24HR) 55 MCG/ACT AERO nasal inhaler Place 1 spray into the nose daily.    . vitamin B-12 (CYANOCOBALAMIN) 1000 MCG tablet Take 1,000 mcg by mouth daily.    . vitamin C (ASCORBIC ACID) 500 MG tablet Take 500 mg by mouth daily.    . vitamin E 400 UNIT capsule Take 400 Units by mouth daily.     No current facility-administered medications on file prior to visit.     Allergies  Allergen Reactions  . Peanut Oil Anaphylaxis  . Clarithromycin     REACTION: GI  . Codeine     REACTION: nausea and vomiting  . Lactose     Other reaction(s): GI Upset (intolerance)  . 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 Penicillins     REACTION: per allergy testing/Patient states she has taken Augmentin without complications.   . Pregabalin     REACTION: headache, several side effects  . Sulfonamide Derivatives     REACTION: Hives, wheezing    Family History  Problem Relation Age of Onset  . Cancer  Father     Lung  . Neurodegenerative disease Mother   . Cancer Brother     prostate  . Cancer Maternal Aunt     breast cancer  . Cancer Maternal Grandfather     Leukemia  . Migraines Sister   . Thyroid disease Daughter     BP 104/62   Pulse 76   Ht 5\' 6"  (1.676 m)   Wt 142 lb (64.4 kg)   SpO2 98%   BMI 22.92 kg/m   Review of Systems Denies hoarseness, visual loss, chest pain, sob, dysphagia, skin rash, and memory loss.  She has fatigue, cold intolerance, headache, rhinorrhea, and easy bruising.  She has slight numbness of the right foot is due to L-spine probs.      Objective:   Physical Exam VS: see vs page GEN: no distress HEAD: head: no deformity eyes: no periorbital swelling, no proptosis external nose and ears are normal mouth: no lesion seen NECK: a healed scar is present.  I do not appreciate a nodule in the thyroid or elsewhere in the neck.  CHEST WALL: no deformity LUNGS: clear to auscultation CV: reg rate and rhythm, no murmur ABD: abdomen is soft, nontender.  no hepatosplenomegaly.  not distended.  no hernia MUSCULOSKELETAL: muscle bulk and strength are grossly normal.  no obvious joint swelling.  gait is normal and steady EXTEMITIES: no deformity.  no edema PULSES: no carotid bruit NEURO:  cn 2-12 grossly intact.   readily moves all 4's.  sensation is intact to touch on all 4's.   SKIN:  Normal texture and temperature.  No rash or suspicious lesion is visible.   NODES:  None palpable at the neck.   PSYCH: alert, well-oriented.  Does not appear anxious nor depressed.    Lab Results  Component Value Date   TSH 1.92 01/10/2016   T4TOTAL 7.7 01/14/2016   [place]: Solitary right thyroid nodule measuring 0.8 cm. This is a spongiform nodule and does not meet criteria for biopsy or dedicated follow-up.   I have reviewed outside records, and summarized:  Pt was seen for routine care.  Main symptoms were fatigue and weight gain.      Assessment & Plan:  Thyroid  nodule, new.  This is an incidental finding, as it is on the other side from pt's report of what dtr found.  We discussed low risk, but pt requests f/u.    Patient is advised the following: Patient Instructions  It is very unlikely that the thyroid is anything to worry about. No thyroid treatment is needed now. However, I would be happy to see you back in 1 year.

## 2016-05-28 DIAGNOSIS — E041 Nontoxic single thyroid nodule: Secondary | ICD-10-CM | POA: Insufficient documentation

## 2016-06-10 DIAGNOSIS — Z961 Presence of intraocular lens: Secondary | ICD-10-CM | POA: Diagnosis not present

## 2016-06-10 DIAGNOSIS — H5201 Hypermetropia, right eye: Secondary | ICD-10-CM | POA: Diagnosis not present

## 2016-06-10 DIAGNOSIS — H2511 Age-related nuclear cataract, right eye: Secondary | ICD-10-CM | POA: Diagnosis not present

## 2016-06-10 DIAGNOSIS — H20022 Recurrent acute iridocyclitis, left eye: Secondary | ICD-10-CM | POA: Diagnosis not present

## 2016-06-21 DIAGNOSIS — J310 Chronic rhinitis: Secondary | ICD-10-CM | POA: Diagnosis not present

## 2016-07-29 ENCOUNTER — Ambulatory Visit (INDEPENDENT_AMBULATORY_CARE_PROVIDER_SITE_OTHER): Payer: 59 | Admitting: Family Medicine

## 2016-07-29 VITALS — BP 96/55 | HR 76 | Temp 98.0°F | Ht 66.0 in | Wt 144.7 lb

## 2016-07-29 DIAGNOSIS — R059 Cough, unspecified: Secondary | ICD-10-CM

## 2016-07-29 DIAGNOSIS — G4700 Insomnia, unspecified: Secondary | ICD-10-CM | POA: Diagnosis not present

## 2016-07-29 DIAGNOSIS — H6691 Otitis media, unspecified, right ear: Secondary | ICD-10-CM

## 2016-07-29 DIAGNOSIS — G8929 Other chronic pain: Secondary | ICD-10-CM

## 2016-07-29 DIAGNOSIS — R05 Cough: Secondary | ICD-10-CM | POA: Diagnosis not present

## 2016-07-29 DIAGNOSIS — M549 Dorsalgia, unspecified: Secondary | ICD-10-CM | POA: Diagnosis not present

## 2016-07-29 MED ORDER — BENZONATATE 100 MG PO CAPS: 100.0000 mg | ORAL_CAPSULE | Freq: Three times a day (TID) | ORAL | 0 refills | Status: DC | PRN

## 2016-07-29 MED ORDER — AMOXICILLIN 875 MG PO TABS: 875.0000 mg | ORAL_TABLET | Freq: Two times a day (BID) | ORAL | 0 refills | Status: DC

## 2016-07-29 MED ORDER — AMITRIPTYLINE HCL 25 MG PO TABS: 25.0000 mg | ORAL_TABLET | Freq: Every day | ORAL | 3 refills | Status: DC

## 2016-07-29 NOTE — Progress Notes (Signed)
Acute Care Office visit  Assessment and plan:  1. Right otitis media, unspecified otitis media type   2. Insomnia, unspecified type   3. Chronic back pain, unspecified back location, unspecified back pain laterality   4. Cough      Anticipatory guidance and routine counseling done re: condition, txmnt options and need for follow up. All questions of patient's were answered.  - Viral vs Allergic vs Bacterial causes for pt's symptoms reveiwed.    - Supportive care and various OTC medications discussed in addition to any prescribed.  See med list. - Call or RTC if new symptoms, or if no improvement or worse over next couple days.   - Will consider ABX at that time if sx continue past 10 days and worsening.    Gross side effects, risk and benefits, and alternatives of medications discussed with patient.  Patient is aware that all medications have potential side effects and we are unable to predict every sideeffect or drug-drug interaction that may occur.  Expresses verbal understanding and consents to current therapy plan and treatment regiment.  Return if symptoms worsen or fail to improve.  Please see AVS handed out to patient at the end of our visit for additional patient instructions/ counseling done pertaining to today's office visit.  Note: This document was prepared using Dragon voice recognition software and may include unintentional dictation errors.    Subjective:    Chief Complaint  Patient presents with  . Sinusitis    HPI:  Pt presents with URI sx for 5-6 wks.---> bad cough and took tesselon perles and sudafed and NS in nose.     Then past  2 wks--> worsening ear pain. Worst last ngiht. R>>> L.  Water in ears as well worse on R.  No F/C many wks.        Now- head congestion-  No cough now couple wks.   Denies objective F/C, No N/V/D,  No SOB/DIB,  No Rash.   Overall getting better- but she is not well. .     Patient Active Problem List   Diagnosis Date  Noted  . Chronic vertigo 09/21/2016    Priority: High  . h/o GAD (had panic in past) 02/12/2016    Priority: High  . Fibromyalgia 03/07/2007    Priority: High  . GERD (gastroesophageal reflux disease)     Priority: Medium  . Osteoporosis     Priority: Medium  . Chronic back pain     Priority: Medium  . LACTOSE INTOLERANCE 03/07/2007    Priority: Medium  . OA (osteoarthritis) 03/07/2007    Priority: Medium  . ETD (Eustachian tube dysfunction), bilateral  ( R >er L ) 11/19/2016    Priority: Low  . 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 Insomnia 07/29/2016    Priority: Low  . History of non anemic vitamin B12 deficiency 05/02/2016    Priority: Low  . Environmental and seasonal allergies 04/20/2016    Priority: Low  . Idiopathic benign Leukopenia     Priority: Low  . Ocular migraine 03/07/2007    Priority: Low  . Right thyroid nodule 05/28/2016  . Intolerance to cold 05/02/2016  . Encounter for wellness examination 04/20/2016  . Can't get food down 04/10/2012  . Difficulty speaking 04/10/2012  . Hydronephrosis 03/22/2012  . Back pain   . Chronic infection of sinus 07/09/2011  . Blood in the urine 05/06/2011  .  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 Calculus of kidney 05/06/2011  . HYPOKALEMIA 01/03/2008  . Allergic rhinitis due to pollen 12/13/2007  . 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 CARPAL TUNNEL SYNDROME 03/07/2007  . VARICOSE VEIN 03/07/2007  . NEPHROLITHIASIS, HX OF 03/07/2007    Past medical history, Surgical history, Family history reviewed and noted below, Social history, Allergies, and Medications have been entered into the medical record, reviewed and changed as needed.   Allergies  Allergen Reactions  . Peanut Oil Anaphylaxis  . Clarithromycin     REACTION: GI  . Codeine     REACTION: nausea and vomiting  . Lactose     Other reaction(s): GI Upset (intolerance)  . Other     GLUTEN RESTRICTED LACTOSE  INTOLERANT ALLERGIC TO NUTS AND CORN  . Penicillins     REACTION: per allergy testing/Patient states she has taken Augmentin without complications.   . Pregabalin     REACTION: headache, several side effects  . Sulfonamide Derivatives     REACTION: Hives, wheezing  . Cefdinir Rash    Review of Systems  Constitutional: Positive for chills, fever and malaise/fatigue.  HENT: Positive for congestion, ear pain, sinus pain and sore throat. Negative for ear discharge.   Eyes: Negative for pain and discharge.  Respiratory: Negative for cough, sputum production, shortness of breath, wheezing and stridor.   Cardiovascular: Negative for chest pain.  Gastrointestinal: Negative for diarrhea, nausea and vomiting.  Musculoskeletal: Positive for myalgias. Negative for neck pain.  Skin: Negative for rash.  Neurological: Positive for headaches. Negative for dizziness.  Endo/Heme/Allergies: Negative for environmental allergies.  Psychiatric/Behavioral: The patient has insomnia.     Objective:   Blood pressure (!) 96/55, pulse 76, temperature 98 F (36.7 C), temperature source Oral, height 5\' 6"  (1.676 m), weight 144 lb 11.2 oz (65.6 kg). Body mass index is 23.36 kg/m. General: Well Developed, well nourished, appropriate for stated age.  Neuro: Alert and oriented x3, extra-ocular muscles intact, sensation grossly intact.  HEENT: Normocephalic, atraumatic, pupils equal round reactive to light, neck supple, no masses, no painful lymphadenopathy, TM's intact B/L, no acute findings. Nares- patent, clear d/c, OP- clear, mild erythema, No TTP sinuses Skin: Warm and dry, no gross rash. Cardiac: RRR, S1 S2,  no murmurs rubs or gallops.  Respiratory: ECTA B/L and A/P, Not using accessory muscles, speaking in full sentences- unlabored. Vascular:  No gross lower ext edema, cap RF less 2 sec. Psych: No HI/SI, judgement and insight good, Euthymic mood. Full Affect.   Patient Care Team    Relationship  Specialty Notifications Start End  [name], DO PCP - General Family Medicine  02/20/16   Heath Lark, MD Consulting Physician Hematology and Oncology  01/02/15   Suella Broad, MD Consulting Physician Physical Medicine and Rehabilitation  04/20/16    Comment: lumbar injections  Myrlene Broker, MD Attending Physician Urology  04/20/16   Tyler Pita, MD Referring Physician Physical Medicine and Rehabilitation  04/20/16    Comment: Pain medicine  Katy Apo, MD Consulting Physician Ophthalmology  04/20/16    Comment: uveitis  Juanda Chance, NP Nurse Practitioner Obstetrics and Gynecology  04/20/16   Druscilla Brownie, MD Consulting Physician Dermatology  04/20/16    Comment: Lennie Odor, NP

## 2016-07-29 NOTE — Patient Instructions (Signed)
  Use humidifier daily/ nightly in room you are in.  STOP sudafed  Take ABX  Sinus rinses

## 2016-07-29 NOTE — Assessment & Plan Note (Signed)
For her chornic back and eck pain--> take elavil --> which worked so wel lin the past for her---> will giv eher RF

## 2016-07-29 NOTE — Assessment & Plan Note (Signed)
TOok elavil initially for FM--> but pt realized it helped her sleep so well and her RLS.   Needs RF- but at home has 50mg --> too strong.  Needs 25mg 

## 2016-08-23 DIAGNOSIS — D1801 Hemangioma of skin and subcutaneous tissue: Secondary | ICD-10-CM | POA: Diagnosis not present

## 2016-08-23 DIAGNOSIS — L814 Other melanin hyperpigmentation: Secondary | ICD-10-CM | POA: Diagnosis not present

## 2016-08-23 DIAGNOSIS — D225 Melanocytic nevi of trunk: Secondary | ICD-10-CM | POA: Diagnosis not present

## 2016-08-23 DIAGNOSIS — L579 Skin changes due to chronic exposure to nonionizing radiation, unspecified: Secondary | ICD-10-CM | POA: Diagnosis not present

## 2016-08-23 DIAGNOSIS — L821 Other seborrheic keratosis: Secondary | ICD-10-CM | POA: Diagnosis not present

## 2016-08-23 DIAGNOSIS — L219 Seborrheic dermatitis, unspecified: Secondary | ICD-10-CM | POA: Diagnosis not present

## 2016-08-31 ENCOUNTER — Other Ambulatory Visit: Payer: Self-pay | Admitting: Adult Health

## 2016-08-31 ENCOUNTER — Telehealth: Payer: Self-pay | Admitting: Family Medicine

## 2016-08-31 DIAGNOSIS — H6690 Otitis media, unspecified, unspecified ear: Secondary | ICD-10-CM

## 2016-08-31 MED ORDER — AMOXICILLIN-POT CLAVULANATE 875-125 MG PO TABS: 1.0000 | ORAL_TABLET | Freq: Two times a day (BID) | ORAL | 0 refills | Status: DC

## 2016-08-31 NOTE — Progress Notes (Signed)
D/Cd Amoxicillin due to ineffectiveness. Sent in Augmentin per pt. Request.  Chart indicates that she can tolerate Augmentin without SEs.  Pt. Called/updated.

## 2016-08-31 NOTE — Telephone Encounter (Signed)
Pt called states she is still having problems/ pain  W/ right ear infection--- request Rx for  Augmentin instead of Amoxicillin be called into [facility].  Thanks Baker Janus

## 2016-08-31 NOTE — Telephone Encounter (Signed)
Afternoon Baker Janus, Can you please call Ms. Jerome and tell her to stop Amoxicillin and start Augmentin.  Please call with any other problems. Thanks! Valetta Fuller

## 2016-09-01 NOTE — Telephone Encounter (Signed)
Pt informed.  Pt expressed understanding and is agreeable.  T. Nelson, CMA  

## 2016-09-10 DIAGNOSIS — M79671 Pain in right foot: Secondary | ICD-10-CM | POA: Diagnosis not present

## 2016-09-10 DIAGNOSIS — M25571 Pain in right ankle and joints of right foot: Secondary | ICD-10-CM | POA: Diagnosis not present

## 2016-09-10 DIAGNOSIS — G8929 Other chronic pain: Secondary | ICD-10-CM | POA: Diagnosis not present

## 2016-09-14 DIAGNOSIS — G8929 Other chronic pain: Secondary | ICD-10-CM | POA: Diagnosis not present

## 2016-09-14 DIAGNOSIS — M79671 Pain in right foot: Secondary | ICD-10-CM | POA: Diagnosis not present

## 2016-09-17 DIAGNOSIS — G8929 Other chronic pain: Secondary | ICD-10-CM | POA: Diagnosis not present

## 2016-09-17 DIAGNOSIS — M79671 Pain in right foot: Secondary | ICD-10-CM | POA: Diagnosis not present

## 2016-09-21 ENCOUNTER — Encounter: Payer: Self-pay | Admitting: Adult Health

## 2016-09-21 ENCOUNTER — Ambulatory Visit (INDEPENDENT_AMBULATORY_CARE_PROVIDER_SITE_OTHER): Payer: 59 | Admitting: Adult Health

## 2016-09-21 ENCOUNTER — Telehealth: Payer: Self-pay | Admitting: Family Medicine

## 2016-09-21 VITALS — BP 119/75 | HR 76 | Temp 97.8°F | Ht 66.0 in | Wt 143.4 lb

## 2016-09-21 DIAGNOSIS — R42 Dizziness and giddiness: Secondary | ICD-10-CM

## 2016-09-21 LAB — COMPREHENSIVE METABOLIC PANEL WITH GFR
ALT: 15
AST: 18 U/L
Albumin Serum: 4.2
Albumin/Globulin Ratio: 2
Alkaline Phosphatase, S: 53
BUN/Creatinine Ratio: 30
BUN: 20
Calcium, Ser: 9.5
Carbon Dioxide, Total: 24
Chloride, Serum: 108
Creatinine, Ser: 0.67
EGFR (African American): 101
EGFR (Non-African Amer.): 87
Globulin, Total: 2.1
Glucose: 84
Potassium, serum: 4.9
Sodium, serum: 141
Total Bilirubin: 0.4 mg/dL
Total Protein: 6.3 g/dL

## 2016-09-21 MED ORDER — ONDANSETRON HCL 4 MG PO TABS: 4.0000 mg | ORAL_TABLET | Freq: Three times a day (TID) | ORAL | 1 refills | Status: DC | PRN

## 2016-09-21 NOTE — Assessment & Plan Note (Signed)
Vertigo vs. Vestibular migraine. Continue excellent water intake, reduce use of lemon in water. Rest, do not drive, perform strenuous exercise. Continue OTC Antihistamine. Ondansetron as needed. Due to hx of oversedation with narcotics and benzodiazapines-will not Rx at this time. Discussed Red Flag Sx's at length, call EMS is any develop. If sx's persist/worsen then please RTC and we will proceed with more in-depth work up.

## 2016-09-21 NOTE — Telephone Encounter (Signed)
Pt instructed to come in immediately. Pt states that she does not have anyone to drive her to the office and insists she will drive herself, despite being warned against it.  Charyl Bigger, CMA

## 2016-09-21 NOTE — Telephone Encounter (Signed)
Pt called states about 3am this morning had a severe dizzy spell on the way to bathroom, she fell against door hitting her head--went back to bed-- just got up a few minutes ago still feeling very dizzy and wants to come in to see doctor--unfortunately advised that of no openings and suggest she visit UR or Er, and to call an ambulance if unable to drive or no one to take her--frwding note to nurse for review---glh

## 2016-09-21 NOTE — Patient Instructions (Addendum)

## 2016-09-21 NOTE — Progress Notes (Signed)
Subjective:    Patient ID: Leslie Dixon, female    DOB: [birth year], 71 y.o.   MRN: [id number]  HPI:  [name] presents with dizziness, right sided HA (3/10), and had an episode of "extreme dizziness this morning around 0300".  She reports that her seasonal allergies have started to bother her yesterday.  She awoke due to right sided HA and attempted to go to the bathroom, upon standing she experienced severe dizziness and fell/leaned on bathroom door with left side of body.  She denies striking her head, LOC.  She reports that the dizziness subsided and she was able to "ket the dog out, go to the restroom then back to bed".  Then her dog awoke her at 0500 and she reports that the "dizziness was worse and I had to hold onto walls/furniture to get out of bed and exit room".  She denies CP/dyspnea/N/V/change in vision/palpitations.  She denies this ever occurring prior.  She denies personal or family hx of vertigo.  She does have hx of migraines and seasonal allergies.  She denies hx of cardiac or neurological disorders.  She is well dressed, clean, and completely coherent (although very anxious).  She had a half a cup of coffee this morning and perform all of her ADLs and drive herself to clinic today.    Review of Systems  Constitutional: Positive for activity change and appetite change. Negative for chills, diaphoresis, fatigue, fever and unexpected weight change.  HENT: Positive for congestion, postnasal drip and sinus pressure.   Eyes: Negative for visual disturbance.  Respiratory: Negative for cough and shortness of breath.   Cardiovascular: Negative for chest pain, palpitations and leg swelling.  Gastrointestinal: Positive for nausea. Negative for abdominal pain, constipation, diarrhea and vomiting.  Endocrine: Negative for cold intolerance, heat intolerance, polydipsia, polyphagia and polyuria.  Genitourinary: Negative for difficulty urinating and flank pain.  Musculoskeletal: Positive  for arthralgias, back pain, gait problem, joint swelling, myalgias, neck pain and neck stiffness.  Skin: Negative for color change, pallor, rash and wound.  Neurological: Positive for dizziness and headaches. Negative for tremors, seizures, syncope, facial asymmetry, speech difficulty, weakness, light-headedness and numbness.  Psychiatric/Behavioral: Negative for agitation, behavioral problems, confusion, decreased concentration, dysphoric mood, hallucinations, self-injury, sleep disturbance and suicidal ideas. The patient is nervous/anxious. The patient is not hyperactive.        Objective:   Physical Exam  Constitutional: She is oriented to person, place, and time. She appears well-developed and well-nourished. She appears distressed.  HENT:  Head: Normocephalic and atraumatic.  Right Ear: Hearing and external ear normal. Tympanic membrane is bulging. Tympanic membrane is not erythematous.  Left Ear: Hearing and external ear normal. Tympanic membrane is not erythematous and not bulging.  Nose: Rhinorrhea present.  Eyes: Conjunctivae are normal. Pupils are equal, round, and reactive to light. Right eye exhibits nystagmus. Left eye exhibits nystagmus. Right pupil is round and reactive. Left pupil is round and reactive. Pupils are equal.  Bil nystagmus with [facility].    Neck: Decreased range of motion present.  Hx of cervical neck fusion, which makes it difficult to rotated head from side to side.   Cardiovascular: Normal rate, regular rhythm, normal heart sounds and intact distal pulses.   Pulmonary/Chest: Breath sounds normal. No respiratory distress. She has no wheezes. She has no rales. She exhibits no tenderness.  Abdominal: Soft. Bowel sounds are normal. She exhibits no distension.  Musculoskeletal: She exhibits tenderness.       Right  ankle: She exhibits decreased range of motion. Achilles tendon exhibits defect.       Cervical back: She exhibits decreased range of motion  and tenderness.  Hx of chronic right foot drop.  Lymphadenopathy:    She has no cervical adenopathy.  Neurological: She is alert and oriented to person, place, and time. She displays atrophy. She displays no tremor and normal reflexes. No cranial nerve deficit or sensory deficit. She exhibits normal muscle tone. She displays a negative Romberg sign. She displays no seizure activity. Coordination abnormal.  Right foot drop-chronic in nature.  Skin: Skin is warm and dry. No rash noted. She is not diaphoretic. No erythema. No pallor.  Psychiatric: Her behavior is normal. Judgment and thought content normal. Her mood appears anxious. Cognition and memory are normal.  Very anxious and concerned.   Able to calm her with clearing and slowly explaining all possible diff dx's and  treatment plan.  Nursing note and vitals reviewed.         Assessment & Plan:   1. Vertigo   2. Dizziness     Vertigo Vertigo vs. Vestibular migraine. Continue excellent water intake, reduce use of lemon in water. Rest, do not drive, perform strenuous exercise. Continue OTC Antihistamine. Ondansetron as needed. Due to hx of oversedation with narcotics and benzodiazapines-will not Rx at this time. Discussed Red Flag Sx's at length, call EMS is any develop. If sx's persist/worsen then please RTC and we will proceed with more in-depth work up.    FOLLOW-UP:  Return if symptoms worsen or fail to improve.

## 2016-09-22 ENCOUNTER — Telehealth: Payer: Self-pay

## 2016-09-22 NOTE — Telephone Encounter (Signed)
Pt states that she is feeling better this morning, but did have a couple of episodes during the night when she turned her head to fast.  Pt inquired as to wether or not she should go to PT tomorrow.  Per Lillard Anes, NP pt is not to do PT the remainder of this week and may resume next week.  Pt expressed understanding and is agreeable.  Charyl Bigger, CMA

## 2016-09-23 ENCOUNTER — Telehealth: Payer: Self-pay

## 2016-09-23 NOTE — Telephone Encounter (Signed)
Pt states that she is about the same as yesterday but is better than she was 2 days ago at her OV.  She states that both of her ears are hurting now.  Should pt try a decongestant for the pressure in her ears?  Please advise.  Charyl Bigger, CMA

## 2016-09-23 NOTE — Telephone Encounter (Signed)
Pt informed.  Pt expressed understanding and is agreeable.  T. Nelson, CMA  

## 2016-09-23 NOTE — Telephone Encounter (Signed)
Good Afternoon Tonya, Can you please call Ms. Kreisler: 1) How is she feeling? 2) Yes, please try an OTC decongestant and push fluids! 3) If she still isn't better by next week then please call for appt. Thanks! Valetta Fuller

## 2016-09-28 ENCOUNTER — Telehealth: Payer: Self-pay | Admitting: Family Medicine

## 2016-09-28 ENCOUNTER — Ambulatory Visit: Payer: 59 | Admitting: Primary Care

## 2016-09-28 ENCOUNTER — Other Ambulatory Visit: Payer: Self-pay | Admitting: Adult Health

## 2016-09-28 DIAGNOSIS — H6693 Otitis media, unspecified, bilateral: Secondary | ICD-10-CM

## 2016-09-28 MED ORDER — CEFDINIR 300 MG PO CAPS: 300.0000 mg | ORAL_CAPSULE | Freq: Two times a day (BID) | ORAL | 0 refills | Status: DC

## 2016-09-28 NOTE — Progress Notes (Signed)
Continues to have intermittent dizziness, however "nothing like last week and it has been improving".   She has bil ear pressure and pain, and reports that her ears "pop a lot, all day". She completed course of Augmentin 4 weeks ago-no reactions noted. Will try on course of Cefdinir 300mg  PO BID x 10. Continue to push water and rest. Please check in with [place] at end of the week.

## 2016-09-28 NOTE — Telephone Encounter (Signed)
Pt called states she is still feeling extremely dizzy and wants to come in to see Katy--- pls cll her asap. --glh

## 2016-09-29 DIAGNOSIS — M79671 Pain in right foot: Secondary | ICD-10-CM | POA: Diagnosis not present

## 2016-09-29 DIAGNOSIS — G8929 Other chronic pain: Secondary | ICD-10-CM | POA: Diagnosis not present

## 2016-09-30 DIAGNOSIS — H9203 Otalgia, bilateral: Secondary | ICD-10-CM | POA: Diagnosis not present

## 2016-09-30 DIAGNOSIS — H903 Sensorineural hearing loss, bilateral: Secondary | ICD-10-CM | POA: Diagnosis not present

## 2016-09-30 DIAGNOSIS — H9071 Mixed conductive and sensorineural hearing loss, unilateral, right ear, with unrestricted hearing on the contralateral side: Secondary | ICD-10-CM | POA: Insufficient documentation

## 2016-09-30 DIAGNOSIS — H6983 Other specified disorders of Eustachian tube, bilateral: Secondary | ICD-10-CM | POA: Diagnosis not present

## 2016-09-30 DIAGNOSIS — H9113 Presbycusis, bilateral: Secondary | ICD-10-CM | POA: Diagnosis not present

## 2016-09-30 DIAGNOSIS — H90A31 Mixed conductive and sensorineural hearing loss, unilateral, right ear with restricted hearing on the contralateral side: Secondary | ICD-10-CM | POA: Diagnosis not present

## 2016-10-01 DIAGNOSIS — G8929 Other chronic pain: Secondary | ICD-10-CM | POA: Diagnosis not present

## 2016-10-01 DIAGNOSIS — M79671 Pain in right foot: Secondary | ICD-10-CM | POA: Diagnosis not present

## 2016-10-05 DIAGNOSIS — G8929 Other chronic pain: Secondary | ICD-10-CM | POA: Diagnosis not present

## 2016-10-05 DIAGNOSIS — M79671 Pain in right foot: Secondary | ICD-10-CM | POA: Diagnosis not present

## 2016-10-08 DIAGNOSIS — G8929 Other chronic pain: Secondary | ICD-10-CM | POA: Diagnosis not present

## 2016-10-08 DIAGNOSIS — M79671 Pain in right foot: Secondary | ICD-10-CM | POA: Diagnosis not present

## 2016-10-08 DIAGNOSIS — M25571 Pain in right ankle and joints of right foot: Secondary | ICD-10-CM | POA: Diagnosis not present

## 2016-10-18 ENCOUNTER — Ambulatory Visit: Payer: 59 | Admitting: Family Medicine

## 2016-10-18 DIAGNOSIS — N644 Mastodynia: Secondary | ICD-10-CM | POA: Diagnosis not present

## 2016-10-20 ENCOUNTER — Other Ambulatory Visit: Payer: Self-pay | Admitting: Obstetrics and Gynecology

## 2016-10-20 DIAGNOSIS — N644 Mastodynia: Secondary | ICD-10-CM

## 2016-10-22 ENCOUNTER — Ambulatory Visit
Admission: RE | Admit: 2016-10-22 | Discharge: 2016-10-22 | Disposition: A | Discharge status: Home / Self Care | Payer: 59 | Source: Ambulatory Visit | Attending: Obstetrics and Gynecology | Admitting: Obstetrics and Gynecology

## 2016-10-22 DIAGNOSIS — N644 Mastodynia: Secondary | ICD-10-CM

## 2016-11-08 DIAGNOSIS — M25571 Pain in right ankle and joints of right foot: Secondary | ICD-10-CM | POA: Diagnosis not present

## 2016-11-08 DIAGNOSIS — G8929 Other chronic pain: Secondary | ICD-10-CM | POA: Diagnosis not present

## 2016-11-09 DIAGNOSIS — T300 Burn of unspecified body region, unspecified degree: Secondary | ICD-10-CM | POA: Diagnosis not present

## 2016-11-09 DIAGNOSIS — L309 Dermatitis, unspecified: Secondary | ICD-10-CM | POA: Diagnosis not present

## 2016-11-09 DIAGNOSIS — D485 Neoplasm of uncertain behavior of skin: Secondary | ICD-10-CM | POA: Diagnosis not present

## 2016-11-16 DIAGNOSIS — H6981 Other specified disorders of Eustachian tube, right ear: Secondary | ICD-10-CM | POA: Diagnosis not present

## 2016-11-16 DIAGNOSIS — J301 Allergic rhinitis due to pollen: Secondary | ICD-10-CM | POA: Diagnosis not present

## 2016-11-19 ENCOUNTER — Ambulatory Visit (INDEPENDENT_AMBULATORY_CARE_PROVIDER_SITE_OTHER): Payer: 59 | Admitting: Family Medicine

## 2016-11-19 ENCOUNTER — Encounter: Payer: Self-pay | Admitting: Family Medicine

## 2016-11-19 VITALS — BP 100/55 | HR 79

## 2016-11-19 DIAGNOSIS — R42 Dizziness and giddiness: Secondary | ICD-10-CM

## 2016-11-19 DIAGNOSIS — G43109 Migraine with aura, not intractable, without status migrainosus: Secondary | ICD-10-CM | POA: Diagnosis not present

## 2016-11-19 DIAGNOSIS — J3089 Other allergic rhinitis: Secondary | ICD-10-CM

## 2016-11-19 DIAGNOSIS — H65493 Other chronic nonsuppurative otitis media, bilateral: Secondary | ICD-10-CM | POA: Diagnosis not present

## 2016-11-19 DIAGNOSIS — G8929 Other chronic pain: Secondary | ICD-10-CM

## 2016-11-19 DIAGNOSIS — M549 Dorsalgia, unspecified: Secondary | ICD-10-CM

## 2016-11-19 DIAGNOSIS — H6983 Other specified disorders of Eustachian tube, bilateral: Secondary | ICD-10-CM

## 2016-11-19 MED ORDER — MECLIZINE HCL 25 MG PO TABS: 25.0000 mg | ORAL_TABLET | Freq: Three times a day (TID) | ORAL | 0 refills | Status: DC | PRN

## 2016-11-19 NOTE — Progress Notes (Signed)
Impression and Recommendations:    1. Chronic vertigo   2. ETD (Eustachian tube dysfunction), bilateral ( R.> er L )    3. Environmental and seasonal allergies   4. Chronic back pain, unspecified back location, unspecified back pain laterality   5. Ocular migraine   6. Vertigo   7. 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   New Prescriptions   MECLIZINE (ANTIVERT) 25 MG TABLET    Take 1 tablet (25 mg total) by mouth 3 (three) times daily as needed for dizziness.     Orders Placed This Encounter  Procedures  . Ambulatory referral to Physical Therapy    Gross side effects, risk and benefits, and alternatives of medications and treatment plan in general discussed with patient.  Patient is aware that all medications have potential side effects and we are unable to predict every side effect or drug-drug interaction that may occur.   Patient will call with any questions prior to using medication if they have concerns.  Expresses verbal understanding and consents to current therapy and treatment regimen.  No barriers to understanding were identified.  Red flag symptoms and signs discussed in detail.  Patient expressed understanding regarding what to do in case of emergency\urgent symptoms  Please see AVS handed out to patient at the end of our visit for further patient instructions/ counseling done pertaining to today's office visit.   Return if symptoms worsen or fail to improve.     Note: This document was prepared using Dragon voice recognition software  and may include unintentional dictation errors.   --------------------------------------------------------------------------------------------------------------------------------------------------------------------------------------------------------------------------------------------    Subjective:    CC:  Chief Complaint  Patient presents with  . Dizziness    HPI: Leslie Dixon is a 71 y.o. female who presents to [place] at [facility] today for issues as discussed below.   Vertigo has been bad since Feb- on and off.  THis is not a new problem, nor are any of her sx new in nature.   She has been seen by two specialists for this issue but is here for my input and direction.   --> ears felt full and painful and head congestion; began room spinning sx with changes in head position, esp going from being bent over to looking upwrd --> sent to ENT by Arvil Persons, NP- saw doc on 09/30/16- dr Constance Holster [facility]. "He did nothing for her. "   --> went to another ENT doc--> at [facility] - Dr Pryor Ochoa - saw him Monday, 11/16/16.   He talked about tubes and possibly CT scan also gave her prednisone and dymista- ( which she could not afford the latter ).   Due to see him again in 2 wks.     --->  Recently:  Tues this wk---> felt nauseated even, as dizziness got so bad one time, she felt she was going to fall over.  ( was bent over and lifted a potted plant up onto a hook  on her porch that was overhead. ) She did not fall though- almost did.   No New Sx.  As usual.   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  When she gets aura- takes caffeine and usually eases it.    Her back pain and chronic drop foot got better with recent PT she had a [place] [facility] and pt is very pleased exercises is all it took.       Wt Readings from Last 3 Encounters:  09/21/16 143 lb 6.4 oz (65 kg)  07/29/16 144 lb 11.2 oz (65.6 kg)  05/27/16 142 lb (64.4 kg)   BP Readings from Last 3 Encounters:  11/19/16 (!)  100/55  09/21/16 119/75  07/29/16 (!) 96/55   Pulse Readings from Last 3 Encounters:  11/19/16 79  09/21/16 76  07/29/16 76   BMI Readings from Last 3 Encounters:  09/21/16 23.15 kg/m  07/29/16 23.36 kg/m  05/27/16 22.92 kg/m     Patient Care Team    Relationship Specialty Notifications Start End  [name], DO PCP - General Family Medicine  02/20/16   Heath Lark, MD Consulting Physician Hematology and Oncology  01/02/15   Suella Broad, MD Consulting Physician Physical Medicine and Rehabilitation  04/20/16    Comment: lumbar injections  Myrlene Broker, MD Attending Physician Urology  04/20/16   Tyler Pita, MD Referring Physician Physical Medicine and Rehabilitation  04/20/16    Comment: Pain medicine  Katy Apo, MD Consulting Physician Ophthalmology  04/20/16    Comment: uveitis  Juanda Chance, NP Nurse Practitioner Obstetrics and Gynecology  04/20/16   Druscilla Brownie, MD Consulting Physician Dermatology  04/20/16    Comment: Lennie Odor, NP     Patient Active Problem List   Diagnosis Date Noted  . Chronic vertigo 09/21/2016    Priority: High  . h/o GAD (had panic in past) 02/12/2016    Priority: High  . Fibromyalgia 03/07/2007    Priority: High  . GERD (gastroesophageal reflux disease)     Priority: Medium  . Osteoporosis     Priority: Medium  . Chronic back pain     Priority: Medium  . LACTOSE INTOLERANCE 03/07/2007    Priority: Medium  . OA (osteoarthritis) 03/07/2007    Priority: Medium  . ETD (Eustachian tube dysfunction), bilateral  ( R >er L ) 11/19/2016    Priority: Low  . 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 Insomnia 07/29/2016    Priority: Low  . History of non anemic vitamin B12 deficiency 05/02/2016    Priority: Low  . Environmental and seasonal allergies 04/20/2016    Priority: Low  . Idiopathic benign Leukopenia     Priority: Low  . Ocular migraine 03/07/2007    Priority: Low  .  Right thyroid nodule 05/28/2016  . Intolerance to cold 05/02/2016  . Encounter for wellness examination 04/20/2016  . Can't get food down 04/10/2012  . Difficulty speaking 04/10/2012  . Hydronephrosis 03/22/2012  . Back pain   . Chronic infection of sinus 07/09/2011  . Blood in the urine 05/06/2011  . 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 Calculus of kidney 05/06/2011  . HYPOKALEMIA 01/03/2008  . Allergic rhinitis due to pollen 12/13/2007  . 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 CARPAL TUNNEL SYNDROME 03/07/2007  . VARICOSE VEIN 03/07/2007  . NEPHROLITHIASIS, HX OF 03/07/2007    Past Medical history, Surgical history, Family history, Social history, Allergies and  Medications have been entered into the medical record, reviewed and changed as needed.    Current Meds  Medication Sig  . amitriptyline (ELAVIL) 25 MG tablet Take 1 tablet (25 mg total) by mouth at bedtime.  . Cholecalciferol (VITAMIN D3) 5000 units TABS 5,000 IU OTC vitamin D3 daily.  . Ginger Root POWD 50 mg by Does not apply route daily at 8 pm.  . ibandronate (BONIVA) 150 MG tablet Take 1 tablet (150 mg total) by mouth every 30 (thirty) days.  Marland Kitchen loratadine (CLARITIN) 10 MG tablet Take by mouth.  . meloxicam (MOBIC) 7.5 MG tablet Take 1 tablet (7.5 mg total) by mouth daily.  . traMADol (ULTRAM) 50 MG tablet Take 1 tablet (50 mg total) by mouth every 6 (six) hours as needed for moderate pain.  Marland Kitchen triamcinolone (NASACORT ALLERGY 24HR) 55 MCG/ACT AERO nasal inhaler Place 1 spray into the nose daily.  . Turmeric 500 MG CAPS Take 2 capsules by mouth daily at 8 pm.  . vitamin B-12 (CYANOCOBALAMIN) 1000 MCG tablet Take 1,000 mcg by mouth daily.  . vitamin C (ASCORBIC ACID) 500 MG tablet Take 500 mg by mouth daily.  . vitamin E 400 UNIT capsule Take 400 Units by mouth daily.    Allergies:  Allergies  Allergen Reactions  . Peanut Oil Anaphylaxis  . Clarithromycin     REACTION: GI  . Codeine     REACTION: nausea and vomiting  .  Lactose     Other reaction(s): GI Upset (intolerance)  . 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 Penicillins     REACTION: per allergy testing/Patient states she has taken Augmentin without complications.   . Pregabalin     REACTION: headache, several side effects  . Sulfonamide Derivatives     REACTION: Hives, wheezing  . Cefdinir Rash     Review of Systems: General:   Denies fever, chills, unexplained weight loss.  Optho/Auditory:   Denies visual changes, blurred vision/LOV Respiratory:   Denies wheeze, DOE more than baseline levels.  Cardiovascular:   Denies chest pain, palpitations, new onset peripheral edema  Gastrointestinal:   Denies nausea, vomiting, diarrhea, abd pain.  Genitourinary: Denies dysuria, freq/ urgency, flank pain or discharge from genitals.  Endocrine:     Denies hot or cold intolerance, polyuria, polydipsia. Musculoskeletal:   Denies unexplained myalgias, joint swelling, unexplained arthralgias, gait problems.  Skin:  Denies new onset rash, suspicious lesions Neurological:     Denies dizziness, unexplained weakness, numbness  Psychiatric/Behavioral:   Denies mood changes, suicidal or homicidal ideations, hallucinations       Objective:   Blood pressure (!) 100/55, pulse 79. There is no height or weight on file to calculate BMI. General:  Well Developed, well nourished, appropriate for stated age.  Neuro:  Alert and oriented,  extra-ocular muscles intact, + mod Hallpikes- reprod sx on R esp.  HEENT:  Normocephalic, atraumatic, neck supple, no carotid bruits appreciated, R chronic severe-to-moderate effusion with scarring with air-fluid levels, L- mild to mod effusion Skin:  no gross rash, warm, pink. Cardiac:  RRR, S1 S2 Respiratory:  ECTA B/L and A/P, Not using accessory muscles, speaking in full sentences- unlabored. Vascular:  Ext warm, no cyanosis apprec.; cap RF less 2 sec. Psych:  No HI/SI, judgement and  insight good, Euthymic mood. Full Affect.

## 2016-11-19 NOTE — Patient Instructions (Addendum)
The prednisone and flonase will help with the inflammation in your sinuses and ultimately with your sx.  As well, do your exercises!  These are extremely important in helping with your sx.    The Meclizine only helps with the symptoms of vertigo--> it will not correct the issues like the exercises and steroids will.     Consider nasal flushes/ neil med sinus rinses three times a day.      How to Treat Vertigo at Home with Exercises  What is Vertigo?  Vertigo is a relatively common symptom most often associated with conditions such as sinusitis (inflammation of your sinuses due to viruses, allergies, or bacterial infections), or an inner ear infection or ear trauma.   It can be brought on by trauma (e.g. a blow to the head or whiplash) or more serious things like minor strokes.   Symptoms can also be brought on by normal degenerative changes to your inner ear that occur with aging.  The condition tends to be more commonly seen in the elderly but it can occur in all ages.    Patients most often complain of dizziness, as if the room is spinning around them.   Symptoms are provoked by quick head movements or changes in position like going from standing to lying in bed, or even turning over in bed.   It may present with nausea and/or vomiting, and can be very debilitating to some folks.    By far the most common cause, known as Benign Paroxysmal Positional Vertigo (BPPV), is categorized by a sudden onset of symptoms, that are intense but short-lived (60 seconds or less), which is triggered by a change in head position.   Symptoms usually dissipate if you stay in one position and do not move your head.   Within the inner ear are collections of calcium carbonate crystals referred to as "otoliths" which may become dislodged from their normal position and migrate into the semicircular canals of the inner ear, throwing off your body's ability to sense where you are in space.     Fig. 921 Anatomy of the Right  Osseous Labyrinth. Antonieta Iba. Anatomy of the Human Body. 1918.            What Else Could Be Behind My Vertigo?  Some other causes of vertigo include:  Meniere's disease (disorder of inner ear with ringing in ears, feeling of fullness/pressure within ear, and fluctuating hearing loss) Tumours Neurological disorders e.g. Multiple Sclerosis Motion Sickness (lack of coordination between visual stimuli, inner ear balance and positional sense) Migraine Labyrinthitis (inflammation of the fluid-filled tubes and sacs within the inner ear; may also be associated with changes in hearing) Vestibular neuritis (inflammation of the nerves associated with transmission of sensory info from the inner ear; usually of viral origins)  How it can be treated/cured? While certain medications have been prescribed for vertigo including Lorazepam your doing well 7 house the house going organizing and getting things ready for sale with the and Meclizine (for motion sickness), there exists no evidence to support a recommendation of any medication in the routine treatment of BPPV.  Clinical trials have demonstrated that repositioning techniques (listed below) are a superior option for management Otis Dials et al., 2008).    Figure above:  (A) Instructions for the modified Epley procedure (MEP) for left ear posterior canal benign paroxysmal positional vertigo (PC-BPPV). For right ear BPPV, the procedure has to be performed in the opposite direction, starting with the head turned to the right  side.  1. Start by sitting on a bed with your head turned 45 to the left. Place a pillow behind you so that on lying back it will be under your shoulders.  2. Lie back quickly with shoulders on the pillow, neck extended, and head resting on the bed. In this position, the affected (left) ear is underneath. Wait for 30 secondS.  3. Turn your head 90 to the right (without raising it), and wait again for 30 seconds.  4. Turn your body  and head another 90 to the right, and wait for another 30 seconds.  5. Sit up on the right side. This maneuver should be performed three times a day. Repeat this daily until you are free from positional vertigo for 24 hours.   (B) Instructions for the modified Semont maneuver (MSM) for left ear PC-BPPV. For right ear BPPV, the maneuver has to be performed in the opposite direction, starting with the head turned toward the left ear.  1. Sit upright on a bed with your head turned 45 toward the right ear.  2. Drop quickly to the left side, so that your head touches the bed behind your left ear. Wait 30 seconds.  3. Move head and trunk in a swift movement toward the other side without stopping in the upright position, so that your head comes to rest on the right side of your forehead. Wait again for 30 seconds.  4. Sit up again.  This maneuver should be performed three times a day. Repeat this daily until you are free from positional vertigo symptoms for 24 hours.   (   See the video in the supplementary material on the NeurologyWeb site; go to http://www.neurology.org/content/63/1/150/F1.expansion.html   )     You can also try this motion at home as well- Self-Treatment of Benign Paroxysmal Positional Vertigo Benign Paroxysmal Positioning Vertigo is caused by loose inner ear crystals in the inner ear that migrate while sleeping to the back-bottom inner ear balance canal, the so-called "posterior semi-circular canal." The maneuver demonstrated below is the way to reposition the loose crystals so that the symptoms caused by the loose crystals go away. You may have a floating, swaying sense while walking or sitting for a few days after this procedure.

## 2016-11-23 ENCOUNTER — Ambulatory Visit: Payer: 59 | Admitting: Family Medicine

## 2016-12-02 ENCOUNTER — Ambulatory Visit (INDEPENDENT_AMBULATORY_CARE_PROVIDER_SITE_OTHER): Payer: 59 | Admitting: Family Medicine

## 2016-12-02 ENCOUNTER — Encounter: Payer: Self-pay | Admitting: Family Medicine

## 2016-12-02 VITALS — BP 116/64 | HR 70 | Temp 98.5°F | Resp 70 | Wt 140.0 lb

## 2016-12-02 DIAGNOSIS — R062 Wheezing: Secondary | ICD-10-CM

## 2016-12-02 DIAGNOSIS — R0982 Postnasal drip: Secondary | ICD-10-CM | POA: Diagnosis not present

## 2016-12-02 DIAGNOSIS — R059 Cough, unspecified: Secondary | ICD-10-CM

## 2016-12-02 DIAGNOSIS — J3089 Other allergic rhinitis: Secondary | ICD-10-CM

## 2016-12-02 DIAGNOSIS — R05 Cough: Secondary | ICD-10-CM | POA: Diagnosis not present

## 2016-12-02 DIAGNOSIS — R0602 Shortness of breath: Secondary | ICD-10-CM | POA: Diagnosis not present

## 2016-12-02 MED ORDER — ALBUTEROL SULFATE HFA 108 (90 BASE) MCG/ACT IN AERS: 1.0000 | INHALATION_SPRAY | RESPIRATORY_TRACT | 1 refills | Status: DC | PRN

## 2016-12-02 NOTE — Patient Instructions (Signed)
Shortness of Breath, Adult  Shortness of breath means you have trouble breathing. Your lungs are organs for breathing.  Follow these instructions at home:  Pay attention to any changes in your symptoms. Take these actions to help with your condition:  ? Do not smoke. Smoking can cause shortness of breath. If you need help to quit smoking, ask your doctor.  ? Avoid things that can make it harder to breathe, such as:  ? Mold.  ? Dust.  ? Air pollution.  ? Chemical smells.  ? Things that can cause allergy symptoms (allergens), if you have allergies.  ? Keep your living space clean and free of mold and dust.  ? Rest as needed. Slowly return to your usual activities.  ? Take over-the-counter and prescription medicines, including oxygen and inhaled medicines, only as told by your doctor.  ? Keep all follow-up visits as told by your doctor. This is important.  Contact a doctor if:  ? Your condition does not get better as soon as expected.  ? You have a hard time doing your normal activities, even after you rest.  ? You have new symptoms.  Get help right away if:  ? You have trouble breathing when you are resting.  ? You feel light-headed or you faint.  ? You have a cough that is not helped by medicines.  ? You cough up blood.  ? You have pain with breathing.  ? You have pain in your chest, arms, shoulders, or belly (abdomen).  ? You have a fever.  ? You cannot walk up stairs.  ? You cannot exercise the way you normally do.  This information is not intended to replace advice given to you by your health care provider. Make sure you discuss any questions you have with your health care provider.  Document Released: 12/29/2007 Document Revised: 07/29/2016 Document Reviewed: 07/29/2016  Elsevier Interactive Patient Education ? 2017 [employer]

## 2016-12-02 NOTE — Progress Notes (Signed)
Pt here for an acute care OV today   Impression and Recommendations:    1. Cough   2. SOB (shortness of breath)   3. Environmental and seasonal allergies   4. Postnasal discharge   5. Wheeze    - cxr referral sent - albuterol prn; used in past and actually sent to pulm many yrs ago and all w/up Neg - pt declines ekg--> not exertional at all and still exercising w/o any sx.   If any dev--- she will elt [place] know immediately - Follow-up sooner than planned if symptoms do not continue to improve or you have additional concerns/questions.  No problem-specific Assessment & Plan notes found for this encounter.   The patient was counseled, risk factors were discussed, anticipatory guidance given.    Meds ordered this encounter  Medications  . albuterol (PROVENTIL HFA;VENTOLIN HFA) 108 (90 Base) MCG/ACT inhaler    Sig: Inhale 1-2 puffs into the lungs every 4 (four) hours as needed for wheezing or shortness of breath (only short term treatment).    Dispense:  1 Inhaler    Refill:  1     Discontinued Medications   No medications on file      Orders Placed This Encounter  Procedures  . DG Chest 2 View     Gross side effects, risk and benefits, and alternatives of medications and treatment plan in general discussed with patient.  Patient is aware that all medications have potential side effects and we are unable to predict every side effect or drug-drug interaction that may occur.   Patient will call with any questions prior to using medication if they have concerns.  Expresses verbal understanding and consents to current therapy and treatment regimen.  No barriers to understanding were identified.  Red flag symptoms and signs discussed in detail.  Patient expressed understanding regarding what to do in case of emergency\urgent symptoms  Please see AVS handed out to patient at the end of our visit for further patient instructions/ counseling done pertaining to today's office  visit.   Return if symptoms worsen or fail to improve.     Note: This document was prepared using Dragon voice recognition software and may include unintentional dictation errors.  [name] 10:47 AM --------------------------------------------------------------------------------------------------------------------------------------------------------------------------------------------------------------------------------------------    Subjective:    CC:  Chief Complaint  Patient presents with  . Breathing Problem    Pt stated is coughing, chest tightnest, breathing problem 1 month    HPI: Leslie Dixon is a 71 y.o. female who presents to [place] at [facility] today for issues as discussed below.   recetnly seen for ETD--> prednisone tol well, sx better with meclizine and exercises- maybe does them once daily.  Has f/up with ENT sx in about 1 week.   - migraines--> figured out what her trigger is--> aspertame she was eating.    Now C/O-->  One mo of cough- dry esp at night, but during datytime some and tightness in her chest.  No raditation of tightness.  + wheezing.    If she takes a deep breath=---> makes her cough.   + Only occ SOB.    Still doing stationary bike --->  1-1.5 miles everyday--> does not make chest tightness worse but feels her breathing is not as easy as usual.  Pt denies "chest Pains" while working out / exercising/ wlaking etc.    Pt is just worried it might be something in her chest/ worried about bronchitis.  Used inhaler in the past --> 1-2 yrs ago and felt similar sx--> inhaler helped.     Wt Readings from Last 3 Encounters:  12/02/16 140 lb (63.5 kg)  09/21/16 143 lb 6.4 oz (65 kg)  07/29/16 144 lb 11.2 oz (65.6 kg)   BP Readings from Last 3 Encounters:  12/02/16 116/64  11/19/16 (!) 100/55  09/21/16 119/75   BMI Readings from Last 3 Encounters:  12/02/16 22.60 kg/m  09/21/16 23.15 kg/m  07/29/16 23.36 kg/m      Patient Care Team    Relationship Specialty Notifications Start End  [name], DO PCP - General Family Medicine  02/20/16   Heath Lark, MD Consulting Physician Hematology and Oncology  01/02/15   Suella Broad, MD Consulting Physician Physical Medicine and Rehabilitation  04/20/16    Comment: lumbar injections  Myrlene Broker, MD Attending Physician Urology  04/20/16   Tyler Pita, MD Referring Physician Physical Medicine and Rehabilitation  04/20/16    Comment: Pain medicine  Katy Apo, MD Consulting Physician Ophthalmology  04/20/16    Comment: uveitis  Juanda Chance, NP Nurse Practitioner Obstetrics and Gynecology  04/20/16   Druscilla Brownie, MD Consulting Physician Dermatology  04/20/16    Comment: Lennie Odor, NP     Patient Active Problem List   Diagnosis Date Noted  . Chronic vertigo 09/21/2016    Priority: High  . h/o GAD (had panic in past) 02/12/2016    Priority: High  . Fibromyalgia 03/07/2007    Priority: High  . GERD (gastroesophageal reflux disease)     Priority: Medium  . Osteoporosis     Priority: Medium  . Chronic back pain     Priority: Medium  . LACTOSE INTOLERANCE 03/07/2007    Priority: Medium  . OA (osteoarthritis) 03/07/2007    Priority: Medium  . ETD (Eustachian tube dysfunction), bilateral  ( R >er L ) 11/19/2016    Priority: Low  . 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 Insomnia 07/29/2016    Priority: Low  . History of non anemic vitamin B12 deficiency 05/02/2016    Priority: Low  . Environmental and seasonal allergies 04/20/2016    Priority: Low  . Idiopathic benign Leukopenia     Priority: Low  . Ocular migraine 03/07/2007    Priority: Low  . Right thyroid nodule 05/28/2016  . Intolerance to cold 05/02/2016  . Encounter for wellness examination 04/20/2016  . Can't get food down 04/10/2012  . Difficulty speaking 04/10/2012  . Hydronephrosis 03/22/2012  . Back pain    . Chronic infection of sinus 07/09/2011  . Blood in the urine 05/06/2011  . 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 Calculus of kidney 05/06/2011  . HYPOKALEMIA 01/03/2008  . Allergic rhinitis due to pollen 12/13/2007  . 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 CARPAL TUNNEL SYNDROME 03/07/2007  . VARICOSE VEIN 03/07/2007  . NEPHROLITHIASIS, HX OF 03/07/2007    Past Medical history, Surgical history, Family history, Social history, Allergies and Medications have been entered into the medical record, reviewed and changed as needed.    Current Meds  Medication Sig  . amitriptyline (ELAVIL) 25 MG tablet Take 1 tablet (25 mg total) by mouth at bedtime.  . Cholecalciferol (VITAMIN D3) 5000 units TABS 5,000 IU OTC vitamin D3 daily.  . Ginger Root POWD 50 mg by Does not apply route daily at 8 pm.  . ibandronate (BONIVA) 150 MG tablet Take 1 tablet (  150 mg total) by mouth every 30 (thirty) days.  Marland Kitchen loratadine (CLARITIN) 10 MG tablet Take by mouth.  . meclizine (ANTIVERT) 25 MG tablet Take 1 tablet (25 mg total) by mouth 3 (three) times daily as needed for dizziness.  . meloxicam (MOBIC) 7.5 MG tablet Take 1 tablet (7.5 mg total) by mouth daily.  . traMADol (ULTRAM) 50 MG tablet Take 1 tablet (50 mg total) by mouth every 6 (six) hours as needed for moderate pain.  Marland Kitchen triamcinolone (NASACORT ALLERGY 24HR) 55 MCG/ACT AERO nasal inhaler Place 1 spray into the nose daily.  . Turmeric 500 MG CAPS Take 2 capsules by mouth daily at 8 pm.  . vitamin B-12 (CYANOCOBALAMIN) 1000 MCG tablet Take 1,000 mcg by mouth daily.  . vitamin C (ASCORBIC ACID) 500 MG tablet Take 500 mg by mouth daily.  . vitamin E 400 UNIT capsule Take 400 Units by mouth daily.  . VOLTAREN 1 % GEL Apply 1 application topically as needed.    Allergies:  Allergies  Allergen Reactions  . Peanut Oil Anaphylaxis  . Clarithromycin     REACTION: GI  . Codeine     REACTION: nausea and vomiting  . Lactose     Other reaction(s): GI Upset  (intolerance)  . 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 Penicillins     REACTION: per allergy testing/Patient states she has taken Augmentin without complications.   . Pregabalin     REACTION: headache, several side effects  . Sulfonamide Derivatives     REACTION: Hives, wheezing  . Cefdinir Rash     Review of Systems: General:   Denies fever, chills, unexplained weight loss.  Optho/Auditory:   Denies visual changes, blurred vision/LOV Respiratory:   Denies wheeze, DOE more than baseline levels.  Cardiovascular:   Denies chest pain, palpitations, new onset peripheral edema  Gastrointestinal:   Denies nausea, vomiting, diarrhea, abd pain.  Genitourinary: Denies dysuria, freq/ urgency, flank pain or discharge from genitals.  Endocrine:     Denies hot or cold intolerance, polyuria, polydipsia. Musculoskeletal:   Denies unexplained myalgias, joint swelling, unexplained arthralgias, gait problems.  Skin:  Denies new onset rash, suspicious lesions Neurological:     Denies dizziness, unexplained weakness, numbness  Psychiatric/Behavioral:   Denies mood changes, suicidal or homicidal ideations, hallucinations    Objective:   Blood pressure 116/64, pulse 70, temperature 98.5 F (36.9 C), resp. rate (!) 70, weight 140 lb (63.5 kg). Body mass index is 22.6 kg/m. General:  Well Developed, well nourished, appropriate for stated age.  Neuro:  Alert and oriented,  extra-ocular muscles intact  HEENT:  Normocephalic, atraumatic, neck supple Skin:  no gross rash, warm, pink. Cardiac:  RRR, S1 S2 Respiratory:  ECTA B/L and A/P, Not using accessory muscles, speaking in full sentences- unlabored. Vascular:  Ext warm, no cyanosis apprec.; cap RF less 2 sec. Psych:  No HI/SI, judgement and insight good, Euthymic mood. Full Affect.

## 2016-12-03 ENCOUNTER — Ambulatory Visit
Admission: RE | Admit: 2016-12-03 | Discharge: 2016-12-03 | Disposition: A | Discharge status: Home / Self Care | Payer: 59 | Source: Ambulatory Visit | Attending: Family Medicine | Admitting: Family Medicine

## 2016-12-03 DIAGNOSIS — R059 Cough, unspecified: Secondary | ICD-10-CM

## 2016-12-03 DIAGNOSIS — R05 Cough: Secondary | ICD-10-CM | POA: Diagnosis not present

## 2016-12-03 DIAGNOSIS — R0602 Shortness of breath: Secondary | ICD-10-CM

## 2016-12-03 DIAGNOSIS — R062 Wheezing: Secondary | ICD-10-CM

## 2016-12-06 ENCOUNTER — Telehealth: Payer: Self-pay | Admitting: Family Medicine

## 2016-12-06 NOTE — Telephone Encounter (Signed)
PT called wants to know results of Xrays from 12/03/16.Marland Kitchen Please call here at home phone number. --glh

## 2016-12-06 NOTE — Telephone Encounter (Signed)
Pt informed of results.  Pt states that she feels the inhaler is helping.  Charyl Bigger, CMA

## 2016-12-09 DIAGNOSIS — H6981 Other specified disorders of Eustachian tube, right ear: Secondary | ICD-10-CM | POA: Diagnosis not present

## 2016-12-09 DIAGNOSIS — H903 Sensorineural hearing loss, bilateral: Secondary | ICD-10-CM | POA: Diagnosis not present

## 2016-12-17 DIAGNOSIS — M542 Cervicalgia: Secondary | ICD-10-CM | POA: Diagnosis not present

## 2016-12-29 ENCOUNTER — Other Ambulatory Visit: Payer: Self-pay | Admitting: Family Medicine

## 2016-12-29 NOTE — Telephone Encounter (Signed)
Meloxicam refill request.  Not filled since 02/2016.  Please advise.

## 2017-01-11 DIAGNOSIS — H04123 Dry eye syndrome of bilateral lacrimal glands: Secondary | ICD-10-CM | POA: Diagnosis not present

## 2017-01-13 DIAGNOSIS — G8929 Other chronic pain: Secondary | ICD-10-CM | POA: Diagnosis not present

## 2017-01-13 DIAGNOSIS — M25521 Pain in right elbow: Secondary | ICD-10-CM | POA: Diagnosis not present

## 2017-01-17 DIAGNOSIS — Z6822 Body mass index (BMI) 22.0-22.9, adult: Secondary | ICD-10-CM | POA: Diagnosis not present

## 2017-01-17 DIAGNOSIS — Z01419 Encounter for gynecological examination (general) (routine) without abnormal findings: Secondary | ICD-10-CM | POA: Diagnosis not present

## 2017-01-27 ENCOUNTER — Other Ambulatory Visit: Payer: Self-pay | Admitting: Family Medicine

## 2017-01-27 DIAGNOSIS — Z1231 Encounter for screening mammogram for malignant neoplasm of breast: Secondary | ICD-10-CM

## 2017-02-03 DIAGNOSIS — M67321 Transient synovitis, right elbow: Secondary | ICD-10-CM | POA: Diagnosis not present

## 2017-02-08 ENCOUNTER — Ambulatory Visit (INDEPENDENT_AMBULATORY_CARE_PROVIDER_SITE_OTHER): Payer: Medicare Other | Admitting: Family Medicine

## 2017-02-08 ENCOUNTER — Encounter: Payer: Self-pay | Admitting: Family Medicine

## 2017-02-08 VITALS — BP 104/63 | HR 67 | Temp 98.2°F | Ht 66.0 in | Wt 139.0 lb

## 2017-02-08 DIAGNOSIS — H6983 Other specified disorders of Eustachian tube, bilateral: Secondary | ICD-10-CM

## 2017-02-08 DIAGNOSIS — R0982 Postnasal drip: Secondary | ICD-10-CM

## 2017-02-08 DIAGNOSIS — J320 Chronic maxillary sinusitis: Secondary | ICD-10-CM

## 2017-02-08 DIAGNOSIS — J3089 Other allergic rhinitis: Secondary | ICD-10-CM

## 2017-02-08 NOTE — Progress Notes (Signed)
Pt here for an acute care OV today   Impression and Recommendations:    1. Environmental and seasonal allergies   2. Postnasal discharge   3. ETD (Eustachian tube dysfunction), bilateral ( R.> er L )    4. Maxillary sinusitis, unspecified chronicity     - We will give her Augmentin as this has worked extremely well for patient in the past.  Last antibiotic use was around January. - She would like to hold off on the steroids for now and if halfway through her antibiotic regimen she is not seeing a real improvement, she will then consider coming back for a steroid injection and/or 5 day - Please do not med sinus rinses twice daily.  These are sinus rinses of your entire sinus passages bilaterally. -Discussed with patient it is imperative she follow-up with ear nose and throat and undergo procedures with them to help clear out her nasal passages as well as help with her eustachian tube dysfunction.  No problem-specific Assessment & Plan notes found for this encounter.   The patient was counseled, risk factors were discussed, anticipatory guidance given.  New Prescriptions   No medications on file    Discontinued Medications   MECLIZINE (ANTIVERT) 25 MG TABLET    Take 1 tablet (25 mg total) by mouth 3 (three) times daily as needed for dizziness.   TRAMADOL (ULTRAM) 50 MG TABLET    Take 1 tablet (50 mg total) by mouth every 6 (six) hours as needed for moderate pain.   VOLTAREN 1 % GEL    Apply 1 application topically as needed.     No orders of the defined types were placed in this encounter.    Gross side effects, risk and benefits, and alternatives of medications and treatment plan in general discussed with patient.  Patient is aware that all medications have potential side effects and we are unable to predict every side effect or drug-drug interaction that may occur.   Patient will call with any questions prior to using medication if they have concerns.  Expresses verbal  understanding and consents to current therapy and treatment regimen.  No barriers to understanding were identified.  Red flag symptoms and signs discussed in detail.  Patient expressed understanding regarding what to do in case of emergency\urgent symptoms  Please see AVS handed out to patient at the end of our visit for further patient instructions/ counseling done pertaining to today's office visit.   No Follow-up on file.     Note: This document was prepared using Dragon voice recognition software and may include unintentional dictation errors.  Leslie Dixon 10:03 AM --------------------------------------------------------------------------------------------------------------------------------------------------------------------------------------------------------------------------------------------    Subjective:    CC:  Chief Complaint  Patient presents with  . Sinus Problem    Sinus congestion, right ear pain, and headache x 10 days     HPI: Leslie Dixon is a 71 y.o. female who presents to [place] at [facility] today for issues as discussed below.  Sx started 10 d ago; RN, stuffy nose, pain in face- R side face and side of head for past couple days.  Sneezing a lot.   Not using nasacort at all.   Also ran out of Nasal saline as well.   Patient has had increase in nasal discharge of and thicker than usual.  Greenish yellow in color.  She says that her last antibiotic use was in January.  She uses Augmentin for her sinuses and it works extremely well  and she tolerates it well without side effect.  Hasn't seen her ENT doctor in 2-3 months Dr. Pryor Ochoa at [place].   No problems updated.   Wt Readings from Last 3 Encounters:  02/08/17 139 lb (63 kg)  12/02/16 140 lb (63.5 kg)  09/21/16 143 lb 6.4 oz (65 kg)   BP Readings from Last 3 Encounters:  02/08/17 104/63  12/02/16 116/64  11/19/16 (!) 100/55   BMI Readings from Last 3 Encounters:    02/08/17 22.44 kg/m  12/02/16 22.60 kg/m  09/21/16 23.15 kg/m     Patient Care Team    Relationship Specialty Notifications Start End  [name], DO PCP - General Family Medicine  02/20/16   Heath Lark, MD Consulting Physician Hematology and Oncology  01/02/15   Suella Broad, MD Consulting Physician Physical Medicine and Rehabilitation  04/20/16    Comment: lumbar injections  Myrlene Broker, MD Attending Physician Urology  04/20/16   Tyler Pita, MD Referring Physician Physical Medicine and Rehabilitation  04/20/16    Comment: Pain medicine  Katy Apo, MD Consulting Physician Ophthalmology  04/20/16    Comment: uveitis  Juanda Chance, NP Nurse Practitioner Obstetrics and Gynecology  04/20/16   Druscilla Brownie, MD Consulting Physician Dermatology  04/20/16    Comment: Lennie Odor, NP  Carloyn Manner, MD Referring Physician Otolaryngology  02/08/17    Comment: ENT Doc     Patient Active Problem List   Diagnosis Date Noted  . Chronic vertigo 09/21/2016    Priority: High  . h/o GAD (had panic in past) 02/12/2016    Priority: High  . Fibromyalgia 03/07/2007    Priority: High  . GERD (gastroesophageal reflux disease)     Priority: Medium  . Osteoporosis     Priority: Medium  . Chronic back pain     Priority: Medium  . LACTOSE INTOLERANCE 03/07/2007    Priority: Medium  . OA (osteoarthritis) 03/07/2007    Priority: Medium  . ETD (Eustachian tube dysfunction), bilateral  ( R >er L ) 11/19/2016    Priority: Low  . 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 Insomnia 07/29/2016    Priority: Low  . History of non anemic vitamin B12 deficiency 05/02/2016    Priority: Low  . Environmental and seasonal allergies 04/20/2016    Priority: Low  . Idiopathic benign Leukopenia     Priority: Low  . Ocular migraine 03/07/2007    Priority: Low  . 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 Presbycusis of both ears  09/30/2016  . Right thyroid nodule 05/28/2016  . Intolerance to cold 05/02/2016  . Encounter for wellness examination 04/20/2016  . Can't get food down 04/10/2012  . Difficulty speaking 04/10/2012  . Hydronephrosis 03/22/2012  . Back pain   . Chronic infection of sinus 07/09/2011  . Blood in the urine 05/06/2011  . 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 Calculus of kidney 05/06/2011  . HYPOKALEMIA 01/03/2008  . Allergic rhinitis due to pollen 12/13/2007  . 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 CARPAL TUNNEL SYNDROME 03/07/2007  . VARICOSE VEIN 03/07/2007  . NEPHROLITHIASIS, HX OF 03/07/2007    Past Medical history, Surgical history, Family history, Social history, Allergies and Medications have been entered into the medical record, reviewed and changed as needed.    Current Meds  Medication Sig  . albuterol (PROVENTIL HFA;VENTOLIN HFA) 108 (90 Base) MCG/ACT inhaler Inhale 1-2 puffs into the lungs every  4 (four) hours as needed for wheezing or shortness of breath (only short term treatment).  Marland Kitchen amitriptyline (ELAVIL) 25 MG tablet Take 1 tablet (25 mg total) by mouth at bedtime.  . Cholecalciferol (VITAMIN D3) 5000 units TABS 5,000 IU OTC vitamin D3 daily.  . Ginger Root POWD 50 mg by Does not apply route daily at 8 pm.  . ibandronate (BONIVA) 150 MG tablet Take 1 tablet (150 mg total) by mouth every 30 (thirty) days.  Marland Kitchen loratadine (CLARITIN) 10 MG tablet Take by mouth.  . meloxicam (MOBIC) 7.5 MG tablet TAKE 1 TABLET BY MOUTH  EVERY DAY  . triamcinolone (NASACORT ALLERGY 24HR) 55 MCG/ACT AERO nasal inhaler Place 1 spray into the nose daily.  . Turmeric 500 MG CAPS Take 2 capsules by mouth daily at 8 pm.  . vitamin B-12 (CYANOCOBALAMIN) 1000 MCG tablet Take 1,000 mcg by mouth daily.  . vitamin C (ASCORBIC ACID) 500 MG tablet Take 500 mg by mouth daily.  . vitamin E 400 UNIT capsule Take 400 Units by mouth daily.  . [DISCONTINUED] meclizine (ANTIVERT) 25 MG tablet Take 1 tablet (25 mg total) by  mouth 3 (three) times daily as needed for dizziness.  . [DISCONTINUED] traMADol (ULTRAM) 50 MG tablet Take 1 tablet (50 mg total) by mouth every 6 (six) hours as needed for moderate pain.  . [DISCONTINUED] VOLTAREN 1 % GEL Apply 1 application topically as needed.    Allergies:  Allergies  Allergen Reactions  . Peanut Oil Anaphylaxis  . Clarithromycin     REACTION: GI  . Codeine     REACTION: nausea and vomiting  . Lactose     Other reaction(s): GI Upset (intolerance)  . 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 Penicillins     REACTION: per allergy testing/Patient states she has taken Augmentin without complications.   . Pregabalin     REACTION: headache, several side effects  . Sulfa Antibiotics Other (See Comments)    REACTION: Hives, wheezing  . Sulfonamide Derivatives     REACTION: Hives, wheezing  . Cefdinir Rash     Review of Systems: General:   Denies fever, chills, unexplained weight loss.  Optho/Auditory:   Denies visual changes, blurred vision/LOV Respiratory:   Denies wheeze, DOE more than baseline levels.  Cardiovascular:   Denies chest pain, palpitations, new onset peripheral edema  Gastrointestinal:   Denies nausea, vomiting, diarrhea, abd pain.  Genitourinary: Denies dysuria, freq/ urgency, flank pain or discharge from genitals.  Endocrine:     Denies hot or cold intolerance, polyuria, polydipsia. Musculoskeletal:   Denies unexplained myalgias, joint swelling, unexplained arthralgias, gait problems.  Skin:  Denies new onset rash, suspicious lesions Neurological:     Denies dizziness, unexplained weakness, numbness  Psychiatric/Behavioral:   Denies mood changes, suicidal or homicidal ideations, hallucinations    Objective:   Blood pressure 104/63, pulse 67, temperature 98.2 F (36.8 C), height 5\' 6"  (1.676 m), weight 139 lb (63 kg). Body mass index is 22.44 kg/m. General:  Well Developed, well nourished, appropriate  for stated age.  Neuro:  Alert and oriented,  extra-ocular muscles intact  HEENT:  Normocephalic, atraumatic, neck supple Skin:  no gross rash, warm, pink. Cardiac:  RRR, S1 S2 Respiratory:  ECTA B/L and A/P, Not using accessory muscles, speaking in full sentences- unlabored. Vascular:  Ext warm, no cyanosis apprec.; cap RF less 2 sec. Psych:  No HI/SI, judgement and insight good, Euthymic mood. Full  Affect.

## 2017-02-08 NOTE — Patient Instructions (Addendum)
Use neil med sinus rinses twice daily.    [email or web address]   go on YouTube and search Milta Deiters med to see people using the product as well.  - Please make sure you make a follow-up with her ear nose and throat doctor so we can do with your chronic sinusitis so that you don't get acute infections several times yearly   Sinusitis, Adult Sinusitis is soreness and inflammation of your sinuses. Sinuses are hollow spaces in the bones around your face. Your sinuses are located:  Around your eyes.  In the middle of your forehead.  Behind your nose.  In your cheekbones.  Your sinuses and nasal passages are lined with a stringy fluid (mucus). Mucus normally drains out of your sinuses. When your nasal tissues become inflamed or swollen, the mucus can become trapped or blocked so air cannot flow through your sinuses. This allows bacteria, viruses, and funguses to grow, which leads to infection. Sinusitis can develop quickly and last for 7?10 days (acute) or for more than 12 weeks (chronic). Sinusitis often develops after a cold. What are the causes? This condition is caused by anything that creates swelling in the sinuses or stops mucus from draining, including:  Allergies.  Asthma.  Bacterial or viral infection.  Abnormally shaped bones between the nasal passages.  Nasal growths that contain mucus (nasal polyps).  Narrow sinus openings.  Pollutants, such as chemicals or irritants in the air.  A foreign object stuck in the nose.  A fungal infection. This is rare.  What increases the risk? The following factors may make you more likely to develop this condition:  Having allergies or asthma.  Having had a recent cold or respiratory tract infection.  Having structural deformities or blockages in your nose or sinuses.  Having a weak immune system.  Doing a lot of swimming or diving.  Overusing nasal sprays.  Smoking.  What are  the signs or symptoms? The main symptoms of this condition are pain and a feeling of pressure around the affected sinuses. Other symptoms include:  Upper toothache.  Earache.  Headache.  Bad breath.  Decreased sense of smell and taste.  A cough that may get worse at night.  Fatigue.  Fever.  Thick drainage from your nose. The drainage is often green and it may contain pus (purulent).  Stuffy nose or congestion.  Postnasal drip. This is when extra mucus collects in the throat or back of the nose.  Swelling and warmth over the affected sinuses.  Sore throat.  Sensitivity to light.  How is this diagnosed? This condition is diagnosed based on symptoms, a medical history, and a physical exam. To find out if your condition is acute or chronic, your health care provider may:  Look in your nose for signs of nasal polyps.  Tap over the affected sinus to check for signs of infection.  View the inside of your sinuses using an imaging device that has a light attached (endoscope).  If your health care provider suspects that you have chronic sinusitis, you may also:  Be tested for allergies.  Have a sample of mucus taken from your nose (nasal culture) and checked for bacteria.  Have a mucus sample examined to see if your sinusitis is related to an allergy.  If your sinusitis does not respond to treatment and it lasts longer than 8 weeks, you may have an MRI or CT scan to check your sinuses. These scans also help to determine how severe your infection is. In  rare cases, a bone biopsy may be done to rule out more serious types of fungal sinus disease. How is this treated? Treatment for sinusitis depends on the cause and whether your condition is chronic or acute. If a virus is causing your sinusitis, your symptoms will go away on their own within 10 days. You may be given medicines to relieve your symptoms, including:  Topical nasal decongestants. They shrink swollen nasal  passages and let mucus drain from your sinuses.  Antihistamines. These drugs block inflammation that is triggered by allergies. This can help to ease swelling in your nose and sinuses.  Topical nasal corticosteroids. These are nasal sprays that ease inflammation and swelling in your nose and sinuses.  Nasal saline washes. These rinses can help to get rid of thick mucus in your nose.  If your condition is caused by bacteria, you will be given an antibiotic medicine. If your condition is caused by a fungus, you will be given an antifungal medicine. Surgery may be needed to correct underlying conditions, such as narrow nasal passages. Surgery may also be needed to remove polyps. Follow these instructions at home: Medicines  Take, use, or apply over-the-counter and prescription medicines only as told by your health care provider. These may include nasal sprays.  If you were prescribed an antibiotic medicine, take it as told by your health care provider. Do not stop taking the antibiotic even if you start to feel better. Hydrate and Humidify  Drink enough water to keep your urine clear or pale yellow. Staying hydrated will help to thin your mucus.  Use a cool mist humidifier to keep the humidity level in your home above 50%.  Inhale steam for 10-15 minutes, 3-4 times a day or as told by your health care provider. You can do this in the bathroom while a hot shower is running.  Limit your exposure to cool or dry air. Rest  Rest as much as possible.  Sleep with your head raised (elevated).  Make sure to get enough sleep each night. General instructions  Apply a warm, moist washcloth to your face 3-4 times a day or as told by your health care provider. This will help with discomfort.  Wash your hands often with soap and water to reduce your exposure to viruses and other germs. If soap and water are not available, use hand sanitizer.  Do not smoke. Avoid being around people who are  smoking (secondhand smoke).  Keep all follow-up visits as told by your health care provider. This is important. Contact a health care provider if:  You have a fever.  Your symptoms get worse.  Your symptoms do not improve within 10 days. Get help right away if:  You have a severe headache.  You have persistent vomiting.  You have pain or swelling around your face or eyes.  You have vision problems.  You develop confusion.  Your neck is stiff.  You have trouble breathing. This information is not intended to replace advice given to you by your health care provider. Make sure you discuss any questions you have with your health care provider. Document Released: 07/12/2005 Document Revised: 03/07/2016 Document Reviewed: 05/07/2015 Elsevier Interactive Patient Education  2017 Elsevier Inc.     Sinus Headache A sinus headache happens when your sinuses become clogged or swollen. You may feel pain or pressure in your face, forehead, ears, or upper teeth. Sinus headaches can be mild or severe. Follow these instructions at home:  Take medicines only as  told by your doctor.  If you were given an antibiotic medicine, finish all of it even if you start to feel better.  Use a nose spray if you feel stuffed up (congested).  If told, apply a warm, moist washcloth to your face to help lessen pain. Contact a doctor if:  You get headaches more than one time each week.  Light or sound bothers you.  You have a fever.  You feel sick to your stomach (nauseous) or you throw up (vomit).  Your headaches do not get better with treatment. Get help right away if:  You have trouble seeing.  You suddenly have very bad pain in your face or head.  You start to twitch or shake (seizure).  You are confused.  You have a stiff neck. This information is not intended to replace advice given to you by your health care provider. Make sure you discuss any questions you have with your health  care provider. Document Released: 11/11/2010 Document Revised: 03/07/2016 Document Reviewed: 07/08/2014 Elsevier Interactive Patient Education  [employer].

## 2017-02-09 ENCOUNTER — Other Ambulatory Visit: Payer: Self-pay | Admitting: Family Medicine

## 2017-02-09 MED ORDER — AMOXICILLIN-POT CLAVULANATE 875-125 MG PO TABS: 1.0000 | ORAL_TABLET | Freq: Two times a day (BID) | ORAL | 0 refills | Status: DC

## 2017-02-09 NOTE — Addendum Note (Signed)
Addended by: Amado Coe on: 02/09/2017 11:12 AM   Modules accepted: Orders

## 2017-02-09 NOTE — Telephone Encounter (Signed)
Augmentin rx sent to [facility] per Dr Raliegh Scarlet.  Patient informed.

## 2017-02-09 NOTE — Telephone Encounter (Signed)
Patient was under the impression that we were calling in an antibiotic for her, she called her pharmacy and they said they never received a prescription for her. She uses [facility]. Please advise.

## 2017-02-18 DIAGNOSIS — M25521 Pain in right elbow: Secondary | ICD-10-CM | POA: Diagnosis not present

## 2017-02-18 DIAGNOSIS — G8929 Other chronic pain: Secondary | ICD-10-CM | POA: Diagnosis not present

## 2017-02-18 DIAGNOSIS — M67321 Transient synovitis, right elbow: Secondary | ICD-10-CM | POA: Diagnosis not present

## 2017-02-23 ENCOUNTER — Ambulatory Visit (INDEPENDENT_AMBULATORY_CARE_PROVIDER_SITE_OTHER): Payer: 59 | Admitting: Family Medicine

## 2017-02-23 ENCOUNTER — Encounter: Payer: Self-pay | Admitting: Family Medicine

## 2017-02-23 VITALS — BP 109/58 | HR 74 | Ht 66.0 in | Wt 138.3 lb

## 2017-02-23 DIAGNOSIS — M797 Fibromyalgia: Secondary | ICD-10-CM

## 2017-02-23 DIAGNOSIS — Z803 Family history of malignant neoplasm of breast: Secondary | ICD-10-CM | POA: Insufficient documentation

## 2017-02-23 DIAGNOSIS — J320 Chronic maxillary sinusitis: Secondary | ICD-10-CM | POA: Diagnosis not present

## 2017-02-23 DIAGNOSIS — M159 Polyosteoarthritis, unspecified: Secondary | ICD-10-CM

## 2017-02-23 DIAGNOSIS — J301 Allergic rhinitis due to pollen: Secondary | ICD-10-CM | POA: Diagnosis not present

## 2017-02-23 DIAGNOSIS — M150 Primary generalized (osteo)arthritis: Secondary | ICD-10-CM

## 2017-02-23 DIAGNOSIS — Z8639 Personal history of other endocrine, nutritional and metabolic disease: Secondary | ICD-10-CM | POA: Diagnosis not present

## 2017-02-23 DIAGNOSIS — G4700 Insomnia, unspecified: Secondary | ICD-10-CM

## 2017-02-23 DIAGNOSIS — K219 Gastro-esophageal reflux disease without esophagitis: Secondary | ICD-10-CM | POA: Diagnosis not present

## 2017-02-23 DIAGNOSIS — Z9889 Other specified postprocedural states: Secondary | ICD-10-CM | POA: Insufficient documentation

## 2017-02-23 MED ORDER — MELOXICAM 7.5 MG PO TABS: ORAL_TABLET | ORAL | 1 refills | Status: DC

## 2017-02-23 NOTE — Progress Notes (Signed)
Impression and Recommendations:    1. Primary osteoarthritis involving multiple joints   2. Family history of breast cancer- sister at age 73- new onset   3. Fibromyalgia   4. Gastroesophageal reflux disease, esophagitis presence not specified   5. Insomnia, unspecified type   6. History of thyroid nodule- R side ( Dr Loanne Drilling eval in 10/17)   7. Status post lumbar surgery- foot drop R foot    - Patient with history of fibromyalgia and osteoarthritis-says Mobic works the best of multiple medicines for her.  Refills provided after very long discussion about risks and benefits of this medicine to include cardiovascular risk factors, gastrointestinal risk factor, and others were discussed  Etc.  patient desires refill and feels her quality of life is more important than the potential side effects of the medication. - She will continue with her multiple orthopedic physicians to help specific joint relief with injections and other methods. - Not needing her Prilosec at all lately since her reflux and stomach problems have been under great control.  Did advise she take Mobic after eating and add a Prilosec to her regimen if she begins with any stomach upset - Did review with patient Dr. Cordelia Pen findings from when he last saw patient in October.  Ultrasound was very reassuring and the endocrinologist did not feel there was anything to worry about.  She was told she could follow up just as needed on a yearly basis if she desired so.  I reviewed these results with patient today and she felt very reassured by [place] reviewing his office visit notes and the ultrasound report. - Patient has been sleeping well with the use of Mobic and has not needed to take her Elavil at all.  She understands it may have some slight compounding effects with the Mobic and will continue to hold off unless her sleep habits degrade. - Review of patient's labs show that she had a full set of fasting labs back in July 2017.  At  her follow-up regular office visit in September she will come fasting and we will obtain these again.  - Pt was in the office today for 40+ minutes, with over 50% time spent in face to face counseling of patients various medical conditions, treatment plans of those medical conditions including medicine management and lifestyle modification, strategies to improve health and well being; and in coordination of care. SEE ABOVE FOR DETAILS   The patient was counseled, risk factors were discussed, anticipatory guidance given.   Modified Medications   Modified Medication Previous Medication   MELOXICAM (MOBIC) 7.5 MG TABLET meloxicam (MOBIC) 7.5 MG tablet      Take 1-2 tablets daily for joint pains    TAKE 1 TABLET BY MOUTH  EVERY DAY   Gross side effects, risk and benefits, and alternatives of medications and treatment plan in general discussed with patient.  Patient is aware that all medications have potential side effects and we are unable to predict every side effect or drug-drug interaction that may occur.   Patient will call with any questions prior to using medication if they have concerns.  Expresses verbal understanding and consents to current therapy and treatment regimen.  No barriers to understanding were identified.  Red flag symptoms and signs discussed in detail.  Patient expressed understanding regarding what to do in case of emergency\urgent symptoms  Please see AVS handed out to patient at the end of our visit for further patient instructions/ counseling done  pertaining to today's office visit.   Return in about 3 months (around 05/26/2017) for F-up of current med issues as previously d/c pt.     Note: This document was prepared using Dragon voice recognition software and may include unintentional dictation errors.  [name] 10:38  AM --------------------------------------------------------------------------------------------------------------------------------------------------------------------------------------------------------------------------------------------    Subjective:    CC:  Chief Complaint  Patient presents with  . Follow-up    HPI: Leslie Dixon is a 71 y.o. female who presents to [facility] today for issues as discussed below.  Here to discuss mobic and needs RF's.    A lot of ortho problems and back/ neck problems ( h/o 2 cervical fusions in '96 and 2010)   Dr Claudia Desanctis- last MRI in 09/2014- see's her [facility] ylry at [facility].    R foot- saw Doctor Delilah Shan at [facility] ortho.   Saw Dr Lorre Nick then Ivette Loyal for R elbow pain- h/o old injury many yrs ago.  MRI done at [facility] ortho- saw tear in tendon and tendinitis.   Told to take Mobic and ice it. Doesn't want narcotics pain pills.   Has reck in 6 wks---> with him for possible injection in elbow.  R hip pain- see's Dr Elmyra Ricks for hip injections  Dr Nelva Bush- does back injections for her.   When she takes mobic---> helps a lot with her pain in mutliple jts.   Does "so good on it".   Tried gabapentin, refused lyrica, cymbalta etc.   Takes mobic with food, no h/o stomach problems.  No h/o gastric ulcer.   She does take prilosec prn if she eats the wrong thing and has gerd sx.   ---->  Pt not using elavil, b/c mobic working so well for her    Problem  Family history of breast cancer- sister at age 29- new onset  History of thyroid nodule- R side ( Dr Loanne Drilling eval in 10/17)   [place]: Solitary right thyroid nodule measuring 0.8 cm. This is a spongiform nodule and does not meet criteria for biopsy or dedicated follow-up.   Status post lumbar surgery- foot drop R foot     Wt Readings from Last 3 Encounters:  02/23/17 138 lb 4.8 oz (62.7 kg)  02/08/17 139 lb (63 kg)  12/02/16 140 lb (63.5 kg)   BP Readings from Last 3 Encounters:   02/23/17 (!) 109/58  02/08/17 104/63  12/02/16 116/64   Pulse Readings from Last 3 Encounters:  02/23/17 74  02/08/17 67  12/02/16 70   BMI Readings from Last 3 Encounters:  02/23/17 22.32 kg/m  02/08/17 22.44 kg/m  12/02/16 22.60 kg/m     Patient Care Team    Relationship Specialty Notifications Start End  [name], DO PCP - General Family Medicine  02/20/16   Heath Lark, MD Consulting Physician Hematology and Oncology  01/02/15   Suella Broad, MD Consulting Physician Physical Medicine and Rehabilitation  04/20/16    Comment: lumbar injections  Myrlene Broker, MD Attending Physician Urology  04/20/16   Tyler Pita, MD Referring Physician Physical Medicine and Rehabilitation  04/20/16    Comment: Pain medicine  Katy Apo, MD Consulting Physician Ophthalmology  04/20/16    Comment: uveitis  Juanda Chance, NP Nurse Practitioner Obstetrics and Gynecology  04/20/16   Druscilla Brownie, MD Consulting Physician Dermatology  04/20/16    Comment: Lennie Odor, NP  Carloyn Manner, MD Referring Physician Otolaryngology  02/08/17    Comment: ENT Doc  Renato Shin,  MD Consulting Physician Endocrinology  02/23/17      Patient Active Problem List   Diagnosis Date Noted  . Family history of breast cancer- sister at age 73- new onset 02/23/2017    Priority: High  . Chronic vertigo 09/21/2016    Priority: High  . h/o GAD (had panic in past) 02/12/2016    Priority: High  . Fibromyalgia 03/07/2007    Priority: High  . GERD (gastroesophageal reflux disease)     Priority: Medium  . Osteoporosis     Priority: Medium  . Chronic back pain     Priority: Medium  . LACTOSE INTOLERANCE 03/07/2007    Priority: Medium  . OA (osteoarthritis) 03/07/2007    Priority: Medium  . History of thyroid nodule- R side ( Dr Loanne Drilling eval in 10/17) 02/23/2017    Priority: Low  . ETD (Eustachian tube dysfunction), bilateral  ( R >er L ) 11/19/2016    Priority: Low  . 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 Insomnia 07/29/2016    Priority: Low  . History of non anemic vitamin B12 deficiency 05/02/2016    Priority: Low  . Environmental and seasonal allergies 04/20/2016    Priority: Low  . Idiopathic benign Leukopenia     Priority: Low  . Ocular migraine 03/07/2007    Priority: Low  . Status post lumbar surgery- foot drop R foot 02/23/2017  . 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 Presbycusis of both ears 09/30/2016  . Right thyroid nodule 05/28/2016  . Intolerance to cold 05/02/2016  . Encounter for wellness examination 04/20/2016  . Can't get food down 04/10/2012  . Difficulty speaking 04/10/2012  . Hydronephrosis 03/22/2012  . Back pain   . Chronic infection of sinus 07/09/2011  . Blood in the urine 05/06/2011  . 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 Calculus of kidney 05/06/2011  . HYPOKALEMIA 01/03/2008  . Allergic rhinitis due to pollen 12/13/2007  . 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 CARPAL TUNNEL SYNDROME 03/07/2007  . VARICOSE VEIN 03/07/2007  . NEPHROLITHIASIS, HX OF 03/07/2007    Past Medical history, Surgical history, Family history, Social history, Allergies and Medications have been entered into the medical record, reviewed and changed as needed.    Current Meds  Medication Sig  . Cholecalciferol (VITAMIN D3) 5000 units TABS 5,000 IU OTC vitamin D3 daily.  . Ginger Root POWD 50 mg by Does not apply route daily at 8 pm.  . ibandronate (BONIVA) 150 MG tablet Take 1 tablet (150 mg total) by mouth every 30 (thirty) days.  Marland Kitchen loratadine (CLARITIN) 10 MG tablet Take by mouth.  . meloxicam (MOBIC) 7.5 MG tablet Take 1-2 tablets daily for joint pains  . triamcinolone (NASACORT ALLERGY 24HR) 55 MCG/ACT AERO nasal inhaler Place 1 spray into the nose daily.  . Turmeric 500 MG CAPS Take 2 capsules by mouth daily at 8 pm.  . vitamin B-12 (CYANOCOBALAMIN) 1000 MCG tablet Take 1,000 mcg by mouth daily.  .  vitamin C (ASCORBIC ACID) 500 MG tablet Take 500 mg by mouth daily.  . vitamin E 400 UNIT capsule Take 400 Units by mouth daily.  . [DISCONTINUED] meloxicam (MOBIC) 7.5 MG tablet TAKE 1 TABLET BY MOUTH  EVERY DAY    Allergies:  Allergies  Allergen Reactions  . Peanut Oil Anaphylaxis  . Clarithromycin     REACTION: GI  . Codeine     REACTION: nausea and vomiting  .  Lactose     Other reaction(s): GI Upset (intolerance)  . 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 Penicillins     REACTION: per allergy testing/Patient states she has taken Augmentin without complications.   . Pregabalin     REACTION: headache, several side effects  . Sulfa Antibiotics Other (See Comments)    REACTION: Hives, wheezing  . Sulfonamide Derivatives     REACTION: Hives, wheezing  . Cefdinir Rash     Review of Systems: General:   Denies fever, chills, unexplained weight loss.  Optho/Auditory:   Denies visual changes, blurred vision/LOV Respiratory:   Denies wheeze, DOE more than baseline levels.  Cardiovascular:   Denies chest pain, palpitations, new onset peripheral edema  Gastrointestinal:   Denies nausea, vomiting, diarrhea, abd pain.  Genitourinary: Denies dysuria, freq/ urgency, flank pain or discharge from genitals.  Endocrine:     Denies hot or cold intolerance, polyuria, polydipsia. Musculoskeletal:   Denies unexplained myalgias, new joint swelling, unexplained arthralgias, or new gait problems.  Skin:  Denies new onset rash, suspicious lesions Neurological:     Denies dizziness, unexplained weakness, numbness  Psychiatric/Behavioral:   Denies mood changes, suicidal or homicidal ideations, hallucinations    Objective:   Blood pressure (!) 109/58, pulse 74, height 5\' 6"  (1.676 m), weight 138 lb 4.8 oz (62.7 kg). Body mass index is 22.32 kg/m. General:  Well Developed, well nourished, appropriate for stated age.  Neuro:  Alert and oriented,  extra-ocular  muscles intact  HEENT:  Normocephalic, atraumatic, neck supple, no carotid bruits appreciated  Skin:  no gross rash, warm, pink. Cardiac:  RRR, S1 S2 Respiratory:  ECTA B/L and A/P, Not using accessory muscles, speaking in full sentences- unlabored. Vascular:  Ext warm, no cyanosis apprec.; cap RF less 2 sec. Psych:  No HI/SI, judgement and insight good, Euthymic mood. Full Affect.

## 2017-02-23 NOTE — Patient Instructions (Addendum)
Please let us know if you're having any stomach problems on the meloxicam.  Take 1 daily or 2 if pain is bad.  You can consider taking the Prilosec every day along with it to help protect her stomach.   Dr Renato Shin of endocrinology that we sent you to back in October 2017.     Diclofenac; Misoprostol tablets What is this medicine? DICLOFENAC; MISOPROSTOL (dye KLOE fen ak; mye soe PROST ole) is a combination of two different drugs. Diclofenac is a non-steroidal anti-inflammatory drug (NSAID). It is used to reduce swelling and to treat pain. Misoprostol protects the stomach against ulcers. This medicine is used for arthritis in people who are at high risk for stomach ulcers from NSAID treatment. This medicine may be used for other purposes; ask your health care provider or pharmacist if you have questions. COMMON BRAND NAME(S): Arthrotec What should I tell my health care provider before I take this medicine? They need to know if you have any of these conditions: -asthma, especially aspirin sensitive asthma -coronary artery bypass graft (CABG) surgery within the past 2 weeks -drink more than 3 alcohol containing drinks a day -heart disease or circulation problems like heart failure or leg edema (fluid retention) -high blood pressure -kidney disease -liver disease -stomach bleeding or ulcers -an unusual or allergic reaction to diclofenac, aspirin, other NSAIDs, misoprostol, other medicines, foods, dyes, or preservatives -pregnant or trying to get pregnant -breast-feeding How should I use this medicine? Take this medicine by mouth with a full glass of water. Follow the directions on the prescription label. Do not cut, crush or chew this medicine. You can take it with or without food. If it upsets your stomach, take it with food. Take your medicine at regular intervals. Do not take your medicine more often than directed. Long-term, continuous use may increase the risk of heart attack or  stroke. A special MedGuide will be given to you by the pharmacist with each prescription and refill. Be sure to read this information carefully each time. Talk to your pediatrician regarding the use of this medicine in children. Special care may be needed. Elderly patients over 74 years old may have a stronger reaction and need a smaller dose. Overdosage: If you think you have taken too much of this medicine contact a poison control center or emergency room at once. NOTE: This medicine is only for you. Do not share this medicine with others. What if I miss a dose? If you miss a dose, take it as soon as you can. If it is almost time for your next dose, take only that dose. Do not take double or extra doses. What may interact with this medicine? Do not take this medicine with any of the following medications: -cidofovir -ketorolac -methotrexate -pemetrexed This medicine may also interact with the following medications: -antacids -aspirin and aspirin like medicines -cyclosporine -digoxin -diuretics -lithium -medicines for diabetes -medicines for high blood pressure -medicines that treat or prevent blood clots such as warfarin and other blood thinners -phenobarbital This list may not describe all possible interactions. Give your health care provider a list of all the medicines, herbs, non-prescription drugs, or dietary supplements you use. Also tell them if you smoke, drink alcohol, or use illegal drugs. Some items may interact with your medicine. What should I watch for while using this medicine? Tell your doctor or health care professional if your pain does not get better. Talk to your doctor before taking another medicine for pain. Do not treat  yourself. This medicine does not prevent heart attack or stroke. In fact, this medicine may increase the chance of a heart attack or stroke. The chance may increase with longer use of this medicine and in people who have heart disease. If you take  aspirin to prevent heart attack or stroke, talk with your doctor or health care professional. Do not take medicines such as ibuprofen and naproxen with this medicine. Side effects such as stomach upset, nausea, or ulcers may be more likely to occur. Many medicines available without a prescription should not be taken with this medicine. This medicine can cause ulcers and bleeding in the stomach and intestines at any time during treatment. Do not smoke cigarettes or drink alcohol. These increase irritation to your stomach and can make it more susceptible to damage from this medicine. Ulcers and bleeding can happen without warning symptoms and can cause death. You may get drowsy or dizzy. Do not drive, use machinery, or do anything that needs mental alertness until you know how this medicine affects you. Do not stand or sit up quickly, especially if you are an older patient. This reduces the risk of dizzy or fainting spells. This medicine can cause you to bleed more easily. Try to avoid damage to your teeth and gums when you brush or floss your teeth. If you are female, do not use this medicine if you are pregnant. Do not get pregnant while taking this medicine and for at least one month (one full menstrual cycle) after stopping this medicine. If you can have children, use a reliable form of birth control while taking this medicine. Talk to your doctor about birth control options. If you become pregnant, think you are pregnant, or want to become pregnant, immediately call your doctor for advice. What side effects may I notice from receiving this medicine? Side effects that you should report to your doctor or health care professional as soon as possible: -allergic reactions like skin rash, itching or hives, swelling of the face, lips, or tongue -black or bloody stools, blood in the urine or vomit -blurred vision -chest pain -difficulty breathing or wheezing -nausea or vomiting -slurred speech or weakness  on one side of the body -unexplained weight gain or swelling -unusually weak or tired -yellowing of eyes or skin Side effects that usually do not require medical attention (report to your doctor or health care professional if they continue or are bothersome): -diarrhea -dizziness -gas or heartburn -headache -menstrual irregularity or cramps -stomach pain or cramps This list may not describe all possible side effects. Call your doctor for medical advice about side effects. You may report side effects to FDA at 1-800-FDA-1088. Where should I keep my medicine? Keep out of the reach of children. Store at or below 25 degrees C (77 degrees F). Protect from moisture. Keep container tightly closed. Throw away any unused medicine after the expiration date. NOTE: This sheet is a summary. It may not cover all possible information. If you have questions about this medicine, talk to your doctor, pharmacist, or health care provider.  2018 [employer]vier/Gold Standard (2012-11-07 09:24:49)

## 2017-03-04 ENCOUNTER — Ambulatory Visit
Admission: RE | Admit: 2017-03-04 | Discharge: 2017-03-04 | Disposition: A | Discharge status: Home / Self Care | Payer: 59 | Source: Ambulatory Visit | Attending: Family Medicine | Admitting: Family Medicine

## 2017-03-04 DIAGNOSIS — Z1231 Encounter for screening mammogram for malignant neoplasm of breast: Secondary | ICD-10-CM

## 2017-04-01 DIAGNOSIS — M67321 Transient synovitis, right elbow: Secondary | ICD-10-CM | POA: Diagnosis not present

## 2017-04-11 ENCOUNTER — Other Ambulatory Visit: Payer: Self-pay | Admitting: Family Medicine

## 2017-04-11 DIAGNOSIS — J301 Allergic rhinitis due to pollen: Secondary | ICD-10-CM | POA: Diagnosis not present

## 2017-04-13 DIAGNOSIS — Q631 Lobulated, fused and horseshoe kidney: Secondary | ICD-10-CM | POA: Diagnosis not present

## 2017-04-13 DIAGNOSIS — N1330 Unspecified hydronephrosis: Secondary | ICD-10-CM | POA: Diagnosis not present

## 2017-04-13 DIAGNOSIS — R490 Dysphonia: Secondary | ICD-10-CM | POA: Diagnosis not present

## 2017-04-13 DIAGNOSIS — R319 Hematuria, unspecified: Secondary | ICD-10-CM | POA: Diagnosis not present

## 2017-04-13 DIAGNOSIS — N1339 Other hydronephrosis: Secondary | ICD-10-CM | POA: Diagnosis not present

## 2017-04-13 DIAGNOSIS — Z885 Allergy status to narcotic agent status: Secondary | ICD-10-CM | POA: Diagnosis not present

## 2017-04-13 DIAGNOSIS — Z881 Allergy status to other antibiotic agents status: Secondary | ICD-10-CM | POA: Diagnosis not present

## 2017-04-13 DIAGNOSIS — Z880 Allergy status to penicillin: Secondary | ICD-10-CM | POA: Diagnosis not present

## 2017-04-13 DIAGNOSIS — H6993 Unspecified Eustachian tube disorder, bilateral: Secondary | ICD-10-CM | POA: Diagnosis not present

## 2017-04-13 DIAGNOSIS — N200 Calculus of kidney: Secondary | ICD-10-CM | POA: Diagnosis not present

## 2017-04-13 DIAGNOSIS — H9071 Mixed conductive and sensorineural hearing loss, unilateral, right ear, with unrestricted hearing on the contralateral side: Secondary | ICD-10-CM | POA: Diagnosis not present

## 2017-04-13 DIAGNOSIS — Z888 Allergy status to other drugs, medicaments and biological substances status: Secondary | ICD-10-CM | POA: Diagnosis not present

## 2017-04-13 DIAGNOSIS — R1310 Dysphagia, unspecified: Secondary | ICD-10-CM | POA: Diagnosis not present

## 2017-04-13 DIAGNOSIS — J329 Chronic sinusitis, unspecified: Secondary | ICD-10-CM | POA: Diagnosis not present

## 2017-04-13 DIAGNOSIS — Z79899 Other long term (current) drug therapy: Secondary | ICD-10-CM | POA: Diagnosis not present

## 2017-04-22 ENCOUNTER — Encounter: Payer: Self-pay | Admitting: Family Medicine

## 2017-04-22 ENCOUNTER — Ambulatory Visit (INDEPENDENT_AMBULATORY_CARE_PROVIDER_SITE_OTHER): Payer: Medicare Other | Admitting: Family Medicine

## 2017-04-22 VITALS — BP 128/74 | HR 80 | Ht 66.0 in | Wt 140.7 lb

## 2017-04-22 DIAGNOSIS — Z803 Family history of malignant neoplasm of breast: Secondary | ICD-10-CM

## 2017-04-22 DIAGNOSIS — Z8639 Personal history of other endocrine, nutritional and metabolic disease: Secondary | ICD-10-CM

## 2017-04-22 DIAGNOSIS — Z1389 Encounter for screening for other disorder: Secondary | ICD-10-CM

## 2017-04-22 DIAGNOSIS — M810 Age-related osteoporosis without current pathological fracture: Secondary | ICD-10-CM

## 2017-04-22 DIAGNOSIS — R42 Dizziness and giddiness: Secondary | ICD-10-CM

## 2017-04-22 DIAGNOSIS — Z0000 Encounter for general adult medical examination without abnormal findings: Secondary | ICD-10-CM

## 2017-04-22 NOTE — Progress Notes (Signed)
Impression and Recommendations:    1. Encounter for wellness examination   2. Screening for multiple conditions   3. Age related osteoporosis, unspecified pathological fracture presence   4. History of non anemic vitamin B12 deficiency   5. History of thyroid nodule- R side ( Dr Loanne Drilling eval in 10/17)   6. Chronic vertigo   7. Family history of breast cancer- sister at age 73- new onset     Please see orders section below for further details of actions taken during this office visit.  1) Anticipatory Guidance: Discussed importance of wearing a seatbelt while driving, not texting while driving; sunscreen when outside along with yearly skin surveillance; eating a well balanced and modest diet; physical activity at least 25 minutes per day or 150 min/ week of moderate to intense activity.  2) Immunizations / Screenings / Labs:  All immunizations and screenings that patient agrees to, are up-to-date per recommendations or will be updated today.  Patient understands the needs for q 47mo dental and yearly vision screens which pt will schedule independently. Obtain CBC, CMP, HgA1c, Lipid panel, TSH and vit D when fasting if not already done recently.   3) Weight:   Discussed goal of losing even 5-10% of current body weight which would improve overall feelings of well being and improve objective health data significantly.   Improve nutrient density of diet through increasing intake of fruits and vegetables and decreasing saturated/trans fats, white flour products and refined sugar products.   F-up preventative CPE in 1 year. F/up sooner for chronic care management as discussed and/or prn.   No problem-specific Assessment & Plan notes found for this encounter.    Orders Placed This Encounter  Procedures  . CBC with Differential/Platelet  . Hemoglobin A1c  . Lipid panel  . Magnesium  . Phosphorus  . TSH  . VITAMIN D 25 Hydroxy (Vit-D Deficiency, Fractures)  . Vitamin B12  .  Comprehensive metabolic panel     No orders of the defined types were placed in this encounter.    Discontinued Medications   AMITRIPTYLINE (ELAVIL) 25 MG TABLET    Take 1 tablet (25 mg total) by mouth at bedtime.   AMOXICILLIN-CLAVULANATE (AUGMENTIN) 875-125 MG TABLET    Take 1 tablet by mouth 2 (two) times daily.   GINGER ROOT POWD    50 mg by Does not apply route daily at 8 pm.      Please see orders placed and AVS handed out to patient at the end of our visit for further patient instructions/ counseling done pertaining to today's office visit.     Subjective:    Chief Complaint  Patient presents with  . Annual Exam    HPI: Leslie Dixon is a 71 y.o. female who presents to [facility] today a yearly health maintenance exam.  Health Maintenance Summary Reviewed and updated, unless pt declines services.  Colonoscopy:     Was completely normal 2012.  No polyps.  Per patient, no increase family history as her brother had a non-adematous polyp, non-cancerous.  Repeat 10 yrs according to USPSTF guidelines Tobacco History Reviewed:   Y - never CT scan for screening lung CA:   n/a Alcohol:    No concerns, no excessive use Exercise Habits:   meeting AHA guidelines STD concerns:   None- not sexually active much- but husband 63 yo and occ Drug Use:   None Birth control method:   n/a Menses regular:  N/a-  PAp= 2 yrs ago- Juanda Chance, NP - doesn't need anymore Lumps or breast concerns:      None; mammogram done every early August.  Sr. diagnosed with breast cancer late onset Breast Cancer Family History:      None Bone/ DEXA scan:    12/2015- needs repeat 2 yrs.    Health Maintenance  Topic Date Due  . PNA vac Low Risk Adult (2 of 2 - PPSV23) 04/20/2017  . INFLUENZA VACCINE  04/25/2017 (Originally 02/23/2017)  . COLONOSCOPY  02/08/2018 (Originally 06/07/2016)  . MAMMOGRAM  03/05/2019  . TETANUS/TDAP  04/20/2026  . DEXA SCAN  Completed  .  Hepatitis C Screening  Completed     Wt Readings from Last 3 Encounters:  04/22/17 140 lb 11.2 oz (63.8 kg)  02/23/17 138 lb 4.8 oz (62.7 kg)  02/08/17 139 lb (63 kg)   BP Readings from Last 3 Encounters:  04/22/17 128/74  02/23/17 (!) 109/58  02/08/17 104/63   Pulse Readings from Last 3 Encounters:  04/22/17 80  02/23/17 74  02/08/17 67     Past Medical History:  Diagnosis Date  . Blood dyscrasia    LEUKOPENIA- DR. CHISM - NO TREATMENT BUT IS MONITORED  . Chronic back pain   . Chronic back pain   . Fibromyalgia   . Foot drop, right    RELATED TO LUMBAR PROBLEMS  . GERD (gastroesophageal reflux disease)    PRILOSEC PRN  . History of kidney stones   . Horseshoe kidney    BILATERAL  . Leukopenia   . Migraine    OCCULAR MIGRAINES - AURA WITH EYES  . Osteoporosis   . Pain    LUMBAR PAIN - RECENT FALL BECAUSE LEGS GAVE OUT - PT HAS HAD LUMBAR INJECTION SINCE THE FALL THAT HAS HELPED BACK PAIN  . Pain    CHRONIC RIGHT UPPER QUADRANT PAIN - RELATED TO GALLBLADDER PROBLEM  . Sciatica of right side   . Sinusitis    f/u with Dr. Janace Hoard.   . Vertigo       Past Surgical History:  Procedure Laterality Date  . ABDOMINAL HYSTERECTOMY  1983  . APPENDECTOMY  1968  . BILATERAL SALPINGOOPHORECTOMY  2000  . BREAST EXCISIONAL BIOPSY Right 1979  . CHOLECYSTECTOMY N/A 04/12/2014   Procedure: LAPAROSCOPIC CHOLECYSTECTOMY WITH INTRAOPERATIVE CHOLANGIOGRAM;  Surgeon: Kaylyn Lim, MD;  Location: [facility];  Service: General;  Laterality: N/A;  . Lumbar/cervical surgeries     CERVICAL FUSION C7-C6 WITH BONE GRAFT; C6-C5 WITH PLATE AND 4 SCREWS;  3 LUMBAR SURGERIES BUT NO FUSION  . NASAL SINUS SURGERY  2010  . right knee     ARTHROSCOPY  . WISDOM TOOTH PULLED        Family History  Problem Relation Age of Onset  . Cancer Father        Lung  . Neurodegenerative disease Mother   . Cancer Brother        prostate  . Cancer Maternal Aunt        breast cancer  . Breast cancer  Maternal Aunt 66  . Cancer Maternal Grandfather        Leukemia  . Migraines Sister   . Breast cancer Sister 39  . Thyroid disease Daughter       History  Drug Use No  ,   History  Alcohol Use No    Comment: occassionally  ,   History  Smoking Status  . Never Smoker  Smokeless Tobacco  .  Never Used  ,   History  Sexual Activity  . Sexual activity: Yes  . Birth control/ protection: None    Current Outpatient Prescriptions on File Prior to Visit  Medication Sig Dispense Refill  . albuterol (PROVENTIL HFA;VENTOLIN HFA) 108 (90 Base) MCG/ACT inhaler Inhale 1-2 puffs into the lungs every 4 (four) hours as needed for wheezing or shortness of breath (only short term treatment). 1 Inhaler 1  . Cholecalciferol (VITAMIN D3) 5000 units TABS 5,000 IU OTC vitamin D3 daily. 90 tablet 3  . ibandronate (BONIVA) 150 MG tablet TAKE 1 TABLET BY MOUTH  EVERY 30 DAYS 3 tablet 1  . loratadine (CLARITIN) 10 MG tablet Take by mouth.    . meloxicam (MOBIC) 7.5 MG tablet Take 1-2 tablets daily for joint pains 180 tablet 1  . triamcinolone (NASACORT ALLERGY 24HR) 55 MCG/ACT AERO nasal inhaler Place 1 spray into the nose daily.    . Turmeric 500 MG CAPS Take 2 capsules by mouth daily at 8 pm.    . vitamin B-12 (CYANOCOBALAMIN) 1000 MCG tablet Take 1,000 mcg by mouth daily.    . vitamin C (ASCORBIC ACID) 500 MG tablet Take 500 mg by mouth daily.    . vitamin E 400 UNIT capsule Take 400 Units by mouth daily. Patient takes 2 a day     No current facility-administered medications on file prior to visit.     Allergies: Peanut oil; Clarithromycin; Codeine; Lactose; Other; Penicillins; Pregabalin; Sulfa antibiotics; Sulfonamide derivatives; and Cefdinir  Review of Systems: General:   Denies fever, chills, unexplained weight loss.  Optho/Auditory:   Denies visual changes, blurred vision/LOV Respiratory:   Denies SOB, DOE more than baseline levels.  Cardiovascular:   Denies chest pain,  palpitations, new onset peripheral edema  Gastrointestinal:   Denies nausea, vomiting, diarrhea.  Genitourinary: Denies dysuria, freq/ urgency, flank pain or discharge from genitals.  Endocrine:     Denies hot or cold intolerance, polyuria, polydipsia. Musculoskeletal:   Denies unexplained myalgias, joint swelling, unexplained arthralgias, gait problems.  Skin:  Denies rash, suspicious lesions Neurological:     Denies dizziness, unexplained weakness, numbness  Psychiatric/Behavioral:   Denies mood changes, suicidal or homicidal ideations, hallucinations    Objective:    Blood pressure 128/74, pulse 80, height 5\' 6"  (1.676 m), weight 140 lb 11.2 oz (63.8 kg). Body mass index is 22.71 kg/m. General Appearance:    Alert, cooperative, no distress, appears stated age  Head:    Normocephalic, without obvious abnormality, atraumatic  Eyes:    PERRL, conjunctiva/corneas clear, EOM's intact, fundi    benign, both eyes  Ears:    Normal TM's and external ear canals, both ears  Nose:   Nares normal, septum midline, mucosa normal, no drainage    or sinus tenderness  Throat:   Lips w/o lesion, mucosa moist, and tongue normal; teeth and   gums normal  Neck:   Supple, symmetrical, trachea midline, no adenopathy;    thyroid:  no enlargement/tenderness/nodules; no carotid   bruit or JVD  Back:     Symmetric, no curvature, ROM normal, no CVA tenderness  Lungs:     Clear to auscultation bilaterally, respirations unlabored, no       Wh/ R/ R  Chest Wall:    No tenderness or gross deformity; normal excursion   Heart:    Regular rate and rhythm, S1 and S2 normal, no murmur, rub   or gallop  Breast Exam:    Not done  Abdomen:  Soft, non-tender, bowel sounds active all four quadrants, NO   G/R/R, no masses, no organomegaly  Genitalia:  defer  Rectal:    defer  Extremities:   Extremities normal, atraumatic, no cyanosis or gross edema  Pulses:   2+ and symmetric all extremities  Skin:   Warm, dry, Skin  color, texture, turgor normal, no obvious rashes or lesions- gets yrly derm exams Psych: No HI/SI, judgement and insight good, Euthymic mood. Full Affect.  Neurologic:   CNII-XII intact, normal strength, sensation and reflexes    Throughout

## 2017-04-22 NOTE — Patient Instructions (Addendum)
So wait until mid-October and call and schedule just a lab-only visit to get your flu shot and get fasting labs    Preventive Care for Adults, Female  A healthy lifestyle and preventive care can promote health and wellness. Preventive health guidelines for women include the following key practices.   A routine yearly physical is a good way to check with your health care provider about your health and preventive screening. It is a chance to share any concerns and updates on your health and to receive a thorough exam.   Visit your dentist for a routine exam and preventive care every 6 months. Brush your teeth twice a day and floss once a day. Good oral hygiene prevents tooth decay and gum disease.   The frequency of eye exams is based on your age, health, family medical history, use of contact lenses, and other factors. Follow your health care provider's recommendations for frequency of eye exams.   Eat a healthy diet. Foods like vegetables, fruits, whole grains, low-fat dairy products, and lean protein foods contain the nutrients you need without too many calories. Decrease your intake of foods high in solid fats, added sugars, and salt. Eat the right amount of calories for you.Get information about a proper diet from your health care provider, if necessary.   Regular physical exercise is one of the most important things you can do for your health. Most adults should get at least 150 minutes of moderate-intensity exercise (any activity that increases your heart rate and causes you to sweat) each week. In addition, most adults need muscle-strengthening exercises on 2 or more days a week.   Maintain a healthy weight. The body mass index (BMI) is a screening tool to identify possible weight problems. It provides an estimate of body fat based on height and weight. Your health care provider can find your BMI, and can help you achieve or maintain a healthy weight.For adults 20 years and  older:   - A BMI below 18.5 is considered underweight.   - A BMI of 18.5 to 24.9 is normal.   - A BMI of 25 to 29.9 is considered overweight.   - A BMI of 30 and above is considered obese.   Maintain normal blood lipids and cholesterol levels by exercising and minimizing your intake of trans and saturated fats.  Eat a balanced diet with plenty of fruit and vegetables. Blood tests for lipids and cholesterol should begin at age 53 and be repeated every 5 years minimum.  If your lipid or cholesterol levels are high, you are over 40, or you are at high risk for heart disease, you may need your cholesterol levels checked more frequently.Ongoing high lipid and cholesterol levels should be treated with medicines if diet and exercise are not working.   If you smoke, find out from your health care provider how to quit. If you do not use tobacco, do not start.   Lung cancer screening is recommended for adults aged 45-80 years who are at high risk for developing lung cancer because of a history of smoking. A yearly low-dose CT scan of the lungs is recommended for people who have at least a 30-pack-year history of smoking and are a current smoker or have quit within the past 15 years. A pack year of smoking is smoking an average of 1 pack of cigarettes a day for 1 year (for example: 1 pack a day for 30 years or 2 packs a day for 15 years). Yearly  screening should continue until the smoker has stopped smoking for at least 15 years. Yearly screening should be stopped for people who develop a health problem that would prevent them from having lung cancer treatment.   If you are pregnant, do not drink alcohol. If you are breastfeeding, be very cautious about drinking alcohol. If you are not pregnant and choose to drink alcohol, do not have more than 1 drink per day. One drink is considered to be 12 ounces (355 mL) of beer, 5 ounces (148 mL) of wine, or 1.5 ounces (44 mL) of liquor.   Avoid use of street drugs.  Do not share needles with anyone. Ask for help if you need support or instructions about stopping the use of drugs.   High blood pressure causes heart disease and increases the risk of stroke. Your blood pressure should be checked at least yearly.  Ongoing high blood pressure should be treated with medicines if weight loss and exercise do not work.   If you are 54-41 years old, ask your health care provider if you should take aspirin to prevent strokes.   Diabetes screening involves taking a blood sample to check your fasting blood sugar level. This should be done once every 3 years, after age 54, if you are within normal weight and without risk factors for diabetes. Testing should be considered at a younger age or be carried out more frequently if you are overweight and have at least 1 risk factor for diabetes.   Breast cancer screening is essential preventive care for women. You should practice "breast self-awareness."  This means understanding the normal appearance and feel of your breasts and may include breast self-examination.  Any changes detected, no matter how small, should be reported to a health care provider.  Women in their 31s and 30s should have a clinical breast exam (CBE) by a health care provider as part of a regular health exam every 1 to 3 years.  After age 68, women should have a CBE every year.  Starting at age 78, women should consider having a mammogram (breast X-ray test) every year.  Women who have a family history of breast cancer should talk to their health care provider about genetic screening.  Women at a high risk of breast cancer should talk to their health care providers about having an MRI and a mammogram every year.   -Breast cancer gene (BRCA)-related cancer risk assessment is recommended for women who have family members with BRCA-related cancers. BRCA-related cancers include breast, ovarian, tubal, and peritoneal cancers. Having family members with these cancers  may be associated with an increased risk for harmful changes (mutations) in the breast cancer genes BRCA1 and BRCA2. Results of the assessment will determine the need for genetic counseling and BRCA1 and BRCA2 testing.   The Pap test is a screening test for cervical cancer. A Pap test can show cell changes on the cervix that might become cervical cancer if left untreated. A Pap test is a procedure in which cells are obtained and examined from the lower end of the uterus (cervix).   - Women should have a Pap test starting at age 71.   - Between ages 76 and 40, Pap tests should be repeated every 2 years.   - Beginning at age 27, you should have a Pap test every 3 years as [place] as the past 3 Pap tests have been normal.   - Some women have medical problems that increase the chance of getting  cervical cancer. Talk to your health care provider about these problems. It is especially important to talk to your health care provider if a new problem develops soon after your last Pap test. In these cases, your health care provider may recommend more frequent screening and Pap tests.   - The above recommendations are the same for women who have or have not gotten the vaccine for human papillomavirus (HPV).   - If you had a hysterectomy for a problem that was not cancer or a condition that could lead to cancer, then you no longer need Pap tests. Even if you no longer need a Pap test, a regular exam is a good idea to make sure no other problems are starting.   - If you are between ages 18 and 69 years, and you have had normal Pap tests going back 10 years, you no longer need Pap tests. Even if you no longer need a Pap test, a regular exam is a good idea to make sure no other problems are starting.   - If you have had past treatment for cervical cancer or a condition that could lead to cancer, you need Pap tests and screening for cancer for at least 20 years after your treatment.   - If Pap tests have been  discontinued, risk factors (such as a new sexual partner) need to be reassessed to determine if screening should be resumed.   - The HPV test is an additional test that may be used for cervical cancer screening. The HPV test looks for the virus that can cause the cell changes on the cervix. The cells collected during the Pap test can be tested for HPV. The HPV test could be used to screen women aged 53 years and older, and should be used in women of any age who have unclear Pap test results. After the age of 9, women should have HPV testing at the same frequency as a Pap test.   Colorectal cancer can be detected and often prevented. Most routine colorectal cancer screening begins at the age of 74 years and continues through age 20 years. However, your health care provider may recommend screening at an earlier age if you have risk factors for colon cancer. On a yearly basis, your health care provider may provide home test kits to check for hidden blood in the stool.  Use of a small camera at the end of a tube, to directly examine the colon (sigmoidoscopy or colonoscopy), can detect the earliest forms of colorectal cancer. Talk to your health care provider about this at age 23, when routine screening begins. Direct exam of the colon should be repeated every 5 -10 years through age 35 years, unless early forms of pre-cancerous polyps or small growths are found.   People who are at an increased risk for hepatitis B should be screened for this virus. You are considered at high risk for hepatitis B if:  -You were born in a country where hepatitis B occurs often. Talk with your health care provider about which countries are considered high risk.  - Your parents were born in a high-risk country and you have not received a shot to protect against hepatitis B (hepatitis B vaccine).  - You have HIV or AIDS.  - You use needles to inject street drugs.  - You live with, or have sex with, someone who has Hepatitis  B.  - You get hemodialysis treatment.  - You take certain medicines for conditions like cancer,  organ transplantation, and autoimmune conditions.   Hepatitis C blood testing is recommended for all people born from [birth year] through 1965 and any individual with known risks for hepatitis C.   Practice safe sex. Use condoms and avoid high-risk sexual practices to reduce the spread of sexually transmitted infections (STIs). STIs include gonorrhea, chlamydia, syphilis, trichomonas, herpes, HPV, and human immunodeficiency virus (HIV). Herpes, HIV, and HPV are viral illnesses that have no cure. They can result in disability, cancer, and death. Sexually active women aged 65 years and younger should be checked for chlamydia. Older women with new or multiple partners should also be tested for chlamydia. Testing for other STIs is recommended if you are sexually active and at increased risk.   Osteoporosis is a disease in which the bones lose minerals and strength with aging. This can result in serious bone fractures or breaks. The risk of osteoporosis can be identified using a bone density scan. Women ages 5 years and over and women at risk for fractures or osteoporosis should discuss screening with their health care providers. Ask your health care provider whether you should take a calcium supplement or vitamin D to There are also several preventive steps women can take to avoid osteoporosis and resulting fractures or to keep osteoporosis from worsening. -->Recommendations include:  Eat a balanced diet high in fruits, vegetables, calcium, and vitamins.  Get enough calcium. The recommended total intake of is 1,200 mg daily; for best absorption, if taking supplements, divide doses into 250-500 mg doses throughout the day. Of the two types of calcium, calcium carbonate is best absorbed when taken with food but calcium citrate can be taken on an empty stomach.  Get enough vitamin D. [employer] and the [employer] recommend at least 1,000 IU per day for women age 67 and over who are at risk of vitamin D deficiency. Vitamin D deficiency can be caused by inadequate sun exposure (for example, those who live in [place]).  Avoid alcohol and smoking. Heavy alcohol intake (more than 7 drinks per week) increases the risk of falls and hip fracture and women smokers tend to lose bone more rapidly and have lower bone mass than nonsmokers. Stopping smoking is one of the most important changes women can make to improve their health and decrease risk for disease.  Be physically active every day. Weight-bearing exercise (for example, fast walking, hiking, jogging, and weight training) may strengthen bones or slow the rate of bone loss that comes with aging. Balancing and muscle-strengthening exercises can reduce the risk of falling and fracture.  Consider therapeutic medications. Currently, several types of effective drugs are available. Healthcare providers can recommend the type most appropriate for each woman.  Eliminate environmental factors that may contribute to accidents. Falls cause nearly 90% of all osteoporotic fractures, so reducing this risk is an important bone-health strategy. Measures include ample lighting, removing obstructions to walking, using nonskid rugs on floors, and placing mats and/or grab bars in showers.  Be aware of medication side effects. Some common medicines make bones weaker. These include a type of steroid drug called glucocorticoids used for arthritis and asthma, some antiseizure drugs, certain sleeping pills, treatments for endometriosis, and some cancer drugs. An overactive thyroid gland or using too much thyroid hormone for an underactive thyroid can also be a problem. If you are taking these medicines, talk to your doctor about what you can do to help protect your bones.reduce the rate of osteoporosis.    Menopause can be associated  with physical  symptoms and risks. Hormone replacement therapy is available to decrease symptoms and risks. You should talk to your health care provider about whether hormone replacement therapy is right for you.   Use sunscreen. Apply sunscreen liberally and repeatedly throughout the day. You should seek shade when your shadow is shorter than you. Protect yourself by wearing long sleeves, pants, a wide-brimmed hat, and sunglasses year round, whenever you are outdoors.   Once a month, do a whole body skin exam, using a mirror to look at the skin on your back. Tell your health care provider of new moles, moles that have irregular borders, moles that are larger than a pencil eraser, or moles that have changed in shape or color.   -Stay current with required vaccines (immunizations).   Influenza vaccine. All adults should be immunized every year.  Tetanus, diphtheria, and acellular pertussis (Td, Tdap) vaccine. Pregnant women should receive 1 dose of Tdap vaccine during each pregnancy. The dose should be obtained regardless of the length of time since the last dose. Immunization is preferred during the 27th 36th week of gestation. An adult who has not previously received Tdap or who does not know her vaccine status should receive 1 dose of Tdap. This initial dose should be followed by tetanus and diphtheria toxoids (Td) booster doses every 10 years. Adults with an unknown or incomplete history of completing a 3-dose immunization series with Td-containing vaccines should begin or complete a primary immunization series including a Tdap dose. Adults should receive a Td booster every 10 years.  Varicella vaccine. An adult without evidence of immunity to varicella should receive 2 doses or a second dose if she has previously received 1 dose. Pregnant females who do not have evidence of immunity should receive the first dose after pregnancy. This first dose should be obtained before leaving the health care facility. The  second dose should be obtained 4 8 weeks after the first dose.  Human papillomavirus (HPV) vaccine. Females aged 12 26 years who have not received the vaccine previously should obtain the 3-dose series. The vaccine is not recommended for use in pregnant females. However, pregnancy testing is not needed before receiving a dose. If a female is found to be pregnant after receiving a dose, no treatment is needed. In that case, the remaining doses should be delayed until after the pregnancy. Immunization is recommended for any person with an immunocompromised condition through the age of 26 years if she did not get any or all doses earlier. During the 3-dose series, the second dose should be obtained 4 8 weeks after the first dose. The third dose should be obtained 24 weeks after the first dose and 16 weeks after the second dose.  Zoster vaccine. One dose is recommended for adults aged 44 years or older unless certain conditions are present.  Measles, mumps, and rubella (MMR) vaccine. Adults born before [birth year] generally are considered immune to measles and mumps. Adults born in [birth year] or later should have 1 or more doses of MMR vaccine unless there is a contraindication to the vaccine or there is laboratory evidence of immunity to each of the three diseases. A routine second dose of MMR vaccine should be obtained at least 28 days after the first dose for students attending postsecondary schools, health care workers, or international travelers. People who received inactivated measles vaccine or an unknown type of measles vaccine during 1963 1967 should receive 2 doses of MMR vaccine. People who received inactivated mumps vaccine  or an unknown type of mumps vaccine before 1979 and are at high risk for mumps infection should consider immunization with 2 doses of MMR vaccine. For females of childbearing age, rubella immunity should be determined. If there is no evidence of immunity, females who are not pregnant should be  vaccinated. If there is no evidence of immunity, females who are pregnant should delay immunization until after pregnancy. Unvaccinated health care workers born before [birth year] who lack laboratory evidence of measles, mumps, or rubella immunity or laboratory confirmation of disease should consider measles and mumps immunization with 2 doses of MMR vaccine or rubella immunization with 1 dose of MMR vaccine.  Pneumococcal 13-valent conjugate (PCV13) vaccine. When indicated, a person who is uncertain of her immunization history and has no record of immunization should receive the PCV13 vaccine. An adult aged 57 years or older who has certain medical conditions and has not been previously immunized should receive 1 dose of PCV13 vaccine. This PCV13 should be followed with a dose of pneumococcal polysaccharide (PPSV23) vaccine. The PPSV23 vaccine dose should be obtained at least 8 weeks after the dose of PCV13 vaccine. An adult aged 53 years or older who has certain medical conditions and previously received 1 or more doses of PPSV23 vaccine should receive 1 dose of PCV13. The PCV13 vaccine dose should be obtained 1 or more years after the last PPSV23 vaccine dose.  Pneumococcal polysaccharide (PPSV23) vaccine. When PCV13 is also indicated, PCV13 should be obtained first. All adults aged 95 years and older should be immunized. An adult younger than age 16 years who has certain medical conditions should be immunized. Any person who resides in a nursing home or long-term care facility should be immunized. An adult smoker should be immunized. People with an immunocompromised condition and certain other conditions should receive both PCV13 and PPSV23 vaccines. People with human immunodeficiency virus (HIV) infection should be immunized as soon as possible after diagnosis. Immunization during chemotherapy or radiation therapy should be avoided. Routine use of PPSV23 vaccine is not recommended for American Indians, [place]  Natives, or people younger than 65 years unless there are medical conditions that require PPSV23 vaccine. When indicated, people who have unknown immunization and have no record of immunization should receive PPSV23 vaccine. One-time revaccination 5 years after the first dose of PPSV23 is recommended for people aged 13 64 years who have chronic kidney failure, nephrotic syndrome, asplenia, or immunocompromised conditions. People who received 1 2 doses of PPSV23 before age 13 years should receive another dose of PPSV23 vaccine at age 72 years or later if at least 5 years have passed since the previous dose. Doses of PPSV23 are not needed for people immunized with PPSV23 at or after age 75 years.  Meningococcal vaccine. Adults with asplenia or persistent complement component deficiencies should receive 2 doses of quadrivalent meningococcal conjugate (MenACWY-D) vaccine. The doses should be obtained at least 2 months apart. Microbiologists working with certain meningococcal bacteria, [employer] recruits, people at risk during an outbreak, and people who travel to or live in countries with a high rate of meningitis should be immunized. A first-year college student up through age 61 years who is living in a residence hall should receive a dose if she did not receive a dose on or after her 16th birthday. Adults who have certain high-risk conditions should receive one or more doses of vaccine.  Hepatitis A vaccine. Adults who wish to be protected from this disease, have certain high-risk conditions, work with hepatitis  A-infected animals, work in hepatitis A research labs, or travel to or work in countries with a high rate of hepatitis A should be immunized. Adults who were previously unvaccinated and who anticipate close contact with an international adoptee during the first 60 days after arrival in the [place] from a country with a high rate of hepatitis A should be immunized.  Hepatitis B vaccine.  Adults who  wish to be protected from this disease, have certain high-risk conditions, may be exposed to blood or other infectious body fluids, are household contacts or sex partners of hepatitis B positive people, are clients or workers in certain care facilities, or travel to or work in countries with a high rate of hepatitis B should be immunized.  Haemophilus influenzae type b (Hib) vaccine. A previously unvaccinated person with asplenia or sickle cell disease or having a scheduled splenectomy should receive 1 dose of Hib vaccine. Regardless of previous immunization, a recipient of a hematopoietic stem cell transplant should receive a 3-dose series 6 12 months after her successful transplant. Hib vaccine is not recommended for adults with HIV infection.  Preventive Services / Frequency   Ages 65 years and over  Blood pressure check.** / Every 1 to 2 years.  Lipid and cholesterol check.** / Every 5 years beginning at age 50 years.  Lung cancer screening. / Every year if you are aged 28 80 years and have a 30-pack-year history of smoking and currently smoke or have quit within the past 15 years. Yearly screening is stopped once you have quit smoking for at least 15 years or develop a health problem that would prevent you from having lung cancer treatment.  Clinical breast exam.** / Every year after age 65 years.  BRCA-related cancer risk assessment.** / For women who have family members with a BRCA-related cancer (breast, ovarian, tubal, or peritoneal cancers).  Mammogram.** / Every year beginning at age 40 years and continuing for as long as you are in good health. Consult with your health care provider.  Pap test.** / Every 3 years starting at age 45 years through age 5 or 38 years with 3 consecutive normal Pap tests. Testing can be stopped between 65 and 70 years with 3 consecutive normal Pap tests and no abnormal Pap or HPV tests in the past 10 years.  HPV screening.** / Every 3 years from ages 54  years through ages 12 or 18 years with a history of 3 consecutive normal Pap tests. Testing can be stopped between 65 and 70 years with 3 consecutive normal Pap tests and no abnormal Pap or HPV tests in the past 10 years.  Fecal occult blood test (FOBT) of stool. / Every year beginning at age 40 years and continuing until age 77 years. You may not need to do this test if you get a colonoscopy every 10 years.  Flexible sigmoidoscopy or colonoscopy.** / Every 5 years for a flexible sigmoidoscopy or every 10 years for a colonoscopy beginning at age 81 years and continuing until age 53 years.  Hepatitis C blood test.** / For all people born from [birth year] through 1967 and any individual with known risks for hepatitis C.  Osteoporosis screening.** / A one-time screening for women ages 66 years and over and women at risk for fractures or osteoporosis.  Skin self-exam. / Monthly.  Influenza vaccine. / Every year.  Tetanus, diphtheria, and acellular pertussis (Tdap/Td) vaccine.** / 1 dose of Td every 10 years.  Varicella vaccine.** / Consult your health  care provider.  Zoster vaccine.** / 1 dose for adults aged 27 years or older.  Pneumococcal 13-valent conjugate (PCV13) vaccine.** / Consult your health care provider.  Pneumococcal polysaccharide (PPSV23) vaccine.** / 1 dose for all adults aged 34 years and older.  Meningococcal vaccine.** / Consult your health care provider.  Hepatitis A vaccine.** / Consult your health care provider.  Hepatitis B vaccine.** / Consult your health care provider.  Haemophilus influenzae type b (Hib) vaccine.** / Consult your health care provider. ** Family history and personal history of risk and conditions may change your health care provider's recommendations. Document Released: 09/07/2001 Document Revised: 05/02/2013  [facility] Patient Information 2014 [place], [place].   EXERCISE AND DIET:  We recommended that you start or continue a regular exercise program  for good health. Regular exercise means any activity that makes your heart beat faster and makes you sweat.  We recommend exercising at least 30 minutes per day at least 3 days a week, preferably 5.  We also recommend a diet low in fat and sugar / carbohydrates.  Inactivity, poor dietary choices and obesity can cause diabetes, heart attack, stroke, and kidney damage, among others.     ALCOHOL AND SMOKING:  Women should limit their alcohol intake to no more than 7 drinks/beers/glasses of wine (combined, not each!) per week. Moderation of alcohol intake to this level decreases your risk of breast cancer and liver damage.  ( And of course, no recreational drugs are part of a healthy lifestyle.)  Also, you should not be smoking at all or even being exposed to second hand smoke. Most people know smoking can cause cancer, and various heart and lung diseases, but did you know it also contributes to weakening of your bones?  Aging of your skin?  Yellowing of your teeth and nails?   CALCIUM AND VITAMIN D:  Adequate intake of calcium and Vitamin D are recommended.  The recommendations for exact amounts of these supplements seem to change often, but generally speaking 600 mg of calcium (either carbonate or citrate) and 800 units of Vitamin D per day seems prudent. Certain women may benefit from higher intake of Vitamin D.  If you are among these women, your doctor will have told you during your visit.     PAP SMEARS:  Pap smears, to check for cervical cancer or precancers,  have traditionally been done yearly, although recent scientific advances have shown that most women can have pap smears less often.  However, every woman still should have a physical exam from her gynecologist or primary care physician every year. It will include a breast check, inspection of the vulva and vagina to check for abnormal growths or skin changes, a visual exam of the cervix, and then an exam to evaluate the size and shape of the  uterus and ovaries.  And after 73 years of age, a rectal exam is indicated to check for rectal cancers. We will also provide age appropriate advice regarding health maintenance, like when you should have certain vaccines, screening for sexually transmitted diseases, bone density testing, colonoscopy, mammograms, etc.    MAMMOGRAMS:  All women over 28 years old should have a yearly mammogram. Many facilities now offer a "3D" mammogram, which may cost around $50 extra out of pocket. If possible,  we recommend you accept the option to have the 3D mammogram performed.  It both reduces the number of women who will be called back for extra views which then turn out to be normal,  and it is better than the routine mammogram at detecting truly abnormal areas.     COLONOSCOPY:  Colonoscopy to screen for colon cancer is recommended for all women at age 1.  We know, you hate the idea of the prep.  We agree, BUT, having colon cancer and not knowing it is worse!!  Colon cancer so often starts as a polyp that can be seen and removed at colonscopy, which can quite literally save your life!  And if your first colonoscopy is normal and you have no family history of colon cancer, most women don't have to have it again for 10 years.  Once every ten years, you can do something that may end up saving your life, right?  We will be happy to help you get it scheduled when you are ready.  Be sure to check your insurance coverage so you understand how much it will cost.  It may be covered as a preventative service at no cost, but you should check your particular policy.    Preventive Care for Adults  A healthy lifestyle and preventive care can promote health and wellness. Preventive health guidelines for men include the following key practices:  .   A routine yearly physical is a good way to check with your health care provider about your health and preventative screening. It is a chance to share any concerns and updates on  your health and to receive a thorough exam.  .  Visit your dentist for a routine exam and preventative care every 6 months. Brush your teeth twice a day and floss once a day. Good oral hygiene prevents tooth decay and gum disease.  .  The frequency of eye exams is based on your age, health, family medical history, use of contact lenses, and other factors.  Follow your health care provider's recommendations for frequency of eye exams.  .  Eat a healthy diet.  Foods such as vegetables, fruits, whole grains, low-fat dairy products, and lean protein foods contain the nutrients you need without too many calories.  Decrease your intake of foods high in solid fats, added sugars, and salt.  Eat the right amount of calories for you.  Get information about a proper diet from your health care provider, if necessary.  .  Regular physical exercise is one of the most important things you can do for your health.  Most adults should get at least 150 minutes of moderate-intensity exercise (any activity that increases your heart rate and causes you to sweat) each week.  In addition, most adults need muscle-strengthening exercises on 2 or more days a week.  .  Maintain a healthy weight. The body mass index (BMI) is a screening tool to identify possible weight problems. It provides an estimate of body fat based on height and weight. Your health care provider can find your BMI and can help you achieve or maintain a healthy weight. For adults 20 years and older: A BMI below 18.5 is considered underweight. A BMI of 18.5 to 24.9 is normal. A BMI of 25 to 29.9 is considered overweight. A BMI of 30 and above is considered obese.  .  Maintain normal blood lipids and cholesterol levels by exercising and minimizing your intake of saturated fat. Eat a balanced diet with plenty of fruit and vegetables. Blood tests for lipids and cholesterol should begin at age 48 and be repeated every 5 years. If your lipid or cholesterol  levels are high, you are over 50, or you  are at high risk for heart disease, you may need your cholesterol levels checked more frequently. Ongoing high lipid and cholesterol levels should be treated with medicines if diet and exercise are not working.  .  If you smoke, find out from your health care provider how to quit. If you do not use tobacco, do not start.  . If you choose to drink alcohol, do not have more than 2 drinks per day. One drink is considered to be 12 ounces (355 mL) of beer, 5 ounces (148 mL) of wine, or 1.5 ounces (44 mL) of liquor.  [place] Kitchen Avoid use of street drugs. Do not share needles with anyone. Ask for help if you need support or instructions about stopping the use of drugs.  . High blood pressure causes heart disease and increases the risk of stroke. Your blood pressure should be checked at least every 1-2 years. Ongoing high blood pressure should be treated with medicines, if weight loss and exercise are not effective.  . If you are 53-33 years old, ask your health care provider if you should take aspirin to prevent heart disease.  . Diabetes screening involves taking a blood sample to check your fasting blood sugar level.  This should be done once every 3 years, after age 81, if you are within normal weight and without risk factors for diabetes.  Testing should be considered at a younger age or be carried out more frequently if you are overweight and have at least 1 risk factor for diabetes.  . Colorectal cancer can be detected and often prevented. Most routine colorectal cancer screening begins at the age of 18 and continues through age 77. However, your health care provider may recommend screening at an earlier age if you have risk factors for colon cancer. On a yearly basis, your health care provider may provide home test kits to check for hidden blood in the stool. Use of a small camera at the end of a tube to directly examine the colon (sigmoidoscopy or colonoscopy)  can detect the earliest forms of colorectal cancer. Talk to your health care provider about this at age 51, when routine screening begins. Direct exam of the colon should be repeated every 5-10 years through age 49, unless early forms of precancerous polyps or small growths are found.  .  Lung cancer screening is recommended for adults aged 55-80 years who are at high risk for developing lung cancer because of a history of smoking. A yearly low-dose CT scan of the lungs is recommended for people who have at least a 30-pack-year history of smoking and are a current smoker or have quit within the past 15 years. A pack year of smoking is smoking an average of 1 pack of cigarettes a day for 1 year (for example: 1 pack a day for 30 years or 2 packs a day for 15 years). Yearly screening should continue until the smoker has stopped smoking for at least 15 years. Yearly screening should be stopped for people who develop a health problem that would prevent them from having lung cancer treatment.  . Talk with your health care provider about prostate cancer screening.  . Testicular cancer screening is recommended for adult males. Screening includes self-exam and a health care provider exam. Consult with your health care provider about any symptoms you have or any concerns you have about testicular cancer.  . Use sunscreen. Apply sunscreen liberally and repeatedly throughout the day. You should seek shade when your shadow is  shorter than you. Protect yourself by wearing long sleeves, pants, a wide-brimmed hat, and sunglasses year round, whenever you are outdoors.  . Once a month, do a whole-body skin exam, using a mirror to look at the skin on your back. Tell your health care provider about new moles, moles that have irregular borders, moles that are larger than a pencil eraser, or moles that have changed in shape or color.    ++++++++++++++++++++++++++++++++++++++++++++++++++++++++++++++++++  Stay current  with required vaccines (immunizations).  ? Influenza vaccine. All adults should be immunized every year.  ? Tetanus, diphtheria, and acellular pertussis (Td, Tdap) vaccine. An adult who has not previously received Tdap or who does not know his vaccine status should receive 1 dose of Tdap. This initial dose should be followed by tetanus and diphtheria toxoids (Td) booster doses every 10 years. Adults with an unknown or incomplete history of completing a 3-dose immunization series with Td-containing vaccines should begin or complete a primary immunization series including a Tdap dose. Adults should receive a Td booster every 10 years.  ? Varicella vaccine. An adult without evidence of immunity to varicella should receive 2 doses or a second dose if he has previously received 1 dose.  ? Human papillomavirus (HPV) vaccine. Males aged 32-21 years who have not received the vaccine previously should receive the 3-dose series. Males aged 22-26 years may be immunized. Immunization is recommended through the age of 21 years for any female who has sex with males and did not get any or all doses earlier. Immunization is recommended for any person with an immunocompromised condition through the age of 60 years if he did not get any or all doses earlier. During the 3-dose series, the second dose should be obtained 4-8 weeks after the first dose. The third dose should be obtained 24 weeks after the first dose and 16 weeks after the second dose.  ? Zoster vaccine. One dose is recommended for adults aged 22 years or older unless certain conditions are present.   ? PREVNAR - Pneumococcal 13-valent conjugate (PCV13) vaccine. When indicated, a person who is uncertain of his immunization history and has no record of immunization should receive the PCV13 vaccine. An adult aged 37 years or older who has certain medical conditions and has not been previously immunized should receive 1 dose of PCV13 vaccine. This PCV13  should be followed with a dose of pneumococcal polysaccharide (PPSV23) vaccine. The PPSV23 vaccine dose should be obtained at least 8 weeks after the dose of PCV13 vaccine. An adult aged 100 years or older who has certain medical conditions and previously received 1 or more doses of PPSV23 vaccine should receive 1 dose of PCV13. The PCV13 vaccine dose should be obtained 1 or more years after the last PPSV23 vaccine dose.   ? PNEUMOVAX - Pneumococcal polysaccharide (PPSV23) vaccine. When PCV13 is also indicated, PCV13 should be obtained first. All adults aged 54 years and older should be immunized. An adult younger than age 27 years who has certain medical conditions should be immunized. Any person who resides in a nursing home or long-term care facility should be immunized. An adult smoker should be immunized. People with an immunocompromised condition and certain other conditions should receive both PCV13 and PPSV23 vaccines. People with human immunodeficiency virus (HIV) infection should be immunized as soon as possible after diagnosis. Immunization during chemotherapy or radiation therapy should be avoided. Routine use of PPSV23 vaccine is not recommended for American Indians, [place] Natives, or people younger than  65 years unless there are medical conditions that require PPSV23 vaccine. When indicated, people who have unknown immunization and have no record of immunization should receive PPSV23 vaccine. One-time revaccination 5 years after the first dose of PPSV23 is recommended for people aged 19-64 years who have chronic kidney failure, nephrotic syndrome, asplenia, or immunocompromised conditions. People who received 1-2 doses of PPSV23 before age 60 years should receive another dose of PPSV23 vaccine at age 61 years or later if at least 5 years have passed since the previous dose. Doses of PPSV23 are not needed for people immunized with PPSV23 at or after age 65 years.   ? Hepatitis A vaccine. Adults  who wish to be protected from this disease, have certain high-risk conditions, work with hepatitis A-infected animals, work in hepatitis A research labs, or travel to or work in countries with a high rate of hepatitis A should be immunized. Adults who were previously unvaccinated and who anticipate close contact with an international adoptee during the first 60 days after arrival in the [place] from a country with a high rate of hepatitis A should be immunized.  ? Hepatitis B vaccine. Adults should be immunized if they wish to be protected from this disease, have certain high-risk conditions, may be exposed to blood or other infectious body fluids, are household contacts or sex partners of hepatitis B positive people, are clients or workers in certain care facilities, or travel to or work in countries with a high rate of hepatitis B.     Preventive Service / Frequency  . Ages 4 to 4  Blood pressure check.  Lipid and cholesterol check  Lung cancer screening. / Every year if you are aged 67-80 years and have a 30-pack-year history of smoking and currently smoke or have quit within the past 15 years. Yearly screening is stopped once you have quit smoking for at least 15 years or develop a health problem that would prevent you from having lung cancer treatment.  Fecal occult blood test (FOBT) of stool. / Every year beginning at age 52 and continuing until age 33. You may not have to do this test if you get a colonoscopy every 10 years.  Flexible sigmoidoscopy** or colonoscopy.** / Every 5 years for a flexible sigmoidoscopy or every 10 years for a colonoscopy beginning at age 2 and continuing until age 20. Screening for abdominal aortic aneurysm (AAA) by ultrasound is recommended for people who have history of high blood pressure or who are current or former smokers.  ++++++++++++++++++++++++++++++++++++++++++++++++++++++++++++++  Recommend Adult Low Dose Aspirin or coated Aspirin 81 mg  daily To reduce risk of Colon Cancer 20 % Skin Cancer 26 %  Melanoma 46% and Pancreatic cancer 60%  +++++++++++++++++++++++++++++++++++++++++++++++++++++++++++++  Vitamin D goal is between 50-100. Please make sure that you are taking your Vitamin D as directed.  It is very important as a natural anti-inflammatory - helping with muscle and joint aches; as well as helping hair, skin, and nails; as well as reducing stroke, heart attack and cancer risk. It helps your bones and helps with mood. It also decreases numerous cancer risks so please take it as directed.  - Low Vit D is associated with a 200-300% higher risk for CANCER and 200-300% higher risk for HEART ATTACK & STROKE.  It is also associated with higher death rate at younger ages, autoimmune diseases like Rheumatoid arthritis, Lupus, Multiple Sclerosis; also many other serious conditions, like depression, Alzheimer's Dementia, infertility, muscle aches, fatigue, fibromyalgia - just  to name a few.  +++++++++++++++++++++++++++++++++++++++++++++++++++++++++++  Recommend the book "The END of DIETING" by Dr Excell Seltzer & the book "The END of DIABETES " by Dr Excell Seltzer At [email or web address] - get book & Audio CD's   --->Being diabetic has a 300% increased risk for heart attack, stroke, cancer, and alzheimer- type vascular dementia. It is very important that you work harder with diet by avoiding all foods that are white. Avoid white rice (brown & wild rice is OK), white potatoes (sweet potatoes in moderation is OK), White bread or wheat bread or anything made out of white flour like bagels, donuts, rolls, buns, biscuits, cakes, pastries, cookies, pizza crust, and pasta (made from white flour & egg whites) - vegetarian pasta or spinach or wheat pasta is OK. Multigrain breads like Arnold's or Pepper[employer], or multigrain sandwich thins or flatbreads. Diet, exercise and weight loss can reverse and cure diabetes in the early stages. Diet, exercise  and weight loss is very important in the control and prevention of complications of diabetes which affects every system in your body, ie. Brain - dementia/stroke, eyes - glaucoma/blindness, heart - heart attack/heart failure, kidneys - dialysis, stomach - gastric paralysis, intestines - malabsorption, nerves - severe painful neuritis, circulation - gangrene & loss of a leg(s), and finally cancer and Alzheimers.  I recommend avoid fried & greasy foods, sweets/candy, white rice (brown or wild rice or Quinoa is OK), white potatoes (sweet potatoes are OK) - anything made from white flour - bagels, doughnuts, rolls, buns, biscuits,white and wheat breads, pizza crust and traditional pasta made of white flour & egg white(vegetarian pasta or spinach or wheat pasta is OK). Multi-grain bread is OK - like multi-grain flat bread or sandwich thins. Avoid alcohol in excess.  Exercise is also important. Eat all the vegetables you want - avoid fatty meats, especially red meat and dairy - especially cheese. Cheese is the most concentrated form of trans-fats which is the worst thing to clog up our arteries. Veggie cheese is OK which can be found in the fresh produce section at [employer] or [employer] or [employer].  ++++++++++++++++++++++ DASH Eating Plan  DASH stands for "Dietary Approaches to Stop Hypertension."  The DASH eating plan is a healthy eating plan that has been shown to reduce high blood pressure (hypertension). Additional health benefits may include reducing the risk of type 2 diabetes mellitus, heart disease, and stroke. The DASH eating plan may also help with weight loss.  WHAT DO I NEED TO KNOW ABOUT THE DASH EATING PLAN? For the DASH eating plan, you will follow these general guidelines: . Choose foods with a percent daily value for sodium of less than 5% (as listed on the food label). . Use salt-free seasonings or herbs instead of table salt or sea salt. . Check with your health care  provider or pharmacist before using salt substitutes. . Eat lower-sodium products, often labeled as "lower sodium" or "no salt added." . Eat fresh foods. . Eat more vegetables, fruits, and low-fat dairy products. . Choose whole grains. Look for the word "whole" as the first word in the ingredient list. . Choose fish . Limit sweets, desserts, sugars, and sugary drinks. . Choose heart-healthy fats. . Eat veggie cheese . Eat more home-cooked food and less restaurant, buffet, and fast food. . Limit fried foods. Lacinda Axon foods using methods other than frying. . Limit canned vegetables. If you do use them, rinse them well to decrease the sodium. . When eating at a  restaurant, ask that your food be prepared with less salt, or no salt if possible.   WHAT FOODS CAN I EAT? Read Dr Fara Olden Fuhrman's books on The End of Dieting & The End of Diabetes  Grains Whole grain or whole wheat bread. Brown rice. Whole grain or whole wheat pasta. Quinoa, bulgur, and whole grain cereals. Low-sodium cereals. Corn or whole wheat flour tortillas. Whole grain cornbread. Whole grain crackers. Low-sodium crackers.  Vegetables Fresh or frozen vegetables (raw, steamed, roasted, or grilled). Low-sodium or reduced-sodium tomato and vegetable juices. Low-sodium or reduced-sodium tomato sauce and paste. Low-sodium or reduced-sodium canned vegetables.  Fruits All fresh, canned (in natural juice), or frozen fruits.  Protein Products All fish and seafood. Dried beans, peas, or lentils. Unsalted nuts and seeds. Unsalted canned beans.  Dairy Low-fat dairy products, such as skim or 1% milk, 2% or reduced-fat cheeses, low-fat ricotta or cottage cheese, or plain low-fat yogurt. Low-sodium or reduced-sodium cheeses.  Fats and Oils Tub margarines without trans fats. Light or reduced-fat mayonnaise and salad dressings (reduced sodium). Avocado. Safflower, olive, or canola oils. Natural peanut or almond  butter.  Other Unsalted popcorn and pretzels. The items listed above may not be a complete list of recommended foods or beverages. Contact your dietitian for more options.  ++++++++++++++++++++++++++++++++++++++++++++++++++++++++++++++++  WHAT FOODS ARE NOT RECOMMENDED?  Grains/ White flour or wheat flour White bread. White pasta. White rice. Refined cornbread. Bagels and croissants. Crackers that contain trans fat. Vegetables Creamed or fried vegetables. Vegetables in a . Regular canned vegetables. Regular canned tomato sauce and paste. Regular tomato and vegetable juices. Fruits Dried fruits. Canned fruit in light or heavy syrup. Fruit juice. Meat and Other Protein Products Meat in general - RED meat & White meat. Fatty cuts of meat. Ribs, chicken wings, all processed meats as bacon, sausage, bologna, salami, fatback, hot dogs, bratwurst and packaged luncheon meats. Dairy Whole or 2% milk, cream, half-and-half, and cream cheese. Whole-fat or sweetened yogurt. Full-fat cheeses or blue cheese. Non-dairy creamers and whipped toppings. Processed cheese, cheese spreads, or cheese curds.  Condiments Onion and garlic salt, seasoned salt, table salt, and sea salt. Canned and packaged gravies. Worcestershire sauce. Tartar sauce. Barbecue sauce. Teriyaki sauce. Soy sauce, including reduced sodium. Steak sauce. Fish sauce. Oyster sauce. Cocktail sauce. Horseradish. Ketchup and mustard. Meat flavorings and tenderizers. Bouillon cubes. Hot sauce. Tabasco sauce. Marinades. Taco seasonings. Relishes. Fats and Oils Butter, stick margarine, lard, shortening and bacon fat. Coconut, palm kernel, or palm oils. Regular salad dressings. Pickles and olives. Salted popcorn and pretzels. The items listed above may not be a complete list of foods and beverages to avoid.

## 2017-04-26 DIAGNOSIS — Z9889 Other specified postprocedural states: Secondary | ICD-10-CM | POA: Diagnosis not present

## 2017-04-26 DIAGNOSIS — Z981 Arthrodesis status: Secondary | ICD-10-CM | POA: Diagnosis not present

## 2017-04-26 DIAGNOSIS — Z791 Long term (current) use of non-steroidal anti-inflammatories (NSAID): Secondary | ICD-10-CM | POA: Diagnosis not present

## 2017-04-26 DIAGNOSIS — G8929 Other chronic pain: Secondary | ICD-10-CM | POA: Diagnosis not present

## 2017-04-26 DIAGNOSIS — Z880 Allergy status to penicillin: Secondary | ICD-10-CM | POA: Diagnosis not present

## 2017-04-26 DIAGNOSIS — Z888 Allergy status to other drugs, medicaments and biological substances status: Secondary | ICD-10-CM | POA: Diagnosis not present

## 2017-04-26 DIAGNOSIS — Z885 Allergy status to narcotic agent status: Secondary | ICD-10-CM | POA: Diagnosis not present

## 2017-04-26 DIAGNOSIS — Z881 Allergy status to other antibiotic agents status: Secondary | ICD-10-CM | POA: Diagnosis not present

## 2017-04-26 DIAGNOSIS — Z79891 Long term (current) use of opiate analgesic: Secondary | ICD-10-CM | POA: Diagnosis not present

## 2017-04-26 DIAGNOSIS — M25521 Pain in right elbow: Secondary | ICD-10-CM | POA: Diagnosis not present

## 2017-05-06 ENCOUNTER — Ambulatory Visit: Payer: 59

## 2017-05-10 ENCOUNTER — Ambulatory Visit (INDEPENDENT_AMBULATORY_CARE_PROVIDER_SITE_OTHER): Payer: 59 | Admitting: Family Medicine

## 2017-05-10 ENCOUNTER — Encounter: Payer: Self-pay | Admitting: Family Medicine

## 2017-05-10 VITALS — BP 126/73 | HR 66 | Temp 98.1°F | Ht 66.0 in | Wt 136.0 lb

## 2017-05-10 DIAGNOSIS — Z8639 Personal history of other endocrine, nutritional and metabolic disease: Secondary | ICD-10-CM

## 2017-05-10 DIAGNOSIS — Z23 Encounter for immunization: Secondary | ICD-10-CM | POA: Diagnosis not present

## 2017-05-10 DIAGNOSIS — J320 Chronic maxillary sinusitis: Secondary | ICD-10-CM | POA: Diagnosis not present

## 2017-05-10 DIAGNOSIS — H8111 Benign paroxysmal vertigo, right ear: Secondary | ICD-10-CM | POA: Diagnosis not present

## 2017-05-10 DIAGNOSIS — R0982 Postnasal drip: Secondary | ICD-10-CM | POA: Diagnosis not present

## 2017-05-10 DIAGNOSIS — R42 Dizziness and giddiness: Secondary | ICD-10-CM

## 2017-05-10 DIAGNOSIS — M810 Age-related osteoporosis without current pathological fracture: Secondary | ICD-10-CM

## 2017-05-10 DIAGNOSIS — H6983 Other specified disorders of Eustachian tube, bilateral: Secondary | ICD-10-CM

## 2017-05-10 DIAGNOSIS — Z1389 Encounter for screening for other disorder: Secondary | ICD-10-CM

## 2017-05-10 DIAGNOSIS — J3089 Other allergic rhinitis: Secondary | ICD-10-CM

## 2017-05-10 DIAGNOSIS — M150 Primary generalized (osteo)arthritis: Secondary | ICD-10-CM | POA: Diagnosis not present

## 2017-05-10 NOTE — Patient Instructions (Addendum)
Please get the Neilmed sinus rinse or Ayr sinus rinses.  Doodle rinses twice daily and follow by one spray each nostril of the Nasacort.  Please take your Claritin or Allegra or Xyzal or Zyrtec-whichever you prefer daily along with the Singulair that the ENT gave you.  It's very important that you take do this regimen every day especially during the allergy season.  Please call your ear, nose and throat doctor since he recommended possible CT to look at your sinuses and head.  Please call and get that scheduled as I think it could be beneficial.  Ask him if PE tubes /  tubes in your tympanic membranes would help with the pressure and pain in ear- this may help relieve vertiginous sx.  Get fasting labs today since they are ordered and you have not obtaining them yet.   How to Treat Vertigo at Home with Exercises What is Vertigo?  Vertigo is a relatively common symptom most often associated with conditions such as sinusitis (inflammation of your sinuses due to viruses, allergies, or bacterial infections), or an inner ear infection or ear trauma.   It can be brought on by trauma (e.g. a blow to the head or whiplash) or more serious things like minor strokes.   Symptoms can also be brought on by normal degenerative changes to your inner ear that occur with aging.  The condition tends to be more commonly seen in the elderly but it can occur in all ages.    Patients most often complain of dizziness, as if the room is spinning around them.   Symptoms are provoked by quick head movements or changes in position like going from standing to lying in bed, or even turning over in bed.   It may present with nausea and/or vomiting, and can be very debilitating to some folks.    By far the most common cause, known as Benign Paroxysmal Positional Vertigo (BPPV), is categorized by a sudden onset of symptoms, that are intense but short-lived (60 seconds or less), which is triggered by a change in head position.    Symptoms usually dissipate if you stay in one position and do not move your head.   Within the inner ear are collections of calcium carbonate crystals referred to as "otoliths" which may become dislodged from their normal position and migrate into the semicircular canals of the inner ear, throwing off your body's ability to sense where you are in space.     Fig. 921 Anatomy of the Right Osseous Labyrinth. Antonieta Iba. Anatomy of the Human Body. 1918.            What Else Could Be Behind My Vertigo?  Some other causes of vertigo include:  Meniere's disease (disorder of inner ear with ringing in ears, feeling of fullness/pressure within ear, and fluctuating hearing loss) Tumours Neurological disorders e.g. Multiple Sclerosis Motion Sickness (lack of coordination between visual stimuli, inner ear balance and positional sense) Migraine Labyrinthitis (inflammation of the fluid-filled tubes and sacs within the inner ear; may also be associated with changes in hearing) Vestibular neuritis (inflammation of the nerves associated with transmission of sensory info from the inner ear; usually of viral origins)  How it can be treated/cured? While certain medications have been prescribed for vertigo including Lorazepam your doing well 7 house the house going organizing and getting things ready for sale with the and Meclizine (for motion sickness), there exists no evidence to support a recommendation of any medication in the routine treatment  of BPPV.  Clinical trials have demonstrated that repositioning techniques (listed below) are a superior option for management Otis Dials et al., 2008).    Figure above:  (A) Instructions for the modified Epley procedure (MEP) for left ear posterior canal benign paroxysmal positional vertigo (PC-BPPV). For right ear BPPV, the procedure has to be performed in the opposite direction, starting with the head turned to the right side.  1. Start by sitting on a bed with your  head turned 45 to the left. Place a pillow behind you so that on lying back it will be under your shoulders.  2. Lie back quickly with shoulders on the pillow, neck extended, and head resting on the bed. In this position, the affected (left) ear is underneath. Wait for 30 secondS.  3. Turn your head 90 to the right (without raising it), and wait again for 30 seconds.  4. Turn your body and head another 90 to the right, and wait for another 30 seconds.  5. Sit up on the right side. This maneuver should be performed three times a day. Repeat this daily until you are free from positional vertigo for 24 hours.   (B) Instructions for the modified Semont maneuver (MSM) for left ear PC-BPPV. For right ear BPPV, the maneuver has to be performed in the opposite direction, starting with the head turned toward the left ear.  1. Sit upright on a bed with your head turned 45 toward the right ear.  2. Drop quickly to the left side, so that your head touches the bed behind your left ear. Wait 30 seconds.  3. Move head and trunk in a swift movement toward the other side without stopping in the upright position, so that your head comes to rest on the right side of your forehead. Wait again for 30 seconds.  4. Sit up again.  This maneuver should be performed three times a day. Repeat this daily until you are free from positional vertigo symptoms for 24 hours.   (   See the video in the supplementary material on the NeurologyWeb site; go to http://www.neurology.org/content/63/1/150/F1.expansion.html   )     You can also try this motion at home as well- Self-Treatment of Benign Paroxysmal Positional Vertigo Benign Paroxysmal Positioning Vertigo is caused by loose inner ear crystals in the inner ear that migrate while sleeping to the back-bottom inner ear balance canal, the so-called "posterior semi-circular canal." The maneuver demonstrated below is the way to reposition the loose crystals so that the symptoms  caused by the loose crystals go away. You may have a floating, swaying sense while walking or sitting for a few days after this procedure.

## 2017-05-10 NOTE — Progress Notes (Signed)
Pt here for an acute care OV today   Impression and Recommendations:    1. ETD (Eustachian tube dysfunction), bilateral ( R.> er L )    2. BPPV (benign paroxysmal positional vertigo), right   3. Maxillary sinusitis, unspecified chronicity   4. Postnasal discharge   5. Environmental and seasonal allergies    - use meclizine that you have if you need it in the future.    Please get the Neilmed sinus rinse or Ayr sinus rinses.  Doodle rinses twice daily and follow by one spray each nostril of the Nasacort.  Please take your Claritin or Allegra or Xyzal or Zyrtec-whichever you prefer daily along with the Singulair that the ENT gave you.  It's very important that you take do this regimen every day especially during the allergy season.  Please call your ear, nose and throat doctor since he recommended possible CT to look at your sinuses and head.  Please call and get that scheduled as I think it could be beneficial.  Ask him if PE tubes /  tubes in your tympanic membranes would help with the pressure and pain in ear- this may help relieve vertiginous sx.  Get fasting labs today since they are ordered and you have not obtaining them yet.   The patient was counseled, risk factors were discussed, anticipatory guidance given.  Gross side effects, risk and benefits, and alternatives of medications and treatment plan in general discussed with patient.  Patient is aware that all medications have potential side effects and we are unable to predict every side effect or drug-drug interaction that may occur.   Patient will call with any questions prior to using medication if they have concerns.  Expresses verbal understanding and consents to current therapy and treatment regimen.  No barriers to understanding were identified.  Red flag symptoms and signs discussed in detail.  Patient expressed understanding regarding what to do in case of emergency\urgent symptoms  Please see AVS handed out to patient  at the end of our visit for further patient instructions/ counseling done pertaining to today's office visit.   Return if symptoms worsen or fail to improve, for also f/up chronic care in couple months.     Note: This document was prepared using Dragon voice recognition software and may include unintentional dictation errors.  Naveed Humphres 9:45 AM --------------------------------------------------------------------------------------------------------------------------------------------------------------------------------------------------------------------------------------------    Subjective:    CC:  Chief Complaint  Patient presents with  . Dizziness    increased headaches    HPI: Leslie Dixon is a 71 y.o. female who presents to [place] [facility] today for issues as discussed below.  HA- on and off for months.  Peri-temporal region on R.  Bigger area of pain than prior but feels same.  No aura like she has with her ocular migraine.   Saw ENT Doc 4-6wks ago- txed her for sinusitis with doxycycline- caused flare of her IBS.  Had to go on prilosec.   - claritin, nasocort, started singular- by the ear nose and throat doctor but patient did not start the medicine yet.  Started again having some mild sx of vertigo- told her she has damage in her R ear- can't hear, which is causing pain and hearing loss.  Only with turning head quickly, but not anywhere near as bad as prior with vertigio where she was falling into walls etc.  She has not been doing the Singulair along with Claritin and Nasacort.  She's  not been doing her exercises for vertigo.  Problem  Maxillary Sinusitis  Postnasal Discharge  Bppv (Benign Paroxysmal Positional Vertigo), Right     Wt Readings from Last 3 Encounters:  05/10/17 136 lb (61.7 kg)  04/22/17 140 lb 11.2 oz (63.8 kg)  02/23/17 138 lb 4.8 oz (62.7 kg)   BP Readings from Last 3 Encounters:  05/10/17 126/73  04/22/17 128/74    02/23/17 (!) 109/58   BMI Readings from Last 3 Encounters:  05/10/17 21.95 kg/m  04/22/17 22.71 kg/m  02/23/17 22.32 kg/m     Patient Care Team    Relationship Specialty Notifications Start End  [name], DO PCP - General Family Medicine  02/20/16   Heath Lark, MD Consulting Physician Hematology and Oncology  01/02/15   Suella Broad, MD Consulting Physician Physical Medicine and Rehabilitation  04/20/16    Comment: lumbar injections  Myrlene Broker, MD Attending Physician Urology  04/20/16   Tyler Pita, MD Referring Physician Physical Medicine and Rehabilitation  04/20/16    Comment: Pain medicine  Katy Apo, MD Consulting Physician Ophthalmology  04/20/16    Comment: uveitis  Juanda Chance, NP Nurse Practitioner Obstetrics and Gynecology  04/20/16   Druscilla Brownie, MD Consulting Physician Dermatology  04/20/16    Comment: Lennie Odor, NP  Carloyn Manner, MD Referring Physician Otolaryngology  02/08/17    Comment: ENT Doc  Renato Shin, MD Consulting Physician Endocrinology  02/23/17   Katy Apo, MD Consulting Physician Ophthalmology  04/22/17    Comment: sees yrly for eye exam- every fall     Patient Active Problem List   Diagnosis Date Noted  . Family history of breast cancer- sister at age 36- new onset 02/23/2017    Priority: High  . Chronic vertigo 09/21/2016    Priority: High  . h/o GAD (had panic in past) 02/12/2016    Priority: High  . Fibromyalgia 03/07/2007    Priority: High  . GERD (gastroesophageal reflux disease)     Priority: Medium  . Osteoporosis     Priority: Medium  . Chronic back pain     Priority: Medium  . LACTOSE INTOLERANCE 03/07/2007    Priority: Medium  . OA (osteoarthritis) 03/07/2007    Priority: Medium  . History of thyroid nodule- R side ( Dr Loanne Drilling eval in 10/17) 02/23/2017    Priority: Low  . ETD (Eustachian tube dysfunction), bilateral  ( R >er L ) 11/19/2016    Priority: Low  . Chronic middle ear  effusion, bilateral ( R >er L ) 11/19/2016    Priority: Low  . Insomnia 07/29/2016    Priority: Low  . History of non anemic vitamin B12 deficiency 05/02/2016    Priority: Low  . Environmental and seasonal allergies 04/20/2016    Priority: Low  . Idiopathic benign Leukopenia     Priority: Low  . Ocular migraine 03/07/2007    Priority: Low  . Maxillary sinusitis 05/10/2017  . Postnasal discharge 05/10/2017  . BPPV (benign paroxysmal positional vertigo), right 05/10/2017  . Status post lumbar surgery- foot drop R foot 02/23/2017  . 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 Presbycusis of both ears 09/30/2016  . Right thyroid nodule 05/28/2016  . Intolerance to cold 05/02/2016  . Encounter for wellness examination 04/20/2016  . Can't get food down 04/10/2012  . Difficulty speaking 04/10/2012  . Hydronephrosis 03/22/2012  . Back pain   . Chronic infection of sinus 07/09/2011  . Blood in the urine 05/06/2011  .  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 Calculus of kidney 05/06/2011  . HYPOKALEMIA 01/03/2008  . Allergic rhinitis due to pollen 12/13/2007  . 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 CARPAL TUNNEL SYNDROME 03/07/2007  . VARICOSE VEIN 03/07/2007  . NEPHROLITHIASIS, HX OF 03/07/2007    Past Medical history, Surgical history, Family history, Social history, Allergies and Medications have been entered into the medical record, reviewed and changed as needed.    Current Meds  Medication Sig  . albuterol (PROVENTIL HFA;VENTOLIN HFA) 108 (90 Base) MCG/ACT inhaler Inhale 1-2 puffs into the lungs every 4 (four) hours as needed for wheezing or shortness of breath (only short term treatment).  . Cholecalciferol (VITAMIN D3) 5000 units TABS 5,000 IU OTC vitamin D3 daily.  Marland Kitchen ibandronate (BONIVA) 150 MG tablet TAKE 1 TABLET BY MOUTH  EVERY 30 DAYS  . loratadine (CLARITIN) 10 MG tablet Take by mouth.  . meloxicam (MOBIC) 7.5 MG tablet Take 1-2 tablets daily for joint pains  . triamcinolone  (NASACORT ALLERGY 24HR) 55 MCG/ACT AERO nasal inhaler Place 1 spray into the nose daily.  . Turmeric 500 MG CAPS Take 2 capsules by mouth daily at 8 pm.  . vitamin B-12 (CYANOCOBALAMIN) 1000 MCG tablet Take 1,000 mcg by mouth daily.  . vitamin C (ASCORBIC ACID) 500 MG tablet Take 500 mg by mouth daily.  . vitamin E 400 UNIT capsule Take 400 Units by mouth daily. Patient takes 2 a day    Allergies:  Allergies  Allergen Reactions  . Peanut Oil Anaphylaxis  . Clarithromycin     REACTION: GI  . Codeine     REACTION: nausea and vomiting  . Lactose     Other reaction(s): GI Upset (intolerance)  . 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 Penicillins     REACTION: per allergy testing/Patient states she has taken Augmentin without complications.   . Pregabalin     REACTION: headache, several side effects  . Sulfa Antibiotics Other (See Comments)    REACTION: Hives, wheezing  . Sulfonamide Derivatives     REACTION: Hives, wheezing  . Cefdinir Rash     Review of Systems: General:   Denies fever, chills, unexplained weight loss.  Optho/Auditory:   Denies visual changes, blurred vision/LOV Respiratory:   Denies wheeze, DOE more than baseline levels.  Cardiovascular:   Denies chest pain, palpitations, new onset peripheral edema  Gastrointestinal:   Denies nausea, vomiting, diarrhea, abd pain.  Genitourinary: Denies dysuria, freq/ urgency, flank pain or discharge from genitals.  Endocrine:     Denies hot or cold intolerance, polyuria, polydipsia. Musculoskeletal:   Denies unexplained myalgias, joint swelling, unexplained arthralgias, gait problems.  Skin:  Denies new onset rash, suspicious lesions Neurological:     Denies dizziness, unexplained weakness, numbness  Psychiatric/Behavioral:   Denies mood changes, suicidal or homicidal ideations, hallucinations    Objective:   Blood pressure 126/73, pulse 66, temperature 98.1 F (36.7 C), height 5'  6" (1.676 m), weight 136 lb (61.7 kg). Body mass index is 21.95 kg/m. General:  Well Developed, well nourished, appropriate for stated age.  Neuro:  Alert and oriented,  extra-ocular muscles intact  HEENT:  Normocephalic, atraumatic, neck supple Skin:  no gross rash, warm, pink. Cardiac:  RRR, S1 S2 Respiratory:  ECTA B/L and A/P, Not using accessory muscles, speaking in full sentences- unlabored. Vascular:  Ext warm, no cyanosis apprec.; cap RF less 2 sec. Psych:  No HI/SI, judgement and insight good,  Euthymic mood. Full Affect.

## 2017-05-11 LAB — LIPID PANEL
CHOL/HDL RATIO: 2.3 ratio (ref 0.0–4.4)
Cholesterol, Total: 196 mg/dL (ref 100–199)
HDL: 85 mg/dL (ref >39)
LDL Calculated: 102 mg/dL — ABNORMAL HIGH (ref 0–99)
Triglycerides: 47 mg/dL (ref 0–149)
VLDL Cholesterol Cal: 9 mg/dL (ref 5–40)

## 2017-05-11 LAB — COMPREHENSIVE METABOLIC PANEL
ALT: 17 IU/L (ref 0–32)
AST: 21 IU/L (ref 0–40)
Albumin/Globulin Ratio: 1.8 (ref 1.2–2.2)
Albumin: 4.4 g/dL (ref 3.5–4.8)
Alkaline Phosphatase: 56 IU/L (ref 39–117)
BUN/Creatinine Ratio: 24 (ref 12–28)
BUN: 16 mg/dL (ref 8–27)
Bilirubin Total: 0.4 mg/dL (ref 0.0–1.2)
CO2: 23 mmol/L (ref 20–29)
Calcium: 9.1 mg/dL (ref 8.7–10.3)
Chloride: 104 mmol/L (ref 96–106)
Creatinine, Ser: 0.66 mg/dL (ref 0.57–1.00)
GFR calc Af Amer: 101 mL/min/{1.73_m2} (ref >59)
GFR calc non Af Amer: 88 mL/min/{1.73_m2} (ref >59)
Globulin, Total: 2.4 g/dL (ref 1.5–4.5)
Glucose: 83 mg/dL (ref 65–99)
Potassium: 3.9 mmol/L (ref 3.5–5.2)
Sodium: 142 mmol/L (ref 134–144)
Total Protein: 6.8 g/dL (ref 6.0–8.5)

## 2017-05-11 LAB — VITAMIN D 25 HYDROXY (VIT D DEFICIENCY, FRACTURES): Vit D, 25-Hydroxy: 50.7 ng/mL (ref 30.0–100.0)

## 2017-05-11 LAB — CBC WITH DIFFERENTIAL/PLATELET
BASOS ABS: 0 10*3/uL (ref 0.0–0.2)
Basos: 1 %
EOS (ABSOLUTE): 0.1 10*3/uL (ref 0.0–0.4)
Eos: 2 %
Hematocrit: 43.2 % (ref 34.0–46.6)
Hemoglobin: 14.3 g/dL (ref 11.1–15.9)
Immature Grans (Abs): 0 10*3/uL (ref 0.0–0.1)
Immature Granulocytes: 0 %
LYMPHS ABS: 1.7 10*3/uL (ref 0.7–3.1)
LYMPHS: 39 %
MCH: 29.8 pg (ref 26.6–33.0)
MCHC: 33.1 g/dL (ref 31.5–35.7)
MCV: 90 fL (ref 79–97)
Monocytes Absolute: 0.3 10*3/uL (ref 0.1–0.9)
Monocytes: 7 %
NEUTROS ABS: 2.2 10*3/uL (ref 1.4–7.0)
Neutrophils: 51 %
PLATELETS: 200 10*3/uL (ref 150–379)
RBC: 4.8 x10E6/uL (ref 3.77–5.28)
RDW: 16.1 % — AB (ref 12.3–15.4)
WBC: 4.2 10*3/uL (ref 3.4–10.8)

## 2017-05-11 LAB — TSH: TSH: 2.3 u[IU]/mL (ref 0.450–4.500)

## 2017-05-11 LAB — MAGNESIUM: Magnesium: 2.4 mg/dL — ABNORMAL HIGH (ref 1.6–2.3)

## 2017-05-11 LAB — HEMOGLOBIN A1C
Est. average glucose Bld gHb Est-mCnc: 103 mg/dL
HEMOGLOBIN A1C: 5.2 % (ref 4.8–5.6)

## 2017-05-11 LAB — PHOSPHORUS: Phosphorus: 2.9 mg/dL (ref 2.5–4.5)

## 2017-05-11 LAB — VITAMIN B12: Vitamin B-12: 813 pg/mL (ref 232–1245)

## 2017-05-24 ENCOUNTER — Telehealth: Payer: Self-pay | Admitting: Family Medicine

## 2017-05-24 NOTE — Telephone Encounter (Signed)
Patient is requesting a referral to see Dr. Hyman Bower with [facility]. Phone number for office is [phone] and fax is [phone]. This is for ongoing neck pain, she has seen him in the past.

## 2017-05-26 ENCOUNTER — Other Ambulatory Visit: Payer: Self-pay

## 2017-05-26 DIAGNOSIS — M542 Cervicalgia: Secondary | ICD-10-CM

## 2017-05-26 NOTE — Telephone Encounter (Signed)
Referral was placed for Dr. Bjorn Loser per approval from Dr Raliegh Scarlet.  Patient will be contacted with appointment information. MPulliam, CMA/RT(R)

## 2017-05-27 ENCOUNTER — Ambulatory Visit (INDEPENDENT_AMBULATORY_CARE_PROVIDER_SITE_OTHER): Payer: 59 | Admitting: Endocrinology

## 2017-05-27 ENCOUNTER — Encounter: Payer: Self-pay | Admitting: Endocrinology

## 2017-05-27 VITALS — BP 122/58 | HR 68 | Wt 136.4 lb

## 2017-05-27 DIAGNOSIS — E041 Nontoxic single thyroid nodule: Secondary | ICD-10-CM | POA: Diagnosis not present

## 2017-05-27 NOTE — Progress Notes (Signed)
Subjective:    Patient ID: Leslie Dixon, female    DOB: November 16, 1945, 71 y.o.   MRN: [id number]  HPI Pt returns for f/u of thyroid nodule (dx'ed 2017; was not big enough for bx; she is euthyroid off rx). she says she sometimes notices the thyroid nodule Past Medical History:  Diagnosis Date  . Blood dyscrasia    LEUKOPENIA- DR. CHISM - NO TREATMENT BUT IS MONITORED  . Chronic back pain   . Chronic back pain   . Fibromyalgia   . Foot drop, right    RELATED TO LUMBAR PROBLEMS  . GERD (gastroesophageal reflux disease)    PRILOSEC PRN  . History of kidney stones   . Horseshoe kidney    BILATERAL  . Leukopenia   . Migraine    OCCULAR MIGRAINES - AURA WITH EYES  . Osteoporosis   . Pain    LUMBAR PAIN - RECENT FALL BECAUSE LEGS GAVE OUT - PT HAS HAD LUMBAR INJECTION SINCE THE FALL THAT HAS HELPED BACK PAIN  . Pain    CHRONIC RIGHT UPPER QUADRANT PAIN - RELATED TO GALLBLADDER PROBLEM  . Sciatica of right side   . Sinusitis    f/u with Dr. Janace Hoard.   . Vertigo     Past Surgical History:  Procedure Laterality Date  . ABDOMINAL HYSTERECTOMY  1983  . APPENDECTOMY  1968  . BILATERAL SALPINGOOPHORECTOMY  2000  . BREAST EXCISIONAL BIOPSY Right 1979  . CHOLECYSTECTOMY N/A 04/12/2014   Procedure: LAPAROSCOPIC CHOLECYSTECTOMY WITH INTRAOPERATIVE CHOLANGIOGRAM;  Surgeon: Kaylyn Lim, MD;  Location: [facility];  Service: General;  Laterality: N/A;  . Lumbar/cervical surgeries     CERVICAL FUSION C7-C6 WITH BONE GRAFT; C6-C5 WITH PLATE AND 4 SCREWS;  3 LUMBAR SURGERIES BUT NO FUSION  . NASAL SINUS SURGERY  2010  . right knee     ARTHROSCOPY  . WISDOM TOOTH PULLED      Social History   Social History  . Marital status: Married    Spouse name: N/A  . Number of children: 2  . Years of education: N/A   Occupational History  .      retired Press photographer   Social History Main Topics  . Smoking status: Never Smoker  . Smokeless tobacco: Never Used  . Alcohol use No     Comment:  occassionally  . Drug use: No  . Sexual activity: Yes    Birth control/ protection: None   Other Topics Concern  . Not on file   Social History Narrative  . No narrative on file    Current Outpatient Prescriptions on File Prior to Visit  Medication Sig Dispense Refill  . albuterol (PROVENTIL HFA;VENTOLIN HFA) 108 (90 Base) MCG/ACT inhaler Inhale 1-2 puffs into the lungs every 4 (four) hours as needed for wheezing or shortness of breath (only short term treatment). 1 Inhaler 1  . Cholecalciferol (VITAMIN D3) 5000 units TABS 5,000 IU OTC vitamin D3 daily. 90 tablet 3  . ibandronate (BONIVA) 150 MG tablet TAKE 1 TABLET BY MOUTH  EVERY 30 DAYS 3 tablet 1  . loratadine (CLARITIN) 10 MG tablet Take by mouth.    . meloxicam (MOBIC) 7.5 MG tablet Take 1-2 tablets daily for joint pains 180 tablet 1  . triamcinolone (NASACORT ALLERGY 24HR) 55 MCG/ACT AERO nasal inhaler Place 1 spray into the nose daily.    . Turmeric 500 MG CAPS Take 2 capsules by mouth daily at 8 pm.    . vitamin B-12 (CYANOCOBALAMIN)  1000 MCG tablet Take 1,000 mcg by mouth daily.    . vitamin C (ASCORBIC ACID) 500 MG tablet Take 500 mg by mouth daily.    . vitamin E 400 UNIT capsule Take 400 Units by mouth daily. Patient takes 2 a day     No current facility-administered medications on file prior to visit.     Allergies  Allergen Reactions  . Peanut Oil Anaphylaxis  . Clarithromycin     REACTION: GI  . Codeine     REACTION: nausea and vomiting  . Lactose     Other reaction(s): GI Upset (intolerance)  . 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 Penicillins     REACTION: per allergy testing/Patient states she has taken Augmentin without complications.   . Pregabalin     REACTION: headache, several side effects  . Sulfa Antibiotics Other (See Comments)    REACTION: Hives, wheezing  . Sulfonamide Derivatives     REACTION: Hives, wheezing  . Cefdinir Rash    Family History    Problem Relation Age of Onset  . Cancer Father        Lung  . Neurodegenerative disease Mother   . Cancer Brother        prostate  . Cancer Maternal Aunt        breast cancer  . Breast cancer Maternal Aunt 66  . Cancer Maternal Grandfather        Leukemia  . Migraines Sister   . Breast cancer Sister 50  . Thyroid disease Daughter     BP (!) 122/58   Pulse 68   Wt 136 lb 6.4 oz (61.9 kg)   SpO2 98%   BMI 22.02 kg/m   Review of Systems Denies neck pain    Objective:   Physical Exam VITAL SIGNS:  See vs page GENERAL: no distress NECK: a healed scar is present.  I do not appreciate a nodule in the thyroid or elsewhere in the neck.   Lab Results  Component Value Date   TSH 2.300 05/10/2017   T4TOTAL 7.7 01/14/2016       Assessment & Plan:  Thyroid nodule: nonpalpable  Patient Instructions  It is very unlikely that the thyroid is anything to worry about.  No thyroid treatment is needed now. Please come back for a follow-up appointment in 1 year.

## 2017-05-27 NOTE — Patient Instructions (Addendum)
It is very unlikely that the thyroid is anything to worry about.  No thyroid treatment is needed now. Please come back for a follow-up appointment in 1 year.

## 2017-06-03 NOTE — Progress Notes (Deleted)
Pt came in for pneumococcal vaccine, however she has had both Pneumovax 23 and Prevnar 13.  Therefore she does not need this vaccine.  Pt informed and expressed understanding.  Charyl Bigger, CMA This encounter was created in error - please disregard.

## 2017-06-08 NOTE — Progress Notes (Signed)
Note from 06/03/17 copied into chart in an effort to close encounter.  Charyl Bigger, CMA  Pt came in for pneumococcal vaccine, however she has had both Pneumovax 23 and Prevnar 13.  Therefore she does not need this vaccine.  Pt informed and expressed understanding.  Charyl Bigger, CMA This encounter was created in error - please disregard.

## 2017-06-13 DIAGNOSIS — H25011 Cortical age-related cataract, right eye: Secondary | ICD-10-CM | POA: Diagnosis not present

## 2017-06-13 DIAGNOSIS — H5202 Hypermetropia, left eye: Secondary | ICD-10-CM | POA: Diagnosis not present

## 2017-06-13 DIAGNOSIS — H2511 Age-related nuclear cataract, right eye: Secondary | ICD-10-CM | POA: Diagnosis not present

## 2017-06-28 DIAGNOSIS — M549 Dorsalgia, unspecified: Secondary | ICD-10-CM | POA: Diagnosis not present

## 2017-06-28 DIAGNOSIS — G43109 Migraine with aura, not intractable, without status migrainosus: Secondary | ICD-10-CM | POA: Diagnosis not present

## 2017-07-05 ENCOUNTER — Other Ambulatory Visit: Payer: Self-pay | Admitting: Family Medicine

## 2017-07-20 DIAGNOSIS — G43109 Migraine with aura, not intractable, without status migrainosus: Secondary | ICD-10-CM | POA: Diagnosis not present

## 2017-07-21 ENCOUNTER — Telehealth: Payer: Self-pay

## 2017-07-21 NOTE — Telephone Encounter (Signed)
Rx request for Meloxicam 7.5 mg Last OV 05/10/2017 No new OV  Last Refilled 02/23/2017 Please advise refill

## 2017-07-21 NOTE — Telephone Encounter (Signed)
Okay to refill, #90, no rf

## 2017-07-22 MED ORDER — MELOXICAM 7.5 MG PO TABS: ORAL_TABLET | ORAL | 0 refills | Status: DC

## 2017-07-22 NOTE — Telephone Encounter (Signed)
Meloxicam 7.5 MG refilled to [facility] for #180 with no refills

## 2017-07-26 HISTORY — PX: TYMPANOSTOMY TUBE PLACEMENT: SHX32

## 2017-07-27 DIAGNOSIS — H6981 Other specified disorders of Eustachian tube, right ear: Secondary | ICD-10-CM | POA: Diagnosis not present

## 2017-07-30 IMAGING — MR MR CERVICAL SPINE W/O CM
5 series · 29 of 48 positions shown · non-contrast
Comparison: MRI 10/23/2014. Plain films 02/15/2013. MRI 02/13/2013.

CLINICAL DATA: Chronic neck and BILATERAL shoulder pain with
weakness and numbness. Symptoms worsening. Prior surgery.

EXAM:
MRI CERVICAL SPINE WITHOUT CONTRAST
TECHNIQUE: Multiplanar, multisequence MR imaging of the cervical spine was
performed. No intravenous contrast was administered.

[Series 4: T1 · sagittal · 3.0mm · 0.41mm/px · 6 of 12 slices shown]
[im 1/12]
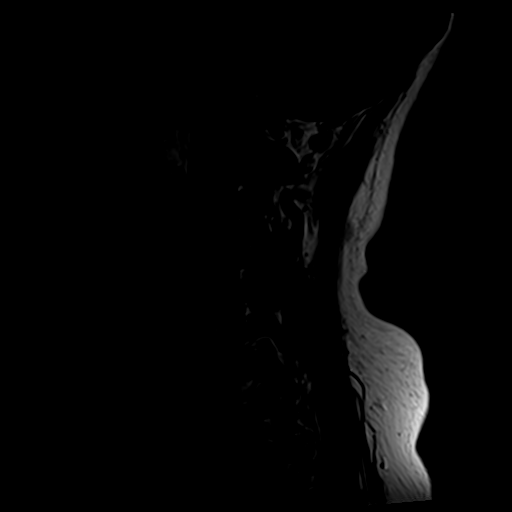
[im 3/12]
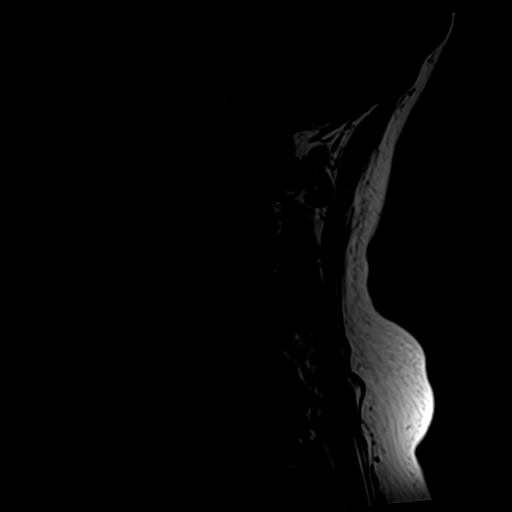
[im 5/12]
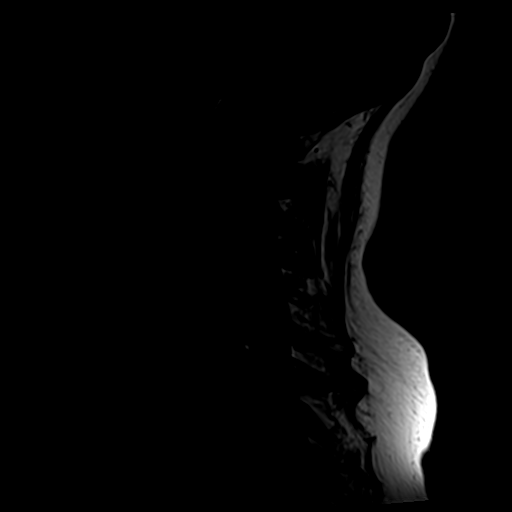
[im 7/12]
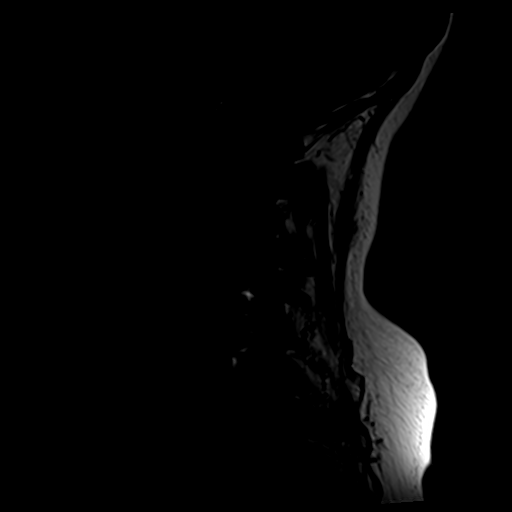
[im 9/12]
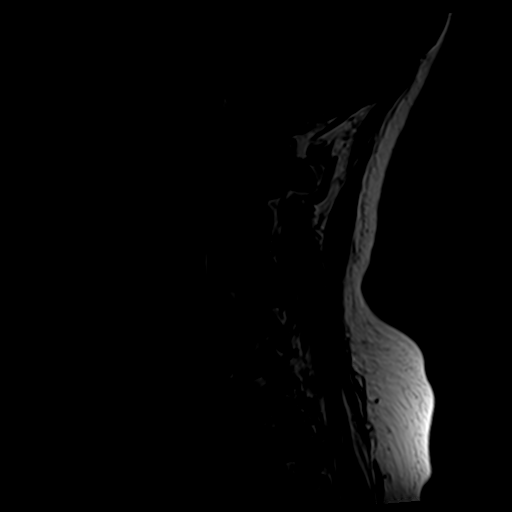
[im 12/12]
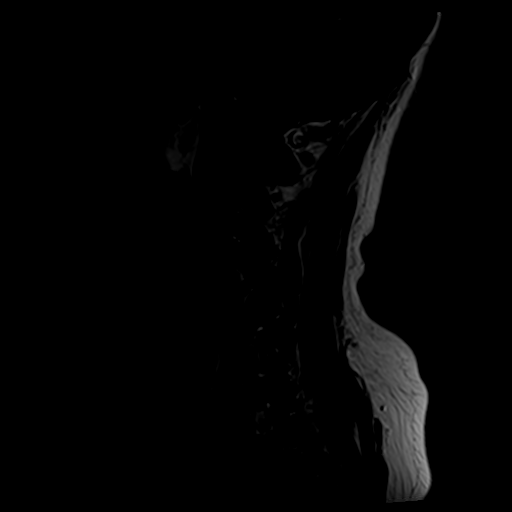

[Series 6: tir sag · sagittal · 3.0mm · 0.41mm/px · 6 of 12 slices shown]
[im 1/12]
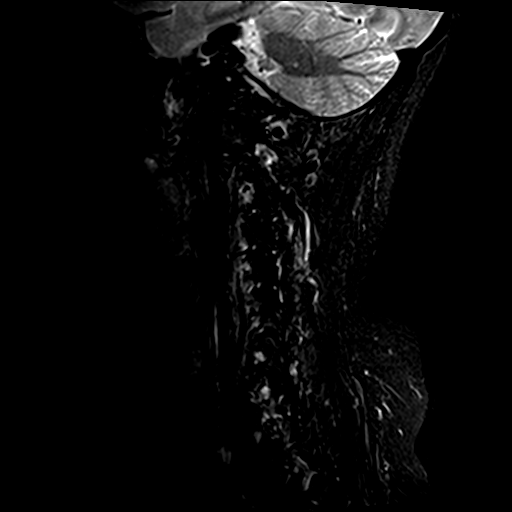
[im 3/12]
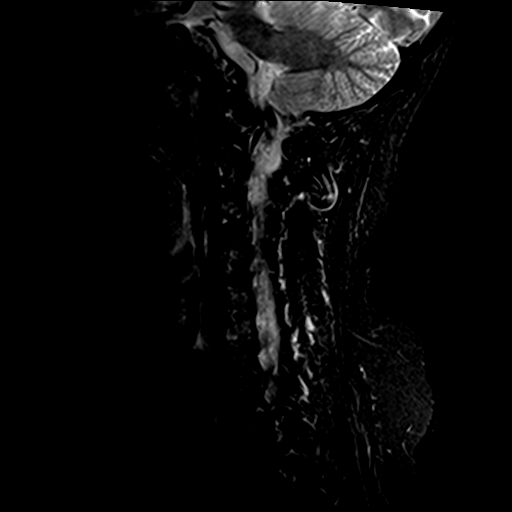
[im 5/12]
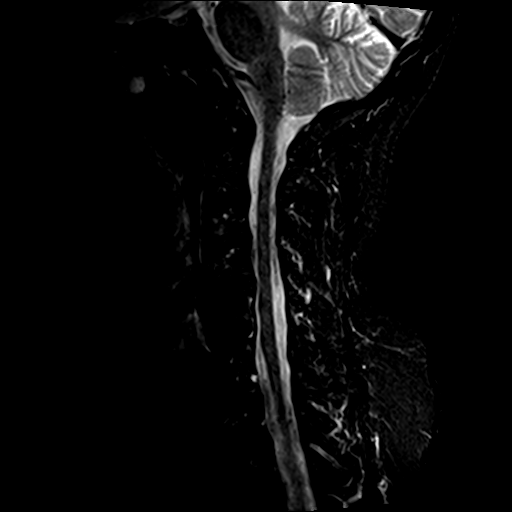
[im 7/12]
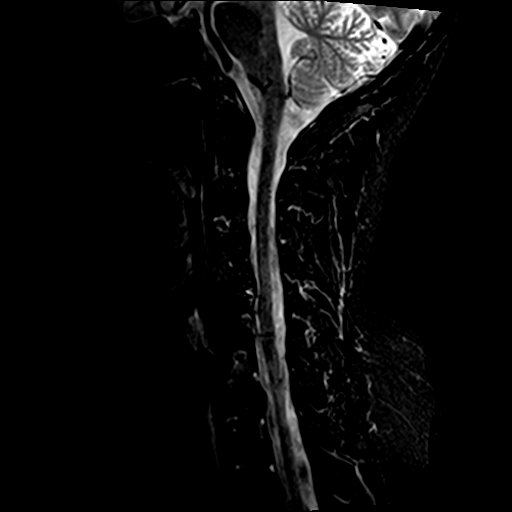
[im 9/12]
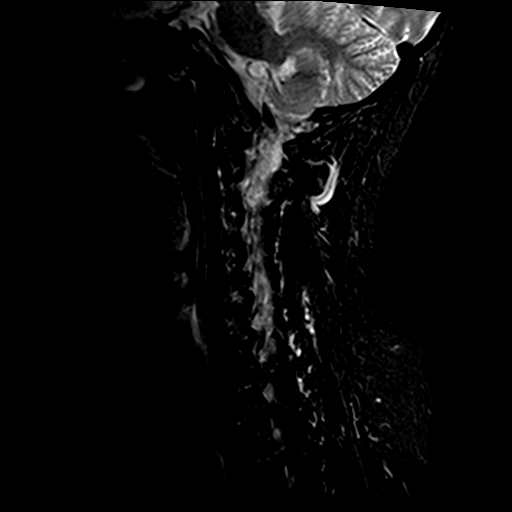
[im 12/12]
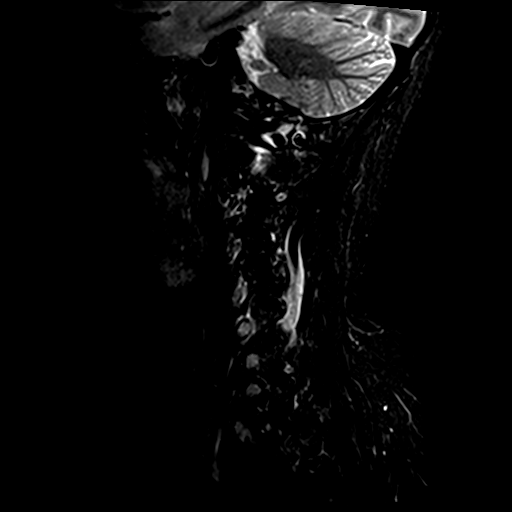

[Series 7: GRE · axial · 3.0mm · 0.35mm/px · z∈[-70,-59]mm · 2 of 28 slices shown]
[im 1/28]
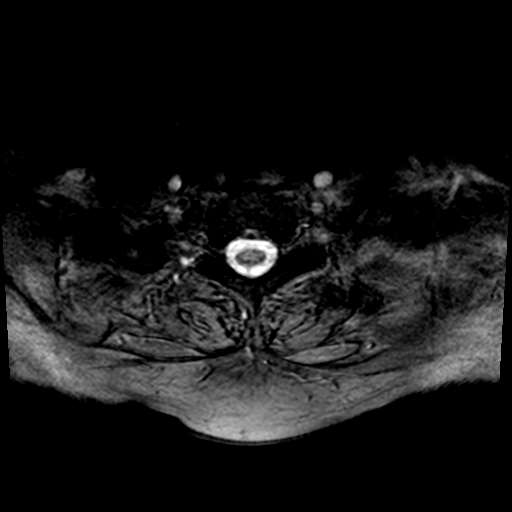
[im 4/28]
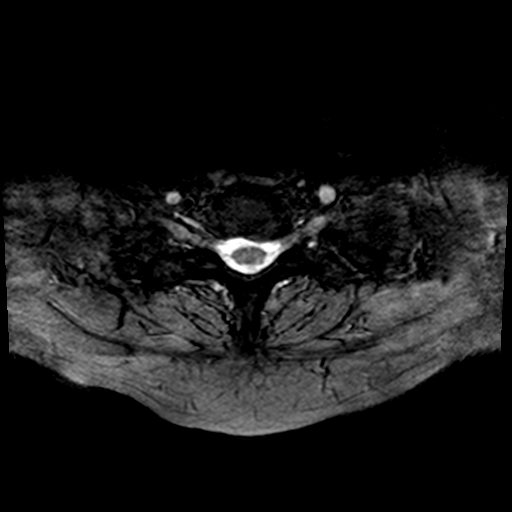

[Series 9: T2 · sagittal · 3.0mm · 0.66mm/px · 6 of 12 slices shown (1 of 2)]
[im 1/12]
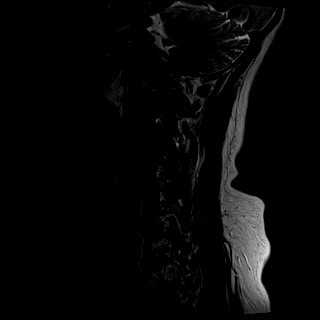
[im 3/12]
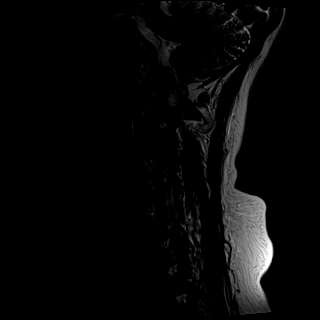
[im 5/12]
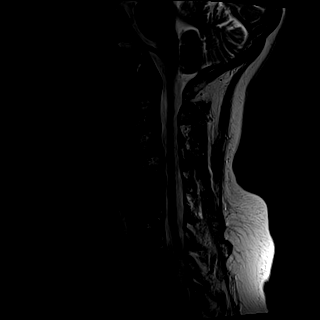
[im 7/12]
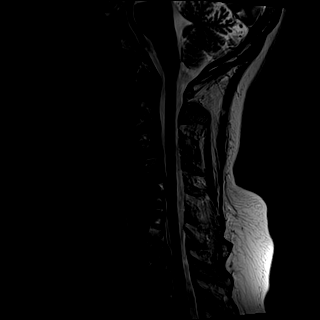
[im 9/12]
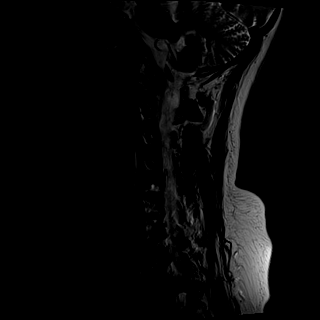
[im 12/12]
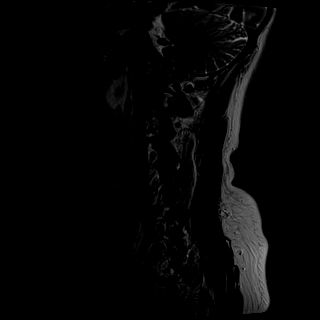

[Series 10: T2 · axial · 3.0mm · 0.70mm/px · z∈[-70,+30]mm · 9 of 28 slices shown (2 of 2)]
[im 1/28]
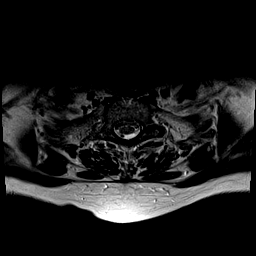
[im 4/28]
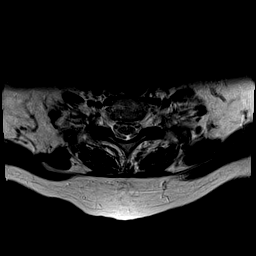
[im 8/28]
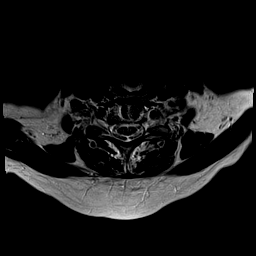
[im 12/28]
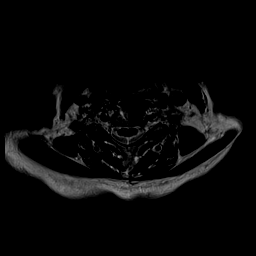
[im 14/28]
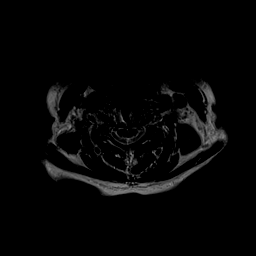
[im 16/28]
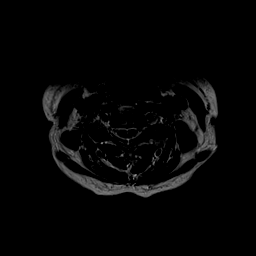
[im 20/28]
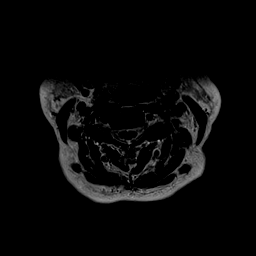
[im 24/28]
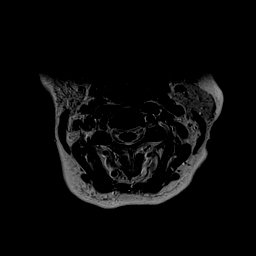
[im 28/28]
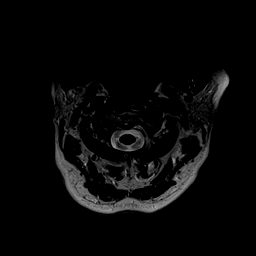

[29 of 48 positions shown; findings below may reference images not displayed]

FINDINGS: Alignment: Straightening of the normal cervical lordosis. Trace
anterolisthesis C4-5.

Vertebrae: Previous instrumented fusion at C5-6 appears solid.
Previous non-instrumented fusion at C6-7 also appears solid. No
worrisome osseous lesion above or below the fusion segments.

Cord: No compression or abnormal signal.

Posterior Fossa, vertebral arteries, paraspinal tissues: Negative

Disc levels:

C2-3:  Unremarkable.

C3-4: Uncinate spurring on the RIGHT. Potentially symptomatic RIGHT
C4 foraminal narrowing.

C4-5: Shallow disc osteophyte complex. Trace anterolisthesis. No
impingement.

C5-6: Metallic artifact. Solid appearing fusion. No definite
residual impingement.

C6-7:  Solid appearing fusion.  No definite impingement.

C7-T1: Central and leftward protrusion. LEFT C8 nerve root
impingement not excluded.

Compared with previous scan from 9005, similar appearance.
IMPRESSION: Mild adjacent segment disease related to previous C5-C7 ACDF.

Foraminal narrowing at C3-4 on the RIGHT, and C7-T1 on the LEFT
could affect the exiting nerve roots at those levels. Similar
appearance to 9005.

## 2017-08-02 IMAGING — US US SOFT TISSUE HEAD/NECK
1 series · 13 of 25 positions shown · non-contrast
Comparison: None.

CLINICAL DATA: Palpable abnormality.  Left neck fullness.

EXAM:
THYROID ULTRASOUND
TECHNIQUE: Ultrasound examination of the thyroid gland and adjacent soft
tissues was performed.

[Series 1: us soft tissue head/neck · 0.06mm/px · 13 of 36 slices shown]
[im 1/36]
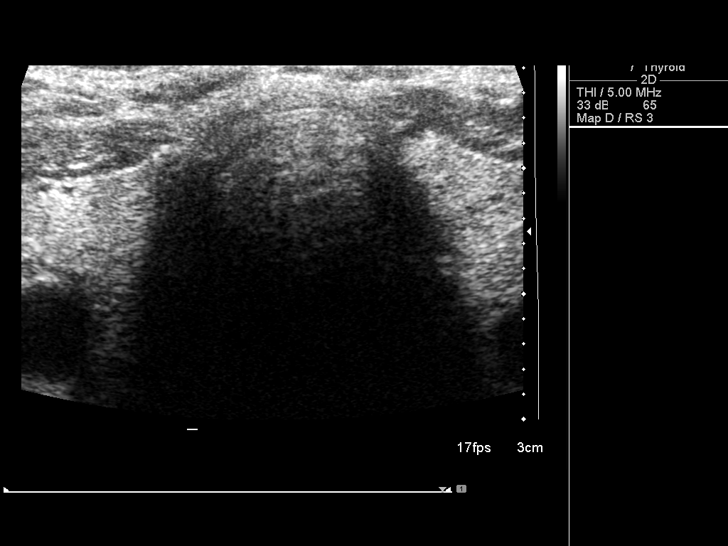
[im 3/36]
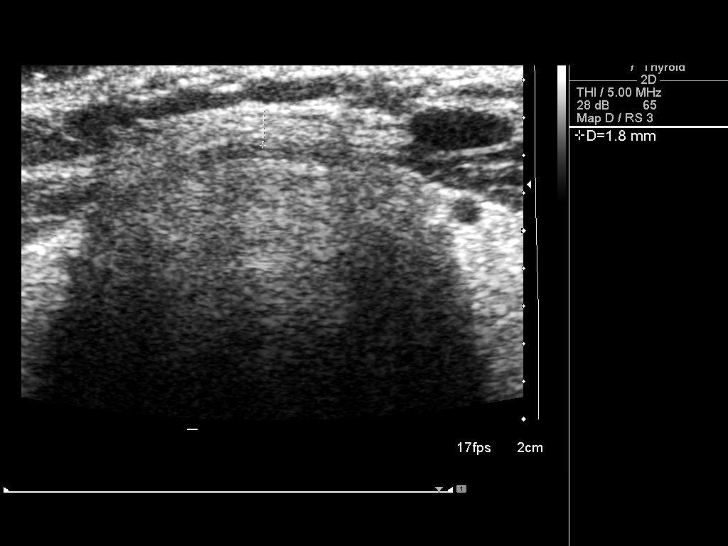
[im 6/36]
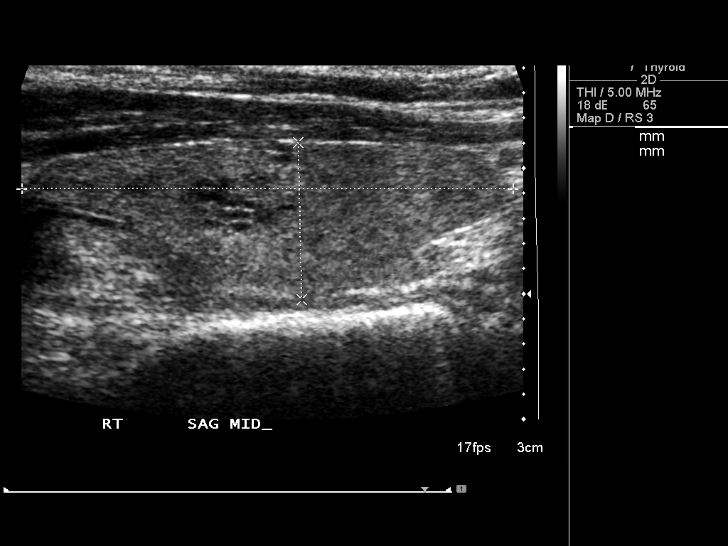
[im 9/36]
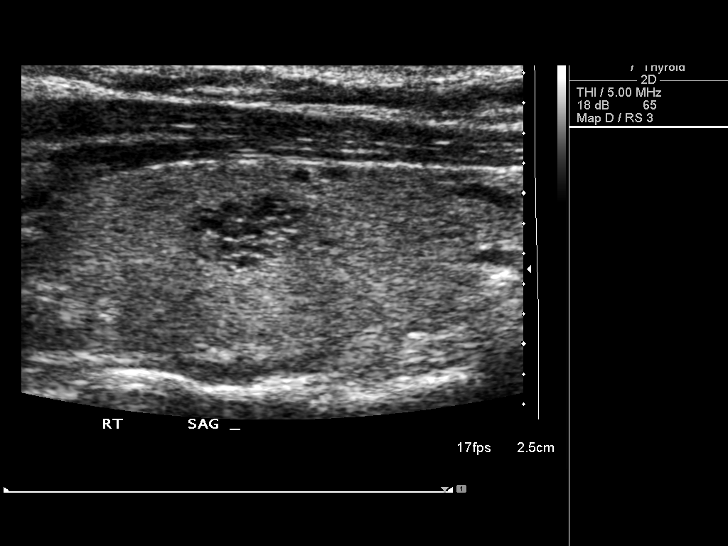
[im 12/36]
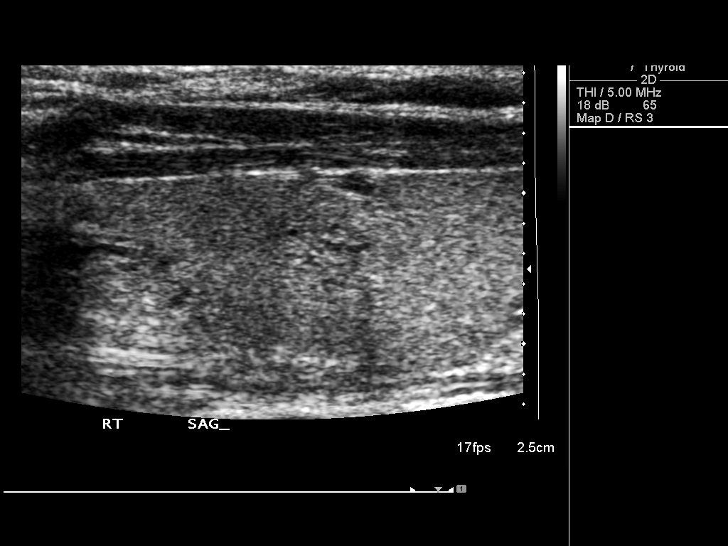
[im 15/36]
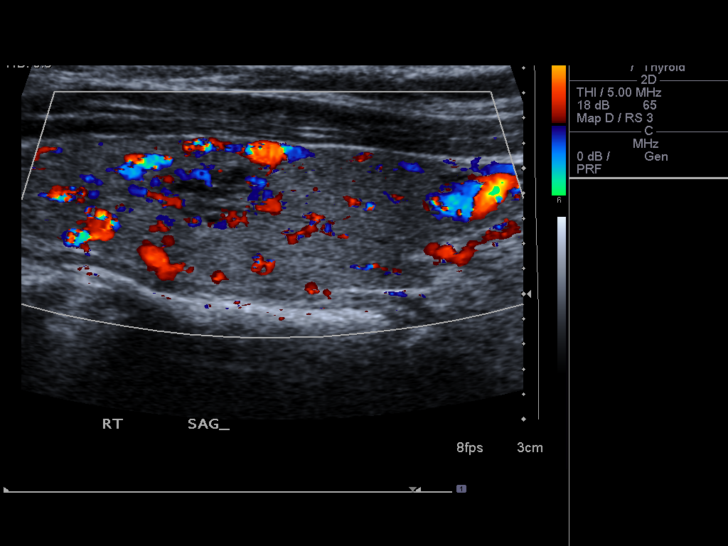
[im 18/36]
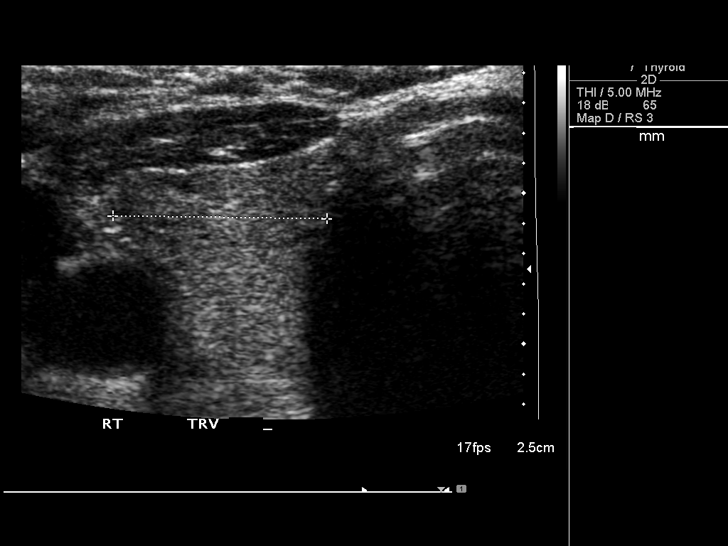
[im 21/36]
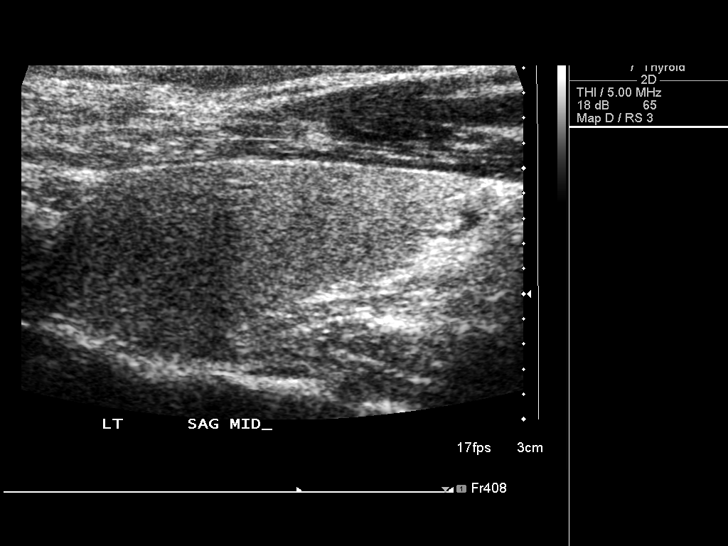
[im 24/36]
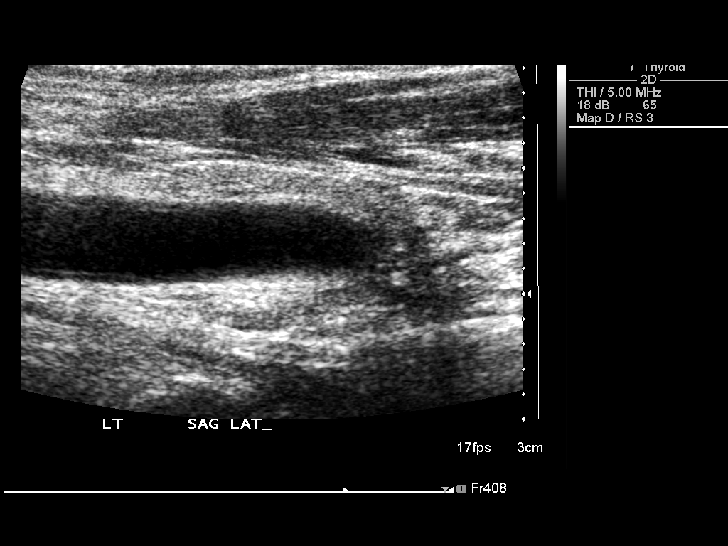
[im 27/36]
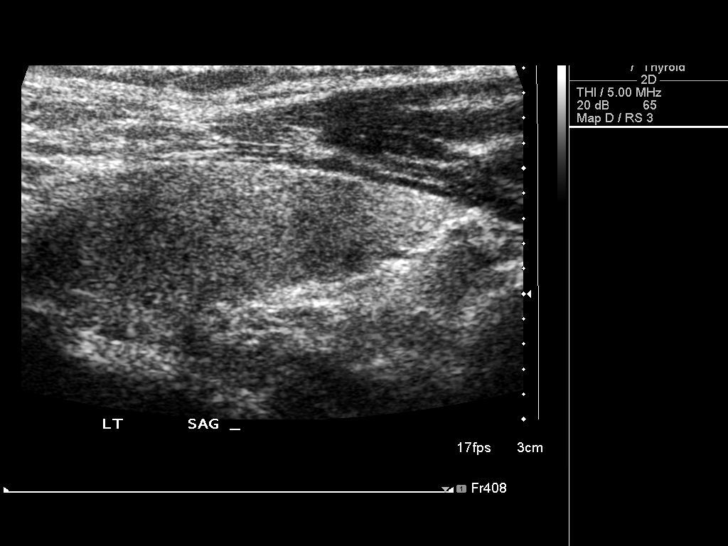
[im 30/36]
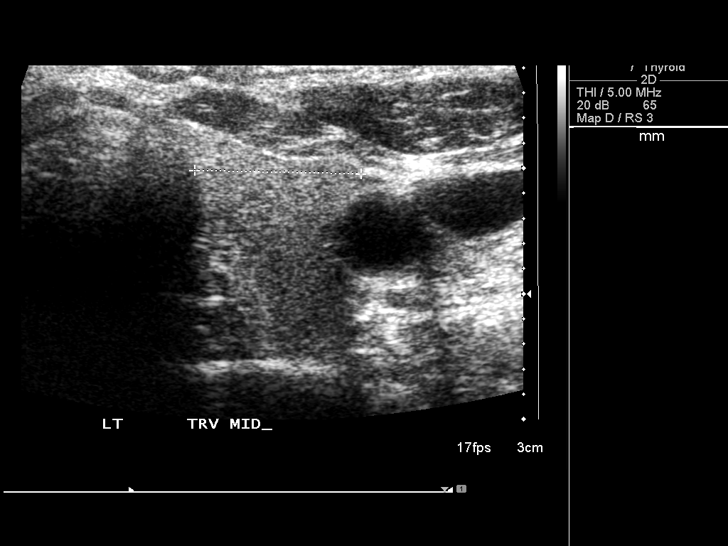
[im 33/36]
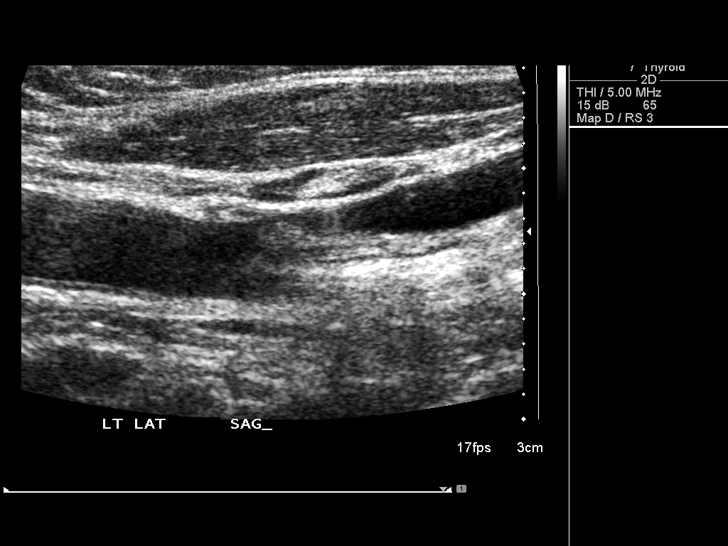
[im 36/36]
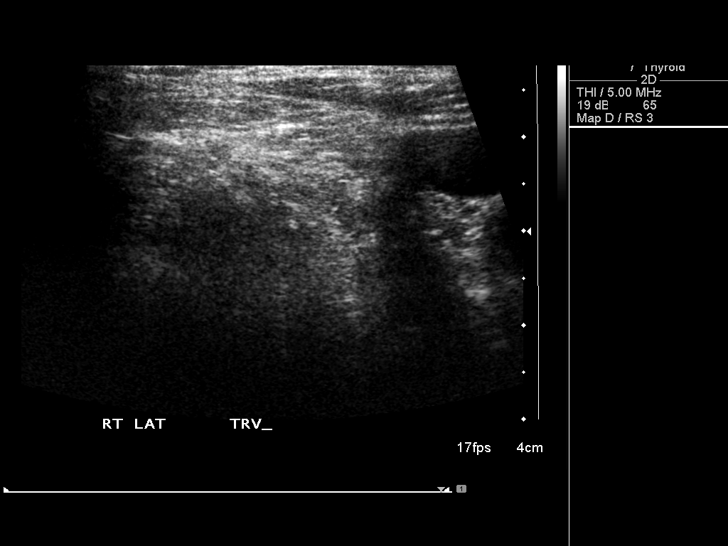

[13 of 25 positions shown; findings below may reference images not displayed]

FINDINGS: Parenchymal Echotexture: Mildly heterogenous

Estimated total number of nodules >/= 1 cm: 0

Number of spongiform nodules >/=  2 cm not described below (TR1): 0

Number of mixed cystic and solid nodules >/= 1.5 cm not described
below (TR2): 0

_________________________________________________________

Isthmus: Measures 0.2 cm in thickness.

No discrete nodules are identified within the thyroid isthmus.

_________________________________________________________

Right lobe: Measures 3.9 x 1.3 x 1.4 cm.

There is a spongiform nodule in the mid right thyroid lobe measuring
up to 0.8 cm. No other right thyroid nodules.

_________________________________________________________

Left lobe: Measures 3.7 x 1.6 x 1.3 cm.

No discrete nodules are identified within the left lobe of the
thyroid.
IMPRESSION: Solitary right thyroid nodule measuring 0.8 cm. This is a spongiform
nodule and does not meet criteria for biopsy or dedicated follow-up.

The above is in keeping with the ACR TI-RADS recommendations - [HOSPITAL] 2059;[DATE].

## 2017-08-12 DIAGNOSIS — G43109 Migraine with aura, not intractable, without status migrainosus: Secondary | ICD-10-CM | POA: Diagnosis not present

## 2017-08-15 DIAGNOSIS — H6983 Other specified disorders of Eustachian tube, bilateral: Secondary | ICD-10-CM | POA: Diagnosis not present

## 2017-09-05 DIAGNOSIS — H6982 Other specified disorders of Eustachian tube, left ear: Secondary | ICD-10-CM | POA: Diagnosis not present

## 2017-09-05 DIAGNOSIS — J320 Chronic maxillary sinusitis: Secondary | ICD-10-CM | POA: Diagnosis not present

## 2017-09-13 ENCOUNTER — Other Ambulatory Visit: Payer: Self-pay | Admitting: Family Medicine

## 2017-09-29 ENCOUNTER — Ambulatory Visit (INDEPENDENT_AMBULATORY_CARE_PROVIDER_SITE_OTHER): Payer: 59 | Admitting: Family Medicine

## 2017-09-29 ENCOUNTER — Encounter: Payer: Self-pay | Admitting: Family Medicine

## 2017-09-29 VITALS — BP 106/68 | HR 76 | Ht 66.0 in | Wt 138.0 lb

## 2017-09-29 DIAGNOSIS — G8929 Other chronic pain: Secondary | ICD-10-CM

## 2017-09-29 DIAGNOSIS — F411 Generalized anxiety disorder: Secondary | ICD-10-CM | POA: Diagnosis not present

## 2017-09-29 DIAGNOSIS — K580 Irritable bowel syndrome with diarrhea: Secondary | ICD-10-CM | POA: Diagnosis not present

## 2017-09-29 DIAGNOSIS — M1990 Unspecified osteoarthritis, unspecified site: Secondary | ICD-10-CM

## 2017-09-29 DIAGNOSIS — K219 Gastro-esophageal reflux disease without esophagitis: Secondary | ICD-10-CM | POA: Diagnosis not present

## 2017-09-29 DIAGNOSIS — M797 Fibromyalgia: Secondary | ICD-10-CM | POA: Diagnosis not present

## 2017-09-29 DIAGNOSIS — G43909 Migraine, unspecified, not intractable, without status migrainosus: Secondary | ICD-10-CM | POA: Insufficient documentation

## 2017-09-29 DIAGNOSIS — M549 Dorsalgia, unspecified: Secondary | ICD-10-CM | POA: Diagnosis not present

## 2017-09-29 DIAGNOSIS — R1010 Upper abdominal pain, unspecified: Secondary | ICD-10-CM | POA: Diagnosis not present

## 2017-09-29 DIAGNOSIS — E876 Hypokalemia: Secondary | ICD-10-CM | POA: Diagnosis not present

## 2017-09-29 DIAGNOSIS — E739 Lactose intolerance, unspecified: Secondary | ICD-10-CM | POA: Diagnosis not present

## 2017-09-29 DIAGNOSIS — R110 Nausea: Secondary | ICD-10-CM | POA: Diagnosis not present

## 2017-09-29 DIAGNOSIS — Z87442 Personal history of urinary calculi: Secondary | ICD-10-CM | POA: Diagnosis not present

## 2017-09-29 MED ORDER — CYCLOBENZAPRINE HCL 5 MG PO TABS: 5.0000 mg | ORAL_TABLET | Freq: Every day | ORAL | 1 refills | Status: DC

## 2017-09-29 MED ORDER — MELOXICAM 7.5 MG PO TABS: ORAL_TABLET | ORAL | 1 refills | Status: DC

## 2017-09-29 MED ORDER — OMEPRAZOLE 20 MG PO CPDR: 20.0000 mg | DELAYED_RELEASE_CAPSULE | Freq: Two times a day (BID) | ORAL | 3 refills | Status: DC

## 2017-09-29 NOTE — Progress Notes (Signed)
Impression and Recommendations:    1. h/o GAD (had panic in past)   2. Fibromyalgia   3. Irritable bowel syndrome with diarrhea   4. LACTOSE INTOLERANCE   5. Gastroesophageal reflux disease, esophagitis presence not specified   6. Chronic back pain, unspecified back location, unspecified back pain laterality- NOW NECK   7. Migraine without status migrainosus, not intractable, unspecified migraine type   8. Osteoarthritis, unspecified osteoarthritis type, unspecified site   9. Pain of upper abdomen   10. Nausea     1. H/o GAD- pt reports recent stresses regarding her husband. She declines meds at this time.  -recommend daily exercise to help with stress.  2. Fibromyalgia-  -start flexeril (5mg ) to take at night.  -Get plenty of rest. -exercise daily.  3. GERD-go back on OTC prilosec BID.  4. Lactose intolerance- avoid foods that upset your stomach, including dairy products.  5. Chronic back pain -Pt sees Dr. Nelva Bush, orthopedics, who will consider doing injections in future if needed.  -can increase her mobic to 15mg  daily.  6. IBS with diarrhea--We will not do any imaging today as she has a future appointment with [facility]. -Keep your future appointment with Dr. Oletta Lamas, GI.  -Increase your apple cider vinegar to 1 tbsp, up from 2 tsp. -stay away from foods that upset your stomach.  -order labs today. -As pt is requesting a new GI doctor, told her to call their office and ask if she can see a different provider. If not, we will refer her to another provider in the future.   7. Migraine without status migranosus- Per pt's recent referral to neurology, she was prescribed sumatriptan to take PRN. Pt is tolerating this meds well and will continue as described below. Sx stable at this time.   8. Osteoarthritis-  --take double dose of meloxicam (up to 15mg ) as long as it does not upset your stomach.  9. Pain of upper abdomen- resolved currently but will obtain  labs  10. Nausea- resolved currently but will obtain labs.    Orders Placed This Encounter  Procedures  . CBC with Differential/Platelet  . Comprehensive metabolic panel  . Lipase  . Gamma GT  . Bilirubin, direct  . Bilirubin, total  . Amylase    Meds ordered this encounter  Medications  . omeprazole (PRILOSEC) 20 MG capsule    Sig: Take 1 capsule (20 mg total) by mouth 2 (two) times daily before a meal.    Dispense:  30 capsule    Refill:  3  . cyclobenzaprine (FLEXERIL) 5 MG tablet    Sig: Take 1 tablet (5 mg total) by mouth at bedtime. Only as needed muscle spasm    Dispense:  30 tablet    Refill:  1  . meloxicam (MOBIC) 7.5 MG tablet    Sig: Take 1-2 tablets daily for joint pains    Dispense:  180 tablet    Refill:  1    Gross side effects, risk and benefits, and alternatives of medications and treatment plan in general discussed with patient.  Patient is aware that all medications have potential side effects and we are unable to predict every side effect or drug-drug interaction that may occur.   Patient will call with any questions prior to using medication if they have concerns.  Expresses verbal understanding and consents to current therapy and treatment regimen.  No barriers to understanding were identified.  Red flag symptoms and signs discussed in detail.  Patient expressed understanding regarding what to do in case of emergency\urgent symptoms  Please see AVS handed out to patient at the end of our visit for further patient instructions/ counseling done pertaining to today's office visit.   Return in about 3 months (around 12/30/2017) for f/up GI issues and arthritis / muscle pains, sooner if concerns.    Note: This note was prepared with assistance of Dragon voice recognition software. Occasional wrong-word or sound-a-like substitutions may have occurred due to the inherent limitations of voice recognition software.   This document serves as a record of services  personally performed by [name], DO. It was created on her behalf by Mayer Masker, a trained medical scribe. The creation of this record is based on the scribe's personal observations and the provider's statements to them.   I have reviewed the above medical documentation for accuracy and completeness and I concur.  [name] 09/29/17 11:48 AM --------------------------------------------------------------------------------------------------------------------------------------------------------------------------------------------------------------------------------------------  Subjective:     HPI: Leslie Dixon is a 72 y.o. female who presents to [place] at [facility] today for issues as discussed below. Pt has a h/o cholecystectomy.   Joint pain Her joints are terrible and she is miserable on some days. She is taking meloxicam, but stopped taking this due to her IBS flare up symptoms listed below. She has pain in her neck and shoulders. She sees Dr. Nelva Bush, who will do injections due to her spinal fusion.  She feels tight in her shoulders and neck. She has tried boniva but not muscle relaxers.    Abdominal pain She reports 2-3 weeks ago, she has had horrible stomach pains and had an IBS flare that hurt like "hell". She has abdominal pain all around her abdomen, and especially in the area after she had her cholecystectomy. Her pain lasted for 2-4 days and improved over time. Her pain is worse when her stomach is empty. She has tried drinking coffee which helped her pain mildly. She has an appointment with Dr. Oletta Lamas, GI, at [place] next week, but she is wanting to switch GI providers soon. She has been drinking 2 tsp apple cider vinegar and a glass of water with significant relief to her pain. She was taking prilosec but stopped because her apple cider vinegar was working well for her. She has lower left pain abdominal pain, nausea, reflux, and diarrhea.  Foods that make it worse are corn, nuts, and broccoli. She denies vomiting and  chest pain. Her diarrhea has since resolved, but her stomach pain is still there.   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 She does not want any medications for her mood. She rides two miles a day on her stationary bike and is walking frequently.   Neurology: She had a recent referral to neurology who prescribed her sumatriptan for her headaches. She takes this PRN and has not needed it that often.   Wt Readings from Last 3 Encounters:  09/29/17 138 lb (62.6 kg)  05/27/17 136 lb 6.4 oz (61.9 kg)  05/10/17 136 lb (61.7 kg)   BP Readings from Last 3 Encounters:  09/29/17 106/68  05/27/17 (!) 122/58  05/10/17 126/73   Pulse Readings from Last 3 Encounters:  09/29/17 76  05/27/17 68  05/10/17 66   BMI Readings from Last 3 Encounters:  09/29/17 22.27 kg/m  05/27/17 22.02 kg/m  05/10/17 21.95 kg/m     Patient Care Team    Relationship Specialty  Notifications Start End  [name], DO PCP - General Family Medicine  02/20/16   Heath Lark, MD Consulting Physician Hematology and Oncology  01/02/15   Suella Broad, MD Consulting Physician Physical Medicine and Rehabilitation  04/20/16    Comment: lumbar injections  Myrlene Broker, MD Attending Physician Urology  04/20/16   Tyler Pita, MD Referring Physician Physical Medicine and Rehabilitation  04/20/16    Comment: Pain medicine  Katy Apo, MD Consulting Physician Ophthalmology  04/20/16    Comment: uveitis  Juanda Chance, NP Nurse Practitioner Obstetrics and Gynecology  04/20/16   Druscilla Brownie, MD Consulting Physician Dermatology  04/20/16    Comment: Lennie Odor, NP  Carloyn Manner, MD Referring Physician Otolaryngology  02/08/17    Comment: ENT Doc  Renato Shin, MD Consulting Physician Endocrinology  02/23/17   Katy Apo, MD Consulting Physician Ophthalmology  04/22/17    Comment: sees  yrly for eye exam- every fall  Laurence Spates, MD Consulting Physician Gastroenterology  05/11/17      Patient Active Problem List   Diagnosis Date Noted  . Family history of breast cancer- sister at age 36- new onset 02/23/2017    Priority: High  . Chronic vertigo 09/21/2016    Priority: High  . h/o GAD (had panic in past) 02/12/2016    Priority: High  . Fibromyalgia 03/07/2007    Priority: High  . GERD (gastroesophageal reflux disease)     Priority: Medium  . Osteoporosis     Priority: Medium  . Chronic back pain     Priority: Medium  . LACTOSE INTOLERANCE 03/07/2007    Priority: Medium  . OA (osteoarthritis) 03/07/2007    Priority: Medium  . History of thyroid nodule- R side ( Dr Loanne Drilling eval in 10/17) 02/23/2017    Priority: Low  . ETD (Eustachian tube dysfunction), bilateral  ( R >er L ) 11/19/2016    Priority: Low  . 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 Insomnia 07/29/2016    Priority: Low  . History of non anemic vitamin B12 deficiency 05/02/2016    Priority: Low  . Environmental and seasonal allergies 04/20/2016    Priority: Low  . Idiopathic benign Leukopenia     Priority: Low  . Ocular migraine 03/07/2007    Priority: Low  . Irritable bowel syndrome with diarrhea 09/29/2017  . Migraine without status migrainosus, not intractable 09/29/2017  . Maxillary sinusitis 05/10/2017  . Postnasal discharge 05/10/2017  . BPPV (benign paroxysmal positional vertigo), right 05/10/2017  . Status post lumbar surgery- foot drop R foot 02/23/2017  . 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 Presbycusis of both ears 09/30/2016  . Right thyroid nodule 05/28/2016  . Intolerance to cold 05/02/2016  . Encounter for wellness examination 04/20/2016  . Can't get food down 04/10/2012  . Difficulty speaking 04/10/2012  . Hydronephrosis 03/22/2012  . Back pain   . Chronic infection of sinus 07/09/2011  . Blood in the urine  05/06/2011  . 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 Calculus of kidney 05/06/2011  . HYPOKALEMIA 01/03/2008  . Allergic rhinitis due to pollen 12/13/2007  . 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 CARPAL TUNNEL SYNDROME 03/07/2007  . VARICOSE VEIN 03/07/2007  . NEPHROLITHIASIS, HX OF 03/07/2007    Past Medical history, Surgical history, Family history, Social history, Allergies and Medications have been entered into the medical record, reviewed and changed as needed.    Current Meds  Medication Sig  . albuterol (PROVENTIL HFA;VENTOLIN HFA) 108 (90 Base) MCG/ACT inhaler Inhale 1-2 puffs into the lungs every 4 (four) hours as needed for wheezing or shortness of breath (only short term treatment).  . Cholecalciferol (VITAMIN D3) 5000 units TABS 5,000 IU OTC vitamin D3 daily.  Marland Kitchen ibandronate (BONIVA) 150 MG tablet TAKE 1 TABLET BY MOUTH  EVERY 30 DAYS  . loratadine (CLARITIN) 10 MG tablet Take by mouth.  . meclizine (ANTIVERT) 25 MG tablet TAKE 1 TABLET BY MOUTH 3 TIMES DAILY AS NEEDED FOR DIZZINESS.  . meloxicam (MOBIC) 7.5 MG tablet Take 1-2 tablets daily for joint pains  . triamcinolone (NASACORT ALLERGY 24HR) 55 MCG/ACT AERO nasal inhaler Place 1 spray into the nose daily.  . Turmeric 500 MG CAPS Take 2 capsules by mouth daily at 8 pm.  . vitamin B-12 (CYANOCOBALAMIN) 1000 MCG tablet Take 1,000 mcg by mouth daily.  . vitamin C (ASCORBIC ACID) 500 MG tablet Take 500 mg by mouth daily.  . vitamin E 400 UNIT capsule Take 400 Units by mouth daily. Patient takes 2 a day  . [DISCONTINUED] meloxicam (MOBIC) 7.5 MG tablet Take 1-2 tablets daily for joint pains    Allergies:  Allergies  Allergen Reactions  . Peanut Oil Anaphylaxis  . Clarithromycin     REACTION: GI  . Codeine     REACTION: nausea and vomiting  . Lactose     Other reaction(s): GI Upset (intolerance)  . 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 Penicillins     REACTION: per allergy  testing/Patient states she has taken Augmentin without complications.   . Pregabalin     REACTION: headache, several side effects  . Sulfa Antibiotics Other (See Comments)    REACTION: Hives, wheezing  . Sulfonamide Derivatives     REACTION: Hives, wheezing  . Cefdinir Rash     Review of Systems:  A fourteen system review of systems was performed and found to be positive as per HPI.   Objective:   Blood pressure 106/68, pulse 76, height 5\' 6"  (1.676 m), weight 138 lb (62.6 kg), SpO2 98 %. Body mass index is 22.27 kg/m. General:  Well Developed, well nourished, appropriate for stated age.  Neuro:  Alert and oriented,  extra-ocular muscles intact  HEENT:  Normocephalic, atraumatic, neck supple, no carotid bruits appreciated  Skin:  no gross rash, warm, pink. Cardiac:  RRR, S1 S2 Respiratory:  ECTA B/L and A/P, Not using accessory muscles, speaking in full sentences- unlabored. Vascular:  Ext warm, no cyanosis apprec.; cap RF less 2 sec. Psych:  No HI/SI, judgement and insight good, Euthymic mood. Full Affect. Abdomen: positive bowel sounds x4, go G/R/R. No OM. No masses. Nontender, nondistended.

## 2017-09-29 NOTE — Patient Instructions (Addendum)
For your flare of IBS, please continue 1 tablespoon apple cider vinegar daily.  Also for your reflux and heartburn please continue the Prilosec twice daily.  Do this until you see gastroenterology in follow-up and follow their recommendations in the future.  As you are requesting a new GI provider at [place], call and ask if you can see Clarene Essex, MD at the same practice.    If they do not allow you to switch providers-and not see Dr. Oletta Lamas in the future, call our office here and we will refer you to see another gastroenterology physician affiliated with [facility].   -Call your insurance and see if you qualify for silver sneakers and ask them which gyms or facilities you can use for free.  It is very important that you start doing things outside of the house since tensions are high between your husband and yourself now.  Please make sure you are exercising every day and doing something for you.  -Make sure you call Dr. Nelva Bush office and follow-up with them regarding injections in your neck for your flared neck pain.  Take the mobic up to 15mg  daily- may take QD  If needed for pain.  -please take the cyclobenzaprine which is Flexeril nightly.  This should help with the tightness in the muscles of your neck and help you sleep better.  Also for muscle spasms only, he is always good.  For joint pains however, ice is preferred  -

## 2017-09-30 LAB — COMPREHENSIVE METABOLIC PANEL
A/G RATIO: 2 (ref 1.2–2.2)
ALK PHOS: 67 IU/L (ref 39–117)
ALT: 19 IU/L (ref 0–32)
AST: 23 IU/L (ref 0–40)
Albumin: 4.5 g/dL (ref 3.5–4.8)
BILIRUBIN TOTAL: 0.2 mg/dL (ref 0.0–1.2)
BUN/Creatinine Ratio: 27 (ref 12–28)
BUN: 17 mg/dL (ref 8–27)
CO2: 22 mmol/L (ref 20–29)
Calcium: 9.2 mg/dL (ref 8.7–10.3)
Chloride: 106 mmol/L (ref 96–106)
Creatinine, Ser: 0.63 mg/dL (ref 0.57–1.00)
GFR calc Af Amer: 102 mL/min/{1.73_m2} (ref >59)
GFR, EST NON AFRICAN AMERICAN: 89 mL/min/{1.73_m2} (ref >59)
GLOBULIN, TOTAL: 2.3 g/dL (ref 1.5–4.5)
Glucose: 80 mg/dL (ref 65–99)
Potassium: 4.6 mmol/L (ref 3.5–5.2)
Sodium: 142 mmol/L (ref 134–144)
Total Protein: 6.8 g/dL (ref 6.0–8.5)

## 2017-09-30 LAB — CBC WITH DIFFERENTIAL/PLATELET
Basophils Absolute: 0 10*3/uL (ref 0.0–0.2)
Basos: 0 %
EOS (ABSOLUTE): 0.1 10*3/uL (ref 0.0–0.4)
EOS: 1 %
HEMATOCRIT: 42 % (ref 34.0–46.6)
Hemoglobin: 13.9 g/dL (ref 11.1–15.9)
Immature Grans (Abs): 0 10*3/uL (ref 0.0–0.1)
Immature Granulocytes: 0 %
Lymphocytes Absolute: 2.2 10*3/uL (ref 0.7–3.1)
Lymphs: 47 %
MCH: 30 pg (ref 26.6–33.0)
MCHC: 33.1 g/dL (ref 31.5–35.7)
MCV: 91 fL (ref 79–97)
MONOS ABS: 0.3 10*3/uL (ref 0.1–0.9)
Monocytes: 7 %
NEUTROS ABS: 2.1 10*3/uL (ref 1.4–7.0)
Neutrophils: 45 %
Platelets: 191 10*3/uL (ref 150–379)
RBC: 4.63 x10E6/uL (ref 3.77–5.28)
RDW: 16.1 % — AB (ref 12.3–15.4)
WBC: 4.7 10*3/uL (ref 3.4–10.8)

## 2017-09-30 LAB — BILIRUBIN, DIRECT: BILIRUBIN, DIRECT: 0.09 mg/dL (ref 0.00–0.40)

## 2017-09-30 LAB — LIPASE: Lipase: 24 U/L (ref 14–85)

## 2017-09-30 LAB — GAMMA GT: GGT: 10 IU/L (ref 0–60)

## 2017-09-30 LAB — AMYLASE: AMYLASE: 51 U/L (ref 31–124)

## 2017-10-18 ENCOUNTER — Telehealth: Payer: Self-pay | Admitting: Family Medicine

## 2017-10-18 ENCOUNTER — Other Ambulatory Visit: Payer: Self-pay | Admitting: Family Medicine

## 2017-10-18 MED ORDER — ESCITALOPRAM OXALATE 20 MG PO TABS: ORAL_TABLET | ORAL | 1 refills | Status: DC

## 2017-10-18 NOTE — Telephone Encounter (Signed)
Called and notified the patient, she expressed understanding. MPulliam, CMA/RT(R)

## 2017-10-18 NOTE — Telephone Encounter (Signed)
Patient was told at last OV that she could be put on a med for stress (she could not remember the med but knew Dr. Jenetta Downer suggested it). She has had increased stress recently taking care of her husband and would like to go ahead with this med. If approved please send to [facility] on [address]

## 2017-10-18 NOTE — Telephone Encounter (Signed)
Start lexapro 20 mg tabs Tell pt to take 1/2 tablets q am for 1-2 wks then go to 1 tab PO QD - I will send in script

## 2017-10-18 NOTE — Telephone Encounter (Signed)
Patient was last seen on 09/29/2017.  Anxiety/depression was discussed but patient declined medication at that time. Please advise. MPulliam, CMA/RT(R)

## 2017-10-19 DIAGNOSIS — M961 Postlaminectomy syndrome, not elsewhere classified: Secondary | ICD-10-CM | POA: Diagnosis not present

## 2017-10-19 DIAGNOSIS — G8929 Other chronic pain: Secondary | ICD-10-CM | POA: Insufficient documentation

## 2017-10-19 DIAGNOSIS — M5030 Other cervical disc degeneration, unspecified cervical region: Secondary | ICD-10-CM | POA: Diagnosis not present

## 2017-10-24 DIAGNOSIS — M5412 Radiculopathy, cervical region: Secondary | ICD-10-CM | POA: Insufficient documentation

## 2017-10-26 DIAGNOSIS — M5412 Radiculopathy, cervical region: Secondary | ICD-10-CM | POA: Diagnosis not present

## 2017-10-31 DIAGNOSIS — M5412 Radiculopathy, cervical region: Secondary | ICD-10-CM | POA: Diagnosis not present

## 2017-11-02 ENCOUNTER — Encounter: Payer: Self-pay | Admitting: Family Medicine

## 2017-11-02 ENCOUNTER — Ambulatory Visit (INDEPENDENT_AMBULATORY_CARE_PROVIDER_SITE_OTHER): Payer: 59 | Admitting: Family Medicine

## 2017-11-02 VITALS — BP 117/68 | HR 68 | Wt 136.3 lb

## 2017-11-02 DIAGNOSIS — M62838 Other muscle spasm: Secondary | ICD-10-CM

## 2017-11-02 DIAGNOSIS — M797 Fibromyalgia: Secondary | ICD-10-CM | POA: Diagnosis not present

## 2017-11-02 DIAGNOSIS — G243 Spasmodic torticollis: Secondary | ICD-10-CM

## 2017-11-02 MED ORDER — CYCLOBENZAPRINE HCL 10 MG PO TABS: ORAL_TABLET | ORAL | 0 refills | Status: DC

## 2017-11-02 NOTE — Patient Instructions (Addendum)
SCM muscle spasm and torticollis.  Please ask your physical therapist if dry needling may help this.  Otherwise please make her aware so they can modify any activity to avoid any further aggravation of this.  -See handout I gave you for more information on this condition.  However, please do exercising and stretching per your physical therapist-please do not follow handouts I provided since you are seeing a professional physical therapist already.    Cyclobenzaprine tablets What is this medicine? CYCLOBENZAPRINE (sye kloe BEN za preen) is a muscle relaxer. It is used to treat muscle pain, spasms, and stiffness. This medicine may be used for other purposes; ask your health care provider or pharmacist if you have questions. COMMON BRAND NAME(S): Fexmid, Flexeril What should I tell my health care provider before I take this medicine? They need to know if you have any of these conditions: -heart disease, irregular heartbeat, or previous heart attack -liver disease -thyroid problem -an unusual or allergic reaction to cyclobenzaprine, tricyclic antidepressants, lactose, other medicines, foods, dyes, or preservatives -pregnant or trying to get pregnant -breast-feeding How should I use this medicine? Take this medicine by mouth with a glass of water. Follow the directions on the prescription label. If this medicine upsets your stomach, take it with food or milk. Take your medicine at regular intervals. Do not take it more often than directed. Talk to your pediatrician regarding the use of this medicine in children. Special care may be needed. Overdosage: If you think you have taken too much of this medicine contact a poison control center or emergency room at once. NOTE: This medicine is only for you. Do not share this medicine with others. What if I miss a dose? If you miss a dose, take it as soon as you can. If it is almost time for your next dose, take only that dose. Do not take double or extra  doses. What may interact with this medicine? Do not take this medicine with any of the following medications: -certain medicines for fungal infections like fluconazole, itraconazole, ketoconazole, posaconazole, voriconazole -cisapride -dofetilide -dronedarone -halofantrine -levomethadyl -MAOIs like Carbex, Eldepryl, Marplan, Nardil, and Parnate -narcotic medicines for cough -pimozide -thioridazine -ziprasidone This medicine may also interact with the following medications: -alcohol -antihistamines for allergy, cough and cold -certain medicines for anxiety or sleep -certain medicines for cancer -certain medicines for depression like amitriptyline, fluoxetine, sertraline -certain medicines for infection like alfuzosin, chloroquine, clarithromycin, levofloxacin, mefloquine, pentamidine, troleandomycin -certain medicines for irregular heart beat -certain medicines for seizures like phenobarbital, primidone -contrast dyes -general anesthetics like halothane, isoflurane, methoxyflurane, propofol -local anesthetics like lidocaine, pramoxine, tetracaine -medicines that relax muscles for surgery -narcotic medicines for pain -other medicines that prolong the QT interval (cause an abnormal heart rhythm) -phenothiazines like chlorpromazine, mesoridazine, prochlorperazine This list may not describe all possible interactions. Give your health care provider a list of all the medicines, herbs, non-prescription drugs, or dietary supplements you use. Also tell them if you smoke, drink alcohol, or use illegal drugs. Some items may interact with your medicine. What should I watch for while using this medicine? Tell your doctor or health care professional if your symptoms do not start to get better or if they get worse. You may get drowsy or dizzy. Do not drive, use machinery, or do anything that needs mental alertness until you know how this medicine affects you. Do not stand or sit up quickly,  especially if you are an older patient. This reduces the risk of dizzy or  fainting spells. Alcohol may interfere with the effect of this medicine. Avoid alcoholic drinks. If you are taking another medicine that also causes drowsiness, you may have more side effects. Give your health care provider a list of all medicines you use. Your doctor will tell you how much medicine to take. Do not take more medicine than directed. Call emergency for help if you have problems breathing or unusual sleepiness. Your mouth may get dry. Chewing sugarless gum or sucking hard candy, and drinking plenty of water may help. Contact your doctor if the problem does not go away or is severe. What side effects may I notice from receiving this medicine? Side effects that you should report to your doctor or health care professional as soon as possible: -allergic reactions like skin rash, itching or hives, swelling of the face, lips, or tongue -breathing problems -chest pain -fast, irregular heartbeat -hallucinations -seizures -unusually weak or tired Side effects that usually do not require medical attention (report to your doctor or health care professional if they continue or are bothersome): -headache -nausea, vomiting This list may not describe all possible side effects. Call your doctor for medical advice about side effects. You may report side effects to FDA at 1-800-FDA-1088. Where should I keep my medicine? Keep out of the reach of children. Store at room temperature between 15 and 30 degrees C (59 and 86 degrees F). Keep container tightly closed. Throw away any unused medicine after the expiration date. NOTE: This sheet is a summary. It may not cover all possible information. If you have questions about this medicine, talk to your doctor, pharmacist, or health care provider.  2018 [employer]vier/Gold Standard (2015-04-22 12:05:46)

## 2017-11-02 NOTE — Progress Notes (Signed)
Pt here for an acute care OV today   Impression and Recommendations:    1. Torticollis, spasmodic- L SCM   2. Muscle spasm   3. Fibromyalgia     1. Strained Muscle in Neck - Sternocleidomastoid muscle on left side strained and aggravated. - To treat swollen muscle, tell physical therapy that sternocleidomastoid muscle is strained and to be aware of this.  - Begin applying ice cup massage to the strained muscle.  - Patient may begin taking her flexeril.  Recommended flexeril up to three times a day - 5, 5, and 10.  May take two at night if needed.   - Continue taking meloxicam until GI doctor says otherwise.  3. Follow-Up - If muscle pain continues to get worse, return PRN.   Meds ordered this encounter  Medications  . cyclobenzaprine (FLEXERIL) 10 MG tablet    Sig: 0.5-1 tablet every 8 hours only as needed muscle spasm    Dispense:  60 tablet    Refill:  0    No orders of the defined types were placed in this encounter.    Education and routine counseling performed. Handouts provided  Gross side effects, risk and benefits, and alternatives of medications and treatment plan in general discussed with patient.  Patient is aware that all medications have potential side effects and we are unable to predict every side effect or drug-drug interaction that may occur.   Patient will call with any questions prior to using medication if they have concerns.  Expresses verbal understanding and consents to current therapy and treatment regimen.  No barriers to understanding were identified.  Red flag symptoms and signs discussed in detail.  Patient expressed understanding regarding what to do in case of emergency\urgent symptoms   Please see AVS handed out to patient at the end of our visit for further patient instructions/ counseling done pertaining to today's office visit.   Return if symptoms worsen or fail to improve, for Also follow-up chronic conditions as previously  discussed.     Note: This document was prepared occasionally using Dragon voice recognition software and may include unintentional dictation errors in addition to a scribe.   This document serves as a record of services personally performed by [name], DO. It was created on her behalf by Toni Amend, a trained medical scribe. The creation of this record is based on the scribe's personal observations and the provider's statements to them.   I have reviewed the above medical documentation for accuracy and completeness and I concur.  [name] 11/08/17 1:17 PM   -----------------------------------------------------------------------------------------------------------------------------------------------------------   Subjective:    CC:  Chief Complaint  Patient presents with  . Other    gland swollen on left side of neck x 2 wks     HPI: Leslie Dixon is a 72 y.o. female who presents to [facility] today for issues as discussed below.  Notes having a "swollen gland" in her neck.  (Is actually sternocleidomastoid muscle, aggravated).  Was recently placed in physical therapy by Dr. Nelva Bush and may have strained her neck. Has been going to physical therapy twice a week; just went to her third visit on Monday. Has next appointment on Friday at 2 PM.  Has been given flexeril but has not been taking them.  No problems updated.   Wt Readings from Last 3 Encounters:  11/02/17 136 lb 4.8 oz (61.8 kg)  09/29/17 138 lb (62.6 kg)  05/27/17 136  lb 6.4 oz (61.9 kg)   BP Readings from Last 3 Encounters:  11/02/17 117/68  09/29/17 106/68  05/27/17 (!) 122/58   BMI Readings from Last 3 Encounters:  11/02/17 22.00 kg/m  09/29/17 22.27 kg/m  05/27/17 22.02 kg/m     Patient Care Team    Relationship Specialty Notifications Start End  [name], DO PCP - General Family Medicine  02/20/16   Heath Lark, MD Consulting  Physician Hematology and Oncology  01/02/15   Suella Broad, MD Consulting Physician Physical Medicine and Rehabilitation  04/20/16    Comment: lumbar injections  Myrlene Broker, MD Attending Physician Urology  04/20/16   Tyler Pita, MD Referring Physician Physical Medicine and Rehabilitation  04/20/16    Comment: Pain medicine  Katy Apo, MD Consulting Physician Ophthalmology  04/20/16    Comment: uveitis  Juanda Chance, NP Nurse Practitioner Obstetrics and Gynecology  04/20/16   Druscilla Brownie, MD Consulting Physician Dermatology  04/20/16    Comment: Lennie Odor, NP  Carloyn Manner, MD Referring Physician Otolaryngology  02/08/17    Comment: ENT Doc  Renato Shin, MD Consulting Physician Endocrinology  02/23/17   Katy Apo, MD Consulting Physician Ophthalmology  04/22/17    Comment: sees yrly for eye exam- every fall  Laurence Spates, MD Consulting Physician Gastroenterology  05/11/17      Patient Active Problem List   Diagnosis Date Noted  . Family history of breast cancer- sister at age 54- new onset 02/23/2017    Priority: High  . Chronic vertigo 09/21/2016    Priority: High  . h/o GAD (had panic in past) 02/12/2016    Priority: High  . Fibromyalgia 03/07/2007    Priority: High  . GERD (gastroesophageal reflux disease)     Priority: Medium  . Osteoporosis     Priority: Medium  . Chronic back pain     Priority: Medium  . LACTOSE INTOLERANCE 03/07/2007    Priority: Medium  . OA (osteoarthritis) 03/07/2007    Priority: Medium  . History of thyroid nodule- R side ( Dr Loanne Drilling eval in 10/17) 02/23/2017    Priority: Low  . ETD (Eustachian tube dysfunction), bilateral  ( R >er L ) 11/19/2016    Priority: Low  . 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 Insomnia 07/29/2016    Priority: Low  . History of non anemic vitamin B12 deficiency 05/02/2016    Priority: Low  . Environmental and seasonal allergies 04/20/2016      Priority: Low  . Idiopathic benign Leukopenia     Priority: Low  . Ocular migraine 03/07/2007    Priority: Low  . Cervical radiculopathy 10/24/2017  . Chronic pain 10/19/2017  . DDD (degenerative disc disease), cervical 10/19/2017  . Irritable bowel syndrome with diarrhea 09/29/2017  . Migraine without status migrainosus, not intractable 09/29/2017  . Maxillary sinusitis 05/10/2017  . Postnasal discharge 05/10/2017  . BPPV (benign paroxysmal positional vertigo), right 05/10/2017  . Status post lumbar surgery- foot drop R foot 02/23/2017  . 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 Presbycusis of both ears 09/30/2016  . Right thyroid nodule 05/28/2016  . Intolerance to cold 05/02/2016  . Encounter for wellness examination 04/20/2016  . Can't get food down 04/10/2012  . Difficulty speaking 04/10/2012  . Hydronephrosis 03/22/2012  . Back pain   . Chronic infection of sinus 07/09/2011  . Blood in the urine 05/06/2011  . 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 Calculus of kidney 05/06/2011  . HYPOKALEMIA 01/03/2008  . Allergic rhinitis due to pollen 12/13/2007  . 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 CARPAL TUNNEL SYNDROME 03/07/2007  . VARICOSE VEIN 03/07/2007  . NEPHROLITHIASIS, HX OF 03/07/2007    Past Medical history, Surgical history, Family history, Social history, Allergies and Medications have been entered into the medical record, reviewed and changed as needed.    Current Meds  Medication Sig  . albuterol (PROVENTIL HFA;VENTOLIN HFA) 108 (90 Base) MCG/ACT inhaler Inhale 1-2 puffs into the lungs every 4 (four) hours as needed for wheezing or shortness of breath (only short term treatment).  . Cholecalciferol (VITAMIN D3) 5000 units TABS 5,000 IU OTC vitamin D3 daily.  . cyclobenzaprine (FLEXERIL) 10 MG tablet 0.5-1 tablet every 8 hours only as needed muscle spasm  . escitalopram (LEXAPRO) 20 MG tablet One half tab daily for a week then one tab by mouth daily  . ibandronate  (BONIVA) 150 MG tablet TAKE 1 TABLET BY MOUTH  EVERY 30 DAYS  . loratadine (CLARITIN) 10 MG tablet Take by mouth.  . meclizine (ANTIVERT) 25 MG tablet TAKE 1 TABLET BY MOUTH 3 TIMES DAILY AS NEEDED FOR DIZZINESS.  . meloxicam (MOBIC) 7.5 MG tablet Take 1-2 tablets daily for joint pains  . omeprazole (PRILOSEC) 20 MG capsule Take 1 capsule (20 mg total) by mouth 2 (two) times daily before a meal.  . triamcinolone (NASACORT ALLERGY 24HR) 55 MCG/ACT AERO nasal inhaler Place 1 spray into the nose daily.  . Turmeric 500 MG CAPS Take 2 capsules by mouth daily at 8 pm.  . vitamin B-12 (CYANOCOBALAMIN) 1000 MCG tablet Take 1,000 mcg by mouth daily.  . vitamin C (ASCORBIC ACID) 500 MG tablet Take 500 mg by mouth daily.  . vitamin E 400 UNIT capsule Take 400 Units by mouth daily. Patient takes 2 a day  . [DISCONTINUED] cyclobenzaprine (FLEXERIL) 5 MG tablet Take 1 tablet (5 mg total) by mouth at bedtime. Only as needed muscle spasm    Allergies:  Allergies  Allergen Reactions  . Peanut Oil Anaphylaxis  . Clarithromycin     REACTION: GI  . Codeine     REACTION: nausea and vomiting  . Lactose     Other reaction(s): GI Upset (intolerance)  . 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 Penicillins     REACTION: per allergy testing/Patient states she has taken Augmentin without complications.   . Pregabalin     REACTION: headache, several side effects  . Sulfa Antibiotics Other (See Comments)    REACTION: Hives, wheezing  . Sulfonamide Derivatives     REACTION: Hives, wheezing  . Cefdinir Rash     Review of Systems: General:   Denies fever, chills, unexplained weight loss.  Optho/Auditory:   Denies visual changes, blurred vision/LOV Respiratory:   Denies wheeze, DOE more than baseline levels.  Cardiovascular:   Denies chest pain, palpitations, new onset peripheral edema  Gastrointestinal:   Denies nausea, vomiting, diarrhea, abd pain.  Genitourinary:  Denies dysuria, freq/ urgency, flank pain or discharge from genitals.  Endocrine:     Denies hot or cold intolerance, polyuria, polydipsia. Musculoskeletal:   Denies unexplained myalgias, joint swelling, unexplained arthralgias, gait problems.  Skin:  Denies new onset rash, suspicious lesions Neurological:     Denies dizziness, unexplained weakness, numbness  Psychiatric/Behavioral:   Denies mood changes, suicidal or homicidal ideations, hallucinations    Objective:   Blood  pressure 117/68, pulse 68, weight 136 lb 4.8 oz (61.8 kg), SpO2 98 %. Body mass index is 22 kg/m. General:  Well Developed, well nourished, appropriate for stated age.  Neuro:  Alert and oriented,  extra-ocular muscles intact  HEENT:  Normocephalic, atraumatic, neck supple Skin:  no gross rash, warm, pink. Cardiac:  RRR, S1 S2 Respiratory:  ECTA B/L and A/P, Not using accessory muscles, speaking in full sentences- unlabored. Vascular:  Ext warm, no cyanosis apprec.; cap RF less 2 sec. Psych:  No HI/SI, judgement and insight good, Euthymic mood. Full Affect. Left Neck: Significant tenderness and spasm to sternal head of SCM at insertion point of clavicle/sternum.  Muscle bogginess and tenderness along entire muscle belly up to mastoid process. No lymphadenopathy appreciated, no other masses appreciated.

## 2017-11-04 DIAGNOSIS — M5030 Other cervical disc degeneration, unspecified cervical region: Secondary | ICD-10-CM | POA: Diagnosis not present

## 2017-11-04 DIAGNOSIS — M5412 Radiculopathy, cervical region: Secondary | ICD-10-CM | POA: Diagnosis not present

## 2017-11-14 DIAGNOSIS — M5412 Radiculopathy, cervical region: Secondary | ICD-10-CM | POA: Diagnosis not present

## 2017-11-21 ENCOUNTER — Ambulatory Visit: Payer: 59

## 2017-11-21 ENCOUNTER — Ambulatory Visit (INDEPENDENT_AMBULATORY_CARE_PROVIDER_SITE_OTHER): Payer: 59 | Admitting: Adult Health

## 2017-11-21 ENCOUNTER — Encounter: Payer: Self-pay | Admitting: Adult Health

## 2017-11-21 VITALS — BP 98/56 | HR 90 | Temp 97.5°F | Ht 66.0 in | Wt 134.9 lb

## 2017-11-21 DIAGNOSIS — R0602 Shortness of breath: Secondary | ICD-10-CM

## 2017-11-21 DIAGNOSIS — R058 Other specified cough: Secondary | ICD-10-CM

## 2017-11-21 DIAGNOSIS — R05 Cough: Secondary | ICD-10-CM

## 2017-11-21 DIAGNOSIS — J0100 Acute maxillary sinusitis, unspecified: Secondary | ICD-10-CM | POA: Diagnosis not present

## 2017-11-21 DIAGNOSIS — R062 Wheezing: Secondary | ICD-10-CM | POA: Diagnosis not present

## 2017-11-21 DIAGNOSIS — R059 Cough, unspecified: Secondary | ICD-10-CM

## 2017-11-21 MED ORDER — ALBUTEROL SULFATE HFA 108 (90 BASE) MCG/ACT IN AERS: 1.0000 | INHALATION_SPRAY | RESPIRATORY_TRACT | 1 refills | Status: DC | PRN

## 2017-11-21 MED ORDER — DOXYCYCLINE HYCLATE 100 MG PO TABS: 100.0000 mg | ORAL_TABLET | Freq: Two times a day (BID) | ORAL | 0 refills | Status: DC

## 2017-11-21 MED ORDER — BENZONATATE 100 MG PO CAPS: 200.0000 mg | ORAL_CAPSULE | Freq: Two times a day (BID) | ORAL | 0 refills | Status: DC | PRN

## 2017-11-21 NOTE — Progress Notes (Addendum)
Subjective:    Patient ID: Leslie Dixon, female    DOB: 1946/01/01, 72 y.o.   MRN: [id number]  HPI:  Leslie Dixon presents with productive cough (thick/yellow mucus), copious clear nasal drainage, HA (4/10 that is dull ache), chest tightness, and fatigue that all started >1.5 weeks ago and has been steadily worsening. She has been increasing fluids and using OTC sudafed and recently stopped using OTC Claritin and started OTC Allegra. She denies CP/palpitations/N/V/D/fever/night sweats/chills. She denies tobacco use She had bil tubes placed in her ears and completed 10 day course of Augmentin in Jan 2019.  Patient Care Team    Relationship Specialty Notifications Start End  [name], DO PCP - General Family Medicine  02/20/16   Heath Lark, MD Consulting Physician Hematology and Oncology  01/02/15   Suella Broad, MD Consulting Physician Physical Medicine and Rehabilitation  04/20/16    Comment: lumbar injections  Myrlene Broker, MD Attending Physician Urology  04/20/16   Tyler Pita, MD Referring Physician Physical Medicine and Rehabilitation  04/20/16    Comment: Pain medicine  Charleene Callegari Apo, MD Consulting Physician Ophthalmology  04/20/16    Comment: uveitis  Juanda Chance, NP Nurse Practitioner Obstetrics and Gynecology  04/20/16   Druscilla Brownie, MD Consulting Physician Dermatology  04/20/16    Comment: Lennie Odor, NP  Carloyn Manner, MD Referring Physician Otolaryngology  02/08/17    Comment: ENT Doc  Renato Shin, MD Consulting Physician Endocrinology  02/23/17   Oluwadarasimi Favor Apo, MD Consulting Physician Ophthalmology  04/22/17    Comment: sees yrly for eye exam- every fall  Laurence Spates, MD Consulting Physician Gastroenterology  05/11/17     Patient Active Problem List   Diagnosis Date Noted  . Cough 11/21/2017  . SOB (shortness of breath) 11/21/2017  . Cervical radiculopathy 10/24/2017  . Chronic pain 10/19/2017  . DDD (degenerative disc disease),  cervical 10/19/2017  . Irritable bowel syndrome with diarrhea 09/29/2017  . Migraine without status migrainosus, not intractable 09/29/2017  . Maxillary sinusitis 05/10/2017  . Postnasal discharge 05/10/2017  . BPPV (benign paroxysmal positional vertigo), right 05/10/2017  . Family history of breast cancer- sister at age 82- new onset 02/23/2017  . History of thyroid nodule- R side ( Dr Loanne Drilling eval in 10/17) 02/23/2017  . Status post lumbar surgery- foot drop R foot 02/23/2017  . ETD (Eustachian tube dysfunction), bilateral  ( R >er L ) 11/19/2016  . Chronic middle ear effusion, bilateral ( R >er L ) 11/19/2016  . 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 Presbycusis of both ears 09/30/2016  . Chronic vertigo 09/21/2016  . Insomnia 07/29/2016  . Right thyroid nodule 05/28/2016  . Intolerance to cold 05/02/2016  . History of non anemic vitamin B12 deficiency 05/02/2016  . Environmental and seasonal allergies 04/20/2016  . Encounter for wellness examination 04/20/2016  . h/o GAD (had panic in past) 02/12/2016  . GERD (gastroesophageal reflux disease)   . Osteoporosis   . Chronic back pain   . Can't get food down 04/10/2012  . Difficulty speaking 04/10/2012  . Hydronephrosis 03/22/2012  . Idiopathic benign Leukopenia   . Back pain   . Chronic infection of sinus 07/09/2011  . Blood in the urine 05/06/2011  . 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 Calculus of kidney 05/06/2011  . HYPOKALEMIA 01/03/2008  . Allergic rhinitis due to pollen 12/13/2007  . 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 LACTOSE INTOLERANCE 03/07/2007  . Ocular migraine 03/07/2007  . CARPAL TUNNEL  SYNDROME 03/07/2007  . VARICOSE VEIN 03/07/2007  . OA (osteoarthritis) 03/07/2007  . Fibromyalgia 03/07/2007  . NEPHROLITHIASIS, HX OF 03/07/2007     Past Medical History:  Diagnosis Date  . Blood dyscrasia    LEUKOPENIA- DR. CHISM - NO TREATMENT BUT IS MONITORED  . Chronic back pain   . Chronic back pain   .  Fibromyalgia   . Foot drop, right    RELATED TO LUMBAR PROBLEMS  . GERD (gastroesophageal reflux disease)    PRILOSEC PRN  . History of kidney stones   . Horseshoe kidney    BILATERAL  . Leukopenia   . Migraine    OCCULAR MIGRAINES - AURA WITH EYES  . Osteoporosis   . Pain    LUMBAR PAIN - RECENT FALL BECAUSE LEGS GAVE OUT - PT HAS HAD LUMBAR INJECTION SINCE THE FALL THAT HAS HELPED BACK PAIN  . Pain    CHRONIC RIGHT UPPER QUADRANT PAIN - RELATED TO GALLBLADDER PROBLEM  . Sciatica of right side   . Sinusitis    f/u with Dr. Janace Hoard.   . Vertigo      Past Surgical History:  Procedure Laterality Date  . ABDOMINAL HYSTERECTOMY  1983  . APPENDECTOMY  1968  . BILATERAL SALPINGOOPHORECTOMY  2000  . BREAST EXCISIONAL BIOPSY Right 1979  . CHOLECYSTECTOMY N/A 04/12/2014   Procedure: LAPAROSCOPIC CHOLECYSTECTOMY WITH INTRAOPERATIVE CHOLANGIOGRAM;  Surgeon: Kaylyn Lim, MD;  Location: [facility];  Service: General;  Laterality: N/A;  . Lumbar/cervical surgeries     CERVICAL FUSION C7-C6 WITH BONE GRAFT; C6-C5 WITH PLATE AND 4 SCREWS;  3 LUMBAR SURGERIES BUT NO FUSION  . NASAL SINUS SURGERY  2010  . right knee     ARTHROSCOPY  . TYMPANOSTOMY TUBE PLACEMENT  07/2017  . WISDOM TOOTH PULLED       Family History  Problem Relation Age of Onset  . Cancer Father        Lung  . Neurodegenerative disease Mother   . Cancer Brother        prostate  . Cancer Maternal Aunt        breast cancer  . Breast cancer Maternal Aunt 66  . Cancer Maternal Grandfather        Leukemia  . Migraines Sister   . Breast cancer Sister 61  . Thyroid disease Daughter      Social History   Substance and Sexual Activity  Drug Use No     Social History   Substance and Sexual Activity  Alcohol Use No   Comment: occassionally     Social History   Tobacco Use  Smoking Status Never Smoker  Smokeless Tobacco Never Used     Outpatient Encounter Medications as of 11/21/2017  Medication Sig Note   . albuterol (PROVENTIL HFA;VENTOLIN HFA) 108 (90 Base) MCG/ACT inhaler Inhale 1-2 puffs into the lungs every 4 (four) hours as needed for wheezing or shortness of breath (only short term treatment).   . Cholecalciferol (VITAMIN D3) 5000 units TABS 5,000 IU OTC vitamin D3 daily.   . cyclobenzaprine (FLEXERIL) 10 MG tablet 0.5-1 tablet every 8 hours only as needed muscle spasm   . fexofenadine (ALLEGRA) 60 MG tablet Take 60 mg by mouth 2 (two) times daily.   Marland Kitchen ibandronate (BONIVA) 150 MG tablet TAKE 1 TABLET BY MOUTH  EVERY 30 DAYS   . meclizine (ANTIVERT) 25 MG tablet TAKE 1 TABLET BY MOUTH 3 TIMES DAILY AS NEEDED FOR DIZZINESS.   . meloxicam (MOBIC) 7.5  MG tablet Take 1-2 tablets daily for joint pains   . omeprazole (PRILOSEC) 20 MG capsule Take 1 capsule (20 mg total) by mouth 2 (two) times daily before a meal.   . triamcinolone (NASACORT ALLERGY 24HR) 55 MCG/ACT AERO nasal inhaler Place 1 spray into the nose daily.   . Turmeric 500 MG CAPS Take 2 capsules by mouth daily at 8 pm.   . vitamin B-12 (CYANOCOBALAMIN) 1000 MCG tablet Take 1,000 mcg by mouth daily.   . vitamin E 400 UNIT capsule Take 400 Units by mouth daily. Patient takes 2 a day   . [DISCONTINUED] albuterol (PROVENTIL HFA;VENTOLIN HFA) 108 (90 Base) MCG/ACT inhaler Inhale 1-2 puffs into the lungs every 4 (four) hours as needed for wheezing or shortness of breath (only short term treatment).   . benzonatate (TESSALON) 100 MG capsule Take 2 capsules (200 mg total) by mouth 2 (two) times daily as needed for cough.   . doxycycline (VIBRA-TABS) 100 MG tablet Take 1 tablet (100 mg total) by mouth 2 (two) times daily.   . vitamin C (ASCORBIC ACID) 500 MG tablet Take 500 mg by mouth daily.   . [DISCONTINUED] escitalopram (LEXAPRO) 20 MG tablet One half tab daily for a week then one tab by mouth daily   . [DISCONTINUED] loratadine (CLARITIN) 10 MG tablet Take by mouth. 01/02/2015: Received from: [place]   No  facility-administered encounter medications on file as of 11/21/2017.     Allergies: Peanut oil; Clarithromycin; Codeine; Lactose; Other; Penicillins; Pregabalin; Sulfa antibiotics; Sulfonamide derivatives; and Cefdinir  Body mass index is 21.77 kg/m.  Blood pressure (!) 98/56, pulse 90, temperature (!) 97.5 F (36.4 C), temperature source Oral, height 5\' 6"  (1.676 m), weight 134 lb 14.4 oz (61.2 kg), SpO2 98 %.  Review of Systems  Constitutional: Positive for activity change and fatigue. Negative for appetite change, chills, diaphoresis, fever and unexpected weight change.  HENT: Positive for congestion, postnasal drip, rhinorrhea, sinus pressure, sinus pain and voice change. Negative for sore throat.   Eyes: Negative for visual disturbance.  Respiratory: Positive for cough and chest tightness. Negative for shortness of breath and wheezing.   Cardiovascular: Negative for chest pain and leg swelling.  Gastrointestinal: Negative for abdominal distention, abdominal pain, blood in stool, constipation, diarrhea, nausea and vomiting.  Endocrine: Negative for cold intolerance, heat intolerance, polydipsia, polyphagia and polyuria.  Genitourinary: Negative for difficulty urinating, flank pain and hematuria.  Neurological: Positive for headaches. Negative for dizziness.  Hematological: Does not bruise/bleed easily.       Objective:   Physical Exam  Constitutional: She is oriented to person, place, and time. She appears well-developed and well-nourished. No distress.  HENT:  Head: Normocephalic and atraumatic.  Right Ear: External ear normal. Tympanic membrane is bulging. Tympanic membrane is not erythematous. No decreased hearing is noted.  Left Ear: External ear normal. Tympanic membrane is bulging. Tympanic membrane is not erythematous. No decreased hearing is noted.  Nose: Mucosal edema and rhinorrhea present. Right sinus exhibits maxillary sinus tenderness and frontal sinus tenderness.  Left sinus exhibits maxillary sinus tenderness and frontal sinus tenderness.  Mouth/Throat: Uvula is midline and mucous membranes are normal. Posterior oropharyngeal edema and posterior oropharyngeal erythema present. No oropharyngeal exudate or tonsillar abscesses.  Eyes: Pupils are equal, round, and reactive to light. EOM are normal.  bil tympanostomy tubes noted  Neck: Normal range of motion.  Cardiovascular: Normal rate, regular rhythm, normal heart sounds and intact distal pulses.  No murmur heard.  Pulmonary/Chest: Effort normal and breath sounds normal. No respiratory distress. She has no wheezes. She exhibits no tenderness.  Lymphadenopathy:    She has no cervical adenopathy.  Neurological: She is alert and oriented to person, place, and time.  Skin: Skin is warm and dry. She is not diaphoretic. No erythema. No pallor.  Psychiatric: She has a normal mood and affect. Her behavior is normal. Judgment and thought content normal.      Assessment & Plan:   1. Productive cough   2. Cough   3. SOB (shortness of breath)   4. Wheeze   5. Acute maxillary sinusitis, recurrence not specified     Maxillary sinusitis She completed 10 day course of Augmentin in last 90 days Please take Doxycycline as directed. Please use Albuterol and Tessalon as needed. Increase fluids/rest/vt c-2,000mg /day when not feeling well. Alternate OTC Acetaminophen and Ibuprofen as needed. If symptoms persist after antibiotic completed, then please call clinic.  Cough CXR- IMPRESSION: Mild hyperinflation and interstitial prominence consistent with reactive airway disease or chronic bronchitis. No alveolar pneumonia nor CHF. Doxycycline  Albuterol Tessalon     FOLLOW-UP:  Return if symptoms worsen or fail to improve.

## 2017-11-21 NOTE — Assessment & Plan Note (Signed)
She completed 10 day course of Augmentin in last 90 days Please take Doxycycline as directed. Please use Albuterol and Tessalon as needed. Increase fluids/rest/vt c-2,000mg /day when not feeling well. Alternate OTC Acetaminophen and Ibuprofen as needed. If symptoms persist after antibiotic completed, then please call clinic.

## 2017-11-21 NOTE — Patient Instructions (Signed)
Acute Bronchitis, Adult Acute bronchitis is sudden (acute) swelling of the air tubes (bronchi) in the lungs. Acute bronchitis causes these tubes to fill with mucus, which can make it hard to breathe. It can also cause coughing or wheezing. In adults, acute bronchitis usually goes away within 2 weeks. A cough caused by bronchitis may last up to 3 weeks. Smoking, allergies, and asthma can make the condition worse. Repeated episodes of bronchitis may cause further lung problems, such as chronic obstructive pulmonary disease (COPD). What are the causes? This condition can be caused by germs and by substances that irritate the lungs, including:  Cold and flu viruses. This condition is most often caused by the same virus that causes a cold.  Bacteria.  Exposure to tobacco smoke, dust, fumes, and air pollution.  What increases the risk? This condition is more likely to develop in people who:  Have close contact with someone with acute bronchitis.  Are exposed to lung irritants, such as tobacco smoke, dust, fumes, and vapors.  Have a weak immune system.  Have a respiratory condition such as asthma.  What are the signs or symptoms? Symptoms of this condition include:  A cough.  Coughing up clear, yellow, or green mucus.  Wheezing.  Chest congestion.  Shortness of breath.  A fever.  Body aches.  Chills.  A sore throat.  How is this diagnosed? This condition is usually diagnosed with a physical exam. During the exam, your health care provider may order tests, such as chest X-rays, to rule out other conditions. He or she may also:  Test a sample of your mucus for bacterial infection.  Check the level of oxygen in your blood. This is done to check for pneumonia.  Do a chest X-ray or lung function testing to rule out pneumonia and other conditions.  Perform blood tests.  Your health care provider will also ask about your symptoms and medical history. How is this  treated? Most cases of acute bronchitis clear up over time without treatment. Your health care provider may recommend:  Drinking more fluids. Drinking more makes your mucus thinner, which may make it easier to breathe.  Taking a medicine for a fever or cough.  Taking an antibiotic medicine.  Using an inhaler to help improve shortness of breath and to control a cough.  Using a cool mist vaporizer or humidifier to make it easier to breathe.  Follow these instructions at home: Medicines  Take over-the-counter and prescription medicines only as told by your health care provider.  If you were prescribed an antibiotic, take it as told by your health care provider. Do not stop taking the antibiotic even if you start to feel better. General instructions  Get plenty of rest.  Drink enough fluids to keep your urine clear or pale yellow.  Avoid smoking and secondhand smoke. Exposure to cigarette smoke or irritating chemicals will make bronchitis worse. If you smoke and you need help quitting, ask your health care provider. Quitting smoking will help your lungs heal faster.  Use an inhaler, cool mist vaporizer, or humidifier as told by your health care provider.  Keep all follow-up visits as told by your health care provider. This is important. How is this prevented? To lower your risk of getting this condition again:  Wash your hands often with soap and water. If soap and water are not available, use hand sanitizer.  Avoid contact with people who have cold symptoms.  Try not to touch your hands to your   mouth, nose, or eyes.  Make sure to get the flu shot every year.  Contact a health care provider if:  Your symptoms do not improve in 2 weeks of treatment. Get help right away if:  You cough up blood.  You have chest pain.  You have severe shortness of breath.  You become dehydrated.  You faint or keep feeling like you are going to faint.  You keep vomiting.  You have a  severe headache.  Your fever or chills gets worse. This information is not intended to replace advice given to you by your health care provider. Make sure you discuss any questions you have with your health care provider. Document Released: 08/19/2004 Document Revised: 02/04/2016 Document Reviewed: 12/31/2015 Elsevier Interactive Patient Education  2018 [employer].    Sinusitis, Adult Sinusitis is soreness and inflammation of your sinuses. Sinuses are hollow spaces in the bones around your face. Your sinuses are located:  Around your eyes.  In the middle of your forehead.  Behind your nose.  In your cheekbones.  Your sinuses and nasal passages are lined with a stringy fluid (mucus). Mucus normally drains out of your sinuses. When your nasal tissues become inflamed or swollen, the mucus can become trapped or blocked so air cannot flow through your sinuses. This allows bacteria, viruses, and funguses to grow, which leads to infection. Sinusitis can develop quickly and last for 7?10 days (acute) or for more than 12 weeks (chronic). Sinusitis often develops after a cold. What are the causes? This condition is caused by anything that creates swelling in the sinuses or stops mucus from draining, including:  Allergies.  Asthma.  Bacterial or viral infection.  Abnormally shaped bones between the nasal passages.  Nasal growths that contain mucus (nasal polyps).  Narrow sinus openings.  Pollutants, such as chemicals or irritants in the air.  A foreign object stuck in the nose.  A fungal infection. This is rare.  What increases the risk? The following factors may make you more likely to develop this condition:  Having allergies or asthma.  Having had a recent cold or respiratory tract infection.  Having structural deformities or blockages in your nose or sinuses.  Having a weak immune system.  Doing a lot of swimming or diving.  Overusing nasal  sprays.  Smoking.  What are the signs or symptoms? The main symptoms of this condition are pain and a feeling of pressure around the affected sinuses. Other symptoms include:  Upper toothache.  Earache.  Headache.  Bad breath.  Decreased sense of smell and taste.  A cough that may get worse at night.  Fatigue.  Fever.  Thick drainage from your nose. The drainage is often green and it may contain pus (purulent).  Stuffy nose or congestion.  Postnasal drip. This is when extra mucus collects in the throat or back of the nose.  Swelling and warmth over the affected sinuses.  Sore throat.  Sensitivity to light.  How is this diagnosed? This condition is diagnosed based on symptoms, a medical history, and a physical exam. To find out if your condition is acute or chronic, your health care provider may:  Look in your nose for signs of nasal polyps.  Tap over the affected sinus to check for signs of infection.  View the inside of your sinuses using an imaging device that has a light attached (endoscope).  If your health care provider suspects that you have chronic sinusitis, you may also:  Be tested for  allergies.  Have a sample of mucus taken from your nose (nasal culture) and checked for bacteria.  Have a mucus sample examined to see if your sinusitis is related to an allergy.  If your sinusitis does not respond to treatment and it lasts longer than 8 weeks, you may have an MRI or CT scan to check your sinuses. These scans also help to determine how severe your infection is. In rare cases, a bone biopsy may be done to rule out more serious types of fungal sinus disease. How is this treated? Treatment for sinusitis depends on the cause and whether your condition is chronic or acute. If a virus is causing your sinusitis, your symptoms will go away on their own within 10 days. You may be given medicines to relieve your symptoms, including:  Topical nasal decongestants.  They shrink swollen nasal passages and let mucus drain from your sinuses.  Antihistamines. These drugs block inflammation that is triggered by allergies. This can help to ease swelling in your nose and sinuses.  Topical nasal corticosteroids. These are nasal sprays that ease inflammation and swelling in your nose and sinuses.  Nasal saline washes. These rinses can help to get rid of thick mucus in your nose.  If your condition is caused by bacteria, you will be given an antibiotic medicine. If your condition is caused by a fungus, you will be given an antifungal medicine. Surgery may be needed to correct underlying conditions, such as narrow nasal passages. Surgery may also be needed to remove polyps. Follow these instructions at home: Medicines  Take, use, or apply over-the-counter and prescription medicines only as told by your health care provider. These may include nasal sprays.  If you were prescribed an antibiotic medicine, take it as told by your health care provider. Do not stop taking the antibiotic even if you start to feel better. Hydrate and Humidify  Drink enough water to keep your urine clear or pale yellow. Staying hydrated will help to thin your mucus.  Use a cool mist humidifier to keep the humidity level in your home above 50%.  Inhale steam for 10-15 minutes, 3-4 times a day or as told by your health care provider. You can do this in the bathroom while a hot shower is running.  Limit your exposure to cool or dry air. Rest  Rest as much as possible.  Sleep with your head raised (elevated).  Make sure to get enough sleep each night. General instructions  Apply a warm, moist washcloth to your face 3-4 times a day or as told by your health care provider. This will help with discomfort.  Wash your hands often with soap and water to reduce your exposure to viruses and other germs. If soap and water are not available, use hand sanitizer.  Do not smoke. Avoid being  around people who are smoking (secondhand smoke).  Keep all follow-up visits as told by your health care provider. This is important. Contact a health care provider if:  You have a fever.  Your symptoms get worse.  Your symptoms do not improve within 10 days. Get help right away if:  You have a severe headache.  You have persistent vomiting.  You have pain or swelling around your face or eyes.  You have vision problems.  You develop confusion.  Your neck is stiff.  You have trouble breathing. This information is not intended to replace advice given to you by your health care provider. Make sure you discuss any  questions you have with your health care provider. Document Released: 07/12/2005 Document Revised: 03/07/2016 Document Reviewed: 05/07/2015 Elsevier Interactive Patient Education  [employer].  Please take Doxycycline as directed. Please use Albuterol and Tessalon as needed. Increase fluids/rest/vt c-2,000mg /day when not feeling well. Alternate OTC Acetaminophen and Ibuprofen as needed. If symptoms persist after antibiotic completed, then please call clinic. FEEL BETTER!

## 2017-11-21 NOTE — Assessment & Plan Note (Addendum)
CXR- IMPRESSION: Mild hyperinflation and interstitial prominence consistent with reactive airway disease or chronic bronchitis. No alveolar pneumonia nor CHF. Doxycycline  Albuterol Tessalon

## 2017-12-21 ENCOUNTER — Other Ambulatory Visit: Payer: Self-pay | Admitting: Family Medicine

## 2017-12-21 DIAGNOSIS — Z1231 Encounter for screening mammogram for malignant neoplasm of breast: Secondary | ICD-10-CM

## 2017-12-28 ENCOUNTER — Ambulatory Visit (INDEPENDENT_AMBULATORY_CARE_PROVIDER_SITE_OTHER): Payer: 59 | Admitting: Family Medicine

## 2017-12-28 ENCOUNTER — Encounter: Payer: Self-pay | Admitting: Family Medicine

## 2017-12-28 VITALS — BP 114/68 | HR 74 | Ht 66.0 in | Wt 134.1 lb

## 2017-12-28 DIAGNOSIS — J3089 Other allergic rhinitis: Secondary | ICD-10-CM

## 2017-12-28 DIAGNOSIS — G8929 Other chronic pain: Secondary | ICD-10-CM | POA: Insufficient documentation

## 2017-12-28 DIAGNOSIS — M549 Dorsalgia, unspecified: Secondary | ICD-10-CM

## 2017-12-28 DIAGNOSIS — R062 Wheezing: Secondary | ICD-10-CM | POA: Diagnosis not present

## 2017-12-28 DIAGNOSIS — K219 Gastro-esophageal reflux disease without esophagitis: Secondary | ICD-10-CM | POA: Insufficient documentation

## 2017-12-28 DIAGNOSIS — R0602 Shortness of breath: Secondary | ICD-10-CM

## 2017-12-28 DIAGNOSIS — M1990 Unspecified osteoarthritis, unspecified site: Secondary | ICD-10-CM | POA: Diagnosis not present

## 2017-12-28 DIAGNOSIS — M542 Cervicalgia: Secondary | ICD-10-CM | POA: Diagnosis not present

## 2017-12-28 MED ORDER — MELOXICAM 7.5 MG PO TABS: ORAL_TABLET | ORAL | 3 refills | Status: DC

## 2017-12-28 NOTE — Patient Instructions (Addendum)
Use an N95 mask so you can decrease your exposure to allergens outside. Use a Neti pot or AYR Sinus rinse after coming inside.

## 2017-12-28 NOTE — Progress Notes (Signed)
Impression and Recommendations:    1. SOB (shortness of breath)   2. Wheeze   3. Gastroesophageal reflux disease, esophagitis presence not specified   4. Chronic back pain, unspecified back location, unspecified back pain laterality- NOW NECK   5. Chronic neck pain   6. Osteoarthritis, unspecified osteoarthritis type, unspecified site   7. Environmental and seasonal allergies     1. SOB/wheeze -sx have improved since last OV. Only seldomly uses albuterol PRN.  2. GERD- -sx stable at this time. -only uses omeprazole PRN for symptoms. Cautioned pt this medication is not intended for long-term use.     3. Chronic back and neck pain/OA -Uses meloxicam PRN for symptoms. -Cautioned pt this medication is not intended for long-term use.  -refill given.   4. Env/seasonal allergies -Use an N95 mask so you can decrease the exposure to allergens outside. Use a Neti pot or AYR Sinus rinse after coming inside.   -Contact your GI doctor, Dr. Watt Climes, and reschedule your colonoscopy and endoscopy.   -Vaccine information sheet given to pt.   -Continue your daily exercise and prudent diet. Slowly increase this over time.  -Reschedule your colonoscopy and endoscopy with Dr. Watt Climes, GI.    No orders of the defined types were placed in this encounter.   Meds ordered this encounter  Medications  . meloxicam (MOBIC) 7.5 MG tablet    Sig: Take 1-2 tablets daily for joint pains    Dispense:  180 tablet    Refill:  3    Gross side effects, risk and benefits, and alternatives of medications and treatment plan in general discussed with patient.  Patient is aware that all medications have potential side effects and we are unable to predict every side effect or drug-drug interaction that may occur.   Patient will call with any questions prior to using medication if they have concerns.  Expresses verbal understanding and consents to current therapy and treatment regimen.  No barriers to  understanding were identified.  Red flag symptoms and signs discussed in detail.  Patient expressed understanding regarding what to do in case of emergency\urgent symptoms  Please see AVS handed out to patient at the end of our visit for further patient instructions/ counseling done pertaining to today's office visit.   Return for 39mo- chronic dx f/up.    Note: This note was prepared with assistance of Dragon voice recognition software. Occasional wrong-word or sound-a-like substitutions may have occurred due to the inherent limitations of voice recognition software.    This document serves as a record of services personally performed by [name], DO. It was created on her behalf by Mayer Masker, a trained medical scribe. The creation of this record is based on the scribe's personal observations and the provider's statements to them.   I have reviewed the above medical documentation for accuracy and completeness and I concur.  [name] 01/01/18 9:57 PM  --------------------------------------------------------------------------------------------------------------------------------------------------------------------------------------------------------------------------------------------    Subjective:     HPI: Leslie Dixon is a 72 y.o. female who presents to [place] at [facility] today for issues as discussed below.  Respiratory issues She is feeling "back to normal" in the last 10 days or so. She has more energy and was able to finish her yard work. She thought she had PNA. She takes Claritin and Nasacort. She has not used her inhaler in a few weeks.   GERD She uses omeprazole PRN only about once a week or so.  She uses vinegar and water with relief to her GERD symptoms.  OA She is requesting a prescription for meloxicam for neck, shoulder, back, and knee joint pain. She does not take this every day, only about once every 7-10 days.    Diet/exercise She drinks a lot of water (2-3 16 oz cups a day) and she feels better as a result. She stopped doing the stationary bicycle due to her recent illness but is starting to get back to it now. She is walking some now as well.   Colonoscopy She had to cancel her endoscopy and colonoscopy due to her recent illness.   Wt Readings from Last 3 Encounters:  12/28/17 134 lb 1.6 oz (60.8 kg)  11/21/17 134 lb 14.4 oz (61.2 kg)  11/02/17 136 lb 4.8 oz (61.8 kg)   BP Readings from Last 3 Encounters:  12/28/17 114/68  11/21/17 (!) 98/56  11/02/17 117/68   Pulse Readings from Last 3 Encounters:  12/28/17 74  11/21/17 90  11/02/17 68   BMI Readings from Last 3 Encounters:  12/28/17 21.64 kg/m  11/21/17 21.77 kg/m  11/02/17 22.00 kg/m     Patient Care Team    Relationship Specialty Notifications Start End  [name], DO PCP - General Family Medicine  02/20/16   Heath Lark, MD Consulting Physician Hematology and Oncology  01/02/15   Suella Broad, MD Consulting Physician Physical Medicine and Rehabilitation  04/20/16    Comment: lumbar injections  Myrlene Broker, MD Attending Physician Urology  04/20/16   Tyler Pita, MD Referring Physician Physical Medicine and Rehabilitation  04/20/16    Comment: Pain medicine  Katy Apo, MD Consulting Physician Ophthalmology  04/20/16    Comment: uveitis  Juanda Chance, NP Nurse Practitioner Obstetrics and Gynecology  04/20/16   Druscilla Brownie, MD Consulting Physician Dermatology  04/20/16    Comment: Lennie Odor, NP  Carloyn Manner, MD Referring Physician Otolaryngology  02/08/17    Comment: ENT Doc  Renato Shin, MD Consulting Physician Endocrinology  02/23/17   Katy Apo, MD Consulting Physician Ophthalmology  04/22/17    Comment: sees yrly for eye exam- every fall  Laurence Spates, MD Consulting Physician Gastroenterology  05/11/17      Patient Active Problem List   Diagnosis Date Noted  . Family  history of breast cancer- sister at age 55- new onset 02/23/2017    Priority: High  . Chronic vertigo 09/21/2016    Priority: High  . h/o GAD (had panic in past) 02/12/2016    Priority: High  . Fibromyalgia 03/07/2007    Priority: High  . GERD (gastroesophageal reflux disease)     Priority: Medium  . Osteoporosis     Priority: Medium  . Chronic back pain     Priority: Medium  . LACTOSE INTOLERANCE 03/07/2007    Priority: Medium  . OA (osteoarthritis) 03/07/2007    Priority: Medium  . History of thyroid nodule- R side ( Dr Loanne Drilling eval in 10/17) 02/23/2017    Priority: Low  . ETD (Eustachian tube dysfunction), bilateral  ( R >er L ) 11/19/2016    Priority: Low  . 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 Insomnia 07/29/2016    Priority: Low  . History of non anemic vitamin B12 deficiency 05/02/2016    Priority: Low  . Environmental and seasonal allergies 04/20/2016    Priority: Low  . Idiopathic benign Leukopenia     Priority: Low  .  Ocular migraine 03/07/2007    Priority: Low  . Gastroesophageal reflux disease 12/28/2017  . Chronic neck pain 12/28/2017  . Osteoarthritis 12/28/2017  . Cough 11/21/2017  . SOB (shortness of breath) 11/21/2017  . Cervical radiculopathy 10/24/2017  . Chronic pain 10/19/2017  . DDD (degenerative disc disease), cervical 10/19/2017  . Irritable bowel syndrome with diarrhea 09/29/2017  . Migraine without status migrainosus, not intractable 09/29/2017  . Maxillary sinusitis 05/10/2017  . Postnasal discharge 05/10/2017  . BPPV (benign paroxysmal positional vertigo), right 05/10/2017  . Status post lumbar surgery- foot drop R foot 02/23/2017  . 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 Presbycusis of both ears 09/30/2016  . Right thyroid nodule 05/28/2016  . Intolerance to cold 05/02/2016  . Encounter for wellness examination 04/20/2016  . Can't get food down 04/10/2012  . Difficulty speaking  04/10/2012  . Hydronephrosis 03/22/2012  . Back pain   . Chronic infection of sinus 07/09/2011  . Blood in the urine 05/06/2011  . 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 Calculus of kidney 05/06/2011  . HYPOKALEMIA 01/03/2008  . Allergic rhinitis due to pollen 12/13/2007  . 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 CARPAL TUNNEL SYNDROME 03/07/2007  . VARICOSE VEIN 03/07/2007  . NEPHROLITHIASIS, HX OF 03/07/2007    Past Medical history, Surgical history, Family history, Social history, Allergies and Medications have been entered into the medical record, reviewed and changed as needed.    Current Meds  Medication Sig  . albuterol (PROVENTIL HFA;VENTOLIN HFA) 108 (90 Base) MCG/ACT inhaler Inhale 1-2 puffs into the lungs every 4 (four) hours as needed for wheezing or shortness of breath (only short term treatment).  . ibandronate (BONIVA) 150 MG tablet TAKE 1 TABLET BY MOUTH  EVERY 30 DAYS  . loratadine (CLARITIN) 10 MG tablet Take 10 mg by mouth daily.  . meloxicam (MOBIC) 7.5 MG tablet Take 1-2 tablets daily for joint pains  . omeprazole (PRILOSEC) 20 MG capsule Take 1 capsule (20 mg total) by mouth 2 (two) times daily before a meal.  . triamcinolone (NASACORT ALLERGY 24HR) 55 MCG/ACT AERO nasal inhaler Place 1 spray into the nose daily.  . Turmeric 500 MG CAPS Take 2 capsules by mouth daily at 8 pm.  . vitamin B-12 (CYANOCOBALAMIN) 1000 MCG tablet Take 1,000 mcg by mouth daily.  . Vitamin D, Cholecalciferol, 1000 units CAPS Take 1 capsule by mouth daily.  . vitamin E 400 UNIT capsule Take 400 Units by mouth daily. Patient takes 2 a day  . [DISCONTINUED] meloxicam (MOBIC) 7.5 MG tablet Take 1-2 tablets daily for joint pains    Allergies:  Allergies  Allergen Reactions  . Peanut Oil Anaphylaxis  . Clarithromycin     REACTION: GI  . Codeine     REACTION: nausea and vomiting  . Lactose     Other reaction(s): GI Upset (intolerance)  . Other     GLUTEN RESTRICTED LACTOSE INTOLERANT ALLERGIC  TO NUTS AND CORN  . Penicillins     REACTION: per allergy testing/Patient states she has taken Augmentin without complications.   . Pregabalin     REACTION: headache, several side effects  . Sulfa Antibiotics Other (See Comments)    REACTION: Hives, wheezing  . Sulfonamide Derivatives     REACTION: Hives, wheezing  . Cefdinir Rash     Review of Systems:  A fourteen system review of systems was performed and found to be positive as per HPI.   Objective:   Blood pressure 114/68, pulse 74,  height 5\' 6"  (1.676 m), weight 134 lb 1.6 oz (60.8 kg), SpO2 98 %. Body mass index is 21.64 kg/m. General:  Well Developed, well nourished, appropriate for stated age.  Neuro:  Alert and oriented,  extra-ocular muscles intact  HEENT:  Normocephalic, atraumatic, neck supple, no carotid bruits appreciated  Skin:  no gross rash, warm, pink. Cardiac:  RRR, S1 S2 Respiratory:  ECTA B/L and A/P, Not using accessory muscles, speaking in full sentences- unlabored. Vascular:  Ext warm, no cyanosis apprec.; cap RF less 2 sec. Psych:  No HI/SI, judgement and insight good, Euthymic mood. Full Affect.

## 2018-01-24 DIAGNOSIS — M961 Postlaminectomy syndrome, not elsewhere classified: Secondary | ICD-10-CM | POA: Diagnosis not present

## 2018-01-24 DIAGNOSIS — M5136 Other intervertebral disc degeneration, lumbar region: Secondary | ICD-10-CM | POA: Diagnosis not present

## 2018-01-31 DIAGNOSIS — Z961 Presence of intraocular lens: Secondary | ICD-10-CM | POA: Diagnosis not present

## 2018-01-31 DIAGNOSIS — H43812 Vitreous degeneration, left eye: Secondary | ICD-10-CM | POA: Diagnosis not present

## 2018-02-24 DIAGNOSIS — G43109 Migraine with aura, not intractable, without status migrainosus: Secondary | ICD-10-CM | POA: Diagnosis not present

## 2018-02-28 DIAGNOSIS — G43109 Migraine with aura, not intractable, without status migrainosus: Secondary | ICD-10-CM | POA: Diagnosis not present

## 2018-03-03 DIAGNOSIS — J301 Allergic rhinitis due to pollen: Secondary | ICD-10-CM | POA: Diagnosis not present

## 2018-03-06 ENCOUNTER — Ambulatory Visit
Admission: RE | Admit: 2018-03-06 | Discharge: 2018-03-06 | Disposition: A | Discharge status: Home / Self Care | Payer: 59 | Source: Ambulatory Visit | Attending: Family Medicine | Admitting: Family Medicine

## 2018-03-06 DIAGNOSIS — Z1231 Encounter for screening mammogram for malignant neoplasm of breast: Secondary | ICD-10-CM

## 2018-04-19 DIAGNOSIS — Z87442 Personal history of urinary calculi: Secondary | ICD-10-CM | POA: Diagnosis not present

## 2018-04-19 DIAGNOSIS — N1339 Other hydronephrosis: Secondary | ICD-10-CM | POA: Diagnosis not present

## 2018-04-19 DIAGNOSIS — Z87440 Personal history of urinary (tract) infections: Secondary | ICD-10-CM | POA: Diagnosis not present

## 2018-04-19 DIAGNOSIS — N200 Calculus of kidney: Secondary | ICD-10-CM | POA: Diagnosis not present

## 2018-04-19 DIAGNOSIS — Q631 Lobulated, fused and horseshoe kidney: Secondary | ICD-10-CM | POA: Diagnosis not present

## 2018-04-25 ENCOUNTER — Encounter: Payer: Self-pay | Admitting: Family Medicine

## 2018-04-25 ENCOUNTER — Ambulatory Visit (INDEPENDENT_AMBULATORY_CARE_PROVIDER_SITE_OTHER): Payer: 59 | Admitting: Family Medicine

## 2018-04-25 VITALS — BP 101/63 | HR 60 | Ht 66.0 in | Wt 135.0 lb

## 2018-04-25 DIAGNOSIS — Z23 Encounter for immunization: Secondary | ICD-10-CM

## 2018-04-25 DIAGNOSIS — E7800 Pure hypercholesterolemia, unspecified: Secondary | ICD-10-CM | POA: Diagnosis not present

## 2018-04-25 DIAGNOSIS — E2839 Other primary ovarian failure: Secondary | ICD-10-CM | POA: Diagnosis not present

## 2018-04-25 DIAGNOSIS — G43109 Migraine with aura, not intractable, without status migrainosus: Secondary | ICD-10-CM | POA: Diagnosis not present

## 2018-04-25 DIAGNOSIS — M150 Primary generalized (osteo)arthritis: Secondary | ICD-10-CM

## 2018-04-25 DIAGNOSIS — M810 Age-related osteoporosis without current pathological fracture: Secondary | ICD-10-CM | POA: Diagnosis not present

## 2018-04-25 DIAGNOSIS — F411 Generalized anxiety disorder: Secondary | ICD-10-CM

## 2018-04-25 DIAGNOSIS — R42 Dizziness and giddiness: Secondary | ICD-10-CM | POA: Diagnosis not present

## 2018-04-25 DIAGNOSIS — K219 Gastro-esophageal reflux disease without esophagitis: Secondary | ICD-10-CM | POA: Diagnosis not present

## 2018-04-25 DIAGNOSIS — M159 Polyosteoarthritis, unspecified: Secondary | ICD-10-CM

## 2018-04-25 MED ORDER — IBANDRONATE SODIUM 150 MG PO TABS: ORAL_TABLET | ORAL | 1 refills | Status: DC

## 2018-04-25 NOTE — Patient Instructions (Addendum)
- please place FBW near future including Mag level - thnx Melissa!!     Osteoporosis Osteoporosis is the thinning and loss of density in the bones. Osteoporosis makes the bones more brittle, fragile, and likely to break (fracture). Over time, osteoporosis can cause the bones to become so weak that they fracture after a simple fall. The bones most likely to fracture are the bones in the hip, wrist, and spine. What are the causes? The exact cause is not known. What increases the risk? Anyone can develop osteoporosis. You may be at greater risk if you have a family history of the condition or have poor nutrition. You may also have a higher risk if you are:  Female.  73 years old or older.  A smoker.  Not physically active.  White or Asian.  Slender.  What are the signs or symptoms? A fracture might be the first sign of the disease, especially if it results from a fall or injury that would not usually cause a bone to break. Other signs and symptoms include:  Low back and neck pain.  Stooped posture.  Height loss.  How is this diagnosed? To make a diagnosis, your health care provider may:  Take a medical history.  Perform a physical exam.  Order tests, such as: ? A bone mineral density test. ? A dual-energy X-ray absorptiometry test.  How is this treated? The goal of osteoporosis treatment is to strengthen your bones to reduce your risk of a fracture. Treatment may involve:  Making lifestyle changes, such as: ? Eating a diet rich in calcium. ? Doing weight-bearing and muscle-strengthening exercises. ? Stopping tobacco use. ? Limiting alcohol intake.  Taking medicine to slow the process of bone loss or to increase bone density.  Monitoring your levels of calcium and vitamin D.  Follow these instructions at home:  Include calcium and vitamin D in your diet. Calcium is important for bone health, and vitamin D helps the body absorb calcium.  Perform weight-bearing  and muscle-strengthening exercises as directed by your health care provider.  Do not use any tobacco products, including cigarettes, chewing tobacco, and electronic cigarettes. If you need help quitting, ask your health care provider.  Limit your alcohol intake.  Take medicines only as directed by your health care provider.  Keep all follow-up visits as directed by your health care provider. This is important.  Take precautions at home to lower your risk of falling, such as: ? Keeping rooms well lit and clutter free. ? Installing safety rails on stairs. ? Using rubber mats in the bathroom and other areas that are often wet or slippery. Get help right away if: You fall or injure yourself. This information is not intended to replace advice given to you by your health care provider. Make sure you discuss any questions you have with your health care provider. Document Released: 04/21/2005 Document Revised: 12/15/2015 Document Reviewed: 12/20/2013 Elsevier Interactive Patient Education  2018 [employer].    Ibandronate tablets What is this medicine? IBANDRONATE (i BAN droh nate) slows calcium loss from bones. It is used to treat osteoporosis in women past the age of menopause. This medicine may be used for other purposes; ask your health care provider or pharmacist if you have questions. COMMON BRAND NAME(S): Boniva What should I tell my health care provider before I take this medicine? They need to know if you have any of these conditions: -dental disease -esophagus, stomach, or intestine problems, like acid reflux or GERD -kidney disease -  low blood calcium -low vitamin D -problems sitting or standing for 60 minutes -trouble swallowing -an unusual or allergic reaction to ibandronate, other medicines, foods, dyes, or preservatives -pregnant or trying to get pregnant -breast-feeding How should I use this medicine? You must take this medicine exactly as directed or you will lower  the amount of medicine you absorb into your body or you may cause yourself harm. Take your dose by mouth first thing in the morning, after you are up for the day. Do not eat or drink anything before you take this medicine. Swallow the tablet with a full glass (6 to 8 ounces) of plain water. Do not take this medicine with any other drink. Do not chew or crush the tablet. After taking this medicine, do not eat breakfast, drink, or take any other medicines or vitamins for at least 1 hour. Stand or sit up for at least 1 hour after taking this medicine; do not lie down. Do not take your medicine more often than directed. Talk to your pediatrician regarding the use of this medicine in children. Special care may be needed. Overdosage: If you think you have taken too much of this medicine contact a poison control center or emergency room at once. NOTE: This medicine is only for you. Do not share this medicine with others. What if I miss a dose? If you miss a dose, do not take it later in the day. Continue your normal schedule starting the next morning. Do not take double or extra doses. What may interact with this medicine? -aluminum hydroxide -antacids -aspirin -calcium supplements -drugs for inflammation like ibuprofen, naproxen, and others -iron supplements -magnesium supplements -vitamins with minerals This list may not describe all possible interactions. Give your health care provider a list of all the medicines, herbs, non-prescription drugs, or dietary supplements you use. Also tell them if you smoke, drink alcohol, or use illegal drugs. Some items may interact with your medicine. What should I watch for while using this medicine? Visit your doctor or health care professional for regular check ups. It may be some time before you see the benefit from this medicine. Do not stop taking your medicine except on your doctor's advice. Your doctor or health care professional may order blood tests and other  tests to see how you are doing. You should make sure you get enough calcium and vitamin D while you are taking this medicine, unless your doctor tells you not to. Discuss the foods you eat and the vitamins you take with your health care professional. Some people who take this medicine have severe bone, joint, and/or muscle pain. This medicine may also increase your risk for a broken thigh bone. Tell your doctor right away if you have pain in your upper leg or groin. Tell your doctor if you have any pain that does not go away or that gets worse. What side effects may I notice from receiving this medicine? Side effects that you should report to your doctor or health care professional as soon as possible: -allergic reactions such as skin rash or itching, hives, swelling of the face, lips, throat, or tongue -black or tarry stools -bone, joint, or muscle pain -changes in vision -chest pain -heartburn or stomach pain -jaw pain, especially after dental work -pain or trouble when swallowing Side effects that usually do not require medical attention (report to your doctor or health care professional if they continue or are bothersome): -diarrhea or constipation -eye pain or itching -headache -nausea or  vomiting -trouble sleeping This list may not describe all possible side effects. Call your doctor for medical advice about side effects. You may report side effects to FDA at 1-800-FDA-1088. Where should I keep my medicine? Keep out of the reach of children. Store at room temperature between 15 and 30 degrees C (59 and 86 degrees F). Throw away any unused medicine after the expiration date. NOTE: This sheet is a summary. It may not cover all possible information. If you have questions about this medicine, talk to your doctor, pharmacist, or health care provider.  2018 Elsevier/Gold Standard (2011-01-08 09:03:41)

## 2018-04-25 NOTE — Progress Notes (Addendum)
Impression and Recommendations:    1. h/o GAD (had panic in past)   2. Gastroesophageal reflux disease, esophagitis presence not specified   3. Primary osteoarthritis involving multiple joints   4. Age related osteoporosis, unspecified pathological fracture presence   5. Ocular migraine   6. Need for shingles vaccine   7. Flu vaccine need   8. Estrogen deficiency   9. Chronic vertigo   10. Magnesium disorder   11. Elevated LDL cholesterol level      h/o GAD (had panic in past)  Gastroesophageal reflux disease, esophagitis presence not specified - Plan: CBC with Differential/Platelet, Comprehensive metabolic panel  Primary osteoarthritis involving multiple joints - Plan: CBC with Differential/Platelet, Comprehensive metabolic panel, Hemoglobin A1c, Lipid panel, T4, free, TSH, Vitamin B12, VITAMIN D 25 Hydroxy (Vit-D Deficiency, Fractures), T3, free, Magnesium, Phosphorus  Age related osteoporosis, unspecified pathological fracture presence - Plan: CBC with Differential/Platelet, Comprehensive metabolic panel, Hemoglobin A1c, Lipid panel, T4, free, TSH, Vitamin B12, VITAMIN D 25 Hydroxy (Vit-D Deficiency, Fractures), T3, free, Magnesium, Phosphorus  Ocular migraine  Need for shingles vaccine - Plan: Varicella-zoster vaccine IM (Shingrix)  Flu vaccine need - Plan: Flu vaccine HIGH DOSE PF (Fluzone High dose)  Estrogen deficiency - Plan: DG Bone Density, CBC with Differential/Platelet, Comprehensive metabolic panel, Hemoglobin A1c, Lipid panel, T4, free, TSH, Vitamin B12, VITAMIN D 25 Hydroxy (Vit-D Deficiency, Fractures), T3, free, Magnesium, Phosphorus  Chronic vertigo - Plan: CBC with Differential/Platelet, Comprehensive metabolic panel, Hemoglobin A1c, Lipid panel, T4, free, TSH, Vitamin B12, VITAMIN D 25 Hydroxy (Vit-D Deficiency, Fractures), T3, free, Magnesium, Phosphorus  Magnesium disorder - Plan: Comprehensive metabolic panel, Magnesium, Phosphorus  Elevated LDL  cholesterol level - Plan: Lipid panel   1. GERD, esophagitis presence not specified -Well controlled, following up with GI.  -Patient will have an EGD performed by Dr. Watt Climes tomorrow, 04/26/2018.  -She will have her EGD reports sent to [place].   2. Primary osteoarthritis involving multiple joints:  -Advised patient to continue with daily exercising to improve health.    -Will place a referral today for bone density scan and Boniva prescribed today.    -Patient to continue on Mobic PRN. Advised to continue on Tumeric and Vitamin D3 as previously advised.   3. Ocular Migraine:  -She uses Imitrex once or twice a month PRN, which she is tolerating well.   -Patient to continue on Imitrex as needed.    4. Vaccinations:  -Patient will obtain her flu and shringrix vaccination in the office today.   Follow up for lab only visit for fasting labs with a Chronic OV following.   Orders Placed This Encounter  Procedures  . DG Bone Density  . Flu vaccine HIGH DOSE PF (Fluzone High dose)  . Varicella-zoster vaccine IM (Shingrix)  . CBC with Differential/Platelet  . Comprehensive metabolic panel  . Hemoglobin A1c  . Lipid panel  . T4, free  . TSH  . Vitamin B12  . VITAMIN D 25 Hydroxy (Vit-D Deficiency, Fractures)  . T3, free  . Magnesium  . Phosphorus    Meds ordered this encounter  Medications  . ibandronate (BONIVA) 150 MG tablet    Sig: TAKE 1 TABLET BY MOUTH  EVERY 30 DAYS    Dispense:  3 tablet    Refill:  1    Medications Discontinued During This Encounter  Medication Reason  . ibandronate (BONIVA) 150 MG tablet Reorder     Gross side effects, risk and benefits, and alternatives of  medications and treatment plan in general discussed with patient.  Patient is aware that all medications have potential side effects and we are unable to predict every side effect or drug-drug interaction that may occur. Patient will call with any questions prior to using medication if they have  concerns.    Expresses verbal understanding and consents to current therapy and treatment regimen. No barriers to understanding were identified.  Red flag symptoms and signs discussed in detail.  Patient expressed understanding regarding what to do in case of emergency\urgent symptoms  Please see AVS handed out to patient at the end of our visit for further patient instructions/ counseling done pertaining to today's office visit.   Return 4-6wks OV with me w FBW 3-5 d prior, for f/up to review bone density, labs, recent GI tests etc.     Note:  This note was prepared with assistance of Dragon voice recognition software. Occasional wrong-word or sound-a-like substitutions may have occurred due to the inherent limitations of voice recognition software.  This document serves as a record of services personally performed by [name], DO. It was created on her behalf by Steva Colder, a trained medical scribe. The creation of this record is based on the scribe's personal observations and the provider's statements to them.   I have reviewed the above medical documentation for accuracy and completeness and I concur.  [name], D.O.    --------------------------------------------------------------------------------------------------------------------------------------------------------------------------------------------------------------------------------------------    Subjective:     HPI: Leslie Dixon is a 72 y.o. female who presents to [place] [facility] today for issues as discussed below. She notes that she has been doing well overall. She would like to obtain her flu vaccination and shringrix. She notes that she reads a lot, completes puzzles, as well as volunteering at [employer].   She is having an endoscopy tomorrow, 10/2/109 with Dr. Watt Climes to whom she is seeing due to dysphagia. She has trouble eating breads and taking some  pills.   OA:  She hasn't been to her GYN since her GYN retired recently, however, she is due to a done density exam this year. She is on boniva and vitamin D3. She reports that she is still having stiffness. Pt is taking Mobic PRN. She is taking 2,000 mg tumeric daily and 5,000 IUs of Vitamin D3. She is also taking Vitamin E BID.   Exercise management:  She is using her recumbent bicycle and using it for 8 minutes total now. She is also walking as well however, she hasn't been doing it so much due to the heat. She also has two schnauzers and she sometimes has to carry one of her 5 lb dog up and down the steps. She also cleans her own home. She also does yard maintenance and upkeep.    Ocular migraine:  She notes that she has consumed a half a banana everyday that seems to lessen her hx of migraines. She also takes her Imitrex PRN.   Dental pain:  She notes that she need a root canal and she is unable to only take abx to aid with treatment. She will maintain follow up with her dentist.    Wt Readings from Last 3 Encounters:  05/31/18 135 lb 12.8 oz (61.6 kg)  05/26/18 136 lb 12.8 oz (62.1 kg)  04/25/18 135 lb (61.2 kg)   BP Readings from Last 3 Encounters:  05/31/18 105/63  05/26/18 104/60  04/25/18 101/63   Pulse Readings from  Last 3 Encounters:  05/31/18 66  05/26/18 68  04/25/18 60   BMI Readings from Last 3 Encounters:  05/31/18 21.92 kg/m  05/26/18 22.08 kg/m  04/25/18 21.79 kg/m     Patient Care Team    Relationship Specialty Notifications Start End  [name], DO PCP - General Family Medicine  02/20/16   Heath Lark, MD Consulting Physician Hematology and Oncology  01/02/15   Suella Broad, MD Consulting Physician Physical Medicine and Rehabilitation  04/20/16    Comment: lumbar injections  Myrlene Broker, MD Attending Physician Urology  04/20/16   Tyler Pita, MD Referring Physician Physical Medicine and Rehabilitation  04/20/16    Comment: Pain  medicine  Katy Apo, MD Consulting Physician Ophthalmology  04/20/16    Comment: uveitis  Juanda Chance, NP Nurse Practitioner Obstetrics and Gynecology  04/20/16   Druscilla Brownie, MD Consulting Physician Dermatology  04/20/16    Comment: Lennie Odor, NP  Carloyn Manner, MD Referring Physician Otolaryngology  02/08/17    Comment: ENT Doc  Renato Shin, MD Consulting Physician Endocrinology  02/23/17   Katy Apo, MD Consulting Physician Ophthalmology  04/22/17    Comment: sees yrly for eye exam- every fall  Laurence Spates, MD Consulting Physician Gastroenterology  05/11/17      Patient Active Problem List   Diagnosis Date Noted  . Family history of breast cancer- sister at age 90- new onset 02/23/2017    Priority: High  . Chronic vertigo 09/21/2016    Priority: High  . h/o GAD (had panic in past) 02/12/2016    Priority: High  . Fibromyalgia 03/07/2007    Priority: High  . GERD (gastroesophageal reflux disease)     Priority: Medium  . Osteoporosis     Priority: Medium  . Chronic back pain     Priority: Medium  . LACTOSE INTOLERANCE 03/07/2007    Priority: Medium  . OA (osteoarthritis) 03/07/2007    Priority: Medium  . History of thyroid nodule- R side ( Dr Loanne Drilling eval in 10/17) 02/23/2017    Priority: Low  . ETD (Eustachian tube dysfunction), bilateral  ( R >er L ) 11/19/2016    Priority: Low  . 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 Insomnia 07/29/2016    Priority: Low  . History of non anemic vitamin B12 deficiency 05/02/2016    Priority: Low  . Environmental and seasonal allergies 04/20/2016    Priority: Low  . Idiopathic benign Leukopenia     Priority: Low  . Ocular migraine 03/07/2007    Priority: Low  . Elevated HDL 05/31/2018  . Panic attack as reaction to stress 05/31/2018  . Lumbar post-laminectomy syndrome 01/24/2018  . Gastroesophageal reflux disease 12/28/2017  . Chronic neck pain 12/28/2017  .  Osteoarthritis 12/28/2017  . Cough 11/21/2017  . SOB (shortness of breath) 11/21/2017  . Cervical radiculopathy 10/24/2017  . Chronic pain 10/19/2017  . DDD (degenerative disc disease), cervical 10/19/2017  . Irritable bowel syndrome with diarrhea 09/29/2017  . Migraine without status migrainosus, not intractable 09/29/2017  . Maxillary sinusitis 05/10/2017  . Postnasal discharge 05/10/2017  . BPPV (benign paroxysmal positional vertigo), right 05/10/2017  . Status post lumbar surgery- foot drop R foot 02/23/2017  . 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 Presbycusis of both ears 09/30/2016  . Right thyroid nodule 05/28/2016  . Intolerance to cold 05/02/2016  . Encounter for wellness examination 04/20/2016  . Can't get  food down 04/10/2012  . Difficulty speaking 04/10/2012  . Hydronephrosis 03/22/2012  . Back pain   . Chronic infection of sinus 07/09/2011  . Blood in the urine 05/06/2011  . 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 Calcium kidney stones 05/06/2011  . HYPOKALEMIA 01/03/2008  . Allergic rhinitis due to pollen 12/13/2007  . 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 CARPAL TUNNEL SYNDROME 03/07/2007  . VARICOSE VEIN 03/07/2007  . NEPHROLITHIASIS, HX OF 03/07/2007    Past Medical history, Surgical history, Family history, Social history, Allergies and Medications have been entered into the medical record, reviewed and changed as needed.    Current Meds  Medication Sig  . albuterol (PROVENTIL HFA;VENTOLIN HFA) 108 (90 Base) MCG/ACT inhaler Inhale 1-2 puffs into the lungs every 4 (four) hours as needed for wheezing or shortness of breath (only short term treatment).  . ibandronate (BONIVA) 150 MG tablet TAKE 1 TABLET BY MOUTH  EVERY 30 DAYS  . loratadine (CLARITIN) 10 MG tablet Take 10 mg by mouth daily.  . meloxicam (MOBIC) 7.5 MG tablet Take 1-2 tablets daily for joint pains  . omeprazole (PRILOSEC) 20 MG capsule Take 1 capsule (20 mg total) by mouth 2 (two) times daily before  a meal.  . SUMAtriptan (IMITREX) 100 MG tablet TAKE 1 2 (ONE HALF) TABLET BY MOUTH AS NEEDED FOR MIGRAINE  . triamcinolone (NASACORT ALLERGY 24HR) 55 MCG/ACT AERO nasal inhaler Place 1 spray into the nose daily.  . TURMERIC PO Take 1 tablet by mouth daily at 8 pm.   . vitamin B-12 (CYANOCOBALAMIN) 1000 MCG tablet Take 1,000 mcg by mouth daily.  . Vitamin D, Cholecalciferol, 1000 units CAPS Take 1 capsule by mouth daily.   . vitamin E 400 UNIT capsule Take 400 Units by mouth daily. Patient takes 2 a day  . [DISCONTINUED] ibandronate (BONIVA) 150 MG tablet TAKE 1 TABLET BY MOUTH  EVERY 30 DAYS    Allergies:  Allergies  Allergen Reactions  . Peanut Oil Anaphylaxis  . Clarithromycin     REACTION: GI  . Codeine     REACTION: nausea and vomiting  . Lactose     Other reaction(s): GI Upset (intolerance)  . 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 Penicillins     REACTION: per allergy testing/Patient states she has taken Augmentin without complications.   . Pregabalin     REACTION: headache, several side effects  . Sulfa Antibiotics Other (See Comments)    REACTION: Hives, wheezing  . Sulfonamide Derivatives     REACTION: Hives, wheezing  . Cefdinir Rash     Review of Systems:  A fourteen system review of systems was performed and found to be positive as per HPI.   Objective:   Blood pressure 101/63, pulse 60, height 5\' 6"  (1.676 m), weight 135 lb (61.2 kg), SpO2 100 %. Body mass index is 21.79 kg/m. General:  Well Developed, well nourished, appropriate for stated age.  Neuro:  Alert and oriented,  extra-ocular muscles intact  HEENT:  Normocephalic, atraumatic, neck supple, no carotid bruits appreciated  Skin:  no gross rash, warm, pink. Cardiac:  RRR, S1 S2 Respiratory:  ECTA B/L and A/P, Not using accessory muscles, speaking in full sentences- unlabored. Vascular:  Ext warm, no cyanosis apprec.; cap RF less 2 sec. Psych:  No HI/SI,  judgement and insight good, Euthymic mood. Full Affect.

## 2018-04-26 DIAGNOSIS — R1310 Dysphagia, unspecified: Secondary | ICD-10-CM | POA: Diagnosis not present

## 2018-04-26 DIAGNOSIS — R1013 Epigastric pain: Secondary | ICD-10-CM | POA: Diagnosis not present

## 2018-04-26 DIAGNOSIS — K2940 Chronic atrophic gastritis without bleeding: Secondary | ICD-10-CM | POA: Diagnosis not present

## 2018-04-26 DIAGNOSIS — K224 Dyskinesia of esophagus: Secondary | ICD-10-CM | POA: Diagnosis not present

## 2018-05-03 ENCOUNTER — Ambulatory Visit
Admission: RE | Admit: 2018-05-03 | Discharge: 2018-05-03 | Disposition: A | Discharge status: Home / Self Care | Payer: 59 | Source: Ambulatory Visit | Attending: Family Medicine | Admitting: Family Medicine

## 2018-05-03 DIAGNOSIS — M810 Age-related osteoporosis without current pathological fracture: Secondary | ICD-10-CM | POA: Diagnosis not present

## 2018-05-03 DIAGNOSIS — E2839 Other primary ovarian failure: Secondary | ICD-10-CM

## 2018-05-23 ENCOUNTER — Other Ambulatory Visit: Payer: 59

## 2018-05-23 DIAGNOSIS — E2839 Other primary ovarian failure: Secondary | ICD-10-CM

## 2018-05-23 DIAGNOSIS — K219 Gastro-esophageal reflux disease without esophagitis: Secondary | ICD-10-CM

## 2018-05-23 DIAGNOSIS — E7800 Pure hypercholesterolemia, unspecified: Secondary | ICD-10-CM

## 2018-05-23 DIAGNOSIS — M810 Age-related osteoporosis without current pathological fracture: Secondary | ICD-10-CM

## 2018-05-23 DIAGNOSIS — R42 Dizziness and giddiness: Secondary | ICD-10-CM

## 2018-05-23 DIAGNOSIS — M150 Primary generalized (osteo)arthritis: Principal | ICD-10-CM

## 2018-05-23 DIAGNOSIS — M159 Polyosteoarthritis, unspecified: Secondary | ICD-10-CM

## 2018-05-24 DIAGNOSIS — R1013 Epigastric pain: Secondary | ICD-10-CM | POA: Diagnosis not present

## 2018-05-24 DIAGNOSIS — Z8371 Family history of colonic polyps: Secondary | ICD-10-CM | POA: Diagnosis not present

## 2018-05-24 LAB — CBC WITH DIFFERENTIAL/PLATELET
BASOS ABS: 0 10*3/uL (ref 0.0–0.2)
BASOS: 0 %
EOS (ABSOLUTE): 0.1 10*3/uL (ref 0.0–0.4)
Eos: 2 %
Hematocrit: 41.3 % (ref 34.0–46.6)
Hemoglobin: 14 g/dL (ref 11.1–15.9)
IMMATURE GRANS (ABS): 0 10*3/uL (ref 0.0–0.1)
IMMATURE GRANULOCYTES: 0 %
LYMPHS: 43 %
Lymphocytes Absolute: 1.3 10*3/uL (ref 0.7–3.1)
MCH: 29.7 pg (ref 26.6–33.0)
MCHC: 33.9 g/dL (ref 31.5–35.7)
MCV: 88 fL (ref 79–97)
Monocytes Absolute: 0.2 10*3/uL (ref 0.1–0.9)
Monocytes: 8 %
Neutrophils Absolute: 1.4 10*3/uL (ref 1.4–7.0)
Neutrophils: 47 %
Platelets: 191 10*3/uL (ref 150–450)
RBC: 4.72 x10E6/uL (ref 3.77–5.28)
RDW: 15.2 % (ref 12.3–15.4)
WBC: 3.1 10*3/uL — ABNORMAL LOW (ref 3.4–10.8)

## 2018-05-24 LAB — LIPID PANEL
Chol/HDL Ratio: 2.1 ratio (ref 0.0–4.4)
Cholesterol, Total: 172 mg/dL (ref 100–199)
HDL: 81 mg/dL (ref >39)
LDL CALC: 84 mg/dL (ref 0–99)
Triglycerides: 35 mg/dL (ref 0–149)
VLDL CHOLESTEROL CAL: 7 mg/dL (ref 5–40)

## 2018-05-24 LAB — COMPREHENSIVE METABOLIC PANEL
A/G RATIO: 2.1 (ref 1.2–2.2)
ALT: 12 IU/L (ref 0–32)
AST: 17 IU/L (ref 0–40)
Albumin: 4.1 g/dL (ref 3.5–4.8)
Alkaline Phosphatase: 58 IU/L (ref 39–117)
BILIRUBIN TOTAL: 0.4 mg/dL (ref 0.0–1.2)
BUN / CREAT RATIO: 24 (ref 12–28)
BUN: 20 mg/dL (ref 8–27)
CALCIUM: 9 mg/dL (ref 8.7–10.3)
CHLORIDE: 108 mmol/L — AB (ref 96–106)
CO2: 22 mmol/L (ref 20–29)
Creatinine, Ser: 0.84 mg/dL (ref 0.57–1.00)
GFR, EST AFRICAN AMERICAN: 79 mL/min/{1.73_m2} (ref >59)
GFR, EST NON AFRICAN AMERICAN: 69 mL/min/{1.73_m2} (ref >59)
GLOBULIN, TOTAL: 2 g/dL (ref 1.5–4.5)
Glucose: 83 mg/dL (ref 65–99)
POTASSIUM: 4.2 mmol/L (ref 3.5–5.2)
SODIUM: 142 mmol/L (ref 134–144)
TOTAL PROTEIN: 6.1 g/dL (ref 6.0–8.5)

## 2018-05-24 LAB — PHOSPHORUS: PHOSPHORUS: 3.4 mg/dL (ref 2.5–4.5)

## 2018-05-24 LAB — VITAMIN B12: Vitamin B-12: 737 pg/mL (ref 232–1245)

## 2018-05-24 LAB — TSH: TSH: 2.44 u[IU]/mL (ref 0.450–4.500)

## 2018-05-24 LAB — T3, FREE: T3 FREE: 2.6 pg/mL (ref 2.0–4.4)

## 2018-05-24 LAB — T4, FREE: FREE T4: 1.18 ng/dL (ref 0.82–1.77)

## 2018-05-24 LAB — MAGNESIUM: Magnesium: 2.3 mg/dL (ref 1.6–2.3)

## 2018-05-24 LAB — HEMOGLOBIN A1C
ESTIMATED AVERAGE GLUCOSE: 108 mg/dL
HEMOGLOBIN A1C: 5.4 % (ref 4.8–5.6)

## 2018-05-24 LAB — VITAMIN D 25 HYDROXY (VIT D DEFICIENCY, FRACTURES): VIT D 25 HYDROXY: 46.7 ng/mL (ref 30.0–100.0)

## 2018-05-26 ENCOUNTER — Encounter: Payer: Self-pay | Admitting: Endocrinology

## 2018-05-26 ENCOUNTER — Ambulatory Visit (INDEPENDENT_AMBULATORY_CARE_PROVIDER_SITE_OTHER): Payer: 59 | Admitting: Endocrinology

## 2018-05-26 VITALS — BP 104/60 | HR 68 | Ht 66.0 in | Wt 136.8 lb

## 2018-05-26 DIAGNOSIS — E041 Nontoxic single thyroid nodule: Secondary | ICD-10-CM

## 2018-05-26 NOTE — Progress Notes (Signed)
Subjective:    Patient ID: Leslie Dixon, female    DOB: August 17, 1945, 72 y.o.   MRN: [id number]  HPI Pt returns for f/u of thyroid nodule (dx'ed 2017; was not big enough for bx; she is euthyroid off rx). she says she does not notice the thyroid nodule Past Medical History:  Diagnosis Date  . Blood dyscrasia    LEUKOPENIA- DR. CHISM - NO TREATMENT BUT IS MONITORED  . Chronic back pain   . Chronic back pain   . Fibromyalgia   . Foot drop, right    RELATED TO LUMBAR PROBLEMS  . GERD (gastroesophageal reflux disease)    PRILOSEC PRN  . History of kidney stones   . Horseshoe kidney    BILATERAL  . Leukopenia   . Migraine    OCCULAR MIGRAINES - AURA WITH EYES  . Osteoporosis   . Pain    LUMBAR PAIN - RECENT FALL BECAUSE LEGS GAVE OUT - PT HAS HAD LUMBAR INJECTION SINCE THE FALL THAT HAS HELPED BACK PAIN  . Pain    CHRONIC RIGHT UPPER QUADRANT PAIN - RELATED TO GALLBLADDER PROBLEM  . Sciatica of right side   . Sinusitis    f/u with Dr. Janace Hoard.   . Vertigo     Past Surgical History:  Procedure Laterality Date  . ABDOMINAL HYSTERECTOMY  1983  . APPENDECTOMY  1968  . BILATERAL SALPINGOOPHORECTOMY  2000  . BREAST EXCISIONAL BIOPSY Right 1979  . CHOLECYSTECTOMY N/A 04/12/2014   Procedure: LAPAROSCOPIC CHOLECYSTECTOMY WITH INTRAOPERATIVE CHOLANGIOGRAM;  Surgeon: Kaylyn Lim, MD;  Location: [facility];  Service: General;  Laterality: N/A;  . Lumbar/cervical surgeries     CERVICAL FUSION C7-C6 WITH BONE GRAFT; C6-C5 WITH PLATE AND 4 SCREWS;  3 LUMBAR SURGERIES BUT NO FUSION  . NASAL SINUS SURGERY  2010  . right knee     ARTHROSCOPY  . TYMPANOSTOMY TUBE PLACEMENT  07/2017  . WISDOM TOOTH PULLED      Social History   Socioeconomic History  . Marital status: Married    Spouse name: Not on file  . Number of children: 2  . Years of education: Not on file  . Highest education level: Not on file  Occupational History    Comment: retired [employer]  Social Needs  . Financial  resource strain: Not on file  . Food insecurity:    Worry: Not on file    Inability: Not on file  . Transportation needs:    Medical: Not on file    Non-medical: Not on file  Tobacco Use  . Smoking status: Never Smoker  . Smokeless tobacco: Never Used  Substance and Sexual Activity  . Alcohol use: No    Comment: occassionally  . Drug use: No  . Sexual activity: Yes    Birth control/protection: None  Lifestyle  . Physical activity:    Days per week: Not on file    Minutes per session: Not on file  . Stress: Not on file  Relationships  . Social connections:    Talks on phone: Not on file    Gets together: Not on file    Attends religious service: Not on file    Active member of club or organization: Not on file    Attends meetings of clubs or organizations: Not on file    Relationship status: Not on file  . Intimate partner violence:    Fear of current or ex partner: Not on file    Emotionally abused: Not  on file    Physically abused: Not on file    Forced sexual activity: Not on file  Other Topics Concern  . Not on file  Social History Narrative  . Not on file    Current Outpatient Medications on File Prior to Visit  Medication Sig Dispense Refill  . albuterol (PROVENTIL HFA;VENTOLIN HFA) 108 (90 Base) MCG/ACT inhaler Inhale 1-2 puffs into the lungs every 4 (four) hours as needed for wheezing or shortness of breath (only short term treatment). 1 Inhaler 1  . ibandronate (BONIVA) 150 MG tablet TAKE 1 TABLET BY MOUTH  EVERY 30 DAYS 3 tablet 1  . loratadine (CLARITIN) 10 MG tablet Take 10 mg by mouth daily.    . meloxicam (MOBIC) 7.5 MG tablet Take 1-2 tablets daily for joint pains 180 tablet 3  . omeprazole (PRILOSEC) 20 MG capsule Take 1 capsule (20 mg total) by mouth 2 (two) times daily before a meal. 30 capsule 3  . SUMAtriptan (IMITREX) 100 MG tablet TAKE 1 2 (ONE HALF) TABLET BY MOUTH AS NEEDED FOR MIGRAINE  3  . triamcinolone (NASACORT ALLERGY 24HR) 55 MCG/ACT AERO  nasal inhaler Place 1 spray into the nose daily.    . TURMERIC PO Take 1 tablet by mouth daily at 8 pm.     . vitamin B-12 (CYANOCOBALAMIN) 1000 MCG tablet Take 1,000 mcg by mouth daily.    . Vitamin D, Cholecalciferol, 1000 units CAPS Take 1 capsule by mouth daily.     . vitamin E 400 UNIT capsule Take 400 Units by mouth daily. Patient takes 2 a day     No current facility-administered medications on file prior to visit.     Allergies  Allergen Reactions  . Peanut Oil Anaphylaxis  . Clarithromycin     REACTION: GI  . Codeine     REACTION: nausea and vomiting  . Lactose     Other reaction(s): GI Upset (intolerance)  . 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 Penicillins     REACTION: per allergy testing/Patient states she has taken Augmentin without complications.   . Pregabalin     REACTION: headache, several side effects  . Sulfa Antibiotics Other (See Comments)    REACTION: Hives, wheezing  . Sulfonamide Derivatives     REACTION: Hives, wheezing  . Cefdinir Rash    Family History  Problem Relation Age of Onset  . Cancer Father        Lung  . Neurodegenerative disease Mother   . Cancer Brother        prostate  . Cancer Maternal Aunt        breast cancer  . Breast cancer Maternal Aunt 66  . Cancer Maternal Grandfather        Leukemia  . Migraines Sister   . Breast cancer Sister 67  . Thyroid disease Daughter     BP 104/60 (BP Location: Left Arm, Patient Position: Sitting, Cuff Size: Normal)   Pulse 68   Ht 5\' 6"  (1.676 m)   Wt 136 lb 12.8 oz (62.1 kg)   SpO2 98%   BMI 22.08 kg/m    Review of Systems Denies ant. neck pain    Objective:   Physical Exam VITAL SIGNS:  See vs page GENERAL: no distress NECK: There is no palpable thyroid enlargement.  No thyroid nodule is palpable.  No palpable lymphadenopathy at the anterior neck.   Lab Results  Component Value Date  TSH 2.440 05/23/2018   T4TOTAL 7.7 01/14/2016        Assessment & Plan:  Thyroid nodule, stable on [place].  euthyroid  Patient Instructions  It is very unlikely that the thyroid is anything to worry about.  All you need is to have the blood test, and for Dr Raliegh Scarlet check your neck each year. Please come back for a follow-up appointment in 1 year.

## 2018-05-26 NOTE — Patient Instructions (Addendum)
It is very unlikely that the thyroid is anything to worry about.  All you need is to have the blood test, and for Dr Raliegh Scarlet check your neck each year. Please come back for a follow-up appointment in 1 year.

## 2018-05-31 ENCOUNTER — Encounter: Payer: Self-pay | Admitting: Family Medicine

## 2018-05-31 ENCOUNTER — Ambulatory Visit (INDEPENDENT_AMBULATORY_CARE_PROVIDER_SITE_OTHER): Payer: 59 | Admitting: Family Medicine

## 2018-05-31 VITALS — BP 105/63 | HR 66 | Temp 98.6°F | Ht 66.0 in | Wt 135.8 lb

## 2018-05-31 DIAGNOSIS — M810 Age-related osteoporosis without current pathological fracture: Secondary | ICD-10-CM

## 2018-05-31 DIAGNOSIS — F411 Generalized anxiety disorder: Secondary | ICD-10-CM | POA: Diagnosis not present

## 2018-05-31 DIAGNOSIS — K219 Gastro-esophageal reflux disease without esophagitis: Secondary | ICD-10-CM | POA: Diagnosis not present

## 2018-05-31 DIAGNOSIS — G8929 Other chronic pain: Secondary | ICD-10-CM

## 2018-05-31 DIAGNOSIS — M549 Dorsalgia, unspecified: Secondary | ICD-10-CM

## 2018-05-31 DIAGNOSIS — E041 Nontoxic single thyroid nodule: Secondary | ICD-10-CM | POA: Diagnosis not present

## 2018-05-31 DIAGNOSIS — F410 Panic disorder [episodic paroxysmal anxiety] without agoraphobia: Secondary | ICD-10-CM | POA: Insufficient documentation

## 2018-05-31 DIAGNOSIS — E7889 Other lipoprotein metabolism disorders: Secondary | ICD-10-CM | POA: Diagnosis not present

## 2018-05-31 DIAGNOSIS — F430 Acute stress reaction: Secondary | ICD-10-CM

## 2018-05-31 DIAGNOSIS — N200 Calculus of kidney: Secondary | ICD-10-CM | POA: Diagnosis not present

## 2018-05-31 MED ORDER — ESCITALOPRAM OXALATE 5 MG PO TABS: ORAL_TABLET | ORAL | 0 refills | Status: DC

## 2018-05-31 NOTE — Progress Notes (Signed)
Assessment and plan:  1. GAD   2. Panic attack as reaction to stress   3. Chronic back pain, unspecified back location, unspecified back pain laterality   4. Age related osteoporosis, unspecified pathological fracture presence   5. h/o Calcium kidney stones - unable to take supp Ca   6. Gastroesophageal reflux disease, esophagitis presence not specified   7. Right thyroid nodule   8. Elevated HDL     - Reviewed recent lab work (05/23/2018) in depth with patient today.  All lab work within normal limits unless otherwise noted.  - Cholesterol - Elevated HDL - Stable at this time. - Will continue to monitor.  HDL = 81. LDL = 84. Triglycerides = 35.  - Dietary changes such as low saturated & trans fat and low carb/ ketogenic diets discussed with patient.  Encouraged regular exercise and weight loss when appropriate.   - Osteoporosis - Bone Health - Advised patient to lift weights twice weekly and engage in weight-bearing exercise.  - Pt with history of calcium stones; unable to tale calcium supplement.  - Right Thyroid Nodule - In past, thyroid doctor said there is no need to follow up, no need for further workup, and she can continue to be monitored here at the clinic. - Will continue to monitor.  - GERD - No changes in treatment recommended today. - Prudent dietary/health habits reviewed.  Continue to avoid trigger foods as recommended. - Will continue to monitor.   - Stress Management - H/o GAD, Anxiety - Medication recommended and started today.  See med list today. - Reviewed the need to help the patient to relax so that she minimizes sx of panic and heart flutter.  - Infrequent Heart Flutters in Morning - Advised patient not to consume caffeine if she is experiencing occasional heart flutters.  Pt denies associated symptoms; denies chest pain or SOB.  Reviewed red flag symptoms with patient today and  need for monitoring in future if her symptoms continue or worsen.  Patient knows to seek care if she experiences red flag symptoms.  - Lifestyle & Preventative Health Maintenance - Advised patient to continue working toward exercising to improve overall mental, physical, and emotional health.    - American Heart Association guidelines for healthy diet, basically Mediterranean diet, and exercise guidelines of 30 minutes 5 days per week or more discussed in detail.  - Health counseling performed.  All questions answered.  - Encouraged patient to engage in daily physical activity, especially a formal exercise routine.  Recommended that the patient eventually strive for at least 150 minutes of moderate cardiovascular activity per week according to guidelines established by the [facility].   - Healthy dietary habits encouraged, including low-carb, and high amounts of lean protein in diet.   - Patient should also consume adequate amounts of water.   Education and routine counseling performed. Handouts provided.  - Follow-Up - Prescriptions refilled today. - Re-check fasting lab work as recommended. - Encouraged patient to follow up with her specialists as scheduled. - Otherwise, continue to return for CPE and chronic follow-up as scheduled.   - Patient knows to call in sooner if desired to address acute concerns.    Meds ordered this encounter  Medications  . escitalopram (LEXAPRO) 5 MG tablet    Sig: Take 0.5 tablets (2.5 mg total) by mouth at bedtime for 10 days, THEN 1 tablet (5 mg total) at bedtime.    Dispense:  90 tablet  Refill:  0    Return for f/up 4-75mo chronic conditions.   Anticipatory guidance and routine counseling done re: condition, txmnt options and need for follow up. All questions of patient's were answered.   Gross side effects, risk and benefits, and alternatives of medications discussed with patient.  Patient is aware that all medications have potential side effects  and we are unable to predict every sideeffect or drug-drug interaction that may occur.  Expresses verbal understanding and consents to current therapy plan and treatment regiment.  Please see AVS handed out to patient at the end of our visit for additional patient instructions/ counseling done pertaining to today's office visit.  Note:  This document was prepared using Dragon voice recognition software and may include unintentional dictation errors.   This document serves as a record of services personally performed by [name], DO. It was created on her behalf by Toni Amend, a trained medical scribe. The creation of this record is based on the scribe's personal observations and the provider's statements to them.   I have reviewed the above medical documentation for accuracy and completeness and I concur.  [name] 05/31/18 1:12 PM    -------------------------------------------------------------------------------------------------------------------    Subjective:   CC:   Leslie Dixon is a 72 y.o. female who [facility] to [facility] today for review and discussion of recent bloodwork that was done in addition to f/up on chronic conditions we are managing for pt.  1. All recent blood work that we ordered was reviewed with patient today.  Patient was counseled on all abnormalities and we discussed dietary and lifestyle changes that could help those values (also medications when appropriate).  Extensive health counseling performed and all patient's concerns/ questions were addressed.  See labs below and also plan for more details of these abnormalities  Patient is extremely energetic during appointment and speaking very quickly with rapid flight of thought.  Chronic Cough - Ongoing, Pt following up with ENT Has congestion and cough with "a couple of minor wheezes."  Notes that the cough is non-productive, "just a cough."  Feels it started  months ago, but has gotten worse lately.  However, also confirms it's similar to her usual cough.  Patient last visited ENT in August about her post-nasal drip.  States "I felt good then."  Notes she's never visited pulmonology.  Notes that her first husband was a chain smoker, about 43 years ago.  GI Follow-Up - Esophageal Dilation Saw Dr. Watt Climes last Wednesday (a week ago) and had esophageal dilation.  Patient was told she has inflammation in her stomach lining due to the meloxicam.  She was not given alternatives to deal with her arthritis pain at that time.  Patient was told by GI to return if she experiences issues with choking.  Says she's being very careful, taking smaller bites, chewing her food more.  She feels this is working out okay.  Notes she had a little breathing treatment with Dr. Watt Climes and will have to have her throat stretched again in future.  Infrequent Heart Flutters in Morning Notes she also has some "little heart flutters in the morning."  When the flutter occurs, patient denies chest pain, SOB, dizziness, visual changes.  Describes that she had a panic attack due to stress at one point in the past.  Notes that she went to the hospital for this incident at that time.  Stress Management at [place] she reads a lot and watches  Christmas shows.  Notes she uses a stationary bicycle and exercises for 10 minutes on the bike.  Patient has never taken anything for mood or panic.  Notes she took xanax for two weeks once.  Notes she experiences stress about [place] but "tries to ignore it."  Patient plans to start an exercise routine with her friend.  Notes "but I have to be careful because of my lumbar spine and because of this neck."   Wt Readings from Last 3 Encounters:  05/31/18 135 lb 12.8 oz (61.6 kg)  05/26/18 136 lb 12.8 oz (62.1 kg)  04/25/18 135 lb (61.2 kg)   BP Readings from Last 3 Encounters:  05/31/18 105/63  05/26/18 104/60  04/25/18 101/63   Pulse  Readings from Last 3 Encounters:  05/31/18 66  05/26/18 68  04/25/18 60   BMI Readings from Last 3 Encounters:  05/31/18 21.92 kg/m  05/26/18 22.08 kg/m  04/25/18 21.79 kg/m     Patient Care Team    Relationship Specialty Notifications [facility]  [name], DO PCP - General Family Medicine  02/20/16   Heath Lark, MD Consulting Physician Hematology and Oncology  01/02/15   Suella Broad, MD Consulting Physician Physical Medicine and Rehabilitation  04/20/16    Comment: lumbar injections  Myrlene Broker, MD Attending Physician Urology  04/20/16   Tyler Pita, MD Referring Physician Physical Medicine and Rehabilitation  04/20/16    Comment: Pain medicine  Katy Apo, MD Consulting Physician Ophthalmology  04/20/16    Comment: uveitis  Juanda Chance, NP Nurse Practitioner Obstetrics and Gynecology  04/20/16   Druscilla Brownie, MD Consulting Physician Dermatology  04/20/16    Comment: Lennie Odor, NP  Carloyn Manner, MD Referring Physician Otolaryngology  02/08/17    Comment: ENT Doc  Renato Shin, MD Consulting Physician Endocrinology  02/23/17   Katy Apo, MD Consulting Physician Ophthalmology  04/22/17    Comment: sees yrly for eye exam- every fall  Laurence Spates, MD Consulting Physician Gastroenterology  05/11/17     Full medical history updated and reviewed in the office today  Patient Active Problem List   Diagnosis Date Noted  . Family history of breast cancer- sister at age 67- new onset 02/23/2017    Priority: High  . Chronic vertigo 09/21/2016    Priority: High  . h/o GAD (had panic in past) 02/12/2016    Priority: High  . Fibromyalgia 03/07/2007    Priority: High  . GERD (gastroesophageal reflux disease)     Priority: Medium  . Osteoporosis     Priority: Medium  . Chronic back pain     Priority: Medium  . LACTOSE INTOLERANCE 03/07/2007    Priority: Medium  . OA (osteoarthritis) 03/07/2007    Priority: Medium  . History of thyroid  nodule- R side ( Dr Loanne Drilling eval in 10/17) 02/23/2017    Priority: Low  . ETD (Eustachian tube dysfunction), bilateral  ( R >er L ) 11/19/2016    Priority: Low  . 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 Insomnia 07/29/2016    Priority: Low  . History of non anemic vitamin B12 deficiency 05/02/2016    Priority: Low  . Environmental and seasonal allergies 04/20/2016    Priority: Low  . Idiopathic benign Leukopenia     Priority: Low  . Ocular migraine 03/07/2007    Priority: Low  . Elevated HDL 05/31/2018  . Panic attack as reaction to  stress 05/31/2018  . Lumbar post-laminectomy syndrome 01/24/2018  . Gastroesophageal reflux disease 12/28/2017  . Chronic neck pain 12/28/2017  . Osteoarthritis 12/28/2017  . Cough 11/21/2017  . SOB (shortness of breath) 11/21/2017  . Cervical radiculopathy 10/24/2017  . Chronic pain 10/19/2017  . DDD (degenerative disc disease), cervical 10/19/2017  . Irritable bowel syndrome with diarrhea 09/29/2017  . Migraine without status migrainosus, not intractable 09/29/2017  . Maxillary sinusitis 05/10/2017  . Postnasal discharge 05/10/2017  . BPPV (benign paroxysmal positional vertigo), right 05/10/2017  . Status post lumbar surgery- foot drop R foot 02/23/2017  . 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 Presbycusis of both ears 09/30/2016  . Right thyroid nodule 05/28/2016  . Intolerance to cold 05/02/2016  . Encounter for wellness examination 04/20/2016  . Can't get food down 04/10/2012  . Difficulty speaking 04/10/2012  . Hydronephrosis 03/22/2012  . Back pain   . Chronic infection of sinus 07/09/2011  . Blood in the urine 05/06/2011  . 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 Calcium kidney stones 05/06/2011  . HYPOKALEMIA 01/03/2008  . Allergic rhinitis due to pollen 12/13/2007  . 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 CARPAL TUNNEL SYNDROME 03/07/2007  . VARICOSE VEIN 03/07/2007  . NEPHROLITHIASIS, HX OF  03/07/2007    Past Medical History:  Diagnosis Date  . Blood dyscrasia    LEUKOPENIA- DR. CHISM - NO TREATMENT BUT IS MONITORED  . Chronic back pain   . Chronic back pain   . Fibromyalgia   . Foot drop, right    RELATED TO LUMBAR PROBLEMS  . GERD (gastroesophageal reflux disease)    PRILOSEC PRN  . History of kidney stones   . Horseshoe kidney    BILATERAL  . Leukopenia   . Migraine    OCCULAR MIGRAINES - AURA WITH EYES  . Osteoporosis   . Pain    LUMBAR PAIN - RECENT FALL BECAUSE LEGS GAVE OUT - PT HAS HAD LUMBAR INJECTION SINCE THE FALL THAT HAS HELPED BACK PAIN  . Pain    CHRONIC RIGHT UPPER QUADRANT PAIN - RELATED TO GALLBLADDER PROBLEM  . Sciatica of right side   . Sinusitis    f/u with Dr. Janace Hoard.   . Vertigo     Past Surgical History:  Procedure Laterality Date  . ABDOMINAL HYSTERECTOMY  1983  . APPENDECTOMY  1968  . BILATERAL SALPINGOOPHORECTOMY  2000  . BREAST EXCISIONAL BIOPSY Right 1979  . CHOLECYSTECTOMY N/A 04/12/2014   Procedure: LAPAROSCOPIC CHOLECYSTECTOMY WITH INTRAOPERATIVE CHOLANGIOGRAM;  Surgeon: Kaylyn Lim, MD;  Location: [facility];  Service: General;  Laterality: N/A;  . Lumbar/cervical surgeries     CERVICAL FUSION C7-C6 WITH BONE GRAFT; C6-C5 WITH PLATE AND 4 SCREWS;  3 LUMBAR SURGERIES BUT NO FUSION  . NASAL SINUS SURGERY  2010  . right knee     ARTHROSCOPY  . TYMPANOSTOMY TUBE PLACEMENT  07/2017  . WISDOM TOOTH PULLED      Social History   Tobacco Use  . Smoking status: Never Smoker  . Smokeless tobacco: Never Used  Substance Use Topics  . Alcohol use: No    Comment: occassionally    Family Hx: Family History  Problem Relation Age of Onset  . Cancer Father        Lung  . Neurodegenerative disease Mother   . Cancer Brother        prostate  . Cancer Maternal Aunt        breast cancer  . Breast cancer Maternal Aunt  66  . Cancer Maternal Grandfather        Leukemia  . Migraines Sister   . Breast cancer Sister 76  . Thyroid  disease Daughter      Medications: Current Outpatient Medications  Medication Sig Dispense Refill  . albuterol (PROVENTIL HFA;VENTOLIN HFA) 108 (90 Base) MCG/ACT inhaler Inhale 1-2 puffs into the lungs every 4 (four) hours as needed for wheezing or shortness of breath (only short term treatment). 1 Inhaler 1  . ibandronate (BONIVA) 150 MG tablet TAKE 1 TABLET BY MOUTH  EVERY 30 DAYS 3 tablet 1  . loratadine (CLARITIN) 10 MG tablet Take 10 mg by mouth daily.    . meloxicam (MOBIC) 7.5 MG tablet Take 1-2 tablets daily for joint pains 180 tablet 3  . omeprazole (PRILOSEC) 20 MG capsule Take 1 capsule (20 mg total) by mouth 2 (two) times daily before a meal. 30 capsule 3  . SUMAtriptan (IMITREX) 100 MG tablet TAKE 1 2 (ONE HALF) TABLET BY MOUTH AS NEEDED FOR MIGRAINE  3  . triamcinolone (NASACORT ALLERGY 24HR) 55 MCG/ACT AERO nasal inhaler Place 1 spray into the nose daily.    . TURMERIC PO Take 1 tablet by mouth daily at 8 pm.     . vitamin B-12 (CYANOCOBALAMIN) 1000 MCG tablet Take 1,000 mcg by mouth daily.    . Vitamin D, Cholecalciferol, 1000 units CAPS Take 1 capsule by mouth daily.     . vitamin E 400 UNIT capsule Take 400 Units by mouth daily. Patient takes 2 a day    . escitalopram (LEXAPRO) 5 MG tablet Take 0.5 tablets (2.5 mg total) by mouth at bedtime for 10 days, THEN 1 tablet (5 mg total) at bedtime. 90 tablet 0   No current facility-administered medications for this visit.     Allergies:  Allergies  Allergen Reactions  . Peanut Oil Anaphylaxis  . Clarithromycin     REACTION: GI  . Codeine     REACTION: nausea and vomiting  . Lactose     Other reaction(s): GI Upset (intolerance)  . 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 Penicillins     REACTION: per allergy testing/Patient states she has taken Augmentin without complications.   . Pregabalin     REACTION: headache, several side effects  . Sulfa Antibiotics Other (See Comments)     REACTION: Hives, wheezing  . Sulfonamide Derivatives     REACTION: Hives, wheezing  . Cefdinir Rash    Review of Systems: General:   No F/C, wt loss Pulm:   No DIB, SOB, pleuritic chest pain Card:  No CP, palpitations Abd:  No n/v/d or pain Ext:  No inc edema from baseline  Objective:  Blood pressure 105/63, pulse 66, temperature 98.6 F (37 C), height 5\' 6"  (1.676 m), weight 135 lb 12.8 oz (61.6 kg), SpO2 100 %. Body mass index is 21.92 kg/m. Gen:   Well NAD, A and O *3 HEENT:    [place]/AT, EOMI,  MMM Lungs:   Normal work of breathing. CTA B/L, no Wh, rhonchi Heart:   RRR, S1, S2 WNL's, no MRG Abd:   No gross distention Exts:    warm, pink,  Brisk capillary refill, warm and well perfused.  Psych:    No HI/SI, judgement and insight good, Euthymic mood. Full Affect.   Recent Results (from the past 2160 hour(s))  Phosphorus     Status: None   Collection Time: 05/23/18  8:35 AM  Result Value Ref Range   Phosphorus 3.4 2.5 - 4.5 mg/dL  Magnesium     Status: None   Collection Time: 05/23/18  8:35 AM  Result Value Ref Range   Magnesium 2.3 1.6 - 2.3 mg/dL  T3, free     Status: None   Collection Time: 05/23/18  8:35 AM  Result Value Ref Range   T3, Free 2.6 2.0 - 4.4 pg/mL  VITAMIN D 25 Hydroxy (Vit-D Deficiency, Fractures)     Status: None   Collection Time: 05/23/18  8:35 AM  Result Value Ref Range   Vit D, 25-Hydroxy 46.7 30.0 - 100.0 ng/mL    Comment: Vitamin D deficiency has been defined by the [place] practice guideline as a level of serum 25-OH vitamin D less than 20 ng/mL (1,2). The Endocrine Society went on to further define vitamin D insufficiency as a level between 21 and 29 ng/mL (2). 1. IOM (Institute of Medicine). 2010. Dietary reference    intakes for calcium and D. [place]: The    [employer]. 2. Holick MF, Binkley [place], Bischoff-Ferrari HA, et al.    Evaluation, treatment, and prevention of vitamin D     deficiency: an Endocrine Society clinical practice    guideline. JCEM. 2011 Jul; 96(7):1911-30.   Vitamin B12     Status: None   Collection Time: 05/23/18  8:35 AM  Result Value Ref Range   Vitamin B-12 737 232 - 1,245 pg/mL  TSH     Status: None   Collection Time: 05/23/18  8:35 AM  Result Value Ref Range   TSH 2.440 0.450 - 4.500 uIU/mL  T4, free     Status: None   Collection Time: 05/23/18  8:35 AM  Result Value Ref Range   Free T4 1.18 0.82 - 1.77 ng/dL  Lipid panel     Status: None   Collection Time: 05/23/18  8:35 AM  Result Value Ref Range   Cholesterol, Total 172 100 - 199 mg/dL   Triglycerides 35 0 - 149 mg/dL   HDL 81 >39 mg/dL   VLDL Cholesterol Cal 7 5 - 40 mg/dL   LDL Calculated 84 0 - 99 mg/dL   Chol/HDL Ratio 2.1 0.0 - 4.4 ratio    Comment:                                   T. Chol/HDL Ratio                                             Men  Women                               1/2 Avg.Risk  3.4    3.3                                   Avg.Risk  5.0    4.4                                2X Avg.Risk  9.6    7.1  3X Avg.Risk 23.4   11.0   Hemoglobin A1c     Status: None   Collection Time: 05/23/18  8:35 AM  Result Value Ref Range   Hgb A1c MFr Bld 5.4 4.8 - 5.6 %    Comment:          Prediabetes: 5.7 - 6.4          Diabetes: >6.4          Glycemic control for adults with diabetes: <7.0    Est. average glucose Bld gHb Est-mCnc 108 mg/dL  Comprehensive metabolic panel     Status: Abnormal   Collection Time: 05/23/18  8:35 AM  Result Value Ref Range   Glucose 83 65 - 99 mg/dL   BUN 20 8 - 27 mg/dL   Creatinine, Ser 0.84 0.57 - 1.00 mg/dL   GFR calc non Af Amer 69 >59 mL/min/1.73   GFR calc Af Amer 79 >59 mL/min/1.73   BUN/Creatinine Ratio 24 12 - 28   Sodium 142 134 - 144 mmol/L   Potassium 4.2 3.5 - 5.2 mmol/L   Chloride 108 (H) 96 - 106 mmol/L   CO2 22 20 - 29 mmol/L   Calcium 9.0 8.7 - 10.3 mg/dL   Total Protein 6.1 6.0 -  8.5 g/dL   Albumin 4.1 3.5 - 4.8 g/dL   Globulin, Total 2.0 1.5 - 4.5 g/dL   Albumin/Globulin Ratio 2.1 1.2 - 2.2   Bilirubin Total 0.4 0.0 - 1.2 mg/dL   Alkaline Phosphatase 58 39 - 117 IU/L   AST 17 0 - 40 IU/L   ALT 12 0 - 32 IU/L  CBC with Differential/Platelet     Status: Abnormal   Collection Time: 05/23/18  8:35 AM  Result Value Ref Range   WBC 3.1 (L) 3.4 - 10.8 x10E3/uL   RBC 4.72 3.77 - 5.28 x10E6/uL   Hemoglobin 14.0 11.1 - 15.9 g/dL   Hematocrit 41.3 34.0 - 46.6 %   MCV 88 79 - 97 fL   MCH 29.7 26.6 - 33.0 pg   MCHC 33.9 31.5 - 35.7 g/dL   RDW 15.2 12.3 - 15.4 %   Platelets 191 150 - 450 x10E3/uL   Neutrophils 47 Not Estab. %   Lymphs 43 Not Estab. %   Monocytes 8 Not Estab. %   Eos 2 Not Estab. %   Basos 0 Not Estab. %   Neutrophils Absolute 1.4 1.4 - 7.0 x10E3/uL   Lymphocytes Absolute 1.3 0.7 - 3.1 x10E3/uL   Monocytes Absolute 0.2 0.1 - 0.9 x10E3/uL   EOS (ABSOLUTE) 0.1 0.0 - 0.4 x10E3/uL   Basophils Absolute 0.0 0.0 - 0.2 x10E3/uL   Immature Granulocytes 0 Not Estab. %   Immature Grans (Abs) 0.0 0.0 - 0.1 x10E3/uL

## 2018-05-31 NOTE — Patient Instructions (Signed)

## 2018-06-14 DIAGNOSIS — H43812 Vitreous degeneration, left eye: Secondary | ICD-10-CM | POA: Diagnosis not present

## 2018-06-14 DIAGNOSIS — H52203 Unspecified astigmatism, bilateral: Secondary | ICD-10-CM | POA: Diagnosis not present

## 2018-06-14 DIAGNOSIS — H2511 Age-related nuclear cataract, right eye: Secondary | ICD-10-CM | POA: Diagnosis not present

## 2018-06-14 DIAGNOSIS — Z961 Presence of intraocular lens: Secondary | ICD-10-CM | POA: Diagnosis not present

## 2018-06-28 DIAGNOSIS — M5412 Radiculopathy, cervical region: Secondary | ICD-10-CM | POA: Diagnosis not present

## 2018-06-28 DIAGNOSIS — M5136 Other intervertebral disc degeneration, lumbar region: Secondary | ICD-10-CM | POA: Diagnosis not present

## 2018-06-28 DIAGNOSIS — M5030 Other cervical disc degeneration, unspecified cervical region: Secondary | ICD-10-CM | POA: Diagnosis not present

## 2018-07-17 ENCOUNTER — Ambulatory Visit: Payer: 59 | Admitting: Family Medicine

## 2018-07-17 ENCOUNTER — Ambulatory Visit
Admission: EM | Admit: 2018-07-17 | Discharge: 2018-07-17 | Disposition: A | Discharge status: Home / Self Care | Payer: 59 | Attending: Family Medicine | Admitting: Family Medicine

## 2018-07-17 ENCOUNTER — Encounter: Payer: Self-pay | Admitting: Emergency Medicine

## 2018-07-17 DIAGNOSIS — R0602 Shortness of breath: Secondary | ICD-10-CM

## 2018-07-17 DIAGNOSIS — J410 Simple chronic bronchitis: Secondary | ICD-10-CM | POA: Diagnosis not present

## 2018-07-17 DIAGNOSIS — R059 Cough, unspecified: Secondary | ICD-10-CM

## 2018-07-17 DIAGNOSIS — R05 Cough: Secondary | ICD-10-CM | POA: Insufficient documentation

## 2018-07-17 DIAGNOSIS — R062 Wheezing: Secondary | ICD-10-CM

## 2018-07-17 MED ORDER — ALBUTEROL SULFATE HFA 108 (90 BASE) MCG/ACT IN AERS: 1.0000 | INHALATION_SPRAY | RESPIRATORY_TRACT | 1 refills | Status: DC | PRN

## 2018-07-17 MED ORDER — BENZONATATE 200 MG PO CAPS: 200.0000 mg | ORAL_CAPSULE | Freq: Two times a day (BID) | ORAL | 0 refills | Status: DC | PRN

## 2018-07-17 MED ORDER — PREDNISONE 20 MG PO TABS: 20.0000 mg | ORAL_TABLET | Freq: Two times a day (BID) | ORAL | 0 refills | Status: DC

## 2018-07-17 MED ORDER — METHYLPREDNISOLONE SODIUM SUCC 125 MG IJ SOLR
80.0000 mg | Freq: Once | INTRAMUSCULAR | Status: AC
  Administered 2018-07-17: 80 mg via INTRAMUSCULAR

## 2018-07-17 MED ORDER — DOXYCYCLINE HYCLATE 100 MG PO CAPS: 100.0000 mg | ORAL_CAPSULE | Freq: Two times a day (BID) | ORAL | 0 refills | Status: DC

## 2018-07-17 NOTE — ED Triage Notes (Signed)
Pt presents to [facility] for assessment of cough, congestion.  States hx of bronchitis, was "blown off" by her PCP.  States she has gone hoarse over the weekend from the cough, has used all of her inhaler and all of the remaining Tessalon pearls she had available at home without relief.  Denies fevers.

## 2018-07-17 NOTE — ED Provider Notes (Signed)
[facility]    CSN: [id number] Arrival date & time: 07/17/18  1000     History   Chief Complaint Chief Complaint  Patient presents with  . Cough    HPI Leslie Dixon is a 72 y.o. female.   HPI  Patient is here for cough.  States that she started having difficulty in April of this year.  She states she put down pine straw without a mask and became short of breath.  She went in for medical care and was treated with antibiotics, prednisone, Tessalon, and an albuterol inhaler.  Chest x-ray at that time showed some hyperinflation.  She is never been a smoker.  She has a history of allergies but states she does not have asthma. She states that the cough got better somewhat but then she has persisted in needing an inhaler and having shortness of breath and coughing.  She told this to her primary care doctor during her visit in November.  She states that the primary care doctor did not acknowledge this complaint or provide prescriptions were advised.  As I reviewed that note, it appears that was a physical exam and multiple issues were reviewed.  I explained to the patient that with her persistent symptoms, follow-up with her primary care doctor is going to be important. She has no fever.  She sometimes has chills and sweats at night.  She is not coughing up any sputum.  She has shortness of breath with any activity.  No chest pain.  She thinks she is had her pneumonia shots. Medical history is reviewed. Multiple allergies are reviewed. Her vital signs are stable.  Past Medical History:  Diagnosis Date  . Blood dyscrasia    LEUKOPENIA- DR. CHISM - NO TREATMENT BUT IS MONITORED  . Chronic back pain   . Chronic back pain   . Fibromyalgia   . Foot drop, right    RELATED TO LUMBAR PROBLEMS  . GERD (gastroesophageal reflux disease)    PRILOSEC PRN  . History of kidney stones   . Horseshoe kidney    BILATERAL  . Leukopenia   . Migraine    OCCULAR MIGRAINES - [facility] WITH EYES   . Osteoporosis   . Pain    LUMBAR PAIN - RECENT FALL BECAUSE LEGS GAVE OUT - PT HAS HAD LUMBAR INJECTION SINCE THE FALL THAT HAS HELPED BACK PAIN  . Pain    CHRONIC RIGHT UPPER QUADRANT PAIN - RELATED TO GALLBLADDER PROBLEM  . Sciatica of right side   . Sinusitis    f/u with Dr. Janace Hoard.   . Vertigo     Patient Active Problem List   Diagnosis Date Noted  . Elevated HDL 05/31/2018  . Panic attack as reaction to stress 05/31/2018  . Lumbar post-laminectomy syndrome 01/24/2018  . Gastroesophageal reflux disease 12/28/2017  . Chronic neck pain 12/28/2017  . Osteoarthritis 12/28/2017  . Cough 11/21/2017  . SOB (shortness of breath) 11/21/2017  . Cervical radiculopathy 10/24/2017  . Chronic pain 10/19/2017  . DDD (degenerative disc disease), cervical 10/19/2017  . Irritable bowel syndrome with diarrhea 09/29/2017  . Migraine without status migrainosus, not intractable 09/29/2017  . Maxillary sinusitis 05/10/2017  . Postnasal discharge 05/10/2017  . BPPV (benign paroxysmal positional vertigo), right 05/10/2017  . Family history of breast cancer- sister at age 50- new onset 02/23/2017  . History of thyroid nodule- R side ( Dr Loanne Drilling eval in 10/17) 02/23/2017  . Status post lumbar surgery- foot drop R foot 02/23/2017  .  ETD (Eustachian tube dysfunction), bilateral  ( R >er L ) 11/19/2016  . Chronic middle ear effusion, bilateral ( R >er L ) 11/19/2016  . 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 Presbycusis of both ears 09/30/2016  . Chronic vertigo 09/21/2016  . Insomnia 07/29/2016  . Right thyroid nodule 05/28/2016  . Intolerance to cold 05/02/2016  . History of non anemic vitamin B12 deficiency 05/02/2016  . Environmental and seasonal allergies 04/20/2016  . Encounter for wellness examination 04/20/2016  . h/o GAD (had panic in past) 02/12/2016  . GERD (gastroesophageal reflux disease)   . Osteoporosis   . Chronic back pain   . Can't get food down 04/10/2012  .  Difficulty speaking 04/10/2012  . Hydronephrosis 03/22/2012  . Idiopathic benign Leukopenia   . Back pain   . Chronic infection of sinus 07/09/2011  . Blood in the urine 05/06/2011  . 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 Calcium kidney stones 05/06/2011  . HYPOKALEMIA 01/03/2008  . Allergic rhinitis due to pollen 12/13/2007  . 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 LACTOSE INTOLERANCE 03/07/2007  . Ocular migraine 03/07/2007  . CARPAL TUNNEL SYNDROME 03/07/2007  . VARICOSE VEIN 03/07/2007  . OA (osteoarthritis) 03/07/2007  . Fibromyalgia 03/07/2007  . NEPHROLITHIASIS, HX OF 03/07/2007    Past Surgical History:  Procedure Laterality Date  . ABDOMINAL HYSTERECTOMY  1983  . APPENDECTOMY  1968  . BILATERAL SALPINGOOPHORECTOMY  2000  . BREAST EXCISIONAL BIOPSY Right 1979  . CHOLECYSTECTOMY N/A 04/12/2014   Procedure: LAPAROSCOPIC CHOLECYSTECTOMY WITH INTRAOPERATIVE CHOLANGIOGRAM;  Surgeon: Kaylyn Lim, MD;  Location: [facility];  Service: General;  Laterality: N/A;  . Lumbar/cervical surgeries     CERVICAL FUSION C7-C6 WITH BONE GRAFT; C6-C5 WITH PLATE AND 4 SCREWS;  3 LUMBAR SURGERIES BUT NO FUSION  . NASAL SINUS SURGERY  2010  . right knee     ARTHROSCOPY  . TYMPANOSTOMY TUBE PLACEMENT  07/2017  . WISDOM TOOTH PULLED      OB History    Gravida  2   Para  2   Term  2   Preterm      AB      Living        SAB      TAB      Ectopic      Multiple      Live Births               Home Medications    Prior to Admission medications   Medication Sig Start Date End Date Taking? Authorizing Provider  ibandronate (BONIVA) 150 MG tablet TAKE 1 TABLET BY MOUTH  EVERY 30 DAYS 04/25/18  Yes Opalski, Neoma Laming, DO  loratadine (CLARITIN) 10 MG tablet Take 10 mg by mouth daily.   Yes [provider]  meloxicam (MOBIC) 7.5 MG tablet Take 1-2 tablets daily for joint pains 12/28/17  Yes Opalski, Neoma Laming, DO  omeprazole (PRILOSEC) 20 MG capsule Take 1 capsule (20 mg total) by mouth  2 (two) times daily before a meal. 09/29/17  Yes Opalski, Deborah, DO  SUMAtriptan (IMITREX) 100 MG tablet TAKE 1 2 (ONE HALF) TABLET BY MOUTH AS NEEDED FOR MIGRAINE 02/24/18  Yes [provider]  triamcinolone (NASACORT ALLERGY 24HR) 55 MCG/ACT AERO nasal inhaler Place 1 spray into the nose daily.   Yes [provider]  albuterol (PROVENTIL HFA;VENTOLIN HFA) 108 (90 Base) MCG/ACT inhaler Inhale 1-2 puffs into the lungs every 4 (four) hours as needed for wheezing or shortness  of breath (only short term treatment). 07/17/18   Raylene Everts, MD  benzonatate (TESSALON) 200 MG capsule Take 1 capsule (200 mg total) by mouth 2 (two) times daily as needed for cough. 07/17/18   Raylene Everts, MD  doxycycline (VIBRAMYCIN) 100 MG capsule Take 1 capsule (100 mg total) by mouth 2 (two) times daily. 07/17/18   Raylene Everts, MD  predniSONE (DELTASONE) 20 MG tablet Take 1 tablet (20 mg total) by mouth 2 (two) times daily with a meal. 07/17/18   Raylene Everts, MD  TURMERIC PO Take 1 tablet by mouth daily at 8 pm.     [provider]  vitamin B-12 (CYANOCOBALAMIN) 1000 MCG tablet Take 1,000 mcg by mouth daily.    [provider]  Vitamin D, Cholecalciferol, 1000 units CAPS Take 1 capsule by mouth daily.     [provider]  vitamin E 400 UNIT capsule Take 400 Units by mouth daily. Patient takes 2 a day    [provider]    Family History Family History  Problem Relation Age of Onset  . Cancer Father        Lung  . Neurodegenerative disease Mother   . Cancer Brother        prostate  . Cancer Maternal Aunt        breast cancer  . Breast cancer Maternal Aunt 66  . Cancer Maternal Grandfather        Leukemia  . Migraines Sister   . Breast cancer Sister 27  . Thyroid disease Daughter     Social History Social History   Tobacco Use  . Smoking status: Never Smoker  . Smokeless tobacco: Never Used  Substance Use Topics  . Alcohol  use: No    Comment: occassionally  . Drug use: No     Allergies   Peanut oil; Clarithromycin; Codeine; Lactose; Other; Penicillins; Pregabalin; Sulfa antibiotics; Sulfonamide derivatives; and Cefdinir   Review of Systems Review of Systems  Constitutional: Positive for chills and fatigue. Negative for fever.  HENT: Negative for ear pain and sore throat.   Eyes: Negative for pain and visual disturbance.  Respiratory: Positive for cough, shortness of breath and wheezing.   Cardiovascular: Negative for chest pain and palpitations.  Gastrointestinal: Negative for abdominal pain and vomiting.  Genitourinary: Negative for dysuria and hematuria.  Musculoskeletal: Negative for arthralgias and back pain.  Skin: Negative for color change and rash.  Neurological: Negative for seizures and syncope.  Psychiatric/Behavioral: Positive for sleep disturbance. The patient is not nervous/anxious.        Patient states that her primary care doctor thinks she has anxiety, but she is "just high strung"  All other systems reviewed and are negative.    Physical Exam Triage Vital Signs ED Triage Vitals  Enc Vitals Group     BP 07/17/18 1009 122/81     Pulse Rate 07/17/18 1009 85     Resp 07/17/18 1009 18     Temp 07/17/18 1009 98 F (36.7 C)     Temp Source 07/17/18 1009 Oral     SpO2 07/17/18 1009 95 %   No data found.  Updated Vital Signs BP 122/81 (BP Location: Left Arm)   Pulse 85   Temp 98 F (36.7 C) (Oral)   Resp 18   SpO2 95%      Physical Exam Constitutional:      General: She is not in acute distress.    Appearance: Normal appearance.  She is well-developed.     Comments: Appears tired  HENT:     Head: Normocephalic and atraumatic.     Right Ear: Tympanic membrane and ear canal normal.     Left Ear: Tympanic membrane and ear canal normal.     Nose: Nose normal. No congestion.     Mouth/Throat:     Mouth: Mucous membranes are moist.  Eyes:     Conjunctiva/sclera:  Conjunctivae normal.     Pupils: Pupils are equal, round, and reactive to light.  Neck:     Musculoskeletal: Normal range of motion.  Cardiovascular:     Rate and Rhythm: Normal rate and regular rhythm.     Heart sounds: Normal heart sounds.  Pulmonary:     Effort: Pulmonary effort is normal. No respiratory distress.     Comments: Patient has end inspiratory wheeze rarely, with cough she has pronounced wheezing with exhalation. Abdominal:     General: Bowel sounds are normal. There is no distension.     Palpations: Abdomen is soft.  Musculoskeletal: Normal range of motion.  Skin:    General: Skin is warm and dry.  Neurological:     General: No focal deficit present.     Mental Status: She is alert. Mental status is at baseline.  Psychiatric:        Mood and Affect: Mood normal.        Thought Content: Thought content normal.      UC Treatments / Results  Labs (all labs ordered are listed, but only abnormal results are displayed) Labs Reviewed - No data to display  EKG None  Radiology No results found.  Procedures Procedures (including critical care time)  Medications Ordered in UC Medications  methylPREDNISolone sodium succinate (SOLU-MEDROL) 125 mg/2 mL injection 80 mg (80 mg Intramuscular Given 07/17/18 1036)    Initial Impression / Assessment and Plan / UC Course  I have reviewed the triage vital signs and the nursing notes.  Pertinent labs & imaging results that were available during my care of the patient were reviewed by me and considered in my medical decision making (see chart for details).    Based on her chest x-ray showing hyperinflation and possible chronic bronchitis, and the duration of her shortness of breath and coughing (off and on since April) I believe she probably has some chronic lung disease.  She needs to follow-up with her primary care doctor.  She may benefit from pulmonary function testing.  Discussed with the patient that believe she needs  prednisone, inhalers, and possibly an inhaled steroid.  She is insistent she needs an antibiotic and this is provided just because of the longevity of her symptoms. Final Clinical Impressions(s) / UC Diagnoses   Final diagnoses:  Simple chronic bronchitis ([place])     Discharge Instructions     Take the antibiotic 2 x a day Take the prednisone 2 x a day Use the inhaler as needed Use the tessalon 2-3 times a day Follow up with a PCP   ED Prescriptions    Medication Sig Dispense Auth. Provider   albuterol (PROVENTIL HFA;VENTOLIN HFA) 108 (90 Base) MCG/ACT inhaler Inhale 1-2 puffs into the lungs every 4 (four) hours as needed for wheezing or shortness of breath (only short term treatment). 1 Inhaler Raylene Everts, MD   benzonatate (TESSALON) 200 MG capsule Take 1 capsule (200 mg total) by mouth 2 (two) times daily as needed for cough. 20 capsule Raylene Everts, MD   doxycycline (  VIBRAMYCIN) 100 MG capsule Take 1 capsule (100 mg total) by mouth 2 (two) times daily. 20 capsule Raylene Everts, MD   predniSONE (DELTASONE) 20 MG tablet Take 1 tablet (20 mg total) by mouth 2 (two) times daily with a meal. 10 tablet Raylene Everts, MD     Controlled Substance Prescriptions [place] Controlled Substance Registry consulted? Not Applicable   Raylene Everts, MD 07/17/18 1054

## 2018-07-17 NOTE — ED Notes (Signed)
Patient able to ambulate independently  

## 2018-07-17 NOTE — Discharge Instructions (Addendum)
Take the antibiotic 2 x a day Take the prednisone 2 x a day Use the inhaler as needed Use the tessalon 2-3 times a day Follow up with a PCP

## 2018-07-27 ENCOUNTER — Ambulatory Visit (INDEPENDENT_AMBULATORY_CARE_PROVIDER_SITE_OTHER): Payer: Medicare Other | Admitting: Family Medicine

## 2018-07-27 ENCOUNTER — Telehealth: Payer: Self-pay | Admitting: Family Medicine

## 2018-07-27 ENCOUNTER — Ambulatory Visit (INDEPENDENT_AMBULATORY_CARE_PROVIDER_SITE_OTHER): Payer: Medicare Other

## 2018-07-27 ENCOUNTER — Encounter: Payer: Self-pay | Admitting: Family Medicine

## 2018-07-27 VITALS — BP 136/72 | HR 85 | Temp 97.7°F | Resp 18 | Ht 66.0 in | Wt 137.2 lb

## 2018-07-27 DIAGNOSIS — R0989 Other specified symptoms and signs involving the circulatory and respiratory systems: Secondary | ICD-10-CM | POA: Diagnosis not present

## 2018-07-27 DIAGNOSIS — R0981 Nasal congestion: Secondary | ICD-10-CM

## 2018-07-27 DIAGNOSIS — H1031 Unspecified acute conjunctivitis, right eye: Secondary | ICD-10-CM | POA: Diagnosis not present

## 2018-07-27 DIAGNOSIS — R0789 Other chest pain: Secondary | ICD-10-CM

## 2018-07-27 DIAGNOSIS — J4541 Moderate persistent asthma with (acute) exacerbation: Secondary | ICD-10-CM

## 2018-07-27 DIAGNOSIS — J42 Unspecified chronic bronchitis: Secondary | ICD-10-CM

## 2018-07-27 DIAGNOSIS — R05 Cough: Secondary | ICD-10-CM

## 2018-07-27 MED ORDER — ERYTHROMYCIN 5 MG/GM OP OINT: 1.0000 "application " | TOPICAL_OINTMENT | Freq: Every day | OPHTHALMIC | 0 refills | Status: DC

## 2018-07-27 MED ORDER — FLUTICASONE PROPIONATE HFA 44 MCG/ACT IN AERO: 2.0000 | INHALATION_SPRAY | Freq: Two times a day (BID) | RESPIRATORY_TRACT | 12 refills | Status: DC

## 2018-07-27 MED ORDER — CETIRIZINE HCL 10 MG PO TABS: 10.0000 mg | ORAL_TABLET | Freq: Every day | ORAL | 11 refills | Status: DC

## 2018-07-27 MED ORDER — PROMETHAZINE HCL 6.25 MG/5ML PO SYRP: 6.2500 mg | ORAL_SOLUTION | Freq: Every evening | ORAL | 0 refills | Status: DC | PRN

## 2018-07-27 MED ORDER — MONTELUKAST SODIUM 10 MG PO TABS: 10.0000 mg | ORAL_TABLET | Freq: Every day | ORAL | 3 refills | Status: DC

## 2018-07-27 NOTE — Telephone Encounter (Signed)
Patient notified of xray results & recommendations. Expressed understanding. 

## 2018-07-27 NOTE — Patient Instructions (Signed)
Thank you for choosing Primary Care at [facility] for your medical home!    Leslie Dixon was seen by Molli Barrows, FNP today.   Leslie Dixon's primary care doctor is Scot Jun, FNP.   For the best care possible,  you should try to see Molli Barrows, FNP-C  whenever you come to clinic.   We look forward to seeing you again soon!  If you have any questions about your visit today,  please call us at [phone]  Or feel free to reach your provider via [facility].      Asthma Attack Prevention, Adult Although you may not be able to control the fact that you have asthma, you can take actions to prevent episodes of asthma (asthma attacks). These actions include:  Creating a written plan for managing and treating your asthma attacks (asthma action plan).  Monitoring your asthma.  Avoiding things that can irritate your airways or make your asthma symptoms worse (asthma triggers).  Taking your medicines as directed.  Acting quickly if you have signs or symptoms of an asthma attack. What are some ways to prevent an asthma attack? Create a plan Work with your health care provider to create an asthma action plan. This plan should include:  A list of your asthma triggers and how to avoid them.  A list of symptoms that you experience during an asthma attack.  Information about when to take medicine and how much medicine to take.  Information to help you understand your peak flow measurements.  Contact information for your health care providers.  Daily actions that you can take to control asthma. Monitor your asthma To monitor your asthma:  Use your peak flow meter every morning and every evening for 2-3 weeks. Record the results in a journal. A drop in your peak flow numbers on one or more days may mean that you are starting to have an asthma attack, even if you are not having symptoms.  When you have asthma symptoms, write them down in a journal.  Avoid  asthma triggers Work with your health care provider to find out what your asthma triggers are. This can be done by:  Being tested for allergies.  Keeping a journal that notes when asthma attacks occur and what may have contributed to them.  Asking your health care provider whether other medical conditions make your asthma worse. Common asthma triggers include:  Dust.  Smoke. This includes campfire smoke and secondhand smoke from tobacco products.  Pet dander.  Trees, grasses or pollens.  Very cold, dry, or humid air.  Mold.  Foods that contain high amounts of sulfites.  Strong smells.  Engine exhaust and air pollution.  Aerosol sprays and fumes from household cleaners.  Household pests and their droppings, including dust mites and cockroaches.  Certain medicines, including NSAIDs. Once you have determined your asthma triggers, take steps to avoid them. Depending on your triggers, you may be able to reduce the chance of an asthma attack by:  Keeping your home clean. Have someone dust and vacuum your home for you 1 or 2 times a week. If possible, have them use a high-efficiency particulate arrestance (HEPA) vacuum.  Washing your sheets weekly in hot water.  Using allergy-proof mattress covers and casings on your bed.  Keeping pets out of your home.  Taking care of mold and water problems in your home.  Avoiding areas where people smoke.  Avoiding using strong perfumes or odor sprays.  Avoid spending a  lot of time outdoors when pollen counts are high and on very windy days.  Talking with your health care provider before stopping or starting any new medicines. Medicines Take over-the-counter and prescription medicines only as told by your health care provider. Many asthma attacks can be prevented by carefully following your medicine schedule. Taking your medicines correctly is especially important when you cannot avoid certain asthma triggers. Even if you are doing  well, do not stop taking your medicine and do not take less medicine. Act quickly If an asthma attack happens, acting quickly can decrease how severe it is and how long it lasts. Take these actions:  Pay attention to your symptoms. If you are coughing, wheezing, or having difficulty breathing, do not wait to see if your symptoms go away on their own. Follow your asthma action plan.  If you have followed your asthma action plan and your symptoms are not improving, call your health care provider or seek immediate medical care at the nearest hospital. It is important to write down how often you need to use your fast-acting rescue inhaler. You can track how often you use an inhaler in your journal. If you are using your rescue inhaler more often, it may mean that your asthma is not under control. Adjusting your asthma treatment plan may help you to prevent future asthma attacks and help you to gain better control of your condition. How can I prevent an asthma attack when I exercise? Exercise is a common asthma trigger. To prevent asthma attacks during exercise:  Follow advice from your health care provider about whether you should use your fast-acting inhaler before exercising. Many people with asthma experience exercise-induced bronchoconstriction (EIB). This condition often worsens during vigorous exercise in cold, humid, or dry environments. Usually, people with EIB can stay very active by using a fast-acting inhaler before exercising.  Avoid exercising outdoors in very cold or humid weather.  Avoid exercising outdoors when pollen counts are high.  Warm up and cool down when exercising.  Stop exercising right away if asthma symptoms start. Consider taking part in exercises that are less likely to cause asthma symptoms such as:  Indoor swimming.  Biking.  Walking.  Hiking.  Playing football. This information is not intended to replace advice given to you by your health care provider.  Make sure you discuss any questions you have with your health care provider. Document Released: 06/30/2009 Document Revised: 03/12/2016 Document Reviewed: 12/27/2015 Elsevier Interactive Patient Education  2019 [employer].  Asthma, Adult  Asthma is a long-term (chronic) condition in which the airways get tight and narrow. The airways are the breathing passages that lead from the nose and mouth down into the lungs. A person with asthma will have times when symptoms get worse. These are called asthma attacks. They can cause coughing, whistling sounds when you breathe (wheezing), shortness of breath, and chest pain. They can make it hard to breathe. There is no cure for asthma, but medicines and lifestyle changes can help control it. There are many things that can bring on an asthma attack or make asthma symptoms worse (triggers). Common triggers include:  Mold.  Dust.  Cigarette smoke.  Cockroaches.  Things that can cause allergy symptoms (allergens). These include animal skin flakes (dander) and pollen from trees or grass.  Things that pollute the air. These may include household cleaners, wood smoke, smog, or chemical odors.  Cold air, weather changes, and wind.  Crying or laughing hard.  Stress.  Certain medicines  or drugs.  Certain foods such as dried fruit, potato chips, and grape juice.  Infections, such as a cold or the flu.  Certain medical conditions or diseases.  Exercise or tiring activities. Asthma may be treated with medicines and by staying away from the things that cause asthma attacks. Types of medicines may include:  Controller medicines. These help prevent asthma symptoms. They are usually taken every day.  Fast-acting reliever or rescue medicines. These quickly relieve asthma symptoms. They are used as needed and provide short-term relief.  Allergy medicines if your attacks are brought on by allergens.  Medicines to help control the body's defense  (immune) system. Follow these instructions at home: Avoiding triggers in your home  Change your heating and air conditioning filter often.  Limit your use of fireplaces and wood stoves.  Get rid of pests (such as roaches and mice) and their droppings.  Throw away plants if you see mold on them.  Clean your floors. Dust regularly. Use cleaning products that do not smell.  Have someone vacuum when you are not home. Use a vacuum cleaner with a HEPA filter if possible.  Replace carpet with wood, tile, or vinyl flooring. Carpet can trap animal skin flakes and dust.  Use allergy-proof pillows, mattress covers, and box spring covers.  Wash bed sheets and blankets every week in hot water. Dry them in a dryer.  Keep your bedroom free of any triggers.  Avoid pets and keep windows closed when things that cause allergy symptoms are in the air.  Use blankets that are made of polyester or cotton.  Clean bathrooms and kitchens with bleach. If possible, have someone repaint the walls in these rooms with mold-resistant paint. Keep out of the rooms that are being cleaned and painted.  Wash your hands often with soap and water. If soap and water are not available, use hand sanitizer.  Do not allow anyone to smoke in your home. General instructions  Take over-the-counter and prescription medicines only as told by your doctor. ? Talk with your doctor if you have questions about how or when to take your medicines. ? Make note if you need to use your medicines more often than usual.  Do not use any products that contain nicotine or tobacco, such as cigarettes and e-cigarettes. If you need help quitting, ask your doctor.  Stay away from secondhand smoke.  Avoid doing things outdoors when allergen counts are high and when air quality is low.  Wear a ski mask when doing outdoor activities in the winter. The mask should cover your nose and mouth. Exercise indoors on cold days if you can.  Warm  up before you exercise. Take time to cool down after exercise.  Use a peak flow meter as told by your doctor. A peak flow meter is a tool that measures how well the lungs are working.  Keep track of the peak flow meter's readings. Write them down.  Follow your asthma action plan. This is a written plan for taking care of your asthma and treating your attacks.  Make sure you get all the shots (vaccines) that your doctor recommends. Ask your doctor about a flu shot and a pneumonia shot.  Keep all follow-up visits as told by your doctor. This is important. Contact a doctor if:  You have wheezing, shortness of breath, or a cough even while taking medicine to prevent attacks.  The mucus you cough up (sputum) is thicker than usual.  The mucus you cough up  changes from clear or white to yellow, green, gray, or bloody.  You have problems from the medicine you are taking, such as: ? A rash. ? Itching. ? Swelling. ? Trouble breathing.  You need reliever medicines more than 2-3 times a week.  Your peak flow reading is still at 50-79% of your personal best after following the action plan for 1 hour.  You have a fever. Get help right away if:  You seem to be worse and are not responding to medicine during an asthma attack.  You are short of breath even at rest.  You get short of breath when doing very little activity.  You have trouble eating, drinking, or talking.  You have chest pain or tightness.  You have a fast heartbeat.  Your lips or fingernails start to turn blue.  You are light-headed or dizzy, or you faint.  Your peak flow is less than 50% of your personal best.  You feel too tired to breathe normally. Summary  Asthma is a long-term (chronic) condition in which the airways get tight and narrow. An asthma attack can make it hard to breathe.  Asthma cannot be cured, but medicines and lifestyle changes can help control it.  Make sure you understand how to avoid  triggers and how and when to use your medicines. This information is not intended to replace advice given to you by your health care provider. Make sure you discuss any questions you have with your health care provider. Document Released: 12/29/2007 Document Revised: 08/16/2016 Document Reviewed: 08/16/2016 Elsevier Interactive Patient Education  2019 [employer].

## 2018-07-27 NOTE — Progress Notes (Signed)
Leslie Dixon, is a 73 y.o. female  Leslie Dixon  Leslie Dixon  Leslie Dixon  CC:  Chief Complaint  Patient presents with  . Establish Care  . Follow-up    [facility] 12/23: bronchitis. has completed all of her prescribed medications. states that she felt like she was getting better but then started back having the cough, wheezing, SHOB & R sided rib pain       Leslie Dixon has LACTOSE INTOLERANCE; HYPOKALEMIA; Ocular migraine; CARPAL TUNNEL SYNDROME; VARICOSE VEIN; Allergic rhinitis due to pollen; VOCAL CORD DISORDER; Fibromyalgia; NEPHROLITHIASIS, HX OF; Idiopathic benign Leukopenia; GERD (gastroesophageal reflux disease); Osteoporosis; Chronic back pain; h/o GAD (had panic in past); Horseshoe kidney; Hydronephrosis; Environmental and seasonal allergies; Encounter for wellness examination; Intolerance to cold; History of non anemic vitamin B12 deficiency; Right thyroid nodule; Insomnia; Chronic vertigo; Mixed hearing loss of right ear; Family history of breast cancer- sister at age 33- new onset; History of thyroid nodule- R side ( Dr Loanne Drilling eval in 10/17); Status post lumbar surgery- foot drop R foot; Irritable bowel syndrome with diarrhea; Migraine without status migrainosus, not intractable; Cervical radiculopathy; Chronic pain; DDD (degenerative disc disease), cervical; SOB (shortness of breath); Gastroesophageal reflux disease; Chronic neck pain; Osteoarthritis; Lumbar post-laminectomy syndrome; Elevated HDL; and Panic attack as reaction to stress on their problem list.    HPI: Leslie Dixon is a 73 y.o. female is here today to establish care and evaluation of worsening cough, wheezing, and shortness of breath. Leslie Dixon reports an extensive history of environmental and seasonal allergies. Reports a distant history back to 2000-2001 in which she was referred to pulmonology and placed on an maintenance inhaler. At that time she underwent pulmonary function studies which were normal at that time. She reports this  current prolonged episode of recurrent respiratory problems began initially in April after she completed yard work and was exposed to large amounts of pine straw. She developed severe wheezing, coughing, and shortness of breath. Symptoms were treated with 1-2 courses of prednisone and antibiotic back in late April 2019, illness resolve. In review of EMR, chest x-ray was performed and impression was significant for reactive airway disease vs chronic bronchitis. Current episode of illness began in September and has gradually worsened. She complains of intervals of SOB, wheezing, and coughing since this time. She grew concern earlier this month when she developed chest tightness in addition to the wheezing, coughing, and shortness of breath. She presented to urgent care on 07/17/2018 where she was treated for chronic bronchitis and prescribed albuterol inhaler, treated with Doxycyline, and tessalon pearls for cough. She continues to battle with persistent cough and wheezing. She has been unable to volunteer at her job due to symptoms. Reports albuterol caused her to experience an elevated heart rate, therefore she hasn't used recently. Endorses rib and back pain which she associates with chronic coughing. Denies fever, sputum production, or  new weakness.       Current medications: Current Outpatient Medications:  .  ibandronate (BONIVA) 150 MG tablet, TAKE 1 TABLET BY MOUTH  EVERY 30 DAYS, Disp: 3 tablet, Rfl: 1 .  loratadine (CLARITIN) 10 MG tablet, Take 10 mg by mouth daily., Disp: , Rfl:  .  meloxicam (MOBIC) 7.5 MG tablet, Take 1-2 tablets daily for joint pains, Disp: 180 tablet, Rfl: 3 .  omeprazole (PRILOSEC) 20 MG capsule, Take 1 capsule (20 mg total) by mouth 2 (two) times daily before a meal., Disp: 30 capsule, Rfl: 3 .  SUMAtriptan (IMITREX) 100 MG tablet, TAKE 1 2 (  ONE HALF) TABLET BY MOUTH AS NEEDED FOR MIGRAINE, Disp: , Rfl: 3 .  triamcinolone (NASACORT ALLERGY 24HR) 55 MCG/ACT AERO nasal  inhaler, Place 1 spray into the nose daily., Disp: , Rfl:  .  TURMERIC PO, Take 1 tablet by mouth daily at 8 pm. , Disp: , Rfl:  .  vitamin B-12 (CYANOCOBALAMIN) 1000 MCG tablet, Take 1,000 mcg by mouth daily., Disp: , Rfl:  .  Vitamin D, Cholecalciferol, 1000 units CAPS, Take 1 capsule by mouth daily. , Disp: , Rfl:  .  vitamin E 400 UNIT capsule, Take 400 Units by mouth daily. Patient takes 2 a day, Disp: , Rfl:  .  cetirizine (ZYRTEC) 10 MG tablet, Take 1 tablet (10 mg total) by mouth daily., Disp: 30 tablet, Rfl: 11 .  erythromycin ophthalmic ointment, Place 1 application into the right eye at bedtime., Disp: 3.5 g, Rfl: 0 .  fluticasone (FLOVENT HFA) 44 MCG/ACT inhaler, Inhale 2 puffs into the lungs 2 (two) times daily., Disp: 1 Inhaler, Rfl: 12 .  montelukast (SINGULAIR) 10 MG tablet, Take 1 tablet (10 mg total) by mouth at bedtime., Disp: 30 tablet, Rfl: 3 .  promethazine (PHENERGAN) 6.25 MG/5ML syrup, Take 5 mLs (6.25 mg total) by mouth at bedtime as needed (cough rhinitis symptoms at bedtime)., Disp: 120 mL, Rfl: 0   Pertinent family medical history: family history includes Breast cancer (age of onset: 89) in her maternal aunt and sister; Cancer in her brother, father, maternal aunt, and maternal grandfather; Migraines in her sister; Neurodegenerative disease in her mother; Thyroid disease in her daughter.   Allergies  Allergen Reactions  . Peanut Oil Anaphylaxis  . Clarithromycin     REACTION: GI  . Codeine     REACTION: nausea and vomiting  . Lactose     Other reaction(s): GI Upset (intolerance)  . 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 Penicillins     REACTION: per allergy testing/Patient states she has taken Augmentin without complications.   . Pregabalin     REACTION: headache, several side effects  . Sulfa Antibiotics Other (See Comments)    REACTION: Hives, wheezing  . Sulfonamide Derivatives     REACTION: Hives, wheezing  .  Cefdinir Rash    Social History   Socioeconomic History  . Marital status: Married    Spouse name: Not on file  . Number of children: 2  . Years of education: Not on file  . Highest education level: Not on file  Occupational History    Comment: retired Press photographer  Social Needs  . Financial resource strain: Not on file  . Food insecurity:    Worry: Not on file    Inability: Not on file  . Transportation needs:    Medical: Not on file    Non-medical: Not on file  Tobacco Use  . Smoking status: Never Smoker  . Smokeless tobacco: Never Used  Substance and Sexual Activity  . Alcohol use: No    Comment: occassionally  . Drug use: No  . Sexual activity: Yes    Birth control/protection: None  Lifestyle  . Physical activity:    Days per week: Not on file    Minutes per session: Not on file  . Stress: Not on file  Relationships  . Social connections:    Talks on phone: Not on file    Gets together: Not on file    Attends religious service: Not on file  Active member of club or organization: Not on file    Attends meetings of clubs or organizations: Not on file    Relationship status: Not on file  . Intimate partner violence:    Fear of current or ex partner: Not on file    Emotionally abused: Not on file    Physically abused: Not on file    Forced sexual activity: Not on file  Other Topics Concern  . Not on file  Social History Narrative  . Not on file    Review of Systems: Pertinent negatives listed in HPI   Objective:   Vitals:   07/27/18 1059  BP: 136/72  Pulse: 85  Resp: 18  Temp: 97.7 F (36.5 C)  SpO2: 95%    BP Readings from Last 3 Encounters:  07/27/18 136/72  07/17/18 122/81  05/31/18 105/63    Filed Weights   07/27/18 1059  Weight: 137 lb 3.2 oz (62.2 kg)      Physical Exam: Constitutional: Patient appears well-developed and well-nourished. No distress. HENT: Normocephalic, atraumatic, External right and left ear normal.  Oropharynx is clear and moist.  Eyes: Right eye Conjunctiva erythematous with lower lid edema present. Discharge present right eye. EOM are normal. PERRLA. Neck: Normal ROM. Neck supple. No JVD. No tracheal deviation. No thyromegaly. CVS: RRR, S1/S2 +, no murmurs, no gallops, no carotid bruit.  Pulmonary: Positive rhonchi, expiratory wheezes. Persistent hacking cough. Negative rales.  Abdominal: Soft. BS +, no distension, tenderness, rebound or guarding.  Musculoskeletal: Normal range of motion. No edema and no tenderness.  Neuro: Alert. Normal muscle tone coordination. Normal gait.  Skin: Skin is warm and dry. No rash noted. Not diaphoretic. No erythema. No pallor. Psychiatric: Normal mood and affect. Behavior, judgment, thought content normal.  Lab Results (prior encounters)  Lab Results  Component Value Date   WBC 3.1 (L) 05/23/2018   HGB 14.0 05/23/2018   HCT 41.3 05/23/2018   MCV 88 05/23/2018   PLT 191 05/23/2018   Lab Results  Component Value Date   CREATININE 0.84 05/23/2018   BUN 20 05/23/2018   NA 142 05/23/2018   K 4.2 05/23/2018   CL 108 (H) 05/23/2018   CO2 22 05/23/2018    Lab Results  Component Value Date   HGBA1C 5.4 05/23/2018       Component Value Date/Time   CHOL 172 05/23/2018 0835   TRIG 35 05/23/2018 0835   HDL 81 05/23/2018 0835   CHOLHDL 2.1 05/23/2018 0835   CHOLHDL 2.1 02/20/2016 0758   VLDL 9 02/20/2016 0758   LDLCALC 84 05/23/2018 0835        Assessment and plan:  1. Moderate persistent reactive airway disease with acute exacerbation 2. Chronic bronchitis, unspecified chronic bronchitis type ([place]) Given patients history if environmental allergies, previous findings on chest x-ray, exacerbation followed by recovery of respiratory symptoms, patient likely has underlying asthma. She is a non-smoker, however, has suffered from environmental allergies for several years, which may be the underlying cause of condition. Repeating chest x-ray to  rule our pneumonia given length of time symptoms have been present.  If chest x-ray is normal, will not add an additional antibiotic. For now will add a LABA inhaler-Fluticasone 2 puffs twice daily for preventative therapy. Initiate antihistamine therapy with Cetirizine and Singulair daily  Continue benzonatate for cough. Will promethazine syrup to facilitate sleep and reduce cough. Patient allergic to codeine, there promethazine only is being prescribed.  Referral placed to pulmonology as patient will benefit  from updated PFTs as condition has worsened.  3. Conjunctivitis , acute right eye -Erythromycin ointment, apply to right lower eye lid at bedtime x 5 days.  Meds ordered this encounter  Medications  . fluticasone (FLOVENT HFA) 44 MCG/ACT inhaler    Sig: Inhale 2 puffs into the lungs 2 (two) times daily.    Dispense:  1 Inhaler    Refill:  12  . cetirizine (ZYRTEC) 10 MG tablet    Sig: Take 1 tablet (10 mg total) by mouth daily.    Dispense:  30 tablet    Refill:  11  . montelukast (SINGULAIR) 10 MG tablet    Sig: Take 1 tablet (10 mg total) by mouth at bedtime.    Dispense:  30 tablet    Refill:  3  . erythromycin ophthalmic ointment    Sig: Place 1 application into the right eye at bedtime.    Dispense:  3.5 g    Refill:  0  . promethazine (PHENERGAN) 6.25 MG/5ML syrup    Sig: Take 5 mLs (6.25 mg total) by mouth at bedtime as needed (cough rhinitis symptoms at bedtime).    Dispense:  120 mL    Refill:  0   Orders Placed This Encounter  Procedures  . DG Chest 2 View  . Ambulatory referral to Pulmonology    A total of 40  minutes spent, greater than 50 % of this time was spent counseling and coordination of care.   Return in about 6 weeks (around 09/07/2018).   The patient was given clear instructions to go to ER or return to medical center if symptoms don't improve, worsen or new problems develop. The patient verbalized understanding. The patient was advised  to  call and obtain lab results if they haven't heard anything from out office within 7-10 business days.  Molli Barrows, FNP Primary Care at [facility] [address] [place] [address] [phone]: [phone]    This note has been created with Dragon speech recognition software and Engineer, materials. Any transcriptional errors are unintentional.

## 2018-07-27 NOTE — Telephone Encounter (Signed)
Contact patient to advise chest x-ray was negative for pneumonia although continues to show hyperinflation and bronchial marking of the lungs which is consistent finding in patients with asthma or chronic bronchitis. She should continue with the treatment prescribed and I'll see her back in 6 weeks.

## 2018-07-28 DIAGNOSIS — J0100 Acute maxillary sinusitis, unspecified: Secondary | ICD-10-CM | POA: Diagnosis not present

## 2018-07-28 DIAGNOSIS — J340 Abscess, furuncle and carbuncle of nose: Secondary | ICD-10-CM | POA: Diagnosis not present

## 2018-08-21 ENCOUNTER — Other Ambulatory Visit
Admission: RE | Admit: 2018-08-21 | Discharge: 2018-08-21 | Disposition: A | Discharge status: Home / Self Care | Payer: Medicare Other | Source: Ambulatory Visit | Attending: Pulmonary Disease | Admitting: Pulmonary Disease

## 2018-08-21 ENCOUNTER — Encounter: Payer: Self-pay | Admitting: Pulmonary Disease

## 2018-08-21 ENCOUNTER — Ambulatory Visit (INDEPENDENT_AMBULATORY_CARE_PROVIDER_SITE_OTHER): Payer: Medicare Other | Admitting: Pulmonary Disease

## 2018-08-21 VITALS — BP 128/80 | HR 81 | Ht 66.5 in | Wt 138.0 lb

## 2018-08-21 DIAGNOSIS — J3089 Other allergic rhinitis: Secondary | ICD-10-CM | POA: Diagnosis not present

## 2018-08-21 DIAGNOSIS — J4520 Mild intermittent asthma, uncomplicated: Secondary | ICD-10-CM | POA: Insufficient documentation

## 2018-08-21 DIAGNOSIS — R059 Cough, unspecified: Secondary | ICD-10-CM

## 2018-08-21 DIAGNOSIS — J328 Other chronic sinusitis: Secondary | ICD-10-CM | POA: Diagnosis not present

## 2018-08-21 DIAGNOSIS — R05 Cough: Secondary | ICD-10-CM | POA: Diagnosis not present

## 2018-08-21 MED ORDER — SPACER/AERO-HOLDING CHAMBERS DEVI
1.0000 | Freq: Every day | 0 refills | Status: DC
Start: 2018-08-21 — End: 2019-05-29

## 2018-08-21 MED ORDER — MONTELUKAST SODIUM 10 MG PO TABS: 10.0000 mg | ORAL_TABLET | Freq: Every day | ORAL | 3 refills | Status: DC

## 2018-08-21 MED ORDER — OMEPRAZOLE 20 MG PO CPDR: 20.0000 mg | DELAYED_RELEASE_CAPSULE | Freq: Two times a day (BID) | ORAL | 3 refills | Status: DC

## 2018-08-21 NOTE — Patient Instructions (Signed)
1.  We will start you on Singulair (montelukast) 10 mg (1 tablet) daily.  2.  Continue using Flovent for now 2 inhalations twice a day we will give you a spacer for proper dispensing.  3.  We will obtain allergy test in the blood.  4.  We will see you in follow-up in 4 to 6 weeks time.

## 2018-08-21 NOTE — Progress Notes (Signed)
Subjective:    Patient ID: Leslie Dixon, female    DOB: 06/23/1946, 73 y.o.   MRN: [id number]  HPI Ms. coffee is a 73 year old lifelong never smoker who presents for evaluation of potential asthma.  The patient notes that in April 2019 she developed bronchitis after working in her garden.  She states that usually with seasonal change she gets a bout of bronchitis.  She attributes this to exposure to dust in her garden and particularly when she lays down pine straw.  She states that she did not use a mask that day and therefore she had bronchitis.  This cleared up after treatment with antibiotics and steroids.  However in December TF she had another bout of bronchitis with associated sinus infection and was noted to be "wheezy".  She was treated with albuterol inhaler which helped her chest tightness and wheezing however made her heart race.  She has had issues with gastroesophageal reflux and actually has had to undergo esophageal dilation previously.  She was noted to need an albuterol treatment after her esophageal dilation due to wheezing.  She notes that steroids/prednisone help.  He does not recall being asthmatic as a child.  She does have issues with recurrent sinusitis for which she sees Dr. Pryor Ochoa of ENT and most recently had been placed on Augmentin 875 mg twice a day.  She states that today is a good day for her and she actually feels better.  She is still on a PPI for gastroesophageal reflux.  She recently was started on Flovent and feels that this may be helping but is not sure if she is getting the medication completely into her lungs.  She does not describe any orthopnea or paroxysmal nocturnal dyspnea.  No lower extremity edema.  Asked medical history, surgical history, family history has been reviewed and is as noted.  Social history patient is a lifelong never smoker.  She has dogs in the home they are short hair (schnauzers) she has never been in the [employer].  However, her husband  was a Nature conservation officer man and she did travel with him throughout the country.  She has resided in [place] for the last 25 years.  She has not had any recent travel outside of the [place].  No history of tuberculosis.   Review of Systems  Constitutional: Negative.   HENT: Positive for congestion, postnasal drip, sneezing and sore throat.   Eyes: Negative.   Respiratory: Positive for cough and chest tightness.   Cardiovascular: Negative.   Gastrointestinal: Negative.   Endocrine: Negative.   Genitourinary: Negative.   Musculoskeletal: Negative.   Skin: Negative.   Allergic/Immunologic: Negative.   Neurological: Negative.   Hematological: Negative.   Psychiatric/Behavioral: Negative.   All other systems reviewed and are negative.      Objective:   Physical Exam Vitals signs reviewed.  Constitutional:      Appearance: Normal appearance. She is normal weight.  HENT:     Head: Normocephalic and atraumatic.     Right Ear: External ear normal.     Left Ear: External ear normal.     Nose: Congestion and rhinorrhea present.     Mouth/Throat:     Mouth: Mucous membranes are moist.     Pharynx: Oropharynx is clear. No oropharyngeal exudate.  Eyes:     General: No scleral icterus.    Extraocular Movements: Extraocular movements intact.     Conjunctiva/sclera: Conjunctivae normal.     Pupils: Pupils are equal, round, and reactive  to light.  Neck:     Musculoskeletal: Normal range of motion and neck supple.  Cardiovascular:     Rate and Rhythm: Normal rate and regular rhythm.     Pulses: Normal pulses.     Heart sounds: Normal heart sounds. No murmur.  Pulmonary:     Effort: Pulmonary effort is normal.     Breath sounds: Rhonchi present. No wheezing.  Abdominal:     General: Abdomen is flat. There is no distension.     Palpations: Abdomen is soft.  Musculoskeletal: Normal range of motion.     Right lower leg: No edema.     Left lower leg: No edema.  Skin:    General: Skin is warm  and dry.  Neurological:     General: No focal deficit present.     Mental Status: She is alert and oriented to person, place, and time.  Psychiatric:        Mood and Affect: Mood normal.        Behavior: Behavior normal.    We did perform spirometry today she does not appear to have obstruction.  FEV1 was 2.0 L or 85% predicted FEV1/FVC was 75%.       Assessment & Plan:   1.  Cough: I suspect this is due to upper airway cough syndrome related to perennial allergic rhinitis.  She also appears to have a sinusitis currently being treated.  We will treat issues as below.  2.  Mild intermittent reactive airways disease: Continue Flovent for now, a spacer was provided for her so that she will have proper dispensing of the medication.  She may continue albuterol as needed.  3.  Perennial rhinitis: Will obtain allergy panel (RAST).  Treat with montelukast 10 mg daily.  4.  Acute on chronic sinusitis: This issue adds complexity to her management and is likely a trigger for her symptoms.  Thank you for allowing [place] to participate in this patient's care.  We will see him in follow-up in 4 to 6 weeks time.  She is to contact us prior to that time should any new difficulties arise.

## 2018-08-23 LAB — ALLERGENS W/TOTAL IGE AREA 2
Alternaria Alternata IgE: <0.1 kU/L
Cat Dander IgE: 0.36 kU/L — AB
Cedar, Mountain IgE: <0.1 kU/L
Cladosporium Herbarum IgE: <0.1 kU/L
Cockroach, German IgE: <0.1 kU/L
Common Silver Birch IgE: <0.1 kU/L
Cottonwood IgE: <0.1 kU/L
D Farinae IgE: 0.65 kU/L — AB
D Pteronyssinus IgE: 0.65 kU/L — AB
E005-IGE DOG DANDER: 0.16 kU/L — AB
Elm, American IgE: <0.1 kU/L
IgE (Immunoglobulin E), Serum: 94 IU/mL (ref 6–495)
Johnson Grass IgE: <0.1 kU/L
Maple/Box Elder IgE: <0.1 kU/L
Mouse Urine IgE: <0.1 kU/L
Oak, White IgE: <0.1 kU/L
Pecan, Hickory IgE: <0.1 kU/L
Penicillium Chrysogen IgE: <0.1 kU/L
Pigweed, Rough IgE: <0.1 kU/L
Ragweed, Short IgE: <0.1 kU/L
Sheep Sorrel IgE Qn: <0.1 kU/L
Timothy Grass IgE: <0.1 kU/L
White Mulberry IgE: <0.1 kU/L

## 2018-08-25 ENCOUNTER — Encounter: Payer: Self-pay | Admitting: Pulmonary Disease

## 2018-09-01 DIAGNOSIS — J0100 Acute maxillary sinusitis, unspecified: Secondary | ICD-10-CM | POA: Diagnosis not present

## 2018-09-06 ENCOUNTER — Ambulatory Visit (INDEPENDENT_AMBULATORY_CARE_PROVIDER_SITE_OTHER): Payer: Medicare Other | Admitting: Family Medicine

## 2018-09-06 ENCOUNTER — Encounter: Payer: Self-pay | Admitting: Family Medicine

## 2018-09-06 ENCOUNTER — Telehealth: Payer: Self-pay | Admitting: Family Medicine

## 2018-09-06 VITALS — BP 92/62 | HR 67 | Temp 98.0°F | Resp 17 | Ht 66.5 in | Wt 137.8 lb

## 2018-09-06 DIAGNOSIS — J329 Chronic sinusitis, unspecified: Secondary | ICD-10-CM | POA: Diagnosis not present

## 2018-09-06 DIAGNOSIS — Z23 Encounter for immunization: Secondary | ICD-10-CM

## 2018-09-06 DIAGNOSIS — E049 Nontoxic goiter, unspecified: Secondary | ICD-10-CM

## 2018-09-06 DIAGNOSIS — J4520 Mild intermittent asthma, uncomplicated: Secondary | ICD-10-CM | POA: Diagnosis not present

## 2018-09-06 MED ORDER — FLUCONAZOLE 150 MG PO TABS: 150.0000 mg | ORAL_TABLET | Freq: Once | ORAL | 0 refills | Status: AC

## 2018-09-06 MED ORDER — ZOSTER VAC RECOMB ADJUVANTED 50 MCG/0.5ML IM SUSR: 0.5000 mL | Freq: Once | INTRAMUSCULAR | 0 refills | Status: AC

## 2018-09-06 NOTE — Progress Notes (Signed)
Established Patient Office Visit  Subjective:  Patient ID: BAO COREAS, female    DOB: 1945-11-30  Age: 73 y.o. MRN: [id number]  CC:  Chief Complaint  Patient presents with  . Cough  . Shortness of Breath   HPI Leslie Dixon presents for follow-up of reactive airway disease with persistent exacerbation and enlarged thyroid nodule.   Reactive airway disease with acute exacerbation  Prior office note:07/27/18 [place] reports an extensive history of environmental and seasonal allergies. Reports a distant history back to 2000-2001 in which she was referred to pulmonology and placed on an maintenance inhaler. At that time she underwent pulmonary function studies which were normal at that time. She reports this current prolonged episode of recurrent respiratory problems began initially in April after she completed yard work and was exposed to large amounts of pine straw. She developed severe wheezing, coughing, and shortness of breath. Symptoms were treated with 1-2 courses of prednisone and antibiotic back in late April 2019, illness resolve. In review of EMR, chest x-ray was performed and impression was significant for reactive airway disease vs chronic bronchitis. Current episode of illness began in September and has gradually worsened. She complains of intervals of SOB, wheezing, and coughing since this time. She grew concern earlier this month when she developed chest tightness in addition to the wheezing, coughing, and shortness of breath. She presented to urgent care on 07/17/2018 where she was treated for chronic bronchitis and prescribed albuterol inhaler, treated with Doxycyline, and tessalon pearls for cough. She continues to battle with persistent cough and wheezing. She has been unable to volunteer at her job due to symptoms. Reports albuterol caused her to experience an elevated heart rate, therefore she hasn't used recently. Endorses rib and back pain which she associates with chronic  coughing. Denies fever, sputum production, or  new weakness.   Today 09/06/18-Follow-up Since last office visit patient reports significant improvement of cough and shortness of breath. She has not required her albuterol inhaler recently. Denies wheezing or chest tightness. She was recently seen by [facility] which recommended continuing Flovent and added Singulair for management of perirenal allergies which may precipitate symptoms of reactive airway disease. She has resumed loratadine to assist with persistent congestions and allergy symptoms. She has also been evaluated by ENT which recommend CT of sinus cavity if patient fails treatment with 3rd round of antibiotic. Overall reports significant improvement.  History of thyroid nodule  History of a benign thyroid nodule found during a routine exam. Patient underwent US of thyroid 2017. Since that time she reports nodule has increased in size and is visit with certain neck movements. No prior abnormal thyroid studies.  Asymptomatic of hair thinning, temperature intolerance, fatigue, or changes in activity level.  Requesting a repeat thyroid ultrasound. Past Medical History:  Diagnosis Date  . Blood dyscrasia    LEUKOPENIA- DR. CHISM - NO TREATMENT BUT IS MONITORED  . Chronic back pain   . Chronic back pain   . Fibromyalgia   . Foot drop, right    RELATED TO LUMBAR PROBLEMS  . GERD (gastroesophageal reflux disease)    PRILOSEC PRN  . History of kidney stones   . Horseshoe kidney    BILATERAL  . Leukopenia   . Migraine    OCCULAR MIGRAINES - AURA WITH EYES  . Osteoporosis   . Pain    LUMBAR PAIN - RECENT FALL BECAUSE LEGS GAVE OUT - PT HAS HAD LUMBAR INJECTION SINCE THE FALL THAT HAS HELPED BACK  PAIN  . Pain    CHRONIC RIGHT UPPER QUADRANT PAIN - RELATED TO GALLBLADDER PROBLEM  . Sciatica of right side   . Sinusitis    f/u with Dr. Janace Hoard.   . Vertigo     Past Surgical History:  Procedure Laterality Date  . ABDOMINAL  HYSTERECTOMY  1983  . APPENDECTOMY  1968  . BILATERAL SALPINGOOPHORECTOMY  2000  . BREAST EXCISIONAL BIOPSY Right 1979  . CHOLECYSTECTOMY N/A 04/12/2014   Procedure: LAPAROSCOPIC CHOLECYSTECTOMY WITH INTRAOPERATIVE CHOLANGIOGRAM;  Surgeon: Kaylyn Lim, MD;  Location: [facility];  Service: General;  Laterality: N/A;  . Lumbar/cervical surgeries     CERVICAL FUSION C7-C6 WITH BONE GRAFT; C6-C5 WITH PLATE AND 4 SCREWS;  3 LUMBAR SURGERIES BUT NO FUSION  . NASAL SINUS SURGERY  2010  . right knee     ARTHROSCOPY  . TYMPANOSTOMY TUBE PLACEMENT  07/2017  . WISDOM TOOTH PULLED      Family History  Problem Relation Age of Onset  . Cancer Father        Lung  . Neurodegenerative disease Mother   . Cancer Brother        prostate  . Cancer Maternal Aunt        breast cancer  . Breast cancer Maternal Aunt 66  . Cancer Maternal Grandfather        Leukemia  . Migraines Sister   . Breast cancer Sister 25  . Thyroid disease Daughter     Social History   Socioeconomic History  . Marital status: Married    Spouse name: Not on file  . Number of children: 2  . Years of education: Not on file  . Highest education level: Not on file  Occupational History    Comment: retired Press photographer  Social Needs  . Financial resource strain: Not on file  . Food insecurity:    Worry: Not on file    Inability: Not on file  . Transportation needs:    Medical: Not on file    Non-medical: Not on file  Tobacco Use  . Smoking status: Never Smoker  . Smokeless tobacco: Never Used  Substance and Sexual Activity  . Alcohol use: No    Comment: occassionally  . Drug use: No  . Sexual activity: Yes    Birth control/protection: None  Lifestyle  . Physical activity:    Days per week: Not on file    Minutes per session: Not on file  . Stress: Not on file  Relationships  . Social connections:    Talks on phone: Not on file    Gets together: Not on file    Attends religious service: Not on file     Active member of club or organization: Not on file    Attends meetings of clubs or organizations: Not on file    Relationship status: Not on file  . Intimate partner violence:    Fear of current or ex partner: Not on file    Emotionally abused: Not on file    Physically abused: Not on file    Forced sexual activity: Not on file  Other Topics Concern  . Not on file  Social History Narrative  . Not on file    Outpatient Medications Prior to Visit  Medication Sig Dispense Refill  . amoxicillin-clavulanate (AUGMENTIN) 875-125 MG tablet     . fluticasone (FLOVENT HFA) 44 MCG/ACT inhaler Inhale 2 puffs into the lungs 2 (two) times daily. 1 Inhaler 12  . ibandronate (  BONIVA) 150 MG tablet TAKE 1 TABLET BY MOUTH  EVERY 30 DAYS 3 tablet 1  . loratadine (CLARITIN) 10 MG tablet Take 10 mg by mouth daily.    . meloxicam (MOBIC) 7.5 MG tablet Take 1-2 tablets daily for joint pains 180 tablet 3  . montelukast (SINGULAIR) 10 MG tablet Take 1 tablet (10 mg total) by mouth daily. 90 tablet 3  . mupirocin ointment (BACTROBAN) 2 %     . omeprazole (PRILOSEC) 20 MG capsule Take 1 capsule (20 mg total) by mouth 2 (two) times daily. 180 capsule 3  . Spacer/Aero-Holding Chambers DEVI 1 each by Does not apply route daily. 1 each 0  . SUMAtriptan (IMITREX) 100 MG tablet TAKE 1 2 (ONE HALF) TABLET BY MOUTH AS NEEDED FOR MIGRAINE  3  . triamcinolone (NASACORT ALLERGY 24HR) 55 MCG/ACT AERO nasal inhaler Place 1 spray into the nose daily.    . TURMERIC PO Take 1 tablet by mouth daily at 8 pm.     . vitamin B-12 (CYANOCOBALAMIN) 1000 MCG tablet Take 1,000 mcg by mouth daily.    . Vitamin D, Cholecalciferol, 1000 units CAPS Take 1 capsule by mouth daily.     . vitamin E 400 UNIT capsule Take 400 Units by mouth daily. Patient takes 2 a day    . erythromycin ophthalmic ointment Place 1 application into the right eye at bedtime. 3.5 g 0  . promethazine (PHENERGAN) 6.25 MG/5ML syrup Take 5 mLs (6.25 mg total) by mouth  at bedtime as needed (cough rhinitis symptoms at bedtime). 120 mL 0   No facility-administered medications prior to visit.     Allergies  Allergen Reactions  . Peanut Oil Anaphylaxis  . Clarithromycin     REACTION: GI  . Codeine     REACTION: nausea and vomiting  . Lactose     Other reaction(s): GI Upset (intolerance)  . 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 Penicillins     REACTION: per allergy testing/Patient states she has taken Augmentin without complications.   . Pregabalin     REACTION: headache, several side effects  . Sulfa Antibiotics Other (See Comments)    REACTION: Hives, wheezing  . Sulfonamide Derivatives     REACTION: Hives, wheezing  . Cefdinir Rash    ROS Review of Systems Pertinent negatives listed in HPI  Objective:    Physical Exam BP 92/62   Pulse 67   Temp 98 F (36.7 C) (Oral)   Resp 17   Ht 5' 6.5" (1.689 m)   Wt 137 lb 12.8 oz (62.5 kg)   SpO2 97%   BMI 21.91 kg/m     Physical Exam: Constitutional: Patient appears well-developed and well-nourished. No distress. HENT: Normocephalic, atraumatic, External right and left ear normal. Oropharynx is clear and moist.  Eyes: Conjunctivae and EOM are normal. PERRLA, no scleral icterus. Neck: Normal ROM. Neck fullness noted. No JVD. No tracheal deviation. Slight enlargement of thyroid gland. CVS: RRR, S1/S2 +, no murmurs, no gallops, no carotid bruit.  Pulmonary: Effort and breath sounds normal, no stridor, rhonchi, wheezes, rales.  Abdominal: Soft. BS +, no distension, tenderness, rebound or guarding.  Musculoskeletal: Normal range of motion. No edema and no tenderness.  Neuro: Alert. Normal reflexes, muscle tone coordination. No cranial nerve deficit. Skin: Skin is warm and dry. No rash noted. Not diaphoretic. No erythema. No pallor. Psychiatric: Normal mood and affect. Behavior, judgment, thought content normal. Wt Readings from  Last 3 Encounters:   09/06/18 137 lb 12.8 oz (62.5 kg)  08/21/18 138 lb (62.6 kg)  07/27/18 137 lb 3.2 oz (62.2 kg)   Lab Results  Component Value Date   TSH 2.440 05/23/2018   Lab Results  Component Value Date   WBC 3.1 (L) 05/23/2018   HGB 14.0 05/23/2018   HCT 41.3 05/23/2018   MCV 88 05/23/2018   PLT 191 05/23/2018   Lab Results  Component Value Date   NA 142 05/23/2018   K 4.2 05/23/2018   CHLORIDE 109 01/01/2014   CO2 22 05/23/2018   GLUCOSE 83 05/23/2018   BUN 20 05/23/2018   CREATININE 0.84 05/23/2018   BILITOT 0.4 05/23/2018   ALKPHOS 58 05/23/2018   AST 17 05/23/2018   ALT 12 05/23/2018   PROT 6.1 05/23/2018   ALBUMIN 4.1 05/23/2018   CALCIUM 9.0 05/23/2018   ANIONGAP 13 04/05/2014   Lab Results  Component Value Date   CHOL 172 05/23/2018   Lab Results  Component Value Date   HDL 81 05/23/2018   Lab Results  Component Value Date   LDLCALC 84 05/23/2018   Lab Results  Component Value Date   TRIG 35 05/23/2018   Lab Results  Component Value Date   CHOLHDL 2.1 05/23/2018   Lab Results  Component Value Date   HGBA1C 5.4 05/23/2018     Assessment & Plan:  1. Enlarged thyroid - Thyroid Panel With TSH - US THYROID; Future - Comprehensive metabolic panel  2. Need for shingles vaccine Prescription for Shingrix given.  Patient will return for nurse visit to have injection completed  3. Chronic sinusitis, unspecified location Contincription for Diflucan.  She can simply contact the office.ue management by ENT.  Continue currently prescribed antibiotics.  Diflucan prescribed to reduce the risk of vaginal irritation.  4.  Reactive airway disease mild, intermittent Use Flovent and albuterol as needed.  Resume Singulair as recommended by pulmonology.  4 to 6-week follow-up with pulmonology.  Meds ordered this encounter  Medications  . Zoster Vaccine Adjuvanted [facility]) injection    Sig: Inject 0.5 mLs into the muscle once for 1 dose.    Dispense:  0.5 mL     Refill:  0    Patient will have administered here in PCP office  . fluconazole (DIFLUCAN) 150 MG tablet    Sig: Take 1 tablet (150 mg total) by mouth once for 1 dose. Repeat if needed    Dispense:  2 tablet    Refill:  0    Follow-up: Return in about 6 months (around 03/07/2019) for Chronic follw-up and needs nurse visit for Shingles vaccine .    Molli Barrows, FNP

## 2018-09-06 NOTE — Patient Instructions (Signed)
Thyroid Nodule    A thyroid nodule is an isolated growth of thyroid cells that forms a lump in your thyroid gland. The thyroid gland is a butterfly-shaped gland. It is found in the lower front of your neck. This gland sends chemical messengers (hormones) through your blood to all parts of your body. These hormones are important in regulating your body temperature and helping your body to use energy. Thyroid nodules are common. Most are not cancerous (are benign). You may have one nodule or several nodules.  Different types of thyroid nodules include:  · Nodules that grow and fill with fluid (thyroid cysts).  · Nodules that produce too much thyroid hormone (hot nodules or hyperthyroid).  · Nodules that produce no thyroid hormone (cold nodules or hypothyroid).  · Nodules that form from cancer cells (thyroid cancers).  What are the causes?  Usually, the cause of this condition is not known.  What increases the risk?  Factors that make this condition more likely to develop include:  · Increasing age. Thyroid nodules become more common in people who are older than 75 years of age.  · Gender.  ? Benign thyroid nodules are more common in women.  ? Cancerous (malignant) thyroid nodules are more common in men.  · A family history that includes:  ? Thyroid nodules.  ? Pheochromocytoma.  ? Thyroid carcinoma.  ? Hyperparathyroidism.  · Certain kinds of thyroid diseases, such as Hashimoto thyroiditis.  · Lack of iodine.  · A history of head and neck radiation, such as from X-rays.  What are the signs or symptoms?  It is common for this condition to cause no symptoms. If you have symptoms, they may include:  · A lump in your lower neck.  · Feeling a lump or tickle in your throat.  · Pain in your neck, jaw, or ear.  · Having trouble swallowing.  Hot nodules may cause symptoms that include:  · Weight loss.  · Warm, flushed skin.  · Feeling hot.  · Feeling nervous.  · A racing heartbeat.  Cold nodules may cause symptoms that  include:  · Weight gain.  · Dry skin.  · Brittle hair. This may also occur with hair loss.  · Feeling cold.  · Fatigue.  Thyroid cancer nodules may cause symptoms that include:  · Hard nodules that feel stuck to the thyroid gland.  · Hoarseness.  · Lumps in the glands near your thyroid (lymph nodes).  How is this diagnosed?  A thyroid nodule may be felt by your health care provider during a physical exam. This condition may also be diagnosed based on your symptoms. You may also have tests, including:  · An ultrasound. This may be done to confirm the diagnosis.  · A biopsy. This involves taking a sample from the nodule and looking at it under a microscope to see if the nodule is benign.  · Blood tests to make sure that your thyroid is working properly.  · Imaging tests such as MRI or CT scan may be done if:  ? Your nodule is large.  ? Your nodule is blocking your airway.  ? Cancer is suspected.  How is this treated?  Treatment depends on the cause and size of your nodule or nodules. If the nodule is benign, treatment may not be necessary. Your health care provider may monitor the nodule to see if it goes away without treatment. If the nodule continues to grow, is cancerous, or does not go away:  ·   It may need to be drained with a needle.  · It may need to be removed with surgery.  If you have surgery, part or all of your thyroid gland may need to be removed as well.  Follow these instructions at home:  · Pay attention to any changes in your nodule.  · Take over-the-counter and prescription medicines only as told by your health care provider.  · Keep all follow-up visits as told by your health care provider. This is important.  Contact a health care provider if:  · Your voice changes.  · You have trouble swallowing.  · You have pain in your neck, ear, or jaw that is getting worse.  · Your nodule gets bigger.  · Your nodule starts to make it harder for you to breathe.  Get help right away if:  · You have a sudden  fever.  · You feel very weak.  · Your muscles look like they are shrinking (muscle wasting).  · You have mood swings.  · You feel very restless.  · You feel confused.  · You are seeing or hearing things that other people do not see or hear (having hallucinations).  · You feel suddenly nauseous or throw up.  · You suddenly have diarrhea.  · You have chest pain.  · There is a loss of consciousness.  This information is not intended to replace advice given to you by your health care provider. Make sure you discuss any questions you have with your health care provider.  Document Released: 06/04/2004 Document Revised: 03/14/2016 Document Reviewed: 10/23/2014  Elsevier Interactive Patient Education © 2019 [employer]

## 2018-09-06 NOTE — Telephone Encounter (Signed)
Schedule Thyroid ultrasound please. Patient has no preference of location of [place].

## 2018-09-07 LAB — COMPREHENSIVE METABOLIC PANEL
A/G RATIO: 2 (ref 1.2–2.2)
ALT: 14 IU/L (ref 0–32)
AST: 18 IU/L (ref 0–40)
Albumin: 4.2 g/dL (ref 3.7–4.7)
Alkaline Phosphatase: 62 IU/L (ref 39–117)
BUN/Creatinine Ratio: 28 (ref 12–28)
BUN: 19 mg/dL (ref 8–27)
Bilirubin Total: 0.2 mg/dL (ref 0.0–1.2)
CO2: 18 mmol/L — ABNORMAL LOW (ref 20–29)
Calcium: 9.1 mg/dL (ref 8.7–10.3)
Chloride: 104 mmol/L (ref 96–106)
Creatinine, Ser: 0.67 mg/dL (ref 0.57–1.00)
GFR calc Af Amer: 99 mL/min/{1.73_m2} (ref >59)
GFR calc non Af Amer: 86 mL/min/{1.73_m2} (ref >59)
Globulin, Total: 2.1 g/dL (ref 1.5–4.5)
Glucose: 72 mg/dL (ref 65–99)
POTASSIUM: 4.2 mmol/L (ref 3.5–5.2)
Sodium: 140 mmol/L (ref 134–144)
Total Protein: 6.3 g/dL (ref 6.0–8.5)

## 2018-09-07 LAB — THYROID PANEL WITH TSH
FREE THYROXINE INDEX: 2.1 (ref 1.2–4.9)
T3 UPTAKE RATIO: 26 % (ref 24–39)
T4, Total: 8.2 ug/dL (ref 4.5–12.0)
TSH: 2.31 u[IU]/mL (ref 0.450–4.500)

## 2018-09-11 DIAGNOSIS — H04123 Dry eye syndrome of bilateral lacrimal glands: Secondary | ICD-10-CM | POA: Diagnosis not present

## 2018-09-11 DIAGNOSIS — H02051 Trichiasis without entropian right upper eyelid: Secondary | ICD-10-CM | POA: Diagnosis not present

## 2018-09-11 DIAGNOSIS — H01009 Unspecified blepharitis unspecified eye, unspecified eyelid: Secondary | ICD-10-CM | POA: Diagnosis not present

## 2018-09-11 NOTE — Telephone Encounter (Signed)
Patient's ultrasound in scheduled for 1:45 pm on 09/14/2018 at [facility].  Can you notify patient of appointment?

## 2018-09-11 NOTE — Telephone Encounter (Signed)
Patient would like [place] to be scheduled at [facility]

## 2018-09-12 NOTE — Telephone Encounter (Signed)
Notified patient, no further questions

## 2018-09-14 ENCOUNTER — Ambulatory Visit (HOSPITAL_COMMUNITY): Payer: Medicare Other

## 2018-09-15 DIAGNOSIS — J300 Vasomotor rhinitis: Secondary | ICD-10-CM | POA: Diagnosis not present

## 2018-09-15 DIAGNOSIS — J320 Chronic maxillary sinusitis: Secondary | ICD-10-CM | POA: Diagnosis not present

## 2018-09-17 ENCOUNTER — Other Ambulatory Visit: Payer: Self-pay | Admitting: Family Medicine

## 2018-09-18 ENCOUNTER — Ambulatory Visit (HOSPITAL_COMMUNITY): Payer: Medicare Other

## 2018-09-20 ENCOUNTER — Ambulatory Visit (HOSPITAL_COMMUNITY)
Admission: RE | Admit: 2018-09-20 | Discharge: 2018-09-20 | Disposition: A | Discharge status: Home / Self Care | Payer: Medicare Other | Source: Ambulatory Visit | Attending: Family Medicine | Admitting: Family Medicine

## 2018-09-20 DIAGNOSIS — E049 Nontoxic goiter, unspecified: Secondary | ICD-10-CM | POA: Diagnosis not present

## 2018-09-20 DIAGNOSIS — E041 Nontoxic single thyroid nodule: Secondary | ICD-10-CM | POA: Diagnosis not present

## 2018-09-21 ENCOUNTER — Encounter: Payer: Self-pay | Admitting: Family Medicine

## 2018-09-27 NOTE — Telephone Encounter (Signed)
Patient notified of ultrasound results & recommendations. Expressed understanding. 

## 2018-10-10 ENCOUNTER — Telehealth: Payer: Self-pay | Admitting: Family Medicine

## 2018-10-10 NOTE — Telephone Encounter (Signed)
Paperwork received on @TODAY @   Type of paperwork: handicap  Route received: Patient left at office   Has patient completed their portion of the paperwork: yes   Has office staff updated demographics of paperwork:  yes  Paperwork routed to provider :  yes

## 2018-10-10 NOTE — Telephone Encounter (Signed)
Completed. Placed in outgoing mail.

## 2018-10-16 ENCOUNTER — Ambulatory Visit: Payer: Medicare Other | Admitting: Pulmonary Disease

## 2018-11-17 ENCOUNTER — Telehealth: Payer: Self-pay | Admitting: Pulmonary Disease

## 2018-11-17 MED ORDER — FLUTICASONE PROPIONATE HFA 44 MCG/ACT IN AERO: 2.0000 | INHALATION_SPRAY | Freq: Two times a day (BID) | RESPIRATORY_TRACT | 0 refills | Status: DC

## 2018-11-17 NOTE — Telephone Encounter (Signed)
Rx for Flovent 76mcg has been sent to preferred pharmacy.  Pt is aware and voiced her understanding.  Nothing further is needed.

## 2018-12-20 DIAGNOSIS — Z01419 Encounter for gynecological examination (general) (routine) without abnormal findings: Secondary | ICD-10-CM | POA: Diagnosis not present

## 2018-12-20 DIAGNOSIS — Z6822 Body mass index (BMI) 22.0-22.9, adult: Secondary | ICD-10-CM | POA: Diagnosis not present

## 2019-01-01 ENCOUNTER — Ambulatory Visit: Payer: Medicare Other | Admitting: Pulmonary Disease

## 2019-01-05 ENCOUNTER — Ambulatory Visit (INDEPENDENT_AMBULATORY_CARE_PROVIDER_SITE_OTHER): Payer: Medicare Other | Admitting: Pulmonary Disease

## 2019-01-05 ENCOUNTER — Encounter: Payer: Self-pay | Admitting: Pulmonary Disease

## 2019-01-05 DIAGNOSIS — J4520 Mild intermittent asthma, uncomplicated: Secondary | ICD-10-CM

## 2019-01-05 DIAGNOSIS — R059 Cough, unspecified: Secondary | ICD-10-CM

## 2019-01-05 DIAGNOSIS — R05 Cough: Secondary | ICD-10-CM

## 2019-01-05 DIAGNOSIS — K219 Gastro-esophageal reflux disease without esophagitis: Secondary | ICD-10-CM | POA: Diagnosis not present

## 2019-01-05 DIAGNOSIS — J3089 Other allergic rhinitis: Secondary | ICD-10-CM | POA: Diagnosis not present

## 2019-01-05 NOTE — Progress Notes (Signed)
   Subjective:    Patient ID: Leslie Dixon, female    DOB: 1946-03-27, 73 y.o.   MRN: [id number] The patient is evaluated today via telephone visit per her request.  This in the setting of COVID-19 social distancing.  The patient understands the limitations of visit by phone including the inability to perform a physical exam.  The patient agreed to proceed.  I connected with the patient via telephone and utilized 2 identifiers to ensure I was speaking to the right patient.  Visit was done at 11:40 AM.   HPI  The patient is a 73 year old lifelong never smoker whom we first evaluated on 27 January for the issue of cough.  She was noted to have history consistent with upper airway cough syndrome related to perennial allergic rhinitis.  She also has issues with recurrent sinusitis.  Since last visit she was evaluated by her ENT physician who concurred with our management of addressing gastroesophageal reflux and instituting consistent nasal hygiene.  Since her prior visit here she has done markedly better by maintaining strict nasal hygiene.  She feels montelukast is helping with some of her perennial rhinitis symptoms but feels that actually the main contributor has been a prescription for ipratropium nasal solution provided by her ENT physician.  She does have mild intermittent reactive airways disease and has been using Flovent but has noted that she does develop some hoarseness with the medication despite rinsing mouth afterwards.  She has not had any fevers, chills or sweats.  She has been following strict antireflux measures and has been consistent in using her PPI.  Overall she feels that she is markedly improved.  Her RAST test was positive for mild reactions to dust mites cat and dog dander.  He did not have eosinophilia on CBC.  Review of Systems  Constitutional: Negative.   HENT: Positive for congestion and postnasal drip.   Eyes: Negative.   Respiratory: Positive for cough (Markedly  improved).   Cardiovascular: Negative.   Gastrointestinal: Negative.   Endocrine: Negative.   Musculoskeletal: Negative.   Allergic/Immunologic: Positive for environmental allergies.  Neurological: Negative.   Psychiatric/Behavioral: Negative.   All other systems reviewed and are negative.      Objective:   Physical Exam  A physical exam was not performed today due to the nature of the visit (via telephone).  The patient however did not cough at any point during the conversation and did not sound breathless.      Assessment & Plan:  1.  Cough: this is due to upper airway cough syndrome related to perennial allergic rhinitis and recurrent sinusitis.  Reflux and LPR also play a factor.  Currently this symptom is under better control with management of her nasal issues.  2.  Mild intermittent reactive airways disease: We will try to decrease Flovent to 1 inhalation twice a day.  Goal will be to wean her off of this completely  3.  Perennial rhinitis: Continue nasal hygiene.  4.    Recurrent sinusitis: This issue adds complexity to her management and is likely a trigger for her symptoms.  5.  LPR: Continue PPI and antireflux measures.   Total visit time 15 minutes.  We will see the patient in follow-up in 3 months time.  This chart was dictated using voice recognition software/Dragon.  Despite best efforts to proofread, errors can occur which can change the meaning.  Any change was purely unintentional.

## 2019-01-05 NOTE — Patient Instructions (Signed)
1.  Decrease Flovent to 1 puff twice a day with spacer  2.  We will see you in follow-up in 3 months time.

## 2019-02-01 ENCOUNTER — Other Ambulatory Visit: Payer: Self-pay | Admitting: Family Medicine

## 2019-02-01 DIAGNOSIS — L814 Other melanin hyperpigmentation: Secondary | ICD-10-CM | POA: Diagnosis not present

## 2019-02-01 DIAGNOSIS — L218 Other seborrheic dermatitis: Secondary | ICD-10-CM | POA: Diagnosis not present

## 2019-02-01 DIAGNOSIS — D485 Neoplasm of uncertain behavior of skin: Secondary | ICD-10-CM | POA: Diagnosis not present

## 2019-02-01 DIAGNOSIS — Z1231 Encounter for screening mammogram for malignant neoplasm of breast: Secondary | ICD-10-CM

## 2019-03-16 ENCOUNTER — Ambulatory Visit
Admission: RE | Admit: 2019-03-16 | Discharge: 2019-03-16 | Disposition: A | Discharge status: Home / Self Care | Payer: Medicare Other | Source: Ambulatory Visit | Attending: Family Medicine | Admitting: Family Medicine

## 2019-03-16 ENCOUNTER — Other Ambulatory Visit: Payer: Self-pay

## 2019-03-16 DIAGNOSIS — Z1231 Encounter for screening mammogram for malignant neoplasm of breast: Secondary | ICD-10-CM | POA: Diagnosis not present

## 2019-03-19 ENCOUNTER — Encounter: Payer: Self-pay | Admitting: Family Medicine

## 2019-03-19 ENCOUNTER — Ambulatory Visit (INDEPENDENT_AMBULATORY_CARE_PROVIDER_SITE_OTHER): Payer: Medicare Other | Admitting: Family Medicine

## 2019-03-19 DIAGNOSIS — J4520 Mild intermittent asthma, uncomplicated: Secondary | ICD-10-CM | POA: Diagnosis not present

## 2019-03-19 DIAGNOSIS — J309 Allergic rhinitis, unspecified: Secondary | ICD-10-CM

## 2019-03-19 DIAGNOSIS — E041 Nontoxic single thyroid nodule: Secondary | ICD-10-CM | POA: Diagnosis not present

## 2019-03-19 MED ORDER — ALBUTEROL SULFATE HFA 108 (90 BASE) MCG/ACT IN AERS: 2.0000 | INHALATION_SPRAY | Freq: Four times a day (QID) | RESPIRATORY_TRACT | 2 refills | Status: DC | PRN

## 2019-03-19 NOTE — Progress Notes (Signed)
Worked up patient for her phone visit. Has seen Pulmonology since last visit. Is ok on refills.
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
## 2019-03-22 ENCOUNTER — Ambulatory Visit: Payer: Medicare Other

## 2019-03-29 ENCOUNTER — Other Ambulatory Visit: Payer: Self-pay

## 2019-03-29 ENCOUNTER — Encounter: Payer: Self-pay | Admitting: Pulmonary Disease

## 2019-03-29 ENCOUNTER — Ambulatory Visit (INDEPENDENT_AMBULATORY_CARE_PROVIDER_SITE_OTHER): Payer: Medicare Other | Admitting: Pulmonary Disease

## 2019-03-29 VITALS — BP 110/64 | HR 67 | Temp 97.3°F | Ht 66.5 in | Wt 139.6 lb

## 2019-03-29 DIAGNOSIS — K219 Gastro-esophageal reflux disease without esophagitis: Secondary | ICD-10-CM

## 2019-03-29 DIAGNOSIS — J3089 Other allergic rhinitis: Secondary | ICD-10-CM | POA: Diagnosis not present

## 2019-03-29 DIAGNOSIS — J4521 Mild intermittent asthma with (acute) exacerbation: Secondary | ICD-10-CM

## 2019-03-29 DIAGNOSIS — R059 Cough, unspecified: Secondary | ICD-10-CM

## 2019-03-29 DIAGNOSIS — J328 Other chronic sinusitis: Secondary | ICD-10-CM

## 2019-03-29 DIAGNOSIS — R05 Cough: Secondary | ICD-10-CM | POA: Diagnosis not present

## 2019-03-29 MED ORDER — DOXYCYCLINE HYCLATE 100 MG PO CAPS: 100.0000 mg | ORAL_CAPSULE | Freq: Two times a day (BID) | ORAL | 0 refills | Status: DC

## 2019-03-29 MED ORDER — ARNUITY ELLIPTA 100 MCG/ACT IN AEPB: 1.0000 | INHALATION_SPRAY | Freq: Every day | RESPIRATORY_TRACT | 6 refills | Status: DC

## 2019-03-29 NOTE — Patient Instructions (Signed)
1.  We have sent prescriptions for doxycycline to your pharmacy.  This will be 1 capsule twice a day  2.  We will switch your inhaler to Arnuity Ellipta 100 mcg 1 puff daily  3.  We will see you in follow-up in 3 to 4 weeks time call sooner with any new respiratory problems.

## 2019-03-29 NOTE — Progress Notes (Signed)
Subjective:    Patient ID: Leslie Dixon, female    DOB: 08-25-1945, 73 y.o.   MRN: [id number]  HPI Patient is 73 year old lifelong never smoker who presents for evaluation of recurrent cough.  She was initially evaluated here on 21 August 2018 and subsequently had a via telephone visit on 05 January 2019.  The patient has issues with perennial rhinitis and allergic sinusitis which flares with seasonal change.  She also is noted to have reflux with LPR.  She had actually been doing very well and had been able to control her cough with management of nasal hygiene and antireflux measures as well as PPI.  She does have airways reactivity and had been on Flovent 44 mcg twice a day.  She has however been unable to tolerate the Flovent even with a spacer and rinsing her mouth afterwards.  She has actually stopped using it to these issues.  Previously she could not use it more than once a day.  Unfortunately her insurance company does not cover Alvesco which is the most tolerable steroid to the upper airway.  In any event, she had some allergen exposure approximately 2 weeks ago and her cough has recurred.  She notices chest tightness associated with this.  No fevers, chills or sweats.  Her issues started with nasal congestion and purulent nasal drainage.  This is a common trigger for her.  She does note that her reflux symptoms are well controlled.  She has not had any hemoptysis.  She has not tried using her albuterol inhaler as she wanted to be seen first to be given the go ahead to use it.   Review of Systems  Constitutional: Negative.   HENT: Positive for congestion, postnasal drip and sinus pressure.   Eyes: Negative.   Respiratory: Positive for cough (Nonproductive).   Cardiovascular: Negative.   Gastrointestinal: Negative.   Endocrine: Negative.   Genitourinary: Negative.   Musculoskeletal: Negative.   Skin: Negative.   Allergic/Immunologic: Positive for environmental allergies.  Neurological:  Negative.   Hematological: Negative.   Psychiatric/Behavioral: Negative.   All other systems reviewed and are negative.      Objective:   Physical Exam Vitals signs reviewed.  Constitutional:      Appearance: Normal appearance. She is normal weight.  HENT:     Head: Normocephalic and atraumatic.     Right Ear: External ear normal.     Left Ear: External ear normal.     Nose:     Comments: Nose/mouth/throat not examined due to masking requirements for COVID 19.    Mouth/Throat:     Mouth: Mucous membranes are moist.     Pharynx: Oropharynx is clear.  Eyes:     General: No scleral icterus.    Extraocular Movements: Extraocular movements intact.     Conjunctiva/sclera: Conjunctivae normal.     Pupils: Pupils are equal, round, and reactive to light.  Neck:     Musculoskeletal: Normal range of motion and neck supple.  Cardiovascular:     Rate and Rhythm: Normal rate and regular rhythm.     Pulses: Normal pulses.     Heart sounds: Normal heart sounds. No murmur.  Pulmonary:     Effort: Pulmonary effort is normal.     Comments: Coarse breath sounds throughout.  Mildly prolonged end expiratory phase.  No overt wheezing Abdominal:     General: Abdomen is flat. There is no distension.     Palpations: Abdomen is soft.  Musculoskeletal: Normal range of  motion.     Right lower leg: No edema.     Left lower leg: No edema.  Skin:    General: Skin is warm and dry.  Neurological:     General: No focal deficit present.     Mental Status: She is alert and oriented to person, place, and time.  Psychiatric:        Mood and Affect: Mood normal.        Behavior: Behavior normal.           Assessment & Plan:   1. Cough: Suspect that this is related to airways reactivity/asthma.  She seems to have been triggered by a flare of her chronic sinusitis.  She also has an allergic component.  Recommend use of albuterol up to 4 times a day as needed and reinstitute inhaled corticosteroid as  below.  2. Mild intermittent asthma with acute exacerbation:  She is having difficulties complying with Flovent twice a day due to hoarseness despite rinsing mouth well.  We will try Arnuity Ellipta 1 inhalation daily to decrease the steroid exposure in the upper airway.  Recommend rinsing mouth well with baking soda solution afterwards.  Ideally she should have Alvesco as the steroid of choice given that that is less irritating to the upper airway.  However, her insurance company does not cover this medication.  Hopefully switching the fluticasone to the Arnuity formulation may help.  The patient was shown the correct use of the inhaler.  She was able to replicate the maneuvers.  3. Perennial rhinitis: Continue nasal hygiene.  4. Recurrent sinusitis with flare: This is likely a trigger of her symptoms, will treat with doxycycline 100 mg twice a day for 10 days.  5.  LPR: Continue PPI and antireflux measures.   We will see her in follow-up in 3 to 4 weeks time she is to contact us prior to that time should any new difficulties arise.   This chart was dictated using voice recognition software/Dragon.  Despite best efforts to proofread, errors can occur which can change the meaning.  Any change was purely unintentional.

## 2019-04-05 ENCOUNTER — Telehealth: Payer: Self-pay | Admitting: Pulmonary Disease

## 2019-04-05 NOTE — Telephone Encounter (Signed)
Pt is aware of below message/ recommendations and voiced her understanding.  Pt stated that she is in face rinsing with a small amount of baking soda.  Pt will call back with update after stopping arnuity.  Nothing further is needed.

## 2019-04-05 NOTE — Telephone Encounter (Signed)
Is she rinsing with small amount of baking soda in rinse water? She may hold Arnuity. I do not know about refund but can fill form. We are limited in what we can prescribe by her insurance plan.

## 2019-04-05 NOTE — Telephone Encounter (Signed)
Called and spoke to pt.  Pt was prescribed doxycycline and Arnuity at last OV. Pt symptoms improved with doxycycline after 3-4 days, however she has dry cough at times prod with light yellow mucus, sweats, voice hoariness and just not feeling well (pt is concerned that voice hoariness may be related to Rosa). She is rinsing mouth after each use. Pt is scheduled for vacation on 04/14/2019, however she does not feel that she will be able to go. Pt is questioning if Dr. Patsey Berthold will complete a insurance form, so she can get a refund.   LG please advise. Thanks

## 2019-04-09 ENCOUNTER — Ambulatory Visit: Payer: Medicare Other | Admitting: Pulmonary Disease

## 2019-04-25 ENCOUNTER — Ambulatory Visit (INDEPENDENT_AMBULATORY_CARE_PROVIDER_SITE_OTHER): Payer: Medicare Other | Admitting: Pulmonary Disease

## 2019-04-25 ENCOUNTER — Other Ambulatory Visit: Payer: Self-pay

## 2019-04-25 ENCOUNTER — Encounter: Payer: Self-pay | Admitting: Pulmonary Disease

## 2019-04-25 VITALS — BP 124/74 | HR 83 | Temp 98.0°F | Ht 66.5 in | Wt 142.4 lb

## 2019-04-25 DIAGNOSIS — R05 Cough: Secondary | ICD-10-CM | POA: Diagnosis not present

## 2019-04-25 DIAGNOSIS — K219 Gastro-esophageal reflux disease without esophagitis: Secondary | ICD-10-CM

## 2019-04-25 DIAGNOSIS — Z0000 Encounter for general adult medical examination without abnormal findings: Secondary | ICD-10-CM

## 2019-04-25 DIAGNOSIS — Z23 Encounter for immunization: Secondary | ICD-10-CM | POA: Diagnosis not present

## 2019-04-25 DIAGNOSIS — J3089 Other allergic rhinitis: Secondary | ICD-10-CM | POA: Diagnosis not present

## 2019-04-25 DIAGNOSIS — R059 Cough, unspecified: Secondary | ICD-10-CM

## 2019-04-25 DIAGNOSIS — J4521 Mild intermittent asthma with (acute) exacerbation: Secondary | ICD-10-CM | POA: Diagnosis not present

## 2019-04-25 MED ORDER — AZELASTINE HCL 0.1 % NA SOLN: 1.0000 | Freq: Two times a day (BID) | NASAL | 2 refills | Status: DC

## 2019-04-25 NOTE — Progress Notes (Signed)
Subjective:    Patient ID: Leslie Dixon, female    DOB: 1945/10/23, 73 y.o.   MRN: [id number]  HPI Patient is 73 year old lifelong never smoker who presents for follow-up of recurrent cough.  She was initially evaluated here on 21 August 2018 and subsequently had a via telephone visit on 05 January 2019.  We then saw her again on 3 September with recurrent symptoms. The patient has issues with perennial rhinitis and allergic sinusitis which flares with seasonal change.  She also is noted to have reflux with LPR.  She had actually been doing very well and had been able to control her cough with management of nasal hygiene and antireflux measures as well as PPI.  On her September visit she noted that she had had allergen exposure 2 weeks prior.  This triggered a cough again.  She has been given a trial of Arnuity Ellipta since that visit.  She has noted no fevers, chills or sweats.  She notes that her cough is improved but still present.  As noted a lot of her cough is driven by nasal symptoms.  She is on several nasal sprays that appear to make things somewhat worse for her time around.  She uses Claritin for allergy but it does not appear that this is helping.  She voices no other complaint today.  Her reflux symptoms remain controlled.  Patient will need flu vaccine today.  She also notes that she needs primary care referral.   Review of Systems  Constitutional: Negative.   HENT: Positive for congestion, postnasal drip and sinus pressure.   Eyes: Negative.   Respiratory: Positive for cough (Nonproductive).   Cardiovascular: Negative.   Gastrointestinal: Negative.   Endocrine: Negative.   Genitourinary: Negative.   Musculoskeletal: Negative.   Skin: Negative.   Allergic/Immunologic: Positive for environmental allergies.  Neurological: Negative.   Hematological: Negative.   Psychiatric/Behavioral: Negative.   All other systems reviewed and are negative. No significant change from prior  ROS.     Objective:   Physical Exam Vitals signs and nursing note reviewed.  Constitutional:      Appearance: Normal appearance. She is normal weight.  HENT:     Head: Normocephalic and atraumatic.     Right Ear: External ear normal.     Left Ear: External ear normal.     Nose:     Comments: Nose/mouth/throat not examined due to masking requirements for COVID 19. Eyes:     General: No scleral icterus.    Extraocular Movements: Extraocular movements intact.     Conjunctiva/sclera: Conjunctivae normal.     Pupils: Pupils are equal, round, and reactive to light.  Neck:     Musculoskeletal: Normal range of motion and neck supple.  Cardiovascular:     Rate and Rhythm: Normal rate and regular rhythm.     Pulses: Normal pulses.     Heart sounds: Normal heart sounds. No murmur.  Pulmonary:     Effort: Pulmonary effort is normal.     Comments: Coarse breath sounds throughout.  Mildly prolonged end expiratory phase.  No overt wheezing Abdominal:     General: Abdomen is flat. There is no distension.     Palpations: Abdomen is soft.  Musculoskeletal: Normal range of motion.     Right lower leg: No edema.     Left lower leg: No edema.  Skin:    General: Skin is warm and dry.  Neurological:     General: No focal deficit present.  Mental Status: She is alert and oriented to person, place, and time.  Psychiatric:        Mood and Affect: Mood normal.        Behavior: Behavior normal.           Assessment & Plan:  1. Cough, improved: Suspect that this is related to airways reactivity/asthma.  She seems to have been triggered by a flare of her chronic sinusitis.  She also has an allergic component.  Continue albuterol as needed, continue Arnuity Ellipta.  We will see her in follow-up in 2 months time, she is to contact us prior to that time should any new difficulties arise.  2. Mild intermittent asthma with acute exacerbation:  She is tolerating Arnuity Ellipta not requiring  use of albuterol too much.  Continue Arnuity.  3. Perennial allergic rhinitis with seasonal variation:Continue nasal hygiene.  Nasal inhalers have stopped working for her as previous, will switch Atrovent and Nasacort to Azelastine 1 spray to each nostril twice a day.  For management of her allergies will discontinue Claritin and switch to Zyrtec 1 tablet at bedtime.  4. [id number] PPI and antireflux measures.  5.  Need for flu vaccine: She had high-dose flu vaccine given today.  6.  Need for health maintenance: Referral to primary care.   This chart was dictated using voice recognition software/Dragon.  Despite best efforts to proofread, errors can occur which can change the meaning.  Any change was purely unintentional.

## 2019-04-25 NOTE — Patient Instructions (Addendum)
1.  You received your flu shot today.  2.  Discontinue Claritin and start Zyrtec (cetirizine) 1 tablet daily  3.  Discontinue Atrovent nasal inhaler and put Nasacort nasal inhaler on hold for now.  4.  We will give you a trial of azelastine nasal inhaler 1 spray twice a day to each nostril.  5.  Will refer you to primary care.  6.  We will see you in follow-up in 2 months time.

## 2019-04-27 ENCOUNTER — Telehealth: Payer: Self-pay | Admitting: Pulmonary Disease

## 2019-04-27 MED ORDER — ARNUITY ELLIPTA 100 MCG/ACT IN AEPB: 1.0000 | INHALATION_SPRAY | Freq: Every day | RESPIRATORY_TRACT | 1 refills | Status: DC

## 2019-04-27 NOTE — Telephone Encounter (Signed)
Called and spoke to pt, who is requesting Arnuity 100mg . Rx has been sent to preferred pharmacy. Pt is requesting OV notes. Pt is aware that OV note from 9/30 is not completed.  Pt will call back next week for update on ov note.  Nothing further is needed at this time.

## 2019-05-03 ENCOUNTER — Telehealth: Payer: Self-pay | Admitting: Pulmonary Disease

## 2019-05-03 NOTE — Telephone Encounter (Signed)
Called and spoke to pt, who is requesting 03/29/2019 and 04/25/2019 office notes.  04/25/2019 note is not complete as of yet. Pt states notes are needed for insurance.  LG please advise. thanks

## 2019-05-04 ENCOUNTER — Encounter: Payer: Self-pay | Admitting: Pulmonary Disease

## 2019-05-04 NOTE — Telephone Encounter (Signed)
Notes have been completed and placed in outgoing mail per pt request. Pt aware and voiced her understanding. Nothing further is needed.

## 2019-05-14 DIAGNOSIS — T22031A Burn of unspecified degree of right upper arm, initial encounter: Secondary | ICD-10-CM | POA: Diagnosis not present

## 2019-05-21 ENCOUNTER — Ambulatory Visit: Payer: Medicare Other | Admitting: Family Medicine

## 2019-05-29 ENCOUNTER — Encounter: Payer: Self-pay | Admitting: Family Medicine

## 2019-05-29 ENCOUNTER — Ambulatory Visit: Payer: 59 | Admitting: Endocrinology

## 2019-05-29 ENCOUNTER — Ambulatory Visit (INDEPENDENT_AMBULATORY_CARE_PROVIDER_SITE_OTHER): Payer: Medicare Other | Admitting: Family Medicine

## 2019-05-29 ENCOUNTER — Other Ambulatory Visit: Payer: Self-pay

## 2019-05-29 VITALS — BP 104/56 | HR 75 | Temp 98.4°F | Ht 66.25 in | Wt 138.8 lb

## 2019-05-29 DIAGNOSIS — S298XXA Other specified injuries of thorax, initial encounter: Secondary | ICD-10-CM | POA: Insufficient documentation

## 2019-05-29 DIAGNOSIS — J0100 Acute maxillary sinusitis, unspecified: Secondary | ICD-10-CM | POA: Diagnosis not present

## 2019-05-29 DIAGNOSIS — M810 Age-related osteoporosis without current pathological fracture: Secondary | ICD-10-CM

## 2019-05-29 DIAGNOSIS — S20211A Contusion of right front wall of thorax, initial encounter: Secondary | ICD-10-CM | POA: Diagnosis not present

## 2019-05-29 DIAGNOSIS — E041 Nontoxic single thyroid nodule: Secondary | ICD-10-CM | POA: Diagnosis not present

## 2019-05-29 DIAGNOSIS — J4520 Mild intermittent asthma, uncomplicated: Secondary | ICD-10-CM | POA: Diagnosis not present

## 2019-05-29 DIAGNOSIS — K219 Gastro-esophageal reflux disease without esophagitis: Secondary | ICD-10-CM

## 2019-05-29 NOTE — Assessment & Plan Note (Signed)
Fracture vs contusion. Advised NSAID and let me know if not able to take deep breaths and will do stronger medication if needed. Discussed healing 4-8 weeks.

## 2019-05-29 NOTE — Patient Instructions (Signed)
Treatment Take Meloxicam daily Can take up to 2 weeks If having difficulty taking deep breaths due to pain then let me know and we can think about other pain medicaitons   Rib Contusion A rib contusion is a deep bruise on your rib area. Contusions are the result of a blunt trauma that causes bleeding and injury to the tissues under the skin. A rib contusion may involve bruising of the ribs and of the skin and muscles in the area. The skin over the contusion may turn blue, purple, or yellow. Minor injuries will give you a painless contusion. More severe contusions may stay painful and swollen for a few weeks. What are the causes? This condition is usually caused by a blow, trauma, or direct force to an area of the body. This often occurs while playing contact sports. What are the signs or symptoms? Symptoms of this condition include:  Swelling and redness of the injured area.  Discoloration of the injured area.  Tenderness and soreness of the injured area.  Pain with or without movement. How is this diagnosed? This condition may be diagnosed based on:  Your symptoms and medical history.  A physical exam.  Imaging tests-such as an X-ray, CT scan, or MRI-to determine if there were internal injuries or broken bones (fractures). How is this treated? This condition may be treated with:  Rest. This is often the best treatment for a rib contusion.  Icing. This reduces swelling and inflammation.  Deep-breathing exercises. These may be recommended to reduce the risk for lung collapse and pneumonia.  Medicines. Over-the-counter or prescription medicines may be given to control pain.  Injection of a numbing medicine around the nerve near your injury (nerve block). Follow these instructions at home:     Medicines  Take over-the-counter and prescription medicines only as told by your health care provider.  Do not drive or use heavy machinery while taking prescription pain medicine.   If you are taking prescription pain medicine, take actions to prevent or treat constipation. Your health care provider may recommend that you: ? Drink enough fluid to keep your urine pale yellow. ? Eat foods that are high in fiber, such as fresh fruits and vegetables, whole grains, and beans. ? Limit foods that are high in fat and processed sugars, such as fried or sweet foods. ? Take an over-the-counter or prescription medicine for constipation. Managing pain, stiffness, and swelling  If directed, put ice on the injured area: ? Put ice in a plastic bag. ? Place a towel between your skin and the bag. ? Leave the ice on for 20 minutes, 2-3 times a day.  Rest the injured area. Avoid strenuous activity and any activities or movements that cause pain. Be careful during activities and avoid bumping the injured area.  Do not lift anything that is heavier than 5 lb (2.3 kg), or the limit that you are told, until your health care provider says that it is safe. General instructions  Do not use any products that contain nicotine or tobacco, such as cigarettes and e-cigarettes. These can delay healing. If you need help quitting, ask your health care provider.  Do deep-breathing exercises as told by your health care provider.  If you were given an incentive spirometer, use it every 1-2 hours while you are awake, or as recommended by your health care provider. This device measures how well you are filling your lungs with each breath.  Keep all follow-up visits as told by your health care  provider. This is important. Contact a health care provider if you have:  Increased bruising or swelling.  Pain that is not controlled with treatment.  A fever. Get help right away if you:  Have difficulty breathing or shortness of breath.  Develop a continual cough or you cough up thick or bloody sputum.  Feel nauseous or you vomit.  Have pain in your abdomen. Summary  A rib contusion is a deep  bruise on your rib area. Contusions are the result of a blunt trauma that causes bleeding and injury to the tissues under the skin.  The skin overlying the contusion may turn blue, purple, or yellow. Minor injuries may give you a painless contusion. More severe contusions may stay painful and swollen for a few weeks.  Rest the injured area. Avoid strenuous activity and any activities or movements that cause pain. This information is not intended to replace advice given to you by your health care provider. Make sure you discuss any questions you have with your health care provider. Document Released: 04/06/2001 Document Revised: 08/10/2017 Document Reviewed: 08/10/2017 Elsevier Patient Education  2020 [employer].

## 2019-05-29 NOTE — Assessment & Plan Note (Signed)
Discussed risk of PPI worsening osteoporosis. Pt will consider decreasing use

## 2019-05-29 NOTE — Progress Notes (Signed)
Subjective:     Leslie Dixon is a 73 y.o. female presenting for Establish Care (previous PCP Neoma Laming Opalski-notes in epic) and Chest Pain (injured rib ara on the right side on 05/25/2019. Tried ice and heat. )     HPI   #asthma - recently started controller - followed by pulmonology - doing better  #osteoarthritis - occasional meloxicam  #Rib pain - leaned over last week on 10/30 - felt a pop - worse with deep breath - has not taken medication - coughing makes it worse  #Osteoporosis - was on boneva for years - no change and stopped this treatment   Review of Systems  Constitutional: Negative for chills and fever.  HENT: Negative.   Eyes: Negative.   Respiratory: Negative for chest tightness and shortness of breath.   Cardiovascular: Positive for chest pain (rib).  Gastrointestinal: Negative.   Endocrine: Positive for cold intolerance. Negative for heat intolerance.  Genitourinary: Negative.   Musculoskeletal: Negative.   Skin: Negative.   Allergic/Immunologic: Positive for environmental allergies.  Neurological: Negative.   Hematological: Negative.   Psychiatric/Behavioral: Negative.    I have reviewed the patients PMH, PSH, FmHx, Social Hx, medications and allergies and they are updated in Epic.    Social History   Tobacco Use  Smoking Status Never Smoker  Smokeless Tobacco Never Used        Objective:    BP Readings from Last 3 Encounters:  05/29/19 (!) 104/56  04/25/19 124/74  03/29/19 110/64   Wt Readings from Last 3 Encounters:  05/29/19 138 lb 12 oz (62.9 kg)  04/25/19 142 lb 6.4 oz (64.6 kg)  03/29/19 139 lb 9.6 oz (63.3 kg)    BP (!) 104/56   Pulse 75   Temp 98.4 F (36.9 C)   Ht 5' 6.25" (1.683 m)   Wt 138 lb 12 oz (62.9 kg)   SpO2 98%   BMI 22.23 kg/m    Physical Exam Constitutional:      General: She is not in acute distress.    Appearance: She is well-developed. She is not diaphoretic.  HENT:     Right Ear:  External ear normal.     Left Ear: External ear normal.     Nose: Nose normal.  Eyes:     Conjunctiva/sclera: Conjunctivae normal.  Neck:     Musculoskeletal: Neck supple.  Cardiovascular:     Rate and Rhythm: Normal rate and regular rhythm.     Heart sounds: Normal heart sounds. No murmur.  Pulmonary:     Effort: Pulmonary effort is normal. No tachypnea or respiratory distress.     Breath sounds: Normal breath sounds.  Chest:     Chest wall: Tenderness (on the right lower ribs) present.  Skin:    General: Skin is warm and dry.     Capillary Refill: Capillary refill takes less than 2 seconds.     Comments: No bruising present  Neurological:     Mental Status: She is alert. Mental status is at baseline.  Psychiatric:        Mood and Affect: Mood normal.        Behavior: Behavior normal.           Assessment & Plan:   Problem List Items Addressed This Visit      Respiratory   Mild intermittent asthma without complication - Primary    Stable on exam. Recently started daily medication. Sees pulmonology        Digestive  Gastroesophageal reflux disease    Discussed risk of PPI worsening osteoporosis. Pt will consider decreasing use        Endocrine   Right thyroid nodule    Reviewed imaging and specialty note from 1 year ago. Normal labs in 08/2018. Will plan to continue to monitor with labs this spring        Musculoskeletal and Integument   Osteoporosis (Chronic)    Pt has already been on treatment. Encouraged Vit D and Ca and weight bearing exercise. Also discussed trying to reduce use of PPI.       Rib contusion, right, initial encounter    Fracture vs contusion. Advised NSAID and let me know if not able to take deep breaths and will do stronger medication if needed. Discussed healing 4-8 weeks.           Return in about 6 months (around 11/26/2019).  Lesleigh Noe, MD

## 2019-05-29 NOTE — Assessment & Plan Note (Signed)
Stable on exam. Recently started daily medication. Sees pulmonology

## 2019-05-29 NOTE — Assessment & Plan Note (Signed)
Pt has already been on treatment. Encouraged Vit D and Ca and weight bearing exercise. Also discussed trying to reduce use of PPI.

## 2019-05-29 NOTE — Assessment & Plan Note (Signed)
Reviewed imaging and specialty note from 1 year ago. Normal labs in 08/2018. Will plan to continue to monitor with labs this spring

## 2019-06-04 DIAGNOSIS — H0102A Squamous blepharitis right eye, upper and lower eyelids: Secondary | ICD-10-CM | POA: Diagnosis not present

## 2019-06-04 DIAGNOSIS — H04123 Dry eye syndrome of bilateral lacrimal glands: Secondary | ICD-10-CM | POA: Diagnosis not present

## 2019-06-04 DIAGNOSIS — H0102B Squamous blepharitis left eye, upper and lower eyelids: Secondary | ICD-10-CM | POA: Diagnosis not present

## 2019-06-18 ENCOUNTER — Ambulatory Visit (INDEPENDENT_AMBULATORY_CARE_PROVIDER_SITE_OTHER): Payer: Medicare Other | Admitting: Pulmonary Disease

## 2019-06-18 ENCOUNTER — Other Ambulatory Visit: Payer: Self-pay

## 2019-06-18 ENCOUNTER — Encounter: Payer: Self-pay | Admitting: Pulmonary Disease

## 2019-06-18 VITALS — BP 122/70 | HR 78 | Temp 97.8°F | Ht 66.5 in | Wt 142.2 lb

## 2019-06-18 DIAGNOSIS — J4521 Mild intermittent asthma with (acute) exacerbation: Secondary | ICD-10-CM

## 2019-06-18 DIAGNOSIS — K219 Gastro-esophageal reflux disease without esophagitis: Secondary | ICD-10-CM

## 2019-06-18 DIAGNOSIS — R05 Cough: Secondary | ICD-10-CM | POA: Diagnosis not present

## 2019-06-18 DIAGNOSIS — R059 Cough, unspecified: Secondary | ICD-10-CM

## 2019-06-18 DIAGNOSIS — J3089 Other allergic rhinitis: Secondary | ICD-10-CM

## 2019-06-18 NOTE — Patient Instructions (Signed)
We will see you back in office in 6 months. Call sooner if needed.

## 2019-06-18 NOTE — Progress Notes (Signed)
Subjective:    Patient ID: Leslie Dixon, female    DOB: Dec 20, 1945, 73 y.o.   MRN: [id number]  HPI Patient is 73 year old lifelong never smoker who presents for follow-up of recurrent cough. She was initially evaluated here on 21 August 2018.    She was seen again on 3 September with recurrent symptoms and exacerbation of asthma follow-up on 30 September show that the symptoms that resolved.The patient has issues with perennial rhinitis and allergic sinusitis which flares with seasonal change.  She has issues with chronic sinusitis for which she sees Dr. Pryor Ochoa. Recently she had to have another round of Augmentin for management of a flare of sinusitis.  She also is noted to have reflux with LPR. She had actually been doing very well and had been able to control her cough with management of nasal hygiene and antireflux measures as well as PPI.   Since her late September visit she has not had any issues with the cough.  He is on Arnuity Ellipta 100 mcg daily. She has noted no fevers, chills or sweats.  She notes that her cough is acutely improved and now rarely occurs at all.  As noted a lot of her cough is driven by nasal symptoms. She uses Claritin for allergy but it does not appear that this is helping.  She voices no other complaint today. Her reflux symptoms remain controlled.   Review of Systems A 10 point review of systems was performed and it is as noted above otherwise negative.  Short    Objective:   Physical Exam Vitals signs and nursing note reviewed.  Constitutional:      Appearance: Normal appearance. She is normal weight.  HENT:     Head: Normocephalic and atraumatic.     Right Ear: External ear normal.     Left Ear: External ear normal.     Nose:     Comments: Nose/mouth/throat not examined due to masking requirements for COVID 19. Eyes:     General: No scleral icterus.    Extraocular Movements: Extraocular movements intact.     Conjunctiva/sclera: Conjunctivae normal.      Pupils: Pupils are equal, round, and reactive to light.  Neck:     Musculoskeletal: Normal range of motion and neck supple.  Cardiovascular:     Rate and Rhythm: Normal rate and regular rhythm.     Pulses: Normal pulses.     Heart sounds: Normal heart sounds. No murmur.  Pulmonary:     Effort: Pulmonary effort is normal.     Breath sounds: Normal breath sounds and air entry.  Abdominal:     General: Abdomen is flat. There is no distension.     Palpations: Abdomen is soft.  Musculoskeletal: Normal range of motion.     Right lower leg: No edema.     Left lower leg: No edema.  Skin:    General: Skin is warm and dry.  Neurological:     General: No focal deficit present.     Mental Status: She is alert and oriented to person, place, and time.  Psychiatric:        Mood and Affect: Mood normal.        Behavior: Behavior normal.    BP 122/70 (BP Location: Left Arm, Cuff Size: Normal)   Pulse 78   Temp 97.8 F (36.6 C) (Oral)   Ht 5' 6.5" (1.689 m)   Wt 142 lb 3.2 oz (64.5 kg)   SpO2 97%  BMI 22.61 kg/m         Assessment & Plan:  1. Cough, resolved:Suspect that this is related to airways reactivity/asthma. Continue albuterol as needed, continue Arnuity Ellipta.    She has also been advised to continue allergen avoidance.  We will see her in follow-up in 6 months time, she is to contact us prior to that time should any new difficulties arise.  2. Mild intermittentasthma with acute exacerbation: She is tolerating Arnuity Ellipta not requiring use of albuterol.  Continue Arnuity.  3. Perennial allergic rhinitis with seasonal variation/chronic sinusitis:Continue nasal hygiene.   Continue follow-up with Dr. Pryor Ochoa in this regard.  4. [id number] PPI and antireflux measures.   Renold Don, MD [facility]   This note was dictated using voice recognition software/Dragon.  Despite best efforts to proofread, errors can occur which can change the meaning.   Any change was purely unintentional.

## 2019-06-18 NOTE — Progress Notes (Deleted)
   Subjective:    Patient ID: Leslie Dixon, female    DOB: 03/22/1946, 73 y.o.   MRN: [id number]  HPI    Review of Systems     Objective:   Physical Exam        Assessment & Plan:

## 2019-06-27 ENCOUNTER — Telehealth: Payer: Self-pay | Admitting: Family Medicine

## 2019-06-27 DIAGNOSIS — M5412 Radiculopathy, cervical region: Secondary | ICD-10-CM

## 2019-06-27 NOTE — Telephone Encounter (Signed)
Patient states she takes Meloxicam 7.5 mg 1 tablet daily as needed-not daily, takes it for pain in her neck -about 3 to 5 days a week maybe. Patient states she has had infusions in her neck and has pain sometimes from all the surgeries. Previous medications tried for this: Tylenol-did not work for the pain, Ibuprofen, Aleve-did not settle well on stomach, she has tried Tramadol before also but likes Meloxicam better. Patient states about once a year she has an ortho that does injections for the pain but she has done well this year and has not had to get any so far.  Patient did say if she needs to do a phone visit or something with Dr Einar Pheasant to just let her know and she will do what PCP says.    (per our chart it says 15 mg but patient states she is not taking that dose)

## 2019-06-27 NOTE — Telephone Encounter (Signed)
Patient called and said she has been taking expired Meloxicam 7.5 mg.  Patient wants to know if a rx can be called in to [facility].

## 2019-06-28 MED ORDER — MELOXICAM 7.5 MG PO TABS: 7.5000 mg | ORAL_TABLET | Freq: Every day | ORAL | 1 refills | Status: DC | PRN

## 2019-06-28 NOTE — Telephone Encounter (Signed)
[facility].  She states that her insurance usually only pay for one fill at local pharmacy.  I advised her that is usually for maintenance meds that taken every day.  She stated that she takes meloxicam most of the days. Are you ok with [place] sending [facility].

## 2019-06-28 NOTE — Addendum Note (Signed)
Addended by: Lesleigh Noe on: 06/28/2019 10:19 AM   Modules accepted: Orders

## 2019-06-28 NOTE — Telephone Encounter (Signed)
Sent to pharmacy with 7.5 mg dose

## 2019-06-28 NOTE — Telephone Encounter (Signed)
Sent to pharmacy 

## 2019-08-20 DIAGNOSIS — M79631 Pain in right forearm: Secondary | ICD-10-CM | POA: Diagnosis not present

## 2019-08-20 DIAGNOSIS — M25521 Pain in right elbow: Secondary | ICD-10-CM | POA: Diagnosis not present

## 2019-09-15 ENCOUNTER — Other Ambulatory Visit: Payer: Self-pay | Admitting: Pulmonary Disease

## 2019-10-11 ENCOUNTER — Ambulatory Visit (INDEPENDENT_AMBULATORY_CARE_PROVIDER_SITE_OTHER): Payer: Medicare Other

## 2019-10-11 ENCOUNTER — Other Ambulatory Visit: Payer: Self-pay

## 2019-10-11 VITALS — BP 110/56 | Wt 132.0 lb

## 2019-10-11 DIAGNOSIS — Z0000 Encounter for general adult medical examination without abnormal findings: Secondary | ICD-10-CM

## 2019-10-11 NOTE — Progress Notes (Signed)
Subjective:   Leslie Dixon is a 74 y.o. female who presents for Medicare Annual (Subsequent) preventive examination.  Review of Systems: N/A   This visit is being conducted through telemedicine via telephone at the nurse health advisor's home address due to the COVID-19 pandemic. This patient has given me verbal consent via doximity to conduct this visit, patient states they are participating from their home address. Patient and myself are on the telephone call. There is no referral for this visit. Some vital signs may be absent or patient reported.    Patient identification: identified by name, DOB, and current address   Cardiac Risk Factors include: advanced age (>21men, >20 women)     Objective:     Vitals: BP (!) 110/56   Wt 132 lb (59.9 kg)   BMI 20.99 kg/m   Body mass index is 20.99 kg/m.  Advanced Directives 10/11/2019 02/08/2017 02/12/2016 02/12/2016 01/02/2015 04/05/2014 01/01/2014  Does Patient Have a Medical Advance Directive? Yes Yes Yes Yes No Yes Patient has advance directive, copy in chart  Type of Advance Directive [place];Living will [place];Living will;Out of facility DNR (pink MOST or yellow form) [place];Living will;Out of facility DNR (pink MOST or yellow form) [place];Living will - [place];Living will Living will  Does patient want to make changes to medical advance directive? - - No - Patient declined No - Patient declined - - -  Copy of [place] in Chart? No - copy requested - No - copy requested - - (No Data) -  Would patient like information on creating a medical advance directive? - - - - No - patient declined information - -    Tobacco Social History   Tobacco Use  Smoking Status Never Smoker  Smokeless Tobacco Never Used     Counseling given: Not Answered   Clinical Intake:  Pre-visit preparation completed: Yes  Pain :  No/denies pain     Nutritional Risks: None Diabetes: No  How often do you need to have someone help you when you read instructions, pamphlets, or other written materials from your doctor or pharmacy?: 1 - Never What is the last grade level you completed in school?: some college  Interpreter Needed?: No  Information entered by :: CJohnson, LPN  Past Medical History:  Diagnosis Date  . Allergy   . Arthritis   . Blood dyscrasia    LEUKOPENIA- DR. CHISM - NO TREATMENT BUT IS MONITORED  . Chronic back pain   . Chronic back pain   . Fibromyalgia   . Foot drop, right    RELATED TO LUMBAR PROBLEMS  . GERD (gastroesophageal reflux disease)    PRILOSEC PRN  . History of kidney stones   . Horseshoe kidney    BILATERAL  . Leukopenia   . Migraine    OCCULAR MIGRAINES - AURA WITH EYES  . Osteoporosis   . Pain    LUMBAR PAIN - RECENT FALL BECAUSE LEGS GAVE OUT - PT HAS HAD LUMBAR INJECTION SINCE THE FALL THAT HAS HELPED BACK PAIN  . Pain    CHRONIC RIGHT UPPER QUADRANT PAIN - RELATED TO GALLBLADDER PROBLEM  . Sciatica of right side   . Sinusitis    f/u with Dr. Janace Hoard.   . Vertigo    Past Surgical History:  Procedure Laterality Date  . ABDOMINAL HYSTERECTOMY  1983  . APPENDECTOMY  1968  . BILATERAL SALPINGOOPHORECTOMY  2000  .  BREAST EXCISIONAL BIOPSY Right 1979  . CHOLECYSTECTOMY N/A 04/12/2014   Procedure: LAPAROSCOPIC CHOLECYSTECTOMY WITH INTRAOPERATIVE CHOLANGIOGRAM;  Surgeon: Kaylyn Lim, MD;  Location: [facility];  Service: General;  Laterality: N/A;  . Lumbar/cervical surgeries     CERVICAL FUSION C7-C6 WITH BONE GRAFT; C6-C5 WITH PLATE AND 4 SCREWS;  3 LUMBAR SURGERIES BUT NO FUSION  . NASAL SINUS SURGERY  2010  . right knee     ARTHROSCOPY  . TONSILLECTOMY    . TYMPANOSTOMY TUBE PLACEMENT  07/2017  . WISDOM TOOTH PULLED     Family History  Problem Relation Age of Onset  . Hypertension Father   . Hyperlipidemia Father   . Alcohol abuse Father   . Lung cancer Father    . Neurodegenerative disease Mother   . Arthritis Mother   . Asthma Brother   . Prostate cancer Brother   . Breast cancer Maternal Aunt 66  . Leukemia Maternal Grandfather   . Breast cancer Sister 11  . Thyroid disease Daughter   . Asthma Son   . Asthma Sister   . Migraines Sister    Social History   Socioeconomic History  . Marital status: Married    Spouse name: Morris  . Number of children: 2  . Years of education: College  . Highest education level: Not on file  Occupational History    Comment: retired Press photographer  Tobacco Use  . Smoking status: Never Smoker  . Smokeless tobacco: Never Used  Substance and Sexual Activity  . Alcohol use: No  . Drug use: No  . Sexual activity: Not Currently    Birth control/protection: Surgical  Other Topics Concern  . 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 Difficulty of Paying Living Expenses: Not hard at all  Food Insecurity: No Food Insecurity  . Worried About Charity fundraiser in the Last Year: Never true  . Ran Out of Food in the Last Year: Never true  Transportation Needs: No Transportation Needs  . Lack of Transportation (Medical): No  . Lack of Transportation (Non-Medical): No  Physical Activity: Sufficiently Active  . Days of Exercise per Week: 7 days  . Minutes of Exercise per  Session: 30 min  Stress: No Stress Concern Present  . Feeling of Stress : Not at all  Social Connections:   . Frequency of Communication with Friends and Family:   . Frequency of Social Gatherings with Friends and Family:   . Attends Religious Services:   . Active Member of Clubs or Organizations:   . Attends Archivist Meetings:   Marland Kitchen Marital Status:     Outpatient Encounter Medications as of 10/11/2019  Medication Sig  .  albuterol (VENTOLIN HFA) 108 (90 Base) MCG/ACT inhaler Inhale 2 puffs into the lungs every 6 (six) hours as needed for wheezing or shortness of breath.  . Fluticasone Furoate (ARNUITY ELLIPTA) 100 MCG/ACT AEPB Inhale 1 puff into the lungs daily.  Marland Kitchen ipratropium (ATROVENT) 0.03 % nasal spray Place 2 sprays into both nostrils every 12 (twelve) hours.  Marland Kitchen loratadine (CLARITIN) 10 MG tablet Take 10 mg by mouth daily.  . meloxicam (MOBIC) 7.5 MG tablet Take 1 tablet (7.5 mg total) by mouth daily as needed for pain.  . SUMAtriptan (IMITREX) 100 MG tablet TAKE 1 2 (ONE HALF) TABLET BY MOUTH AS NEEDED FOR MIGRAINE  . TURMERIC PO Take 1 tablet by mouth daily at 8 pm.   . vitamin B-12 (CYANOCOBALAMIN) 1000 MCG tablet Take 1,000 mcg by mouth daily.  . Vitamin D, Cholecalciferol, 1000 units CAPS Take 1 capsule by mouth daily.   . vitamin E 400 UNIT capsule Take 400 Units by mouth daily. Patient takes 2 a day  . omeprazole (PRILOSEC) 20 MG capsule Take 1 capsule (20 mg total) by mouth 2 (two) times daily.   No facility-administered encounter medications on file as of 10/11/2019.    Activities of Daily Living In your present state of health, do you have any difficulty performing the following activities: 10/11/2019  Hearing? Y  Comment right ear hearing loss  Vision? N  Difficulty concentrating or making decisions? N  Walking or climbing stairs? N  Dressing or bathing? N  Doing errands, shopping? N  Preparing Food and eating ? N  Using the Toilet? N  In the past six  months, have you accidently leaked urine? N  Do you have problems with loss of bowel control? N  Managing your Medications? N  Managing your Finances? N  Housekeeping or managing your Housekeeping? N  Some recent data might be hidden    Patient Care Team: Lesleigh Noe, MD as PCP - General (Family Medicine) Heath Lark, MD as Consulting Physician (Hematology and Oncology) Suella Broad, MD as Consulting Physician (Physical Medicine and Rehabilitation) Myrlene Broker, MD as Attending Physician (Urology) Tyler Pita, MD as Referring Physician (Physical Medicine and Rehabilitation) Katy Apo, MD as Consulting Physician (Ophthalmology) Juanda Chance, NP as Nurse Practitioner (Obstetrics and Gynecology) Druscilla Brownie, MD as Consulting Physician (Dermatology) Carloyn Manner, MD as Referring Physician (Otolaryngology) Renato Shin, MD as Consulting Physician (Endocrinology) Katy Apo, MD as Consulting Physician (Ophthalmology) Laurence Spates, MD (Inactive) as Consulting Physician (Gastroenterology)    Assessment:   This is a routine wellness examination for Rebeccah.  Exercise Activities and Dietary recommendations Current Exercise Habits: The patient does not participate in regular exercise at present, Exercise limited by: None identified  Goals    . Patient Stated     10/11/2019, I will continue to ride my stationary bike everyday for 1-2 miles.        Fall Risk Fall Risk  10/11/2019 09/29/2017 04/22/2017 04/20/2016  Falls in the past year? 0 No No No  Number falls in past yr: 0 - - -  Injury with Fall? 0 - - -  Risk for fall due to : Medication side effect - - -  Follow up Falls prevention discussed;Falls evaluation completed - - -   Is the patient's home free of loose throw rugs in walkways, pet beds, electrical cords, etc?   yes      Grab bars in the bathroom? yes      Handrails on the stairs?  yes      Adequate lighting?   yes  Timed Get Up and Go  performed: N/A  Depression Screen PHQ 2/9 Scores 10/11/2019 07/27/2018 05/31/2018 04/25/2018  PHQ - 2 Score 0 0 0 0  PHQ- 9 Score 0 0 0 0     Cognitive Function MMSE - Mini Mental State Exam 10/11/2019  Orientation to time 5  Orientation to Place 5  Registration 3  Attention/ Calculation 5  Recall 3  Language- repeat 1  Mini Cog  Mini-Cog screen was completed. Maximum score is 22. A value of 0 denotes this part of the MMSE was not completed or the patient failed this part of the Mini-Cog screening.    6CIT Screen 04/22/2017  What Year? 0 points  What month? 0 points  What time? 0 points  Count back from 20 0 points  Months in reverse 0 points  Repeat phrase 4 points  Total Score 4     Immunization History  Administered Date(s) Administered  . Fluad Quad(high Dose 65+) 04/25/2019  . Influenza Whole 04/30/2004, 04/26/2007  . Influenza, High Dose Seasonal PF 05/13/2016, 05/10/2017, 04/25/2018  . Influenza,inj,quad, With Preservative 04/25/2017  . PFIZER SARS-COV-2 Vaccination 08/13/2019  . Pneumococcal Conjugate-13 04/20/2016  . Pneumococcal Polysaccharide-23 04/30/2004, 07/27/2010, 04/20/2016  . Td 07/26/2002, 11/19/2003  . Tdap 04/20/2016  . Zoster 07/27/2007  . Zoster Recombinat (Shingrix) 04/25/2018, 09/11/2018    Qualifies for Shingles Vaccine: Completed series  Screening Tests Health Maintenance  Topic Date Due  . PNA vac Low Risk Adult (2 of 2 - PCV13) 04/20/2017  . COLONOSCOPY  10/11/2023 (Originally 06/07/2016)  . TETANUS/TDAP  04/20/2026  . INFLUENZA VACCINE  Completed  . DEXA SCAN  Completed  . Hepatitis C Screening  Completed    Cancer Screenings: Lung: Low Dose CT Chest recommended if Age 61-80 years, 30 pack-year currently smoking OR have quit w/in 15 years. Patient does not qualify. Breast:  Up to date on Mammogram: Yes, completed 03/16/2019   Up to date of Bone Density/Dexa: Yes, completed 05/03/2018 Colorectal: declined  Additional Screenings:   Hepatitis C Screening: 04/20/2016     Plan:   Patient will continue to ride her stationary bike everyday for 1-2 miles.    I have personally reviewed and noted the following in the patient's chart:   . Medical and social history . Use of alcohol, tobacco or illicit drugs  . Current medications and supplements . Functional ability and status . Nutritional status . Physical activity . Advanced directives . List of other physicians . Hospitalizations, surgeries, and ER visits in previous 12 months . Vitals . Screenings to include cognitive, depression, and falls . Referrals and appointments  In addition, I have reviewed and discussed with patient certain preventive protocols, quality metrics, and best practice recommendations. A written personalized care plan for preventive services as well as general preventive health recommendations were provided to patient.     Andrez Grime, LPN  [phone]

## 2019-10-11 NOTE — Progress Notes (Signed)
PCP notes:  Health Maintenance: Prevnar 13- Patient will check her records. Believes she has already has this vaccine in the past.   Colonoscopy- declined   Abnormal Screenings: none   Patient concerns: Would like to have labs done to check her thyroid and talk about having an ultrasound of her thyroid completed   Nurse concerns: none   Next PCP appt.: 10/17/2019 @ 11:20 am

## 2019-10-11 NOTE — Patient Instructions (Signed)
Leslie Dixon , Thank you for taking time to come for your Medicare Wellness Visit. I appreciate your ongoing commitment to your health goals. Please review the following plan we discussed and let me know if I can assist you in the future.   Screening recommendations/referrals: Colonoscopy: declined Mammogram: Up to date, completed 03/16/2019 Bone Density: Up to date, completed 05/03/2018 Recommended yearly ophthalmology/optometry visit for glaucoma screening and checkup Recommended yearly dental visit for hygiene and checkup  Vaccinations: Influenza vaccine: Up to date, completed 04/25/2019 Pneumococcal vaccine: will check her records  Tdap vaccine: Up to date, completed 04/20/2016 Shingles vaccine: Completed series    Advanced directives: Please bring a copy of your POA (Power of [place]) and/or Living Will to your next appointment.   Conditions/risks identified: none  Next appointment: 10/17/2019 @ 11:20 am    Preventive Care 65 Years and Older, Female Preventive care refers to lifestyle choices and visits with your health care provider that can promote health and wellness. What does preventive care include?  A yearly physical exam. This is also called an annual well check.  Dental exams once or twice a year.  Routine eye exams. Ask your health care provider how often you should have your eyes checked.  Personal lifestyle choices, including:  Daily care of your teeth and gums.  Regular physical activity.  Eating a healthy diet.  Avoiding tobacco and drug use.  Limiting alcohol use.  Practicing safe sex.  Taking low-dose aspirin every day.  Taking vitamin and mineral supplements as recommended by your health care provider. What happens during an annual well check? The services and screenings done by your health care provider during your annual well check will depend on your age, overall health, lifestyle risk factors, and family history of disease. Counseling  Your  health care provider may ask you questions about your:  Alcohol use.  Tobacco use.  Drug use.  Emotional well-being.  Home and relationship well-being.  Sexual activity.  Eating habits.  History of falls.  Memory and ability to understand (cognition).  Work and work Statistician.  Reproductive health. Screening  You may have the following tests or measurements:  Height, weight, and BMI.  Blood pressure.  Lipid and cholesterol levels. These may be checked every 5 years, or more frequently if you are over 54 years old.  Skin check.  Lung cancer screening. You may have this screening every year starting at age 57 if you have a 30-pack-year history of smoking and currently smoke or have quit within the past 15 years.  Fecal occult blood test (FOBT) of the stool. You may have this test every year starting at age 3.  Flexible sigmoidoscopy or colonoscopy. You may have a sigmoidoscopy every 5 years or a colonoscopy every 10 years starting at age 95.  Hepatitis C blood test.  Hepatitis B blood test.  Sexually transmitted disease (STD) testing.  Diabetes screening. This is done by checking your blood sugar (glucose) after you have not eaten for a while (fasting). You may have this done every 1-3 years.  Bone density scan. This is done to screen for osteoporosis. You may have this done starting at age 25.  Mammogram. This may be done every 1-2 years. Talk to your health care provider about how often you should have regular mammograms. Talk with your health care provider about your test results, treatment options, and if necessary, the need for more tests. Vaccines  Your health care provider may recommend certain vaccines, such as:  Influenza vaccine. This is recommended every year.  Tetanus, diphtheria, and acellular pertussis (Tdap, Td) vaccine. You may need a Td booster every 10 years.  Zoster vaccine. You may need this after age 55.  Pneumococcal 13-valent  conjugate (PCV13) vaccine. One dose is recommended after age 73.  Pneumococcal polysaccharide (PPSV23) vaccine. One dose is recommended after age 11. Talk to your health care provider about which screenings and vaccines you need and how often you need them. This information is not intended to replace advice given to you by your health care provider. Make sure you discuss any questions you have with your health care provider. Document Released: 08/08/2015 Document Revised: 03/31/2016 Document Reviewed: 05/13/2015 Elsevier Interactive Patient Education  2017 [place] Prevention in the Home Falls can cause injuries. They can happen to people of all ages. There are many things you can do to make your home safe and to help prevent falls. What can I do on the outside of my home?  Regularly fix the edges of walkways and driveways and fix any cracks.  Remove anything that might make you trip as you walk through a door, such as a raised step or threshold.  Trim any bushes or trees on the path to your home.  Use bright outdoor lighting.  Clear any walking paths of anything that might make someone trip, such as rocks or tools.  Regularly check to see if handrails are loose or broken. Make sure that both sides of any steps have handrails.  Any raised decks and porches should have guardrails on the edges.  Have any leaves, snow, or ice cleared regularly.  Use sand or salt on walking paths during winter.  Clean up any spills in your garage right away. This includes oil or grease spills. What can I do in the bathroom?  Use night lights.  Install grab bars by the toilet and in the tub and shower. Do not use towel bars as grab bars.  Use non-skid mats or decals in the tub or shower.  If you need to sit down in the shower, use a plastic, non-slip stool.  Keep the floor dry. Clean up any water that spills on the floor as soon as it happens.  Remove soap buildup in the tub or  shower regularly.  Attach bath mats securely with double-sided non-slip rug tape.  Do not have throw rugs and other things on the floor that can make you trip. What can I do in the bedroom?  Use night lights.  Make sure that you have a light by your bed that is easy to reach.  Do not use any sheets or blankets that are too big for your bed. They should not hang down onto the floor.  Have a firm chair that has side arms. You can use this for support while you get dressed.  Do not have throw rugs and other things on the floor that can make you trip. What can I do in the kitchen?  Clean up any spills right away.  Avoid walking on wet floors.  Keep items that you use a lot in easy-to-reach places.  If you need to reach something above you, use a strong step stool that has a grab bar.  Keep electrical cords out of the way.  Do not use floor polish or wax that makes floors slippery. If you must use wax, use non-skid floor wax.  Do not have throw rugs and other things on the floor that  can make you trip. What can I do with my stairs?  Do not leave any items on the stairs.  Make sure that there are handrails on both sides of the stairs and use them. Fix handrails that are broken or loose. Make sure that handrails are as long as the stairways.  Check any carpeting to make sure that it is firmly attached to the stairs. Fix any carpet that is loose or worn.  Avoid having throw rugs at the top or bottom of the stairs. If you do have throw rugs, attach them to the floor with carpet tape.  Make sure that you have a light switch at the top of the stairs and the bottom of the stairs. If you do not have them, ask someone to add them for you. What else can I do to help prevent falls?  Wear shoes that:  Do not have high heels.  Have rubber bottoms.  Are comfortable and fit you well.  Are closed at the toe. Do not wear sandals.  If you use a stepladder:  Make sure that it is fully  opened. Do not climb a closed stepladder.  Make sure that both sides of the stepladder are locked into place.  Ask someone to hold it for you, if possible.  Clearly mark and make sure that you can see:  Any grab bars or handrails.  First and last steps.  Where the edge of each step is.  Use tools that help you move around (mobility aids) if they are needed. These include:  Canes.  Walkers.  Scooters.  Crutches.  Turn on the lights when you go into a dark area. Replace any light bulbs as soon as they burn out.  Set up your furniture so you have a clear path. Avoid moving your furniture around.  If any of your floors are uneven, fix them.  If there are any pets around you, be aware of where they are.  Review your medicines with your doctor. Some medicines can make you feel dizzy. This can increase your chance of falling. Ask your doctor what other things that you can do to help prevent falls. This information is not intended to replace advice given to you by your health care provider. Make sure you discuss any questions you have with your health care provider. Document Released: 05/08/2009 Document Revised: 12/18/2015 Document Reviewed: 08/16/2014 Elsevier Interactive Patient Education  2017 [employer].

## 2019-10-17 ENCOUNTER — Ambulatory Visit (INDEPENDENT_AMBULATORY_CARE_PROVIDER_SITE_OTHER): Payer: Medicare Other | Admitting: Family Medicine

## 2019-10-17 ENCOUNTER — Other Ambulatory Visit: Payer: Self-pay

## 2019-10-17 ENCOUNTER — Encounter: Payer: Self-pay | Admitting: Family Medicine

## 2019-10-17 VITALS — BP 102/58 | HR 75 | Temp 98.2°F | Resp 20 | Ht 66.5 in | Wt 143.2 lb

## 2019-10-17 DIAGNOSIS — M1990 Unspecified osteoarthritis, unspecified site: Secondary | ICD-10-CM | POA: Diagnosis not present

## 2019-10-17 DIAGNOSIS — E041 Nontoxic single thyroid nodule: Secondary | ICD-10-CM | POA: Diagnosis not present

## 2019-10-17 DIAGNOSIS — D72819 Decreased white blood cell count, unspecified: Secondary | ICD-10-CM

## 2019-10-17 DIAGNOSIS — Q631 Lobulated, fused and horseshoe kidney: Secondary | ICD-10-CM | POA: Diagnosis not present

## 2019-10-17 DIAGNOSIS — M810 Age-related osteoporosis without current pathological fracture: Secondary | ICD-10-CM

## 2019-10-17 DIAGNOSIS — J3089 Other allergic rhinitis: Secondary | ICD-10-CM

## 2019-10-17 LAB — CBC WITH DIFFERENTIAL/PLATELET
Basophils Absolute: 0 10*3/uL (ref 0.0–0.1)
Basophils Relative: 0.6 % (ref 0.0–3.0)
Eosinophils Absolute: 0.1 10*3/uL (ref 0.0–0.7)
Eosinophils Relative: 2.8 % (ref 0.0–5.0)
HCT: 40.7 % (ref 36.0–46.0)
Hemoglobin: 13.4 g/dL (ref 12.0–15.0)
Lymphocytes Relative: 38.3 % (ref 12.0–46.0)
Lymphs Abs: 1.6 10*3/uL (ref 0.7–4.0)
MCHC: 32.9 g/dL (ref 30.0–36.0)
MCV: 91.1 fl (ref 78.0–100.0)
Monocytes Absolute: 0.4 10*3/uL (ref 0.1–1.0)
Monocytes Relative: 10.1 % (ref 3.0–12.0)
Neutro Abs: 2 10*3/uL (ref 1.4–7.7)
Neutrophils Relative %: 48.2 % (ref 43.0–77.0)
Platelets: 168 10*3/uL (ref 150.0–400.0)
RBC: 4.47 Mil/uL (ref 3.87–5.11)
RDW: 16 % — ABNORMAL HIGH (ref 11.5–15.5)
WBC: 4.2 10*3/uL (ref 4.0–10.5)

## 2019-10-17 LAB — COMPREHENSIVE METABOLIC PANEL
ALT: 17 U/L (ref 0–35)
AST: 20 U/L (ref 0–37)
Albumin: 4 g/dL (ref 3.5–5.2)
Alkaline Phosphatase: 53 U/L (ref 39–117)
BUN: 20 mg/dL (ref 6–23)
CO2: 26 mEq/L (ref 19–32)
Calcium: 9.2 mg/dL (ref 8.4–10.5)
Chloride: 106 mEq/L (ref 96–112)
Creatinine, Ser: 0.68 mg/dL (ref 0.40–1.20)
GFR: 84.09 mL/min (ref >60.00)
Glucose, Bld: 76 mg/dL (ref 70–99)
Potassium: 4 mEq/L (ref 3.5–5.1)
Sodium: 139 mEq/L (ref 135–145)
Total Bilirubin: 0.4 mg/dL (ref 0.2–1.2)
Total Protein: 6.3 g/dL (ref 6.0–8.3)

## 2019-10-17 LAB — TSH: TSH: 2.39 u[IU]/mL (ref 0.35–4.50)

## 2019-10-17 NOTE — Progress Notes (Signed)
Subjective:     Leslie Dixon is a 74 y.o. female presenting for Follow-up (had AWV on 10/11/19)     HPI   #Thyroid issues - hx of thyroid nodule - would like thyroid check - cold intolerance - has noticed some fingernail issues - is able to see it when she looks in the mirror - sees GI for esophageal issues   #arthritis - still doing the stationary bicycle - had been able to raise the resistance on this - rare use of the meloxicam   Review of Systems  Constitutional: Negative for fatigue and unexpected weight change.  Endocrine: Positive for cold intolerance.     Social History   Tobacco Use  Smoking Status Never Smoker  Smokeless Tobacco Never Used        Objective:    BP Readings from Last 3 Encounters:  10/17/19 (!) 102/58  10/11/19 (!) 110/56  06/18/19 122/70   Wt Readings from Last 3 Encounters:  10/17/19 143 lb 4 oz (65 kg)  10/11/19 132 lb (59.9 kg)  06/18/19 142 lb 3.2 oz (64.5 kg)    BP (!) 102/58   Pulse 75   Temp 98.2 F (36.8 C)   Resp 20   Ht 5' 6.5" (1.689 m)   Wt 143 lb 4 oz (65 kg)   BMI 22.77 kg/m    Physical Exam Constitutional:      General: She is not in acute distress.    Appearance: She is well-developed. She is not diaphoretic.  HENT:     Right Ear: External ear normal.     Left Ear: External ear normal.     Nose: Nose normal.  Eyes:     Conjunctiva/sclera: Conjunctivae normal.  Neck:     Thyroid: Thyroid mass (right side small nodule) present. No thyromegaly or thyroid tenderness.  Cardiovascular:     Rate and Rhythm: Normal rate and regular rhythm.     Heart sounds: No murmur.  Pulmonary:     Effort: Pulmonary effort is normal. No respiratory distress.     Breath sounds: Normal breath sounds. No wheezing.  Musculoskeletal:     Cervical back: Normal range of motion and neck supple.  Skin:    General: Skin is warm and dry.     Capillary Refill: Capillary refill takes less than 2 seconds.  Neurological:     Mental Status: She is alert. Mental status is at baseline.  Psychiatric:        Mood and Affect: Mood normal.        Behavior: Behavior normal.           Assessment & Plan:   Problem List Items Addressed This Visit      Endocrine   Right thyroid nodule    Discussed annual TSH. No need for imaging unless she notices a change in size, stable exam or if TSH changes. TSH today        Musculoskeletal and Integument   Osteoporosis (Chronic)    S/p boniva. Last dexa 2019. Plan repeat in October, if worsening may discuss other medications she could try. Continue exercise, Vit D and calcium      Osteoarthritis    Rare NSAIDs ok. Continue stationary bike exercise and tumeric.       Relevant Orders   Comprehensive metabolic panel     Genitourinary   Horseshoe kidney     Other   Idiopathic benign Leukopenia (Chronic)    Previously saw hematology and told it  was Benign was low most recently. Will repeat.       Relevant Orders   CBC with Differential   Environmental and seasonal allergies - Primary    Stable. Controled with medication      Relevant Orders   Comprehensive metabolic panel    Other Visit Diagnoses    Thyroid nodule       Relevant Orders   TSH       Return in about 1 year (around 10/16/2020).  Lesleigh Noe, MD

## 2019-10-17 NOTE — Assessment & Plan Note (Signed)
Rare NSAIDs ok. Continue stationary bike exercise and tumeric.

## 2019-10-17 NOTE — Assessment & Plan Note (Signed)
S/p boniva. Last dexa 2019. Plan repeat in October, if worsening may discuss other medications she could try. Continue exercise, Vit D and calcium

## 2019-10-17 NOTE — Assessment & Plan Note (Signed)
Discussed annual TSH. No need for imaging unless she notices a change in size, stable exam or if TSH changes. TSH today

## 2019-10-17 NOTE — Assessment & Plan Note (Signed)
Previously saw hematology and told it was Benign was low most recently. Will repeat.

## 2019-10-17 NOTE — Assessment & Plan Note (Signed)
Stable. Controled with medication

## 2019-10-17 NOTE — Patient Instructions (Addendum)
Great to see you!  Blood work today - results in [facility] for the dexa in october

## 2019-10-22 DIAGNOSIS — J310 Chronic rhinitis: Secondary | ICD-10-CM | POA: Diagnosis not present

## 2019-10-22 DIAGNOSIS — J342 Deviated nasal septum: Secondary | ICD-10-CM | POA: Diagnosis not present

## 2019-10-22 DIAGNOSIS — J343 Hypertrophy of nasal turbinates: Secondary | ICD-10-CM | POA: Diagnosis not present

## 2019-11-16 ENCOUNTER — Telehealth: Payer: Self-pay | Admitting: Family Medicine

## 2019-11-16 DIAGNOSIS — K529 Noninfective gastroenteritis and colitis, unspecified: Secondary | ICD-10-CM | POA: Diagnosis not present

## 2019-11-16 DIAGNOSIS — R1311 Dysphagia, oral phase: Secondary | ICD-10-CM | POA: Diagnosis not present

## 2019-11-16 NOTE — Telephone Encounter (Signed)
Patient called.  Patient said she started throwing up and diarrhea on Tuesday.  Patient said the diarrhea stopped on Wednesday and diarrhea stopped on Thursday.  Patient said she's weak and shaky.  She has been able to drink Gatorade and chicken broth.  Patient spoke to her GI doctor and he told her she needed to have her electrolytes checked.  I spoke to [place].  Leafy Ro said patient would need to go to Urgent Care because we couldn't see patient in the office and do lab work.  Patient said she would not go to urgent care and she'll work through it herself and hung up.

## 2019-11-16 NOTE — Telephone Encounter (Signed)
Noted  

## 2019-11-22 DIAGNOSIS — L308 Other specified dermatitis: Secondary | ICD-10-CM | POA: Diagnosis not present

## 2019-11-22 DIAGNOSIS — L821 Other seborrheic keratosis: Secondary | ICD-10-CM | POA: Diagnosis not present

## 2019-11-22 DIAGNOSIS — L603 Nail dystrophy: Secondary | ICD-10-CM | POA: Diagnosis not present

## 2019-12-03 DIAGNOSIS — H2511 Age-related nuclear cataract, right eye: Secondary | ICD-10-CM | POA: Diagnosis not present

## 2019-12-03 DIAGNOSIS — H04123 Dry eye syndrome of bilateral lacrimal glands: Secondary | ICD-10-CM | POA: Diagnosis not present

## 2019-12-03 DIAGNOSIS — H2012 Chronic iridocyclitis, left eye: Secondary | ICD-10-CM | POA: Diagnosis not present

## 2019-12-03 DIAGNOSIS — Z961 Presence of intraocular lens: Secondary | ICD-10-CM | POA: Diagnosis not present

## 2019-12-05 ENCOUNTER — Encounter: Payer: Self-pay | Admitting: Internal Medicine

## 2019-12-05 ENCOUNTER — Telehealth (INDEPENDENT_AMBULATORY_CARE_PROVIDER_SITE_OTHER): Payer: Medicare Other | Admitting: Internal Medicine

## 2019-12-05 DIAGNOSIS — W57XXXA Bitten or stung by nonvenomous insect and other nonvenomous arthropods, initial encounter: Secondary | ICD-10-CM

## 2019-12-05 DIAGNOSIS — Z7689 Persons encountering health services in other specified circumstances: Secondary | ICD-10-CM | POA: Diagnosis not present

## 2019-12-05 DIAGNOSIS — F4321 Adjustment disorder with depressed mood: Secondary | ICD-10-CM | POA: Diagnosis not present

## 2019-12-05 DIAGNOSIS — S80862A Insect bite (nonvenomous), left lower leg, initial encounter: Secondary | ICD-10-CM | POA: Diagnosis not present

## 2019-12-05 DIAGNOSIS — A059 Bacterial foodborne intoxication, unspecified: Secondary | ICD-10-CM

## 2019-12-05 DIAGNOSIS — K529 Noninfective gastroenteritis and colitis, unspecified: Secondary | ICD-10-CM

## 2019-12-05 MED ORDER — DOXYCYCLINE HYCLATE 100 MG PO TABS: 100.0000 mg | ORAL_TABLET | Freq: Two times a day (BID) | ORAL | 0 refills | Status: DC

## 2019-12-05 NOTE — Progress Notes (Signed)
Virtual Visit via Telephone Note  I connected with Leslie Dixon, on 12/05/2019 at 2:50 PM by telephone due to the COVID-19 pandemic and verified that I am speaking with the correct person using two identifiers.   Consent: I discussed the limitations, risks, security and privacy concerns of performing an evaluation and management service by telephone and the availability of in person appointments. I also discussed with the patient that there may be a patient responsible charge related to this service. The patient expressed understanding and agreed to proceed.   Location of Patient: Home   Location of Provider: Clinic    Persons participating in Telemedicine visit: Darlin Dressel [facility] Dr. Juleen China      History of Present Illness: Patient has a visit to establish care.   She reports concerns about food poisoning that she had on April 20. It took her over a week to feel better. She ate a lot of chicken broth and drank Gatorade. She reports that she has always had some issues with low potassium.   Concern about a bug bite and ? Developing cellulitis. This bite occurred 2 weeks ago. Located about 2-3 inches above ankle is where the bite occurred. She reports redness spreads down to her shoe line. Has been trying to stay off of it a little bit but she is very active around her house and in her garden. Afebrile.    Past Medical History:  Diagnosis Date  . Allergy   . Arthritis   . Blood dyscrasia    LEUKOPENIA- DR. CHISM - NO TREATMENT BUT IS MONITORED  . Chronic back pain   . Chronic back pain   . Fibromyalgia   . Foot drop, right    RELATED TO LUMBAR PROBLEMS  . GERD (gastroesophageal reflux disease)    PRILOSEC PRN  . History of kidney stones   . Horseshoe kidney    BILATERAL  . Leukopenia   . Migraine    OCCULAR MIGRAINES - AURA WITH EYES  . Osteoporosis   . Pain    LUMBAR PAIN - RECENT FALL BECAUSE LEGS GAVE OUT - PT HAS HAD LUMBAR INJECTION SINCE THE FALL  THAT HAS HELPED BACK PAIN  . Pain    CHRONIC RIGHT UPPER QUADRANT PAIN - RELATED TO GALLBLADDER PROBLEM  . Sciatica of right side   . Sinusitis    f/u with Dr. Janace Hoard.   . Vertigo    Allergies  Allergen Reactions  . Peanut Oil Anaphylaxis  . Singulair [Montelukast]     Facial swelling  . Clarithromycin     REACTION: GI  . Codeine     REACTION: nausea and vomiting  . Lactose     Other reaction(s): GI Upset (intolerance)  . 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 Penicillins     REACTION: per allergy testing/Patient states she has taken Augmentin without complications.   . Pregabalin     REACTION: headache, several side effects  . Sulfa Antibiotics Other (See Comments)    REACTION: Hives, wheezing  . Sulfonamide Derivatives     REACTION: Hives, wheezing  . Cefdinir Rash    Current Outpatient Medications on File Prior to Visit  Medication Sig Dispense Refill  . albuterol (VENTOLIN HFA) 108 (90 Base) MCG/ACT inhaler Inhale 2 puffs into the lungs every 6 (six) hours as needed for wheezing or shortness of breath. 18 g 2  . Fluticasone Furoate (ARNUITY ELLIPTA) 100 MCG/ACT AEPB Inhale  1 puff into the lungs daily. 90 each 1  . loratadine (CLARITIN) 10 MG tablet Take 10 mg by mouth daily.    . meloxicam (MOBIC) 7.5 MG tablet Take 1 tablet (7.5 mg total) by mouth daily as needed for pain. 90 tablet 1  . omeprazole (PRILOSEC) 20 MG capsule TAKE 1 CAPSULE TWICE A DAY 180 capsule 0  . SUMAtriptan (IMITREX) 100 MG tablet TAKE 1 2 (ONE HALF) TABLET BY MOUTH AS NEEDED FOR MIGRAINE  3  . TURMERIC PO Take 2,000 Units by mouth daily at 8 pm.     . vitamin B-12 (CYANOCOBALAMIN) 1000 MCG tablet Take 1,000 mcg by mouth daily.    . Vitamin D, Cholecalciferol, 1000 units CAPS Take 1 capsule by mouth daily.     . vitamin E 400 UNIT capsule Take 800 Units by mouth daily.      No current facility-administered medications on file prior to visit.     Observations/Objective: NAD. Speaking clearly.  Work of breathing normal.  Alert and oriented. Mood appropriate.   Assessment and Plan: 1. Encounter to establish care Reviewed patient's PMH, social history, surgical history, and medications.  She is UTD on HM topics.   2. Grief reaction Unfortunately, patient lost her brother to Celeste on Good Friday this year. She is grieving normally and appropriately.   3. Insect bite of left lower leg, initial encounter Concern that she may be developing cellulitis from her report that skin feels warm and has extending erythema. Treat with Doxy.  - doxycycline (VIBRA-TABS) 100 MG tablet; Take 1 tablet (100 mg total) by mouth 2 (two) times daily.  Dispense: 20 tablet; Refill: 0  4. Gastroenteritis Recovered from recent gastroenteritis. Will check BMET especially given patient report that she has a history of hypokalemia.  - Basic Metabolic Panel; Future   Follow Up Instructions: Lab visit 5/14   I discussed the assessment and treatment plan with the patient. The patient was provided an opportunity to ask questions and all were answered. The patient agreed with the plan and demonstrated an understanding of the instructions.   The patient was advised to call back or seek an in-person evaluation if the symptoms worsen or if the condition fails to improve as anticipated.     I provided 14 minutes total of non-face-to-face time during this encounter including median intraservice time, reviewing previous notes, investigations, ordering medications, medical decision making, coordinating care and patient verbalized understanding at the end of the visit.    Phill Myron, D.O. Primary Care at [facility]  12/05/2019, 2:50 PM

## 2019-12-07 ENCOUNTER — Other Ambulatory Visit (INDEPENDENT_AMBULATORY_CARE_PROVIDER_SITE_OTHER): Payer: Medicare Other

## 2019-12-07 DIAGNOSIS — K529 Noninfective gastroenteritis and colitis, unspecified: Secondary | ICD-10-CM

## 2019-12-08 LAB — BASIC METABOLIC PANEL
BUN/Creatinine Ratio: 28 (ref 12–28)
BUN: 21 mg/dL (ref 8–27)
CO2: 21 mmol/L (ref 20–29)
Calcium: 8.9 mg/dL (ref 8.7–10.3)
Chloride: 105 mmol/L (ref 96–106)
Creatinine, Ser: 0.76 mg/dL (ref 0.57–1.00)
GFR calc Af Amer: 88 mL/min/{1.73_m2} (ref >59)
GFR calc non Af Amer: 76 mL/min/{1.73_m2} (ref >59)
Glucose: 77 mg/dL (ref 65–99)
Potassium: 4.5 mmol/L (ref 3.5–5.2)
Sodium: 140 mmol/L (ref 134–144)

## 2019-12-11 NOTE — Progress Notes (Signed)
Patient notified of results & recommendations. Expressed understanding.

## 2019-12-18 DIAGNOSIS — J310 Chronic rhinitis: Secondary | ICD-10-CM | POA: Diagnosis not present

## 2019-12-18 DIAGNOSIS — J343 Hypertrophy of nasal turbinates: Secondary | ICD-10-CM | POA: Diagnosis not present

## 2019-12-21 DIAGNOSIS — R1311 Dysphagia, oral phase: Secondary | ICD-10-CM | POA: Diagnosis not present

## 2019-12-21 DIAGNOSIS — K589 Irritable bowel syndrome without diarrhea: Secondary | ICD-10-CM | POA: Diagnosis not present

## 2019-12-21 DIAGNOSIS — Z8371 Family history of colonic polyps: Secondary | ICD-10-CM | POA: Diagnosis not present

## 2019-12-26 ENCOUNTER — Encounter: Payer: Self-pay | Admitting: Pulmonary Disease

## 2019-12-26 ENCOUNTER — Other Ambulatory Visit: Payer: Self-pay

## 2019-12-26 ENCOUNTER — Ambulatory Visit: Payer: Medicare Other | Admitting: Pulmonary Disease

## 2019-12-26 VITALS — BP 118/60 | HR 77 | Temp 97.3°F | Ht 66.5 in | Wt 140.6 lb

## 2019-12-26 DIAGNOSIS — J3089 Other allergic rhinitis: Secondary | ICD-10-CM

## 2019-12-26 DIAGNOSIS — J4520 Mild intermittent asthma, uncomplicated: Secondary | ICD-10-CM

## 2019-12-26 DIAGNOSIS — J302 Other seasonal allergic rhinitis: Secondary | ICD-10-CM

## 2019-12-26 DIAGNOSIS — K219 Gastro-esophageal reflux disease without esophagitis: Secondary | ICD-10-CM

## 2019-12-26 NOTE — Patient Instructions (Signed)
Your lungs sound clear today.  Doing good job with your nasal hygiene I do not think you need any steroids or antibiotics currently.  However, if you develop fever or green looking nasal drainage let us now.  Schedule breathing tests to evaluate your asthma.   We will see you in follow-up in 3 months time call sooner should any new difficulties arise.

## 2019-12-31 DIAGNOSIS — H2511 Age-related nuclear cataract, right eye: Secondary | ICD-10-CM | POA: Diagnosis not present

## 2019-12-31 DIAGNOSIS — Z961 Presence of intraocular lens: Secondary | ICD-10-CM | POA: Diagnosis not present

## 2019-12-31 DIAGNOSIS — H2012 Chronic iridocyclitis, left eye: Secondary | ICD-10-CM | POA: Diagnosis not present

## 2019-12-31 DIAGNOSIS — H04123 Dry eye syndrome of bilateral lacrimal glands: Secondary | ICD-10-CM | POA: Diagnosis not present

## 2020-01-07 ENCOUNTER — Other Ambulatory Visit: Payer: Self-pay | Admitting: Internal Medicine

## 2020-01-07 DIAGNOSIS — M25521 Pain in right elbow: Secondary | ICD-10-CM | POA: Diagnosis not present

## 2020-01-07 DIAGNOSIS — Z1231 Encounter for screening mammogram for malignant neoplasm of breast: Secondary | ICD-10-CM

## 2020-01-07 DIAGNOSIS — M79631 Pain in right forearm: Secondary | ICD-10-CM | POA: Diagnosis not present

## 2020-01-07 DIAGNOSIS — M542 Cervicalgia: Secondary | ICD-10-CM | POA: Diagnosis not present

## 2020-01-11 ENCOUNTER — Telehealth: Payer: Self-pay

## 2020-01-11 NOTE — Telephone Encounter (Signed)
Pt is aware of date/time of covid test prior to PFT. Nothing further is needed.

## 2020-01-16 ENCOUNTER — Other Ambulatory Visit
Admission: RE | Admit: 2020-01-16 | Discharge: 2020-01-16 | Disposition: A | Discharge status: Home / Self Care | Payer: Medicare Other | Source: Ambulatory Visit | Attending: Pulmonary Disease | Admitting: Pulmonary Disease

## 2020-01-16 ENCOUNTER — Other Ambulatory Visit: Payer: Self-pay

## 2020-01-16 DIAGNOSIS — Z20822 Contact with and (suspected) exposure to covid-19: Secondary | ICD-10-CM | POA: Insufficient documentation

## 2020-01-16 DIAGNOSIS — Z01812 Encounter for preprocedural laboratory examination: Secondary | ICD-10-CM | POA: Diagnosis present

## 2020-01-16 LAB — SARS CORONAVIRUS 2 (TAT 6-24 HRS): SARS Coronavirus 2: NEGATIVE

## 2020-01-17 ENCOUNTER — Ambulatory Visit: Payer: Medicare Other | Attending: Pulmonary Disease

## 2020-01-17 DIAGNOSIS — J4520 Mild intermittent asthma, uncomplicated: Secondary | ICD-10-CM | POA: Diagnosis present

## 2020-01-17 MED ORDER — ALBUTEROL SULFATE (2.5 MG/3ML) 0.083% IN NEBU
2.5000 mg | INHALATION_SOLUTION | Freq: Once | RESPIRATORY_TRACT | Status: AC
  Administered 2020-01-17: 2.5 mg via RESPIRATORY_TRACT
  Filled 2020-01-17: qty 3

## 2020-02-13 ENCOUNTER — Telehealth: Payer: Self-pay | Admitting: Pulmonary Disease

## 2020-02-13 DIAGNOSIS — J4520 Mild intermittent asthma, uncomplicated: Secondary | ICD-10-CM

## 2020-02-13 DIAGNOSIS — M5136 Other intervertebral disc degeneration, lumbar region: Secondary | ICD-10-CM | POA: Diagnosis not present

## 2020-02-13 DIAGNOSIS — M5030 Other cervical disc degeneration, unspecified cervical region: Secondary | ICD-10-CM | POA: Diagnosis not present

## 2020-02-13 DIAGNOSIS — M5412 Radiculopathy, cervical region: Secondary | ICD-10-CM | POA: Diagnosis not present

## 2020-02-13 DIAGNOSIS — M961 Postlaminectomy syndrome, not elsewhere classified: Secondary | ICD-10-CM | POA: Diagnosis not present

## 2020-02-13 MED ORDER — ALBUTEROL SULFATE HFA 108 (90 BASE) MCG/ACT IN AERS: 2.0000 | INHALATION_SPRAY | Freq: Four times a day (QID) | RESPIRATORY_TRACT | 1 refills | Status: DC | PRN

## 2020-02-13 NOTE — Telephone Encounter (Signed)
Lm for pt

## 2020-02-13 NOTE — Telephone Encounter (Signed)
Called and spoke to pt, who is requesting a refill on ventolin, as her Rx is expired.  Pt stated that she developed an asthma flare yesterday. Breathing is better today per pt.  Rx for ventolin has been sent to preferred pharmacy. Nothing further is needed.

## 2020-02-21 DIAGNOSIS — J4521 Mild intermittent asthma with (acute) exacerbation: Secondary | ICD-10-CM | POA: Diagnosis not present

## 2020-02-21 DIAGNOSIS — J0100 Acute maxillary sinusitis, unspecified: Secondary | ICD-10-CM | POA: Diagnosis not present

## 2020-03-16 ENCOUNTER — Other Ambulatory Visit: Payer: Self-pay | Admitting: Pulmonary Disease

## 2020-03-17 ENCOUNTER — Other Ambulatory Visit: Payer: Self-pay

## 2020-03-17 ENCOUNTER — Ambulatory Visit
Admission: RE | Admit: 2020-03-17 | Discharge: 2020-03-17 | Disposition: A | Discharge status: Home / Self Care | Payer: Medicare Other | Source: Ambulatory Visit | Attending: Internal Medicine | Admitting: Internal Medicine

## 2020-03-17 DIAGNOSIS — Z1231 Encounter for screening mammogram for malignant neoplasm of breast: Secondary | ICD-10-CM | POA: Diagnosis not present

## 2020-03-21 ENCOUNTER — Telehealth: Payer: Self-pay | Admitting: Endocrinology

## 2020-03-21 NOTE — Telephone Encounter (Signed)
Patient called to see if she could get her Thyroid scan re-done - this would be a re-check of the thyroid growth found 2 year.  Contact number [phone]

## 2020-03-21 NOTE — Telephone Encounter (Signed)
Pt canceled annual follow up that was scheduled 06/15/19 but failed to reschedule appt. Pt will require an appt for eval before an [place] can be ordered. Called pt and informed her about the need for an appt and that Dr. Loanne Drilling will address her request then. Pt scheduled 10/31 for follow up

## 2020-03-25 ENCOUNTER — Ambulatory Visit: Payer: Medicare Other | Admitting: Endocrinology

## 2020-04-02 DIAGNOSIS — J0100 Acute maxillary sinusitis, unspecified: Secondary | ICD-10-CM | POA: Diagnosis not present

## 2020-04-02 DIAGNOSIS — H6981 Other specified disorders of Eustachian tube, right ear: Secondary | ICD-10-CM | POA: Diagnosis not present

## 2020-04-02 DIAGNOSIS — H90A31 Mixed conductive and sensorineural hearing loss, unilateral, right ear with restricted hearing on the contralateral side: Secondary | ICD-10-CM | POA: Diagnosis not present

## 2020-04-15 ENCOUNTER — Encounter: Payer: Self-pay | Admitting: Pulmonary Disease

## 2020-04-15 ENCOUNTER — Other Ambulatory Visit: Payer: Self-pay

## 2020-04-15 ENCOUNTER — Ambulatory Visit (INDEPENDENT_AMBULATORY_CARE_PROVIDER_SITE_OTHER): Payer: Medicare Other | Admitting: Pulmonary Disease

## 2020-04-15 VITALS — BP 122/64 | HR 89 | Temp 98.0°F | Ht 66.5 in | Wt 144.6 lb

## 2020-04-15 DIAGNOSIS — J4520 Mild intermittent asthma, uncomplicated: Secondary | ICD-10-CM

## 2020-04-15 DIAGNOSIS — J3089 Other allergic rhinitis: Secondary | ICD-10-CM

## 2020-04-15 DIAGNOSIS — J302 Other seasonal allergic rhinitis: Secondary | ICD-10-CM | POA: Diagnosis not present

## 2020-04-15 DIAGNOSIS — K219 Gastro-esophageal reflux disease without esophagitis: Secondary | ICD-10-CM | POA: Diagnosis not present

## 2020-04-15 NOTE — Patient Instructions (Signed)
Follow up in 6 months 

## 2020-04-15 NOTE — Progress Notes (Signed)
Subjective:    Patient ID: Leslie Dixon, female    DOB: 1946/03/11, 74 y.o.   MRN: [id number]  HPI Patient is a 74 year old-never smoker with moderate intermittent asthma.  We last saw her here on 18 June 2019.  She presents for follow-up of this issue.  She is currently maintained on and as needed Ventolin.  She has been doing very well and her cough has been very well controlled.  She has sinus issues for which she follows with Dr. Pryor Ochoa.  She has not had any recent flareups.  Her laryngopharyngeal reflux is well controlled.  Overall she feels well and looks well.  No fevers, chills or sweats.  No recent cough or sputum production.  No hemoptysis.  Her sinus issues have been in check.  She wears a mask when she does her gardening.  She has been following strict masking and social distancing during the COVID-19 pandemic.  She has had the [place] vaccine.  She was somewhat tearful today relating that she lost her brother to Covid several months back.  Been very stressful for her.  This has however made her more aware of preventing COVID-19 infection.  Review of Systems A 10 point review of systems was performed and it is as noted above otherwise negative.  Allergies  Allergen Reactions  . Peanut Oil Anaphylaxis  . Singulair [Montelukast]     Facial swelling  . Clarithromycin     REACTION: GI  . Codeine     REACTION: nausea and vomiting  . Lactose     Other reaction(s): GI Upset (intolerance)  . 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 Penicillins     REACTION: per allergy testing/Patient states she has taken Augmentin without complications.   . Pregabalin     REACTION: headache, several side effects  . Sulfa Antibiotics Other (See Comments)    REACTION: Hives, wheezing  . Sulfonamide Derivatives     REACTION: Hives, wheezing  . Cefdinir Rash   Current Meds  Medication Sig  . albuterol (VENTOLIN HFA) 108 (90 Base) MCG/ACT inhaler  Inhale 2 puffs into the lungs every 6 (six) hours as needed for wheezing or shortness of breath.  . Fluticasone Furoate (ARNUITY ELLIPTA) 100 MCG/ACT AEPB Inhale 1 puff into the lungs daily.  Marland Kitchen loratadine (CLARITIN) 10 MG tablet Take 10 mg by mouth daily.  . meloxicam (MOBIC) 7.5 MG tablet Take 1 tablet (7.5 mg total) by mouth daily as needed for pain.  Marland Kitchen omeprazole (PRILOSEC) 20 MG capsule TAKE 1 CAPSULE TWICE A DAY (APPOINTMENT BEFORE FUTURE REFILLS)  . SUMAtriptan (IMITREX) 100 MG tablet TAKE 1 2 (ONE HALF) TABLET BY MOUTH AS NEEDED FOR MIGRAINE  . TURMERIC PO Take 2,000 Units by mouth daily at 8 pm.   . vitamin B-12 (CYANOCOBALAMIN) 1000 MCG tablet Take 1,000 mcg by mouth daily.  . Vitamin D, Cholecalciferol, 1000 units CAPS Take 1 capsule by mouth daily.   . vitamin E 400 UNIT capsule Take 800 Units by mouth daily.   . [DISCONTINUED] doxycycline (VIBRA-TABS) 100 MG tablet Take 1 tablet (100 mg total) by mouth 2 (two) times daily.   Immunization History  Administered Date(s) Administered  . Fluad Quad(high Dose 65+) 04/25/2019  . Influenza Whole 04/30/2004, 04/26/2007  . Influenza, High Dose Seasonal PF 05/13/2016, 05/10/2017, 04/25/2018  . Influenza,inj,quad, With Preservative 04/25/2017  . PFIZER SARS-COV-2 Vaccination 08/13/2019, 09/06/2019  . Pneumococcal Conjugate-13 04/20/2016  . Pneumococcal Polysaccharide-23 04/30/2004,  07/27/2010, 04/20/2016  . Td 07/26/2002, 11/19/2003  . Tdap 04/20/2016  . Zoster 07/27/2007  . Zoster Recombinat (Shingrix) 04/25/2018, 09/11/2018       Objective:   Physical Exam BP 122/64 (BP Location: Left Arm, Cuff Size: Normal)   Pulse 89   Temp 98 F (36.7 C) (Temporal)   Ht 5' 6.5" (1.689 m)   Wt 144 lb 9.6 oz (65.6 kg)   SpO2 98%   BMI 22.99 kg/m  GENERAL: Well-developed well-nourished woman in no acute distress.  Fully ambulatory.   HEAD: Normocephalic, atraumatic.  EYES: Pupils equal, round, reactive to light.  No scleral icterus.    MOUTH: Nose/mouth/throat not examined due to masking requirements for COVID 19. NECK: Supple. No thyromegaly. Trachea midline. No JVD.  No adenopathy. PULMONARY: Good air entry bilaterally.  No adventitious sounds.  CARDIOVASCULAR: S1 and S2. Regular rate and rhythm.  No rubs, murmurs or gallops heard. ABDOMEN: Benign. MUSCULOSKELETAL: No joint deformity, no clubbing, no edema.  NEUROLOGIC: No focal deficits, no gait disturbance, speech is fluent. SKIN: Intact,warm,dry.  Limited exam: No rashes. PSYCH: Mood is somewhat tearful.  Behavior normal.  PFTs obtained on 18 January 2020 were reviewed with the patient.  Her PFTs are essentially normal.     Assessment & Plan:     ICD-10-CM   1. Mild intermittent asthma without complication  R51.88    Continue Arnuity and as needed albuterol Continue allergen avoidance  2. Perennial allergic rhinitis with seasonal variation  J30.89    J30.2    Continue nasal hygiene as directed by ENT Continue allergen avoidance  3. Laryngopharyngeal reflux (LPR)  K21.9    Well-controlled with antireflux measures   She will need flu vaccine.  She needs high-dose which is not available to [place] today.  She will try to procure it from her primary care physician.  We will see the patient in follow-up in 6 months time she is to contact us prior to that time a new difficulties arise.  Renold Don, MD [facility]   *This note was dictated using voice recognition software/Dragon.  Despite best efforts to proofread, errors can occur which can change the meaning.  Any change was purely unintentional.

## 2020-04-28 ENCOUNTER — Encounter: Payer: Self-pay | Admitting: Endocrinology

## 2020-04-28 ENCOUNTER — Other Ambulatory Visit: Payer: Self-pay

## 2020-04-28 ENCOUNTER — Ambulatory Visit (INDEPENDENT_AMBULATORY_CARE_PROVIDER_SITE_OTHER): Payer: Medicare Other | Admitting: Endocrinology

## 2020-04-28 VITALS — BP 102/62 | HR 82 | Ht 66.5 in | Wt 143.4 lb

## 2020-04-28 DIAGNOSIS — E041 Nontoxic single thyroid nodule: Secondary | ICD-10-CM

## 2020-04-28 NOTE — Patient Instructions (Addendum)
Let's recheck the ultrasound.  you will receive a phone call, about a day and time for an appointment.   If there is no change, I would be happy to see you back here as needed.

## 2020-04-28 NOTE — Progress Notes (Signed)
Subjective:    Patient ID: Leslie Dixon, female    DOB: [birth year], 74 y.o.   MRN: [id number]  HPI Pt returns for f/u of thyroid nodule (dx'ed 2017; f/u US in 2020 was unchanged, and not big enough for bx; she is euthyroid off rx). she says she does not notice the thyroid nodule.  Pt says she does not notice any thyroid swelling, but says her dtr noticed swelling at the ant neck.  She has intermitt hoarseness.   Past Medical History:  Diagnosis Date  . Allergy   . Arthritis   . Blood dyscrasia    LEUKOPENIA- DR. CHISM - NO TREATMENT BUT IS MONITORED  . Chronic back pain   . Chronic back pain   . Fibromyalgia   . Foot drop, right    RELATED TO LUMBAR PROBLEMS  . GERD (gastroesophageal reflux disease)    PRILOSEC PRN  . History of kidney stones   . Horseshoe kidney    BILATERAL  . Leukopenia   . Migraine    OCCULAR MIGRAINES - [facility] WITH EYES  . Osteoporosis   . Pain    LUMBAR PAIN - RECENT FALL BECAUSE LEGS GAVE OUT - PT HAS HAD LUMBAR INJECTION SINCE THE FALL THAT HAS HELPED BACK PAIN  . Pain    CHRONIC RIGHT UPPER QUADRANT PAIN - RELATED TO GALLBLADDER PROBLEM  . Sciatica of right side   . Sinusitis    f/u with Dr. Janace Hoard.   . Vertigo     Past Surgical History:  Procedure Laterality Date  . ABDOMINAL HYSTERECTOMY  1983  . APPENDECTOMY  1968  . BILATERAL SALPINGOOPHORECTOMY  2000  . BREAST EXCISIONAL BIOPSY Right 1979  . CHOLECYSTECTOMY N/A 04/12/2014   Procedure: LAPAROSCOPIC CHOLECYSTECTOMY WITH INTRAOPERATIVE CHOLANGIOGRAM;  Surgeon: Kaylyn Lim, MD;  Location: [facility];  Service: General;  Laterality: N/A;  . Lumbar/cervical surgeries     CERVICAL FUSION C7-C6 WITH BONE GRAFT; C6-C5 WITH PLATE AND 4 SCREWS;  3 LUMBAR SURGERIES BUT NO FUSION  . NASAL SINUS SURGERY  2010  . right knee     ARTHROSCOPY  . TONSILLECTOMY    . TYMPANOSTOMY TUBE PLACEMENT  07/2017  . WISDOM TOOTH PULLED      Social History   Socioeconomic History  . Marital status: Married     Spouse name: Morris  . Number of children: 2  . Years of education: College  . Highest education level: Not on file  Occupational History    Comment: retired Press photographer  Tobacco Use  . Smoking status: Never Smoker  . Smokeless tobacco: Never Used  Vaping Use  . Vaping Use: Never used  Substance and Sexual Activity  . Alcohol use: No  . Drug use: No  . Sexual activity: Not Currently    Birth control/protection: Surgical  Other Topics Concern  . 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 Difficulty of Paying Living Expenses: Not hard at all  Food Insecurity: No Food Insecurity  . Worried About Charity fundraiser in the Last Year: Never true  . Ran Out of Food in the Last Year: Never true  Transportation Needs: No Transportation Needs  . Lack of Transportation (Medical): No  . Lack of Transportation (Non-Medical): No  Physical Activity: Sufficiently Active  . Days of Exercise per Week: 7 days  . Minutes of Exercise per Session: 30 min  Stress: No Stress Concern Present  . Feeling of Stress : Not at all  Social Connections:   . Frequency of Communication with Friends and Family: Not on file  . Frequency of Social Gatherings with Friends and Family: Not on file  . Attends Religious Services: Not on file  . Active Member of Clubs or  Organizations: Not on file  . Attends Archivist Meetings: Not on file  . Marital Status: Not on file  Intimate Partner Violence: Not At Risk  . Fear of Current or Ex-Partner: No  . Emotionally Abused: No  . Physically Abused: No  . Sexually Abused: No    Current Outpatient Medications on File Prior to Visit  Medication Sig Dispense Refill  . albuterol (VENTOLIN HFA) 108 (90 Base) MCG/ACT inhaler Inhale 2 puffs into the lungs every 6 (six) hours as needed for wheezing or shortness of breath. 54 g 1  . Fluticasone Furoate (ARNUITY ELLIPTA) 100 MCG/ACT AEPB Inhale 1 puff into the lungs daily. 90 each 1  . loratadine (CLARITIN) 10 MG tablet Take 10 mg by mouth daily.    . meloxicam (MOBIC) 7.5 MG tablet Take 1 tablet (7.5 mg total) by mouth daily as needed for pain. 90 tablet 1  . omeprazole (PRILOSEC) 20 MG capsule TAKE 1 CAPSULE TWICE A DAY (APPOINTMENT BEFORE FUTURE REFILLS) 180 capsule 3  . SUMAtriptan (IMITREX) 100 MG tablet TAKE 1 2 (ONE HALF) TABLET BY MOUTH AS NEEDED FOR MIGRAINE  3  . TURMERIC PO Take 2,000 Units by mouth daily at 8 pm.     . vitamin B-12 (CYANOCOBALAMIN) 1000 MCG tablet Take 1,000 mcg by mouth daily.    . Vitamin D, Cholecalciferol, 1000 units CAPS Take 1 capsule by mouth daily.     . vitamin E 400 UNIT capsule Take 800 Units by mouth daily.      No current facility-administered medications on file prior to visit.    Allergies  Allergen Reactions  . Peanut Oil Anaphylaxis  . Singulair [Montelukast]     Facial swelling  . Clarithromycin     REACTION: GI  . Codeine     REACTION: nausea and vomiting  . Lactose     Other reaction(s): GI Upset (intolerance)  . 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 Penicillins     REACTION: per allergy testing/Patient states she has taken Augmentin without complications.   . Pregabalin     REACTION: headache, several side effects  . Sulfa Antibiotics Other (See Comments)     REACTION: Hives, wheezing  . Sulfonamide Derivatives     REACTION: Hives, wheezing  . Cefdinir Rash    Family History  Problem Relation Age of Onset  . Hypertension Father   . Hyperlipidemia Father   .  Alcohol abuse Father   . Lung cancer Father   . Neurodegenerative disease Mother   . Arthritis Mother   . Asthma Brother   . Prostate cancer Brother   . Breast cancer Maternal Aunt 66  . Leukemia Maternal Grandfather   . Breast cancer Sister 60  . Thyroid disease Daughter   . Asthma Son   . Asthma Sister   . Migraines Sister     BP 102/62   Pulse 82   Ht 5' 6.5" (1.689 m)   Wt 143 lb 6.4 oz (65 kg)   SpO2 96%   BMI 22.80 kg/m    Review of Systems  Denies sob.      Objective:   Physical Exam VITAL SIGNS:  See vs page GENERAL: no distress NECK: There is no palpable thyroid enlargement.  No thyroid nodule is palpable.  No palpable lymphadenopathy at the anterior neck.      Lab Results  Component Value Date   TSH 2.39 10/17/2019   T4TOTAL 8.2 09/06/2018       Assessment & Plan:  Thyr nod, due for recheck Hoarseness: unlikely to be thyroid-related.   Patient Instructions  Let's recheck the ultrasound.  you will receive a phone call, about a day and time for an appointment.   If there is no change, I would be happy to see you back here as needed.

## 2020-05-02 DIAGNOSIS — M5137 Other intervertebral disc degeneration, lumbosacral region: Secondary | ICD-10-CM | POA: Diagnosis not present

## 2020-05-05 ENCOUNTER — Ambulatory Visit
Admission: RE | Admit: 2020-05-05 | Discharge: 2020-05-05 | Disposition: A | Discharge status: Home / Self Care | Payer: Medicare Other | Source: Ambulatory Visit | Attending: Endocrinology | Admitting: Endocrinology

## 2020-05-05 DIAGNOSIS — E041 Nontoxic single thyroid nodule: Secondary | ICD-10-CM

## 2020-05-07 DIAGNOSIS — M961 Postlaminectomy syndrome, not elsewhere classified: Secondary | ICD-10-CM | POA: Diagnosis not present

## 2020-05-07 DIAGNOSIS — M5030 Other cervical disc degeneration, unspecified cervical region: Secondary | ICD-10-CM | POA: Diagnosis not present

## 2020-05-07 DIAGNOSIS — M5412 Radiculopathy, cervical region: Secondary | ICD-10-CM | POA: Diagnosis not present

## 2020-05-07 DIAGNOSIS — M5136 Other intervertebral disc degeneration, lumbar region: Secondary | ICD-10-CM | POA: Diagnosis not present

## 2020-06-03 DIAGNOSIS — G43B0 Ophthalmoplegic migraine, not intractable: Secondary | ICD-10-CM | POA: Diagnosis not present

## 2020-06-03 DIAGNOSIS — H2012 Chronic iridocyclitis, left eye: Secondary | ICD-10-CM | POA: Diagnosis not present

## 2020-06-03 DIAGNOSIS — H04123 Dry eye syndrome of bilateral lacrimal glands: Secondary | ICD-10-CM | POA: Diagnosis not present

## 2020-07-10 ENCOUNTER — Other Ambulatory Visit: Payer: Self-pay | Admitting: Pulmonary Disease

## 2020-07-10 ENCOUNTER — Telehealth: Payer: Self-pay

## 2020-07-10 NOTE — Telephone Encounter (Addendum)
Received call from [facility] with [facility], who states that she received a renewal for Arnuity. Patient last refilled this medication 11 months ago and patient has a lactose allergy and Lactose is in [place].  Lm to verify that she is still taking this medication.   Judson Roch would like a call back at [phone]  Ref #  [id number]

## 2020-07-11 NOTE — Telephone Encounter (Signed)
Yes may continue to take Arnuity.

## 2020-07-11 NOTE — Telephone Encounter (Signed)
I have spoken to April with [facility] and relayed below message.  Nothing further needed.

## 2020-07-11 NOTE — Telephone Encounter (Signed)
Spoke to patient, who stated that she is still taking Arnuity daily.  She received 52mo supply at once, therefore she is just now needing refills.  Dr. Patsey Berthold, please advise if okay to continue with lactose allergy. Thanks

## 2020-07-14 DIAGNOSIS — K219 Gastro-esophageal reflux disease without esophagitis: Secondary | ICD-10-CM | POA: Diagnosis not present

## 2020-07-14 DIAGNOSIS — G43909 Migraine, unspecified, not intractable, without status migrainosus: Secondary | ICD-10-CM | POA: Diagnosis not present

## 2020-07-14 DIAGNOSIS — Z13220 Encounter for screening for lipoid disorders: Secondary | ICD-10-CM | POA: Diagnosis not present

## 2020-07-14 DIAGNOSIS — E538 Deficiency of other specified B group vitamins: Secondary | ICD-10-CM | POA: Diagnosis not present

## 2020-07-14 DIAGNOSIS — M810 Age-related osteoporosis without current pathological fracture: Secondary | ICD-10-CM | POA: Diagnosis not present

## 2020-07-14 DIAGNOSIS — Z79899 Other long term (current) drug therapy: Secondary | ICD-10-CM | POA: Diagnosis not present

## 2020-07-14 DIAGNOSIS — M5030 Other cervical disc degeneration, unspecified cervical region: Secondary | ICD-10-CM | POA: Diagnosis not present

## 2020-07-14 DIAGNOSIS — R946 Abnormal results of thyroid function studies: Secondary | ICD-10-CM | POA: Diagnosis not present

## 2020-07-14 DIAGNOSIS — R7309 Other abnormal glucose: Secondary | ICD-10-CM | POA: Diagnosis not present

## 2020-07-14 DIAGNOSIS — M5136 Other intervertebral disc degeneration, lumbar region: Secondary | ICD-10-CM | POA: Diagnosis not present

## 2020-07-15 DIAGNOSIS — M8589 Other specified disorders of bone density and structure, multiple sites: Secondary | ICD-10-CM | POA: Diagnosis not present

## 2020-07-15 DIAGNOSIS — M5030 Other cervical disc degeneration, unspecified cervical region: Secondary | ICD-10-CM | POA: Diagnosis not present

## 2020-07-15 DIAGNOSIS — R7309 Other abnormal glucose: Secondary | ICD-10-CM | POA: Diagnosis not present

## 2020-07-15 DIAGNOSIS — K219 Gastro-esophageal reflux disease without esophagitis: Secondary | ICD-10-CM | POA: Diagnosis not present

## 2020-07-15 DIAGNOSIS — E538 Deficiency of other specified B group vitamins: Secondary | ICD-10-CM | POA: Diagnosis not present

## 2020-07-15 DIAGNOSIS — Z79899 Other long term (current) drug therapy: Secondary | ICD-10-CM | POA: Diagnosis not present

## 2020-07-15 DIAGNOSIS — Z13220 Encounter for screening for lipoid disorders: Secondary | ICD-10-CM | POA: Diagnosis not present

## 2020-07-15 DIAGNOSIS — M5136 Other intervertebral disc degeneration, lumbar region: Secondary | ICD-10-CM | POA: Diagnosis not present

## 2020-07-15 DIAGNOSIS — R946 Abnormal results of thyroid function studies: Secondary | ICD-10-CM | POA: Diagnosis not present

## 2020-07-15 DIAGNOSIS — M810 Age-related osteoporosis without current pathological fracture: Secondary | ICD-10-CM | POA: Diagnosis not present

## 2020-07-21 DIAGNOSIS — R946 Abnormal results of thyroid function studies: Secondary | ICD-10-CM | POA: Diagnosis not present

## 2020-07-21 DIAGNOSIS — G43909 Migraine, unspecified, not intractable, without status migrainosus: Secondary | ICD-10-CM | POA: Diagnosis not present

## 2020-07-21 DIAGNOSIS — J45909 Unspecified asthma, uncomplicated: Secondary | ICD-10-CM | POA: Diagnosis not present

## 2020-07-21 DIAGNOSIS — K219 Gastro-esophageal reflux disease without esophagitis: Secondary | ICD-10-CM | POA: Diagnosis not present

## 2020-07-21 DIAGNOSIS — R7309 Other abnormal glucose: Secondary | ICD-10-CM | POA: Diagnosis not present

## 2020-07-21 DIAGNOSIS — Z0000 Encounter for general adult medical examination without abnormal findings: Secondary | ICD-10-CM | POA: Diagnosis not present

## 2020-07-21 DIAGNOSIS — Z9109 Other allergy status, other than to drugs and biological substances: Secondary | ICD-10-CM | POA: Diagnosis not present

## 2020-07-21 DIAGNOSIS — E538 Deficiency of other specified B group vitamins: Secondary | ICD-10-CM | POA: Diagnosis not present

## 2020-07-21 DIAGNOSIS — M5030 Other cervical disc degeneration, unspecified cervical region: Secondary | ICD-10-CM | POA: Diagnosis not present

## 2020-07-21 DIAGNOSIS — E559 Vitamin D deficiency, unspecified: Secondary | ICD-10-CM | POA: Diagnosis not present

## 2020-07-21 DIAGNOSIS — M5136 Other intervertebral disc degeneration, lumbar region: Secondary | ICD-10-CM | POA: Diagnosis not present

## 2020-07-28 DIAGNOSIS — J310 Chronic rhinitis: Secondary | ICD-10-CM | POA: Diagnosis not present

## 2020-07-28 DIAGNOSIS — J0100 Acute maxillary sinusitis, unspecified: Secondary | ICD-10-CM | POA: Diagnosis not present

## 2020-07-28 DIAGNOSIS — K219 Gastro-esophageal reflux disease without esophagitis: Secondary | ICD-10-CM | POA: Diagnosis not present

## 2020-07-28 DIAGNOSIS — H6981 Other specified disorders of Eustachian tube, right ear: Secondary | ICD-10-CM | POA: Diagnosis not present

## 2020-08-26 DIAGNOSIS — J301 Allergic rhinitis due to pollen: Secondary | ICD-10-CM | POA: Diagnosis not present

## 2020-09-10 DIAGNOSIS — Z1211 Encounter for screening for malignant neoplasm of colon: Secondary | ICD-10-CM | POA: Diagnosis not present

## 2020-09-10 DIAGNOSIS — Z1212 Encounter for screening for malignant neoplasm of rectum: Secondary | ICD-10-CM | POA: Diagnosis not present

## 2020-09-18 LAB — COLOGUARD: COLOGUARD: NEGATIVE

## 2020-10-17 ENCOUNTER — Telehealth: Payer: Self-pay | Admitting: Pulmonary Disease

## 2020-10-17 MED ORDER — PREDNISONE 10 MG (21) PO TBPK: ORAL_TABLET | ORAL | 0 refills | Status: DC

## 2020-10-17 MED ORDER — AZITHROMYCIN 250 MG PO TABS: ORAL_TABLET | ORAL | 0 refills | Status: AC

## 2020-10-17 NOTE — Telephone Encounter (Signed)
Patient is aware of below recommendations.  She voiced her understanding and had no further questions.  Rx for prednisone and zpak has been sent to preferred pharmacy.  allergy to clarithromycin. Patient stated that she has taken zpak previously without any GI issues.  appt scheduled for 12/29/2020 at 10:00. Nothing further needed at this time.

## 2020-10-17 NOTE — Telephone Encounter (Signed)
Called and spoke to patient.  Patient reports of non prod cough, sinus pressure, headache,  increased sob and wheezing x1w Denies f/c/s or additional sx.  She is using Claritin daily, Arnuity and nasacort with some relief in sx.  Fully vaccinated against covid and flu.   Dr. Patsey Berthold, please  Advise. Thanks

## 2020-10-17 NOTE — Telephone Encounter (Signed)
Lets send a prednisone taper pack and a z pack. Make sure she has F/U apt with us.within 2 mo. Call sooner if no better. Make sure she is still on the Arnuity

## 2020-11-12 DIAGNOSIS — G43B0 Ophthalmoplegic migraine, not intractable: Secondary | ICD-10-CM | POA: Diagnosis not present

## 2020-11-12 DIAGNOSIS — H532 Diplopia: Secondary | ICD-10-CM | POA: Diagnosis not present

## 2020-11-12 DIAGNOSIS — H40031 Anatomical narrow angle, right eye: Secondary | ICD-10-CM | POA: Diagnosis not present

## 2020-11-12 DIAGNOSIS — M316 Other giant cell arteritis: Secondary | ICD-10-CM | POA: Diagnosis not present

## 2020-11-14 DIAGNOSIS — M316 Other giant cell arteritis: Secondary | ICD-10-CM | POA: Diagnosis not present

## 2020-11-27 DIAGNOSIS — G43909 Migraine, unspecified, not intractable, without status migrainosus: Secondary | ICD-10-CM | POA: Diagnosis not present

## 2020-12-03 DIAGNOSIS — L710 Perioral dermatitis: Secondary | ICD-10-CM | POA: Diagnosis not present

## 2020-12-03 DIAGNOSIS — L218 Other seborrheic dermatitis: Secondary | ICD-10-CM | POA: Diagnosis not present

## 2020-12-05 DIAGNOSIS — H40031 Anatomical narrow angle, right eye: Secondary | ICD-10-CM | POA: Diagnosis not present

## 2020-12-05 DIAGNOSIS — M316 Other giant cell arteritis: Secondary | ICD-10-CM | POA: Diagnosis not present

## 2020-12-05 DIAGNOSIS — G43B0 Ophthalmoplegic migraine, not intractable: Secondary | ICD-10-CM | POA: Diagnosis not present

## 2020-12-05 DIAGNOSIS — H35363 Drusen (degenerative) of macula, bilateral: Secondary | ICD-10-CM | POA: Diagnosis not present

## 2020-12-05 DIAGNOSIS — H2511 Age-related nuclear cataract, right eye: Secondary | ICD-10-CM | POA: Diagnosis not present

## 2020-12-15 DIAGNOSIS — G43109 Migraine with aura, not intractable, without status migrainosus: Secondary | ICD-10-CM | POA: Diagnosis not present

## 2020-12-30 ENCOUNTER — Encounter: Payer: Self-pay | Admitting: Pulmonary Disease

## 2020-12-30 ENCOUNTER — Ambulatory Visit (INDEPENDENT_AMBULATORY_CARE_PROVIDER_SITE_OTHER): Payer: Medicare Other | Admitting: Pulmonary Disease

## 2020-12-30 ENCOUNTER — Other Ambulatory Visit: Payer: Self-pay

## 2020-12-30 VITALS — BP 116/68 | HR 82 | Temp 97.0°F | Ht 66.5 in | Wt 145.4 lb

## 2020-12-30 DIAGNOSIS — J4520 Mild intermittent asthma, uncomplicated: Secondary | ICD-10-CM

## 2020-12-30 DIAGNOSIS — K219 Gastro-esophageal reflux disease without esophagitis: Secondary | ICD-10-CM

## 2020-12-30 DIAGNOSIS — J3089 Other allergic rhinitis: Secondary | ICD-10-CM | POA: Diagnosis not present

## 2020-12-30 DIAGNOSIS — J302 Other seasonal allergic rhinitis: Secondary | ICD-10-CM | POA: Diagnosis not present

## 2020-12-30 MED ORDER — ARNUITY ELLIPTA 100 MCG/ACT IN AEPB: 1.0000 | INHALATION_SPRAY | Freq: Every day | RESPIRATORY_TRACT | 4 refills | Status: DC

## 2020-12-30 NOTE — Patient Instructions (Signed)
Continue using Arnuity daily  Continue using albuterol as needed  We will see him in follow-up in 6 months time call sooner should any new problems arise

## 2020-12-30 NOTE — Progress Notes (Signed)
Subjective:    Patient ID: Leslie Dixon, female    DOB: 04-21-1946, 75 y.o.   MRN: [id number] Chief Complaint  Patient presents with   Follow-up    No current sx.    HPI Patient is a 75 year old-never smoker with mild intermittent asthma.  We last saw her here on 04/15/2020.  She has not had any flareups or difficulties in the interim.  This is a scheduled follow-up visit.  She is currently maintained on Arnuity Ellipta 100 mcg daily and as needed Ventolin.  She has been doing very well and her cough has been very well controlled.  She has sinus issues for which she follows with Dr. Pryor Ochoa.  She has not had any recent flareups.  Her laryngopharyngeal reflux is well controlled.  Overall she feels well and looks well.   She does not endorse fevers, chills or sweats.  No recent cough or sputum production.  No hemoptysis.  Her sinus issues have been in check.  She wears a mask when she does her gardening.  No recent issues with laryngopharyngeal reflux.  No recent issues with hoarseness.   Review of Systems A 10 point review of systems was performed and it is as noted above otherwise negative.  Patient Active Problem List   Diagnosis Date Noted   Mild intermittent asthma without complication 15/40/0867   Rib contusion, right, initial encounter 05/29/2019   Elevated HDL 05/31/2018   Panic attack as reaction to stress 05/31/2018   Lumbar post-laminectomy syndrome 01/24/2018   Gastroesophageal reflux disease 12/28/2017   Chronic neck pain 12/28/2017   Osteoarthritis 12/28/2017   Cervical radiculopathy 10/24/2017   Chronic pain 10/19/2017   DDD (degenerative disc disease), cervical 10/19/2017   Irritable bowel syndrome with diarrhea 09/29/2017   Migraine without status migrainosus, not intractable 09/29/2017   Family history of breast cancer- sister at age 82- new onset 02/23/2017   History of thyroid nodule- R side ( Dr Loanne Drilling eval in 10/17) 02/23/2017   Status post lumbar surgery-  foot drop R foot 02/23/2017   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   Right thyroid nodule 05/28/2016   Intolerance to cold 05/02/2016   History of non anemic vitamin B12 deficiency 05/02/2016   Environmental and seasonal allergies 04/20/2016   h/o GAD (had panic in past) 02/12/2016   GERD (gastroesophageal reflux disease)    Osteoporosis    Chronic back pain    Idiopathic benign Leukopenia    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   HYPOKALEMIA 01/03/2008   Allergic rhinitis due to pollen 12/13/2007   VOCAL CORD DISORDER 06/09/2007   LACTOSE INTOLERANCE 03/07/2007   Ocular migraine 03/07/2007   CARPAL TUNNEL SYNDROME 03/07/2007   VARICOSE VEIN 03/07/2007   NEPHROLITHIASIS, HX OF 03/07/2007   Social History   Tobacco Use   Smoking status: Never   Smokeless tobacco: Never  Substance Use Topics   Alcohol use: No   Allergies  Allergen Reactions   Peanut Oil Anaphylaxis   Singulair [Montelukast]     Facial swelling   Clarithromycin     REACTION: GI   Codeine     REACTION: nausea and vomiting   Lactose     Other reaction(s): GI Upset (intolerance)   Other     GLUTEN RESTRICTED LACTOSE INTOLERANT ALLERGIC TO NUTS AND CORN   Penicillins     REACTION: per allergy testing/Patient states she has taken Augmentin without complications.    Pregabalin     REACTION: headache, several side effects  Sulfa Antibiotics Other (See Comments)    REACTION: Hives, wheezing   Sulfonamide Derivatives     REACTION: Hives, wheezing   Cefdinir Rash   Current Meds  Medication Sig   albuterol (VENTOLIN HFA) 108 (90 Base) MCG/ACT inhaler Inhale 2 puffs into the lungs every 6 (six) hours as needed for wheezing or shortness of breath.   Fluticasone Furoate (ARNUITY ELLIPTA) 100 MCG/ACT AEPB Inhale 1 puff into the lungs daily.   loratadine (CLARITIN) 10 MG tablet Take 10 mg by mouth daily.   omeprazole (PRILOSEC) 20 MG capsule TAKE 1 CAPSULE TWICE A DAY (APPOINTMENT BEFORE FUTURE REFILLS)    SUMAtriptan (IMITREX) 100 MG tablet TAKE 1 2 (ONE HALF) TABLET BY MOUTH AS NEEDED FOR MIGRAINE   TURMERIC PO Take 2,000 Units by mouth daily at 8 pm.    vitamin B-12 (CYANOCOBALAMIN) 1000 MCG tablet Take 1,000 mcg by mouth daily.   Vitamin D, Cholecalciferol, 1000 units CAPS Take 1 capsule by mouth daily.    vitamin E 400 UNIT capsule Take 800 Units by mouth daily.    [DISCONTINUED] ARNUITY ELLIPTA 100 MCG/ACT AEPB USE 1 INHALATION DAILY   [DISCONTINUED] meloxicam (MOBIC) 7.5 MG tablet Take 1 tablet (7.5 mg total) by mouth daily as needed for pain.   [DISCONTINUED] predniSONE (STERAPRED UNI-PAK 21 TAB) 10 MG (21) TBPK tablet Use as directed   Immunization History  Administered Date(s) Administered   Fluad Quad(high Dose 65+) 04/25/2019   Influenza Whole 04/30/2004, 04/26/2007   Influenza, High Dose Seasonal PF 05/13/2016, 05/10/2017, 04/25/2018, 04/15/2020   Influenza,inj,quad, With Preservative 04/25/2017   PFIZER(Purple Top)SARS-COV-2 Vaccination 08/13/2019, 09/06/2019, 05/16/2020   Pneumococcal Conjugate-13 04/20/2016   Pneumococcal Polysaccharide-23 04/30/2004, 07/27/2010, 04/20/2016   Td 07/26/2002, 11/19/2003   Tdap 04/20/2016   Zoster Recombinat (Shingrix) 04/25/2018, 09/11/2018   Zoster, Live 07/27/2007       Objective:   Physical Exam BP 116/68 (BP Location: Left Arm, Cuff Size: Normal)   Pulse 82   Temp (!) 97 F (36.1 C) (Temporal)   Ht 5' 6.5" (1.689 m)   Wt 145 lb 6.4 oz (66 kg)   SpO2 98%   BMI 23.12 kg/m   GENERAL: Well-developed well-nourished woman in no acute distress.  Fully ambulatory.   HEAD: Normocephalic, atraumatic. EYES: Pupils equal, round, reactive to light.  No scleral icterus. MOUTH: Nose/mouth/throat not examined due to masking requirements for COVID 19. NECK: Supple. No thyromegaly. Trachea midline. No JVD.  No adenopathy. PULMONARY: Good air entry bilaterally.  No adventitious sounds. CARDIOVASCULAR: S1 and S2. Regular rate and rhythm.  No  rubs, murmurs or gallops heard. ABDOMEN: Benign. MUSCULOSKELETAL: No joint deformity, no clubbing, no edema. NEUROLOGIC: No focal deficits, no gait disturbance, speech is fluent. SKIN: Intact,warm,dry.  Limited exam: No rashes. PSYCH: Mood and behavior normal     Assessment & Plan:     ICD-10-CM   1. Mild intermittent asthma without complication  S96.28    Continue Arnuity 100 mcg daily Continue as needed albuterol Follow up 6 months    2. Perennial allergic rhinitis with seasonal variation  J30.89    J30.2    Well-controlled with nasal hygiene Follows with ENT for this and sinus issues    3. Laryngopharyngeal reflux (LPR)  K21.9    No recent issues with ongoing use of PPI  Follows antireflux measures     Meds ordered this encounter  Medications   Fluticasone Furoate (ARNUITY ELLIPTA) 100 MCG/ACT AEPB    Sig: Inhale 1 puff into the lungs  daily.    Dispense:  90 each    Refill:  4   We will see the patient in follow-up in 6 months time.  She is very well compensated.  She is to contact us prior to follow-up time should any new difficulties arise.  Renold Don, MD [facility]   *This note was dictated using voice recognition software/Dragon.  Despite best efforts to proofread, errors can occur which can change the meaning.  Any change was purely unintentional.

## 2021-01-06 DIAGNOSIS — M5416 Radiculopathy, lumbar region: Secondary | ICD-10-CM | POA: Diagnosis not present

## 2021-01-12 DIAGNOSIS — L218 Other seborrheic dermatitis: Secondary | ICD-10-CM | POA: Diagnosis not present

## 2021-01-16 ENCOUNTER — Encounter: Payer: Self-pay | Admitting: Pulmonary Disease

## 2021-01-22 DIAGNOSIS — N1339 Other hydronephrosis: Secondary | ICD-10-CM | POA: Diagnosis not present

## 2021-01-22 DIAGNOSIS — Q631 Lobulated, fused and horseshoe kidney: Secondary | ICD-10-CM | POA: Diagnosis not present

## 2021-01-22 DIAGNOSIS — N200 Calculus of kidney: Secondary | ICD-10-CM | POA: Diagnosis not present

## 2021-01-28 DIAGNOSIS — Z23 Encounter for immunization: Secondary | ICD-10-CM | POA: Diagnosis not present

## 2021-02-04 ENCOUNTER — Other Ambulatory Visit: Payer: Self-pay | Admitting: Family Medicine

## 2021-02-04 DIAGNOSIS — Z1231 Encounter for screening mammogram for malignant neoplasm of breast: Secondary | ICD-10-CM

## 2021-02-12 DIAGNOSIS — M5136 Other intervertebral disc degeneration, lumbar region: Secondary | ICD-10-CM | POA: Diagnosis not present

## 2021-02-12 DIAGNOSIS — M961 Postlaminectomy syndrome, not elsewhere classified: Secondary | ICD-10-CM | POA: Diagnosis not present

## 2021-02-12 DIAGNOSIS — M5030 Other cervical disc degeneration, unspecified cervical region: Secondary | ICD-10-CM | POA: Diagnosis not present

## 2021-02-26 ENCOUNTER — Encounter: Payer: Self-pay | Admitting: Gastroenterology

## 2021-02-26 DIAGNOSIS — R1013 Epigastric pain: Secondary | ICD-10-CM | POA: Diagnosis not present

## 2021-02-26 DIAGNOSIS — K219 Gastro-esophageal reflux disease without esophagitis: Secondary | ICD-10-CM | POA: Diagnosis not present

## 2021-03-09 DIAGNOSIS — R1013 Epigastric pain: Secondary | ICD-10-CM | POA: Diagnosis not present

## 2021-03-09 DIAGNOSIS — E538 Deficiency of other specified B group vitamins: Secondary | ICD-10-CM | POA: Diagnosis not present

## 2021-03-09 DIAGNOSIS — K219 Gastro-esophageal reflux disease without esophagitis: Secondary | ICD-10-CM | POA: Diagnosis not present

## 2021-03-09 DIAGNOSIS — E559 Vitamin D deficiency, unspecified: Secondary | ICD-10-CM | POA: Diagnosis not present

## 2021-03-09 DIAGNOSIS — R946 Abnormal results of thyroid function studies: Secondary | ICD-10-CM | POA: Diagnosis not present

## 2021-03-09 DIAGNOSIS — R7309 Other abnormal glucose: Secondary | ICD-10-CM | POA: Diagnosis not present

## 2021-03-09 DIAGNOSIS — Z79899 Other long term (current) drug therapy: Secondary | ICD-10-CM | POA: Diagnosis not present

## 2021-03-31 ENCOUNTER — Ambulatory Visit
Admission: RE | Admit: 2021-03-31 | Discharge: 2021-03-31 | Disposition: A | Discharge status: Home / Self Care | Payer: Medicare Other | Source: Ambulatory Visit | Attending: Family Medicine | Admitting: Family Medicine

## 2021-03-31 ENCOUNTER — Other Ambulatory Visit: Payer: Self-pay

## 2021-03-31 DIAGNOSIS — Z1231 Encounter for screening mammogram for malignant neoplasm of breast: Secondary | ICD-10-CM | POA: Diagnosis not present

## 2021-04-07 ENCOUNTER — Encounter: Payer: Self-pay | Admitting: Gastroenterology

## 2021-04-07 ENCOUNTER — Ambulatory Visit (INDEPENDENT_AMBULATORY_CARE_PROVIDER_SITE_OTHER): Payer: Medicare Other | Admitting: Gastroenterology

## 2021-04-07 VITALS — BP 100/60 | HR 63 | Ht 66.5 in | Wt 141.5 lb

## 2021-04-07 DIAGNOSIS — R1084 Generalized abdominal pain: Secondary | ICD-10-CM | POA: Diagnosis not present

## 2021-04-07 DIAGNOSIS — K2100 Gastro-esophageal reflux disease with esophagitis, without bleeding: Secondary | ICD-10-CM

## 2021-04-07 MED ORDER — OMEPRAZOLE 20 MG PO CPDR: 20.0000 mg | DELAYED_RELEASE_CAPSULE | Freq: Every morning | ORAL | 3 refills | Status: DC

## 2021-04-07 NOTE — Progress Notes (Addendum)
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 Thankfully, her weight has been stable.     PLAN: - Continue omeprazole 40 mg QAM - EGD with biopsies +/- dilation - CMP and CBC if not recently performed by PMD - Consider esophageal manometry after EGD for further evaluation - Low threshold for abd/pelvic imaging   HPI: Leslie KOZLOSKIis a 75y.o. female who is self-referred for further evaluation of abdominal pain. The history is obtained through the patient, review of her electronic health record, and prior records from [facility] She has a history of mild intermittent asthma on arnuity, seasonal allergies, LPR, ocular migraines, benign thyroid nodule, osteoporosis. She reports a history of IBS and cholecystectomy. 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   Developed severe, sharp epigastric abdominal pain in June 2022. Associated nausea and bloating. Initially felt like a heart attack. She saw her primary care provide who increased her omeprazole to BID  and added famotidine 20 mg QHS. She was also put on a strict reflux diet.  After one month, she reduced her omeprazole to 40 mg QD and stopped her famotidine. Feels like her symptoms remain stable.  No change in bowel habits. No  constipation or diarrhea.   She has been on melaxicam in the last, last one year ago.  She was concerned about a bleeding ulcer because of the severity of her symptoms.   She has a longstanding history of dysphagia to bread. Some choking. Previously on tumeric. No dysphagia to liquids. Symptoms improved after dilation with Dr. MWatt Climesin 2019.   Prior evaluation: Upper endoscopy with Dr. 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  There was no H. pylori.  No dysplasia.  She had a normal upper endoscopy with Dr. EOletta Lamasin 2015.  No biopsies were obtained at that time.  Screening colonoscopy with Dr. EOletta Lamas11/13/2012 revealed internal hemorrhoids  Cologuard negative 09/10/2020  Labs 12/07/19 showed a normal BMP. Unable to located additional labs.   There is a family history of colon polyps (brother). No other family history of colon cancer or polyps.   Past Medical History:  Diagnosis Date   Allergy    Arthritis    Blood dyscrasia    LEUKOPENIA- DR. CHISM - NO TREATMENT BUT IS MONITORED   Chronic back pain    Chronic back pain    Fibromyalgia    Foot drop, right    RELATED TO LUMBAR PROBLEMS   GERD (gastroesophageal reflux disease)    PRILOSEC PRN   History of kidney stones    Horseshoe kidney    BILATERAL   Leukopenia    Migraine  OCCULAR MIGRAINES - AURA WITH EYES   Osteoporosis    Pain    LUMBAR PAIN - RECENT FALL BECAUSE LEGS GAVE OUT - PT HAS HAD LUMBAR INJECTION SINCE THE FALL THAT HAS HELPED BACK PAIN   Pain    CHRONIC RIGHT UPPER QUADRANT PAIN - RELATED TO GALLBLADDER PROBLEM   Sciatica of right side    Sinusitis    f/u with Dr. Janace Hoard.    Vertigo     Past Surgical History:   Procedure Laterality Date   ABDOMINAL HYSTERECTOMY  1983   APPENDECTOMY  1968   BILATERAL SALPINGOOPHORECTOMY  2000   BREAST EXCISIONAL BIOPSY Right 1979   CHOLECYSTECTOMY N/A 04/12/2014   Procedure: LAPAROSCOPIC CHOLECYSTECTOMY WITH INTRAOPERATIVE CHOLANGIOGRAM;  Surgeon: Kaylyn Lim, MD;  Location: [facility];  Service: General;  Laterality: N/A;   Lumbar/cervical surgeries     CERVICAL FUSION C7-C6 WITH BONE GRAFT; C6-C5 WITH PLATE AND 4 SCREWS;  3 LUMBAR SURGERIES BUT NO FUSION   NASAL SINUS SURGERY  2010   right knee     ARTHROSCOPY   TONSILLECTOMY     TYMPANOSTOMY TUBE PLACEMENT  07/2017   WISDOM TOOTH PULLED      Prior to Admission medications   Medication Sig Start Date End Date Taking? Authorizing Provider  albuterol (VENTOLIN HFA) 108 (90 Base) MCG/ACT inhaler Inhale 2 puffs into the lungs every 6 (six) hours as needed for wheezing or shortness of breath. 02/13/20  Yes Tyler Pita, MD  Fluticasone Furoate (ARNUITY ELLIPTA) 100 MCG/ACT AEPB Inhale 1 puff into the lungs daily. 12/30/20  Yes Tyler Pita, MD  loratadine (CLARITIN) 10 MG tablet Take 10 mg by mouth daily.   Yes [provider]  omeprazole (PRILOSEC) 20 MG capsule TAKE 1 CAPSULE TWICE A DAY (APPOINTMENT BEFORE FUTURE REFILLS) 03/17/20  Yes Tyler Pita, MD  SUMAtriptan (IMITREX) 100 MG tablet TAKE 1 2 (ONE HALF) TABLET BY MOUTH AS NEEDED FOR MIGRAINE 02/24/18  Yes [provider]  triamcinolone (NASACORT ALLERGY 24HR) 55 MCG/ACT AERO nasal inhaler Place 2 sprays into the nose daily.   Yes [provider]  TURMERIC PO Take 2,000 Units by mouth daily at 8 pm.    Yes [provider]  vitamin B-12 (CYANOCOBALAMIN) 1000 MCG tablet Take 1,000 mcg by mouth daily.   Yes [provider]  Vitamin D, Cholecalciferol, 1000 units CAPS Take 1 capsule by mouth daily.    Yes [provider]  vitamin E 400 UNIT capsule Take 800 Units by mouth daily.    Yes [provider]    Current Outpatient Medications  Medication Sig Dispense Refill   albuterol (VENTOLIN HFA) 108 (90 Base) MCG/ACT inhaler Inhale 2 puffs into the lungs every 6 (six) hours as needed for wheezing or shortness of breath. 54 g 1   Fluticasone Furoate (ARNUITY ELLIPTA) 100 MCG/ACT AEPB Inhale 1 puff into the lungs daily. 90 each 4   loratadine (CLARITIN) 10 MG tablet Take 10 mg by mouth daily.     SUMAtriptan (IMITREX) 100 MG tablet TAKE 1 2 (ONE HALF) TABLET BY MOUTH AS NEEDED FOR MIGRAINE  3   triamcinolone (NASACORT ALLERGY 24HR) 55 MCG/ACT AERO nasal inhaler Place 2 sprays into the nose daily.     TURMERIC PO Take 2,000 Units by mouth daily at 8 pm.      vitamin B-12 (CYANOCOBALAMIN) 1000 MCG tablet Take 1,000 mcg by mouth daily.     Vitamin D, Cholecalciferol, 1000 units CAPS Take  1 capsule by mouth daily.      vitamin E 400 UNIT capsule Take 800 Units by mouth daily.      omeprazole (PRILOSEC) 20 MG capsule Take 1 capsule (20 mg total) by mouth every morning. 90 capsule 3   No current facility-administered medications for this visit.    Allergies as of 04/07/2021 - Review Complete 04/07/2021  Allergen Reaction Noted   Peanut oil Anaphylaxis 05/06/2011   Singulair [montelukast]  09/06/2018   Clarithromycin  03/07/2007   Codeine  03/07/2007   Lactose  05/06/2011   Other  04/05/2014   Penicillins  03/07/2007   Pregabalin  06/09/2007   Sulfa antibiotics Other (See Comments) 10/02/2014   Sulfonamide derivatives  03/07/2007   Cefdinir Rash 09/30/2016    Family History  Problem Relation Age of Onset   Hypertension Father    Hyperlipidemia Father    Alcohol abuse Father    Lung cancer Father    Neurodegenerative disease Mother    Arthritis Mother    Asthma Brother    Prostate cancer Brother    Breast cancer Maternal Aunt 26   Leukemia Maternal Grandfather    Breast cancer Sister 71   Thyroid disease Daughter    Asthma Son    Asthma Sister    Migraines  Sister      Review of Systems: 12 system ROS is negative except as noted above with the addition of allergies, arthritis, back pain, asthma, headaches, and muscle pains.   Physical Exam: General:   Alert,  well-nourished, pleasant and cooperative in NAD Head:  Normocephalic and atraumatic. Eyes:  Sclera clear, no icterus.   Conjunctiva pink. Lungs:  Clear throughout to auscultation.   No wheezes. Heart:  Regular rate and rhythm; no murmurs. Abdomen:  Soft, nontender, nondistended, normal bowel sounds, no rebound or guarding. No hepatosplenomegaly.   Rectal:  Deferred  Msk:  Symmetrical. No boney deformities LAD: No inguinal or umbilical LAD Extremities:  No clubbing or edema. Neurologic:  Alert and  oriented x4;  grossly nonfocal Skin:  Intact without significant lesions or rashes. Psych:  Alert and cooperative. Normal mood and affect.    Leslie Fetterolf L. Tarri Glenn, MD, MPH 04/10/2021, 12:00 PM

## 2021-04-07 NOTE — Patient Instructions (Addendum)
It was my pleasure to provide care to you today. Based on our discussion, I am providing you with my recommendations below:  RECOMMENDATION(S):   PRESCRIPTION MEDICATION(S):   We have sent the following medication(s) to your pharmacy:  Omeprazole  NOTE: If your medication(s) requires a PRIOR AUTHORIZATION, we will receive notification from your pharmacy. Once received, the process to submit for approval may take up to 7-10 business days. You will be contacted about any denials we have received from your insurance company as well as alternatives recommended by your provider.  ENDOSCOPY:   You have been scheduled for an endoscopy. Please follow written instructions given to you at your visit today.  INHALERS:   If you use inhalers (even only as needed), please bring them with you on the day of your procedure.  FOLLOW UP:  After your procedure, you will receive a call from my office staff regarding my recommendation for follow up.  BMI:  If you are age 66 or older, your body mass index should be between 23-30. Your Body mass index is 22.5 kg/m. If this is out of the aforementioned range listed, please consider follow up with your Primary Care Provider.  MY CHART:  The [facility] providers would like to encourage you to use [facility] to communicate with providers for non-urgent requests or questions.  Due to long hold times on the telephone, sending your provider a message by [facility] may be a faster and more efficient way to get a response.  Please allow 48 business hours for a response.  Please remember that this is for non-urgent requests.   Thank you for trusting me with your gastrointestinal care!    Thornton Park, MD, MPH

## 2021-04-10 ENCOUNTER — Encounter: Payer: Self-pay | Admitting: Gastroenterology

## 2021-04-20 ENCOUNTER — Ambulatory Visit (AMBULATORY_SURGERY_CENTER): Payer: Medicare Other | Admitting: Gastroenterology

## 2021-04-20 ENCOUNTER — Other Ambulatory Visit: Payer: Self-pay

## 2021-04-20 ENCOUNTER — Encounter: Payer: Self-pay | Admitting: Gastroenterology

## 2021-04-20 VITALS — BP 128/61 | HR 60 | Temp 97.8°F | Resp 12 | Ht 66.0 in | Wt 141.0 lb

## 2021-04-20 DIAGNOSIS — K319 Disease of stomach and duodenum, unspecified: Secondary | ICD-10-CM

## 2021-04-20 DIAGNOSIS — K31A11 Gastric intestinal metaplasia without dysplasia, involving the antrum: Secondary | ICD-10-CM | POA: Diagnosis not present

## 2021-04-20 DIAGNOSIS — K2970 Gastritis, unspecified, without bleeding: Secondary | ICD-10-CM | POA: Diagnosis not present

## 2021-04-20 DIAGNOSIS — R1310 Dysphagia, unspecified: Secondary | ICD-10-CM

## 2021-04-20 DIAGNOSIS — K2100 Gastro-esophageal reflux disease with esophagitis, without bleeding: Secondary | ICD-10-CM | POA: Diagnosis not present

## 2021-04-20 DIAGNOSIS — M797 Fibromyalgia: Secondary | ICD-10-CM | POA: Diagnosis not present

## 2021-04-20 DIAGNOSIS — K31A0 Gastric intestinal metaplasia, unspecified: Secondary | ICD-10-CM | POA: Diagnosis not present

## 2021-04-20 DIAGNOSIS — J45909 Unspecified asthma, uncomplicated: Secondary | ICD-10-CM | POA: Diagnosis not present

## 2021-04-20 DIAGNOSIS — K219 Gastro-esophageal reflux disease without esophagitis: Secondary | ICD-10-CM | POA: Diagnosis not present

## 2021-04-20 MED ORDER — SODIUM CHLORIDE 0.9 % IV SOLN: 500.0000 mL | Freq: Once | INTRAVENOUS | Status: DC

## 2021-04-20 NOTE — Progress Notes (Signed)
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 female presents for endoscopic evaluation and possible dilation for epigastric pain, nausea, bloating, and dysphagia. No change in history or PE since 04/07/21 office visit.    Past Medical History:  Diagnosis Date   Allergy    Arthritis    Asthma    Blood dyscrasia    LEUKOPENIA- DR. CHISM - NO TREATMENT BUT IS MONITORED   Cataract    Chronic back pain    Chronic back pain    Fibromyalgia    Foot drop, right    RELATED TO LUMBAR PROBLEMS   GERD (gastroesophageal reflux disease)    PRILOSEC PRN   History of kidney stones    Horseshoe kidney    BILATERAL   Leukopenia    Migraine    OCCULAR MIGRAINES - AURA WITH EYES   Osteoporosis    Pain    LUMBAR PAIN - RECENT FALL BECAUSE LEGS GAVE OUT - PT HAS HAD LUMBAR INJECTION SINCE THE FALL THAT HAS HELPED BACK PAIN   Pain    CHRONIC RIGHT UPPER QUADRANT PAIN - RELATED TO GALLBLADDER PROBLEM   Sciatica of right side    Sinusitis    f/u with Dr. Janace Hoard.    Vertigo     Past Surgical History:  Procedure Laterality Date   ABDOMINAL HYSTERECTOMY  1983   APPENDECTOMY  1968   BILATERAL SALPINGOOPHORECTOMY  2000   BREAST EXCISIONAL BIOPSY Right 1979   CHOLECYSTECTOMY N/A 04/12/2014   Procedure: LAPAROSCOPIC CHOLECYSTECTOMY WITH INTRAOPERATIVE CHOLANGIOGRAM;  Surgeon: Kaylyn Lim, MD;  Location: [facility];  Service: General;  Laterality: N/A;   Lumbar/cervical surgeries     CERVICAL FUSION C7-C6  WITH BONE GRAFT; C6-C5 WITH PLATE AND 4 SCREWS;  3 LUMBAR SURGERIES BUT NO FUSION   NASAL SINUS SURGERY  2010   right knee     ARTHROSCOPY   TONSILLECTOMY     TYMPANOSTOMY TUBE PLACEMENT  07/2017   WISDOM TOOTH PULLED      Current Outpatient Medications  Medication Sig Dispense Refill   Fluticasone Furoate (ARNUITY ELLIPTA) 100 MCG/ACT AEPB Inhale 1 puff into the lungs daily. 90 each 4   loratadine (CLARITIN) 10 MG tablet Take 10 mg by mouth daily.     omeprazole (PRILOSEC) 20 MG capsule Take 1 capsule (20 mg total) by mouth every Dixon. 90 capsule 3   triamcinolone (NASACORT) 55 MCG/ACT AERO nasal inhaler Place 2 sprays into the nose daily.     vitamin B-12 (CYANOCOBALAMIN) 1000 MCG tablet Take 1,000 mcg by mouth daily.     Vitamin D, Cholecalciferol, 1000 units CAPS Take 1 capsule by mouth daily.      vitamin E 400 UNIT capsule Take 800 Units by mouth daily.      albuterol (VENTOLIN HFA) 108 (90 Base) MCG/ACT inhaler Inhale 2 puffs into the lungs every 6 (six) hours as needed for wheezing or shortness of breath. 54 g 1   SUMAtriptan (IMITREX) 100 MG tablet TAKE 1 2 (ONE HALF)  TABLET BY MOUTH AS NEEDED FOR MIGRAINE  3   TURMERIC PO Take 2,000 Units by mouth daily at 8 pm.  (Patient not taking: Reported on 04/20/2021)     Current Facility-Administered Medications  Medication Dose Route Frequency Provider Last Rate Last Admin   0.9 %  sodium chloride infusion  500 mL Intravenous Once Thornton Park, MD        Allergies as of 04/20/2021 - Review Complete 04/20/2021  Allergen Reaction Noted   Peanut oil Anaphylaxis 05/06/2011   Singulair [montelukast]  09/06/2018   Clarithromycin  03/07/2007   Codeine  03/07/2007   Lactose  05/06/2011   Other  04/05/2014   Penicillins  03/07/2007   Pregabalin  06/09/2007   Sulfa antibiotics Other (See Comments) 10/02/2014   Sulfonamide derivatives  03/07/2007   Cefdinir Rash 09/30/2016    Family History  Problem Relation Age of Onset    Neurodegenerative disease Mother    Arthritis Mother    Hypertension Father    Hyperlipidemia Father    Alcohol abuse Father    Lung cancer Father    Breast cancer Sister 20   Asthma Sister    Migraines Sister    Colon polyps Brother    Asthma Brother    Prostate cancer Brother    Breast cancer Maternal Aunt 66   Leukemia Maternal Grandfather    Thyroid disease Daughter    Asthma Son    Colon cancer Neg Hx    Esophageal cancer Neg Hx    Rectal cancer Neg Hx    Stomach cancer Neg Hx      Physical Exam: General:   Alert,  well-nourished, pleasant and cooperative in NAD Head:  Normocephalic and atraumatic. Eyes:  Sclera clear, no icterus.   Conjunctiva pink. Mouth:  No deformity or lesions.   Neck:  Supple; no masses or thyromegaly. Lungs:  Clear throughout to auscultation.   No wheezes. Heart:  Regular rate and rhythm; no murmurs. Abdomen:  Soft, non-tender, nondistended, normal bowel sounds, no rebound or guarding.  Msk:  Symmetrical. No boney deformities LAD: No inguinal or umbilical LAD Extremities:  No clubbing or edema. Neurologic:  Alert and  oriented x4;  grossly nonfocal Skin:  No obvious rash or bruise. Psych:  Alert and cooperative. Normal mood and affect.     Studies/Results: No results found.    [name], MD, MPH 04/20/2021, 10:05 AM

## 2021-04-20 NOTE — Op Note (Signed)
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                            Watt Climes 2019                           S/p empiric dilation with 42mm Savory                           Atrophic gastritis with intestinal metaplasia on                            EGD 2019                           Prior history of NSAIDs Medicines:                Monitored Anesthesia Care Procedure:                Pre-Anesthesia Assessment:                           - Prior to the procedure, a History and Physical                            was performed, and patient medications and                            allergies were reviewed. The patient's tolerance of                            previous anesthesia was also reviewed. The risks                            and benefits of the procedure and the sedation                            options and risks were discussed with the patient.                            All questions were answered, and informed consent  was obtained. Prior Anticoagulants: The patient has                            taken no previous anticoagulant or antiplatelet                            agents. ASA Grade Assessment: II - A patient with                            mild systemic disease. After reviewing the risks                            and benefits, the patient was deemed in                            satisfactory condition to undergo the procedure.                           After  obtaining informed consent, the endoscope was                            passed under direct vision. Throughout the                            procedure, the patient's blood pressure, pulse, and                            oxygen saturations were monitored continuously. The                            GIF [id number] #[id number] was introduced through the                            mouth, and advanced to the third part of duodenum.                            The upper GI endoscopy was accomplished without                            difficulty. The patient tolerated the procedure                            well. Scope In: Scope Out: Findings:                 No endoscopic abnormality was evident in the                            esophagus to explain the patient's complaint of                            dysphagia. It was decided, however, to proceed with  dilation of the lower third of the esophagus. A TTS                            dilator was passed through the scope. Dilation with                            a 16-17-18 mm balloon dilator was performed to 18                            mm. The fully inflated balloon was pulled                            thorughout the distal esophagus with no resistance.                            The dilation site was examined and showed no                            change. Biopsies were then obtained from the                            mid/proximal and distal esophagus with cold forceps                            for histology of suspected eosinophilic                            esophagitis. Estimated blood loss was minimal.                           Diffuse mild mucosal changes characterized by a                            decreased vascular pattern and diffuse erythema                            were found in the gastric body. Biopsies were taken                            with a cold forceps for histology for gastric                             mapping. Estimated blood loss was minimal.                           The examined duodenum was normal. Biopsies were                            taken with a cold forceps for histology. Estimated                            blood loss was minimal.  The exam was otherwise without abnormality. Complications:            No immediate complications. Estimated blood loss:                            Minimal. Estimated Blood Loss:     Estimated blood loss was minimal. Impression:               - No endoscopic esophageal abnormality to explain                            patient's dysphagia. Esophagus dilated. Biopsied.                           - Mild gastritis. Biopsied.                           - Normal examined duodenum. Biopsied.                           - The examination was otherwise normal. Recommendation:           - Patient has a contact number available for                            emergencies. The signs and symptoms of potential                            delayed complications were discussed with the                            patient. Return to normal activities tomorrow.                            Written discharge instructions were provided to the                            patient.                           - Resume previous diet.                           - Continue present medications. Increase omeprazole                            to 40 mg BID for at least 8 weeks. Then, reduce to                            40 mg QD.                           - Avoid all NSAIDs.                           - Await pathology results.                           -  Follow-up in the office. Thornton Park MD, MD 04/20/2021 10:39:07 AM This report has been signed electronically.

## 2021-04-20 NOTE — Progress Notes (Signed)
CHECK-IN-LINDA  V/S-DT 

## 2021-04-20 NOTE — Progress Notes (Signed)
Pt in recovery with monitors in place, VSS. Report given to receiving RN. Bite guard was placed with pt awake to ensure comfort. No dental or soft tissue damage noted. RN will remove the guard when the pt is awake.  

## 2021-04-20 NOTE — Patient Instructions (Signed)
Handouts on gastritis and post-dilation diet given to patient.  Await pathology results.  Avoid NSAIDS (aspirin, ibuprofen, naproxen, or other non-steriodal anti-inflammatory drugs)  Continue present medications but increase omeprazole to 40 mg BID for at least 8 weeks then reduce back to 40 mg once daily. Follow-up in the office.     YOU HAD AN ENDOSCOPIC PROCEDURE TODAY AT [facility]:   Refer to the procedure report that was given to you for any specific questions about what was found during the examination.  If the procedure report does not answer your questions, please call your gastroenterologist to clarify.  If you requested that your care partner not be given the details of your procedure findings, then the procedure report has been included in a sealed envelope for you to review at your convenience later.  YOU SHOULD EXPECT: Some feelings of bloating in the abdomen. Passage of more gas than usual.  Walking can help get rid of the air that was put into your GI tract during the procedure and reduce the bloating. If you had a lower endoscopy (such as a colonoscopy or flexible sigmoidoscopy) you may notice spotting of blood in your stool or on the toilet paper. If you underwent a bowel prep for your procedure, you may not have a normal bowel movement for a few days.  Please Note:  You might notice some irritation and congestion in your nose or some drainage.  This is from the oxygen used during your procedure.  There is no need for concern and it should clear up in a day or so.  SYMPTOMS TO REPORT IMMEDIATELY:   Following upper endoscopy (EGD)  Vomiting of blood or coffee ground material  New chest pain or pain under the shoulder blades  Painful or persistently difficult swallowing  New shortness of breath  Fever of 100F or higher  Black, tarry-looking stools  For urgent or emergent issues, a gastroenterologist can be reached at any hour by calling [phone]. Do  not use MyChart messaging for urgent concerns.    DIET:  We do recommend a small meal at first, but then you may proceed to your regular diet.  Drink plenty of fluids but you should avoid alcoholic beverages for 24 hours.  ACTIVITY:  You should plan to take it easy for the rest of today and you should NOT DRIVE or use heavy machinery until tomorrow (because of the sedation medicines used during the test).    FOLLOW UP: Our staff will call the number listed on your records 48-72 hours following your procedure to check on you and address any questions or concerns that you may have regarding the information given to you following your procedure. If we do not reach you, we will leave a message.  We will attempt to reach you two times.  During this call, we will ask if you have developed any symptoms of COVID 19. If you develop any symptoms (ie: fever, flu-like symptoms, shortness of breath, cough etc.) before then, please call [phone].  If you test positive for Covid 19 in the 2 weeks post procedure, please call and report this information to [place].    If any biopsies were taken you will be contacted by phone or by letter within the next 1-3 weeks.  Please call us at [phone] if you have not heard about the biopsies in 3 weeks.    SIGNATURES/CONFIDENTIALITY: You and/or your care partner have signed paperwork which will be entered into your  electronic medical record.  These signatures attest to the fact that that the information above on your After Visit Summary has been reviewed and is understood.  Full responsibility of the confidentiality of this discharge information lies with you and/or your care-partner.

## 2021-04-21 ENCOUNTER — Other Ambulatory Visit: Payer: Self-pay

## 2021-04-21 DIAGNOSIS — K2950 Unspecified chronic gastritis without bleeding: Secondary | ICD-10-CM

## 2021-04-21 MED ORDER — OMEPRAZOLE 40 MG PO CPDR: 40.0000 mg | DELAYED_RELEASE_CAPSULE | Freq: Two times a day (BID) | ORAL | 3 refills | Status: DC

## 2021-04-22 ENCOUNTER — Telehealth: Payer: Self-pay

## 2021-04-22 NOTE — Telephone Encounter (Signed)
  Follow up Call-  Call back number 04/20/2021  Post procedure Call Back phone  # [phone]  Permission to leave phone message Yes  Some recent data might be hidden     Patient questions:  Do you have a fever, pain , or abdominal swelling? No. Pain Score  0 *  Have you tolerated food without any problems? Yes.    Have you been able to return to your normal activities? Yes.    Do you have any questions about your discharge instructions: Diet   No. Medications  No. Follow up visit  No.  Do you have questions or concerns about your Care? No.  Actions: * If pain score is 4 or above: No action needed, pain <4.  Have you developed a fever since your procedure? no  2.   Have you had an respiratory symptoms (SOB or cough) since your procedure? no  3.   Have you tested positive for COVID 19 since your procedure no  4.   Have you had any family members/close contacts diagnosed with the COVID 19 since your procedure?  no   If yes to any of these questions please route to Joylene John, RN and Joella Prince, RN

## 2021-05-05 DIAGNOSIS — Z23 Encounter for immunization: Secondary | ICD-10-CM | POA: Diagnosis not present

## 2021-05-15 ENCOUNTER — Encounter: Payer: Self-pay | Admitting: Gastroenterology

## 2021-05-15 ENCOUNTER — Ambulatory Visit (INDEPENDENT_AMBULATORY_CARE_PROVIDER_SITE_OTHER): Payer: Medicare Other | Admitting: Gastroenterology

## 2021-05-15 VITALS — BP 104/54 | HR 84 | Ht 66.0 in | Wt 142.1 lb

## 2021-05-15 DIAGNOSIS — K2950 Unspecified chronic gastritis without bleeding: Secondary | ICD-10-CM | POA: Diagnosis not present

## 2021-05-15 DIAGNOSIS — K31A0 Gastric intestinal metaplasia, unspecified: Secondary | ICD-10-CM | POA: Diagnosis not present

## 2021-05-15 DIAGNOSIS — K2100 Gastro-esophageal reflux disease with esophagitis, without bleeding: Secondary | ICD-10-CM

## 2021-05-15 NOTE — Progress Notes (Signed)
Referring Provider: Janie Morning, DO Primary Care Physician:  Janie Morning, DO  Chief complaint:  Abdominal pain   IMPRESSION:  Reflux and gastritis on recent EGD presenting with epigastric pain, nausea, bloating GERD-related dysphagia to solids Abnormal esophageal motility noted on EGD with Dr. 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 Watt Climes     - gastric biopsies on EGD 04/20/21 showed no atrophic gastritis, IM limited to antrum    - no family history of gastric cancer Prior history of NSAIDs Family history of colon polyps (brother) Cologuard negative 08/2020 Prior colonoscopy with Dr. Oletta Lamas in 2012   GI symptoms are largely improving on PPI. Given her side effects associated with omeprazole, we dicussed switching to Prevacid. She wanted to use the omeprazole that she had at home prior to changing the prescription. Admitting that her symptoms may improve with an alternative PPI.   Intestinal metaplasia seen on antral biopsies but not elsewhere on recent EGD. Although gastropathy was present, no atrophic gastritis. No identified risks for gastric cancer. Surveillance not indicated at this time.   She would ultimately like to avoid all PPI due to concerns about metabolic bone disease. I encouraged her to stick with therapy long enough to try to treat the endoscopic findings. Discussed managing symptoms and minimize exposure to PPI as able. I encourage her to use calcium and vitamin D supplements while using PPI.     PLAN: - Omeprazole 40 mg BID until Thanksgiving, reduce to once daily at that time - Switch omeprazole to Prevacid 30 mg BID to express script if she wishes - Follow-up in 2-3 months, earlier as needed   HPI: Leslie Dixon is a 76 y.o. female who returns in follow-up after her initial consultation 04/07/21 and her endoscopic evaluation 04/20/21. The  interval history is obtained through the patient, review of her electronic health record, and prior records from [facility]. She has a history of mild intermittent asthma on arnuity, seasonal allergies, LPR, ocular migraines, benign thyroid nodule, osteoporosis. She reports a history of IBS and cholecystectomy.    Evaluated in September 2022 for severe, sharp epigastric abdominal pain with associated nausea and bloating that started in June 2022 not responding to omeprazole BID and famotidine 20 mg QHS. No change in bowel habits. History of meloxicam use, but not for over a year.  She was concerned about a bleeding ulcer because of the severity of her symptoms.   Chronic GI symptoms including a longstanding history of dysphagia to bread. Some choking. Previously on tumeric. No dysphagia to liquids. Symptoms improved after dilation with Dr. Watt Climes in 2019.   EGD 04/20/21 mild gastritis. Empiric dilation was performed with TTS 16-4mm balloon. showed reactive gastropathy with focal intestinal metaplasia in the antrum, reactive gastropathy, and reflux. Duodenal biopsies were normal.   Symptoms have improved on omeprazole 40 mg BID. She feels the empiric dilation helped. However, she has had headaches, particularly involving her right eye, increased cough, arthralgias, and dizziness that she attributes to omeprazole. She is also very concerned about the long term risks of PPI therapy. Her daughter is a NP and has encouraged her to continue with PPI therapy, at least in the short term.   Prior endoscopic evaluation: - Colonoscopy with Dr. Oletta Lamas 2012: internal hemorrhoids - Upper endoscopy with Dr. Oletta Lamas in 2015 - no biopsies obtained at that time - Upper  endoscopy with Dr. Lizbeth Bark 04/26/2018 to evaluate epigastric abdominal pain and dysphagia showed abnormal motility at the gastroesophageal junction, decrease in motility of the esophageal body, spastic lower esophageal sphincter, empiric dilation with 16  mm savory, chronic inactive atrophic gastritis with intestinal metaplasia, and normal duodenum.  There was no H. pylori.  No dysplasia. - EGD 04/20/21: reactive gastropathy with focal intestinal metaplasia in the antrum, reactive gastropathy, and reflux. Duodenal biopsies were normal.   Recent pertinent labs: - Cologuard negative 09/10/2020 - Labs 12/07/19 showed a normal BMP. Unable to located additional labs.    Past Medical History:  Diagnosis Date   Allergy    Arthritis    Asthma    Blood dyscrasia    LEUKOPENIA- DR. CHISM - NO TREATMENT BUT IS MONITORED   Cataract    Chronic back pain    Chronic back pain    Fibromyalgia    Foot drop, right    RELATED TO LUMBAR PROBLEMS   GERD (gastroesophageal reflux disease)    PRILOSEC PRN   History of kidney stones    Horseshoe kidney    BILATERAL   Leukopenia    Migraine    OCCULAR MIGRAINES - AURA WITH EYES   Osteoporosis    Pain    LUMBAR PAIN - RECENT FALL BECAUSE LEGS GAVE OUT - PT HAS HAD LUMBAR INJECTION SINCE THE FALL THAT HAS HELPED BACK PAIN   Pain    CHRONIC RIGHT UPPER QUADRANT PAIN - RELATED TO GALLBLADDER PROBLEM   Sciatica of right side    Sinusitis    f/u with Dr. Janace Hoard.    Vertigo     Past Surgical History:  Procedure Laterality Date   ABDOMINAL HYSTERECTOMY  1983   APPENDECTOMY  1968   BILATERAL SALPINGOOPHORECTOMY  2000   BREAST EXCISIONAL BIOPSY Right 1979   CHOLECYSTECTOMY N/A 04/12/2014   Procedure: LAPAROSCOPIC CHOLECYSTECTOMY WITH INTRAOPERATIVE CHOLANGIOGRAM;  Surgeon: Kaylyn Lim, MD;  Location: [facility];  Service: General;  Laterality: N/A;   Lumbar/cervical surgeries     CERVICAL FUSION C7-C6 WITH BONE GRAFT; C6-C5 WITH PLATE AND 4 SCREWS;  3 LUMBAR SURGERIES BUT NO FUSION   NASAL SINUS SURGERY  2010   right knee     ARTHROSCOPY   TONSILLECTOMY     TYMPANOSTOMY TUBE PLACEMENT  07/2017   WISDOM TOOTH PULLED        Current Outpatient Medications  Medication Sig Dispense Refill   albuterol  (VENTOLIN HFA) 108 (90 Base) MCG/ACT inhaler Inhale 2 puffs into the lungs every 6 (six) hours as needed for wheezing or shortness of breath. 54 g 1   Cholecalciferol (VITAMIN D-3) 125 MCG (5000 UT) TABS Take 1 tablet by mouth daily.     Fluticasone Furoate (ARNUITY ELLIPTA) 100 MCG/ACT AEPB Inhale 1 puff into the lungs daily. 90 each 4   loratadine (CLARITIN) 10 MG tablet Take 10 mg by mouth daily.     omeprazole (PRILOSEC) 40 MG capsule Take 1 capsule (40 mg total) by mouth 2 (two) times daily. 180 capsule 3   SUMAtriptan (IMITREX) 100 MG tablet TAKE 1 2 (ONE HALF) TABLET BY MOUTH AS NEEDED FOR MIGRAINE  3   triamcinolone (NASACORT) 55 MCG/ACT AERO nasal inhaler Place 2 sprays into the nose daily.     TURMERIC PO Take 2,000 Units by mouth daily at 8 pm.     vitamin B-12 (CYANOCOBALAMIN) 1000 MCG tablet Take 1,000 mcg by mouth every other day.     vitamin E 400  UNIT capsule Take 800 Units by mouth daily.      No current facility-administered medications for this visit.    Allergies as of 05/15/2021 - Review Complete 05/15/2021  Allergen Reaction Noted   Peanut oil Anaphylaxis 05/06/2011   Singulair [montelukast]  09/06/2018   Aspartame and phenylalanine  05/15/2021   Clarithromycin  03/07/2007   Codeine  03/07/2007   Lactose  05/06/2011   Meloxicam  05/15/2021   Other  04/05/2014   Penicillins  03/07/2007   Pregabalin  06/09/2007   Sulfa antibiotics Other (See Comments) 10/02/2014   Sulfonamide derivatives  03/07/2007   Cefdinir Rash 09/30/2016   Glycerin Other (See Comments) 05/15/2021    Family History  Problem Relation Age of Onset   Neurodegenerative disease Mother    Arthritis Mother    Hypertension Father    Hyperlipidemia Father    Alcohol abuse Father    Lung cancer Father    Breast cancer Sister 35   Asthma Sister    Migraines Sister    Colon polyps Brother    Asthma Brother    Prostate cancer Brother    Breast cancer Maternal Aunt 66   Leukemia Maternal  Grandfather    Thyroid disease Daughter    Asthma Son    Colon cancer Neg Hx    Esophageal cancer Neg Hx    Rectal cancer Neg Hx    Stomach cancer Neg Hx      Physical Exam: General:   Alert,  well-nourished, pleasant and cooperative in NAD Head:  Normocephalic and atraumatic. Eyes:  Sclera clear, no icterus.   Conjunctiva pink. Abdomen:  Soft, nontender, nondistended, normal bowel sounds, no rebound or guarding. No hepatosplenomegaly.   Neurologic:  Alert and  oriented x4;  grossly nonfocal Skin:  Intact without significant lesions or rashes. Psych:  Alert and cooperative. Normal mood and affect.    Alois Colgan L. Tarri Glenn, MD, MPH 05/17/2021, 2:27 PM

## 2021-05-15 NOTE — Patient Instructions (Addendum)
If you are age 77 or older, your body mass index should be between 23-30. Your Body mass index is 22.94 kg/m. If this is out of the aforementioned range listed, please consider follow up with your Primary Care Provider. ________________________________________________________  The [facility] providers would like to encourage you to use [facility] to communicate with providers for non-urgent requests or questions.  Due to long hold times on the telephone, sending your provider a message by [facility] may be a faster and more efficient way to get a response.  Please allow 48 business hours for a response.  Please remember that this is for non-urgent requests.   Continue Omeprazole until after Thanksgiving then decrease to once daily.  You have been scheduled to follow up with Dr. Tarri Glenn on July 24, 2021 at 10:10 am. If you find that this will not work, please call the office at [phone].

## 2021-05-17 ENCOUNTER — Encounter: Payer: Self-pay | Admitting: Gastroenterology

## 2021-06-03 DIAGNOSIS — Z23 Encounter for immunization: Secondary | ICD-10-CM | POA: Diagnosis not present

## 2021-06-09 DIAGNOSIS — G43B0 Ophthalmoplegic migraine, not intractable: Secondary | ICD-10-CM | POA: Diagnosis not present

## 2021-06-09 DIAGNOSIS — H2511 Age-related nuclear cataract, right eye: Secondary | ICD-10-CM | POA: Diagnosis not present

## 2021-06-09 DIAGNOSIS — H35363 Drusen (degenerative) of macula, bilateral: Secondary | ICD-10-CM | POA: Diagnosis not present

## 2021-06-09 DIAGNOSIS — M316 Other giant cell arteritis: Secondary | ICD-10-CM | POA: Diagnosis not present

## 2021-07-01 DIAGNOSIS — H6983 Other specified disorders of Eustachian tube, bilateral: Secondary | ICD-10-CM | POA: Diagnosis not present

## 2021-07-08 ENCOUNTER — Ambulatory Visit (INDEPENDENT_AMBULATORY_CARE_PROVIDER_SITE_OTHER): Payer: Medicare Other | Admitting: Pulmonary Disease

## 2021-07-08 ENCOUNTER — Other Ambulatory Visit: Payer: Self-pay

## 2021-07-08 ENCOUNTER — Encounter: Payer: Self-pay | Admitting: Pulmonary Disease

## 2021-07-08 VITALS — BP 118/78 | HR 78 | Temp 97.3°F | Ht 66.0 in | Wt 140.0 lb

## 2021-07-08 DIAGNOSIS — J3089 Other allergic rhinitis: Secondary | ICD-10-CM

## 2021-07-08 DIAGNOSIS — J302 Other seasonal allergic rhinitis: Secondary | ICD-10-CM | POA: Diagnosis not present

## 2021-07-08 DIAGNOSIS — J4520 Mild intermittent asthma, uncomplicated: Secondary | ICD-10-CM

## 2021-07-08 DIAGNOSIS — K219 Gastro-esophageal reflux disease without esophagitis: Secondary | ICD-10-CM

## 2021-07-08 NOTE — Patient Instructions (Signed)
You are doing well.  Continue Arnuity.  See you in follow-up in 6 months time.  Call sooner should any new problems arise.

## 2021-07-08 NOTE — Progress Notes (Signed)
Subjective:    Patient ID: Leslie Dixon, female    DOB: 1946/02/25, 75 y.o.   MRN: [id number] Chief Complaint  Patient presents with   Follow-up    asthma   HPI Patient is a 75 year old lifelong never smoker with mild intermittent asthma.  She was last seen here on 30 December 2020.  This is a scheduled visit.  She has not had any major flareups in the interim with the exception of a mild flare in September while she was standing to her butterfly garden and she did some weeding without wearing a mask which she always does to prevent pollen inhalation.  This was a minor flare and lasted 1 to 2 days.  No sequela.  She has not had any fevers, chills or sweats.  No cough or sputum production.  She has issues with laryngopharyngeal reflux and vocal cord dysfunction however these have been controlled with PPI and nasal hygiene for control of her chronic rhinosinusitis.  She is followed by ENT as well.  On the average she only needs to use albuterol perhaps once or twice a month.  He is on [employer] which is controlling her well.  Does not endorse any other symptomatology.  Overall she feels well and looks well.   Review of Systems A 10 point review of systems was performed and it is as noted above otherwise negative.  Patient Active Problem List   Diagnosis Date Noted   Mild intermittent asthma without complication 02/54/2706   Rib contusion, right, initial encounter 05/29/2019   Elevated HDL 05/31/2018   Panic attack as reaction to stress 05/31/2018   Lumbar post-laminectomy syndrome 01/24/2018   Gastroesophageal reflux disease 12/28/2017   Chronic neck pain 12/28/2017   Osteoarthritis 12/28/2017   Cervical radiculopathy 10/24/2017   Chronic pain 10/19/2017   DDD (degenerative disc disease), cervical 10/19/2017   Irritable bowel syndrome with diarrhea 09/29/2017   Migraine without status migrainosus, not intractable 09/29/2017   Family history of breast cancer- sister at age 38- new  onset 02/23/2017   History of thyroid nodule- R side ( Dr Loanne Drilling eval in 10/17) 02/23/2017   Status post lumbar surgery- foot drop R foot 02/23/2017   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   Right thyroid nodule 05/28/2016   Intolerance to cold 05/02/2016   History of non anemic vitamin B12 deficiency 05/02/2016   Environmental and seasonal allergies 04/20/2016   h/o GAD (had panic in past) 02/12/2016   GERD (gastroesophageal reflux disease)    Osteoporosis    Chronic back pain    Idiopathic benign Leukopenia    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   HYPOKALEMIA 01/03/2008   Allergic rhinitis due to pollen 12/13/2007   VOCAL CORD DISORDER 06/09/2007   LACTOSE INTOLERANCE 03/07/2007   Ocular migraine 03/07/2007   CARPAL TUNNEL SYNDROME 03/07/2007   VARICOSE VEIN 03/07/2007   NEPHROLITHIASIS, HX OF 03/07/2007   Social History   Tobacco Use   Smoking status: Never   Smokeless tobacco: Never  Substance Use Topics   Alcohol use: No   Allergies  Allergen Reactions   Peanut Oil Anaphylaxis   Singulair [Montelukast]     Facial swelling   Aspartame And Phenylalanine    Clarithromycin     REACTION: GI   Codeine Other (See Comments)    REACTION: nausea and vomiting   Lactose     Other reaction(s): GI Upset (intolerance)   Meloxicam Other (See Comments)    Face swelling   Other  GLUTEN RESTRICTED LACTOSE INTOLERANT ALLERGIC TO NUTS AND CORN   Penicillins     REACTION: per allergy testing/Patient states she has taken Augmentin without complications.    Pregabalin     REACTION: headache, several side effects   Sulfa Antibiotics Other (See Comments)    REACTION: Hives, wheezing   Sulfonamide Derivatives     REACTION: Hives, wheezing   Cefdinir Rash   Glycerin Other (See Comments)    Migraines, eye problems   Current Meds  Medication Sig   albuterol (VENTOLIN HFA) 108 (90 Base) MCG/ACT inhaler Inhale 2 puffs into the lungs every 6 (six) hours as needed for wheezing or  shortness of breath.   Cholecalciferol (VITAMIN D-3) 125 MCG (5000 UT) TABS Take 1 tablet by mouth daily.   Fluticasone Furoate (ARNUITY ELLIPTA) 100 MCG/ACT AEPB Inhale 1 puff into the lungs daily.   loratadine (CLARITIN) 10 MG tablet Take 10 mg by mouth daily.   omeprazole (PRILOSEC) 40 MG capsule Take 1 capsule (40 mg total) by mouth 2 (two) times daily.   SUMAtriptan (IMITREX) 100 MG tablet TAKE 1 2 (ONE HALF) TABLET BY MOUTH AS NEEDED FOR MIGRAINE   triamcinolone (NASACORT) 55 MCG/ACT AERO nasal inhaler Place 2 sprays into the nose daily.   TURMERIC PO Take 2,000 Units by mouth daily at 8 pm.   vitamin B-12 (CYANOCOBALAMIN) 1000 MCG tablet Take 1,000 mcg by mouth every other day.   vitamin E 400 UNIT capsule Take 800 Units by mouth daily.    Immunization History  Administered Date(s) Administered   DT (Pediatric) 08/17/2010   DTaP 08/17/2010   Fluad Quad(high Dose 65+) 04/25/2019   Influenza Whole 04/30/2004, 04/26/2007   Influenza, High Dose Seasonal PF 05/13/2016, 05/10/2017, 04/25/2018, 04/15/2020   Influenza, Quadrivalent, Recombinant, Inj, Pf 05/05/2021   Influenza,inj,quad, With Preservative 04/25/2017   Influenza-Unspecified 06/07/2011, 04/24/2012   PFIZER(Purple Top)SARS-COV-2 Vaccination 08/13/2019, 09/06/2019, 05/16/2020   Pneumococcal Conjugate-13 04/20/2016   Pneumococcal Polysaccharide-23 04/30/2004, 07/29/2008, 07/27/2010, 04/20/2016   Td 07/26/2002, 11/19/2003   Tdap 08/17/2010, 04/20/2016   Zoster Recombinat (Shingrix) 04/25/2018, 09/11/2018   Zoster, Live 07/27/2007       Objective:   Physical Exam BP 118/78 (BP Location: Left Arm, Patient Position: Sitting, Cuff Size: Normal)    Pulse 78    Temp (!) 97.3 F (36.3 C) (Oral)    Ht 5\' 6"  (1.676 m)    Wt 140 lb (63.5 kg)    SpO2 98%    BMI 22.60 kg/m   GENERAL: Well-developed well-nourished woman in no acute distress.  Fully ambulatory.   HEAD: Normocephalic, atraumatic. EYES: Pupils equal, round, reactive to  light.  No scleral icterus. MOUTH: Nose/mouth/throat not examined due to masking requirements for COVID 19. NECK: Supple. No thyromegaly. Trachea midline. No JVD.  No adenopathy. PULMONARY: Good air entry bilaterally.  No adventitious sounds. CARDIOVASCULAR: S1 and S2. Regular rate and rhythm.  No rubs, murmurs or gallops heard. ABDOMEN: Benign. MUSCULOSKELETAL: No joint deformity, no clubbing, no edema. NEUROLOGIC: No focal deficits, no gait disturbance, speech is fluent. SKIN: Intact,warm,dry.  Limited exam: No rashes. PSYCH: Mood and behavior normal      Assessment & Plan:     ICD-10-CM   1. Mild intermittent asthma without complication  C37.62    Well-controlled on Arnuity and as needed albuterol No recent major flareups    2. Laryngopharyngeal reflux (LPR)  K21.9    Follows antireflux measures On PPI     3. Perennial allergic rhinitis with seasonal variation  J30.89    J30.2    Control with nasal hygiene Loratadine as needed Nasacort Follows with ENT     Patient is doing well.  We will see her in follow-up in 6 months time she is to contact us prior to that time should any new difficulties arise.  Renold Don, MD Advanced Bronchoscopy PCCM [facility]    *This note was dictated using voice recognition software/Dragon.  Despite best efforts to proofread, errors can occur which can change the meaning. Any transcriptional errors that result from this process are unintentional and may not be fully corrected at the time of dictation.

## 2021-07-09 ENCOUNTER — Other Ambulatory Visit: Payer: Self-pay | Admitting: Family Medicine

## 2021-07-09 DIAGNOSIS — R519 Headache, unspecified: Secondary | ICD-10-CM | POA: Diagnosis not present

## 2021-07-09 DIAGNOSIS — R7309 Other abnormal glucose: Secondary | ICD-10-CM | POA: Diagnosis not present

## 2021-07-09 DIAGNOSIS — R42 Dizziness and giddiness: Secondary | ICD-10-CM | POA: Diagnosis not present

## 2021-07-09 DIAGNOSIS — W19XXXA Unspecified fall, initial encounter: Secondary | ICD-10-CM | POA: Diagnosis not present

## 2021-07-09 DIAGNOSIS — R946 Abnormal results of thyroid function studies: Secondary | ICD-10-CM | POA: Diagnosis not present

## 2021-07-09 DIAGNOSIS — Z79899 Other long term (current) drug therapy: Secondary | ICD-10-CM | POA: Diagnosis not present

## 2021-07-09 DIAGNOSIS — Z13220 Encounter for screening for lipoid disorders: Secondary | ICD-10-CM | POA: Diagnosis not present

## 2021-07-09 DIAGNOSIS — E559 Vitamin D deficiency, unspecified: Secondary | ICD-10-CM | POA: Diagnosis not present

## 2021-07-09 DIAGNOSIS — E538 Deficiency of other specified B group vitamins: Secondary | ICD-10-CM | POA: Diagnosis not present

## 2021-07-11 ENCOUNTER — Other Ambulatory Visit: Payer: Medicare Other

## 2021-07-15 ENCOUNTER — Ambulatory Visit: Payer: Medicare Other | Admitting: Pulmonary Disease

## 2021-07-15 ENCOUNTER — Ambulatory Visit
Admission: RE | Admit: 2021-07-15 | Discharge: 2021-07-15 | Disposition: A | Discharge status: Home / Self Care | Payer: Medicare Other | Source: Ambulatory Visit | Attending: Family Medicine | Admitting: Family Medicine

## 2021-07-15 ENCOUNTER — Other Ambulatory Visit: Payer: Self-pay

## 2021-07-15 DIAGNOSIS — G319 Degenerative disease of nervous system, unspecified: Secondary | ICD-10-CM | POA: Diagnosis not present

## 2021-07-15 DIAGNOSIS — R42 Dizziness and giddiness: Secondary | ICD-10-CM

## 2021-07-15 DIAGNOSIS — R519 Headache, unspecified: Secondary | ICD-10-CM

## 2021-07-15 MED ORDER — GADOBENATE DIMEGLUMINE 529 MG/ML IV SOLN
12.0000 mL | Freq: Once | INTRAVENOUS | Status: AC | PRN
  Administered 2021-07-15: 15:00:00 12 mL via INTRAVENOUS

## 2021-07-22 ENCOUNTER — Encounter: Payer: Self-pay | Admitting: *Deleted

## 2021-07-22 DIAGNOSIS — E538 Deficiency of other specified B group vitamins: Secondary | ICD-10-CM | POA: Diagnosis not present

## 2021-07-22 DIAGNOSIS — Z9109 Other allergy status, other than to drugs and biological substances: Secondary | ICD-10-CM | POA: Diagnosis not present

## 2021-07-22 DIAGNOSIS — M5136 Other intervertebral disc degeneration, lumbar region: Secondary | ICD-10-CM | POA: Diagnosis not present

## 2021-07-22 DIAGNOSIS — Z23 Encounter for immunization: Secondary | ICD-10-CM | POA: Diagnosis not present

## 2021-07-22 DIAGNOSIS — R7309 Other abnormal glucose: Secondary | ICD-10-CM | POA: Diagnosis not present

## 2021-07-22 DIAGNOSIS — J4520 Mild intermittent asthma, uncomplicated: Secondary | ICD-10-CM | POA: Diagnosis not present

## 2021-07-22 DIAGNOSIS — R946 Abnormal results of thyroid function studies: Secondary | ICD-10-CM | POA: Diagnosis not present

## 2021-07-22 DIAGNOSIS — M5030 Other cervical disc degeneration, unspecified cervical region: Secondary | ICD-10-CM | POA: Diagnosis not present

## 2021-07-22 DIAGNOSIS — E7800 Pure hypercholesterolemia, unspecified: Secondary | ICD-10-CM | POA: Diagnosis not present

## 2021-07-22 DIAGNOSIS — J329 Chronic sinusitis, unspecified: Secondary | ICD-10-CM | POA: Diagnosis not present

## 2021-07-22 DIAGNOSIS — Z0000 Encounter for general adult medical examination without abnormal findings: Secondary | ICD-10-CM | POA: Diagnosis not present

## 2021-07-22 DIAGNOSIS — K219 Gastro-esophageal reflux disease without esophagitis: Secondary | ICD-10-CM | POA: Diagnosis not present

## 2021-07-24 ENCOUNTER — Ambulatory Visit (INDEPENDENT_AMBULATORY_CARE_PROVIDER_SITE_OTHER): Payer: Medicare Other | Admitting: Gastroenterology

## 2021-07-24 ENCOUNTER — Encounter: Payer: Self-pay | Admitting: Gastroenterology

## 2021-07-24 VITALS — BP 112/60 | HR 72 | Ht 66.0 in | Wt 132.0 lb

## 2021-07-24 DIAGNOSIS — K2950 Unspecified chronic gastritis without bleeding: Secondary | ICD-10-CM

## 2021-07-24 DIAGNOSIS — K31A0 Gastric intestinal metaplasia, unspecified: Secondary | ICD-10-CM | POA: Diagnosis not present

## 2021-07-24 DIAGNOSIS — K2100 Gastro-esophageal reflux disease with esophagitis, without bleeding: Secondary | ICD-10-CM | POA: Diagnosis not present

## 2021-07-24 NOTE — Patient Instructions (Addendum)
It was a pleasure to see you today.  I am delighted that you are feel better off our omeprazole.  If you symptoms return, we will want to get you back on omeprazole if your symptoms return.  I have not arranged a follow-up appointment today. But, please let me know if you need any assistance in the future.   I would like to see you annually if you are continuing to need omeprazole.   If you are age 77 or older, your body mass index should be between 23-30. Your Body mass index is 21.31 kg/m. If this is out of the aforementioned range listed, please consider follow up with your Primary Care Provider.  If you are age 16 or younger, your body mass index should be between 19-25. Your Body mass index is 21.31 kg/m. If this is out of the aformentioned range listed, please consider follow up with your Primary Care Provider.   ________________________________________________________  The [facility] providers would like to encourage you to use [facility] to communicate with providers for non-urgent requests or questions.  Due to long hold times on the telephone, sending your provider a message by [facility] may be a faster and more efficient way to get a response.  Please allow 48 business hours for a response.  Please remember that this is for non-urgent requests.  _______________________________________________________  Due to recent changes in healthcare laws, you may see the results of your imaging and laboratory studies on MyChart before your provider has had a chance to review them.  We understand that in some cases there may be results that are confusing or concerning to you. Not all laboratory results come back in the same time frame and the provider may be waiting for multiple results in order to interpret others.  Please give [place] 48 hours in order for your provider to thoroughly review all the results before contacting the office for clarification of your results.

## 2021-07-24 NOTE — Progress Notes (Signed)
Referring Provider: Janie Morning, DO Primary Care Physician:  Janie Morning, DO  Chief complaint:  Questions about medications   IMPRESSION:  Reflux and gastritis on recent EGD presenting with epigastric pain, nausea, bloating    - symptoms resolved on PPI GERD-related dysphagia to solids    - symptoms resolved on PPI Abnormal esophageal motility noted on EGD with Dr. 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 Watt Climes     - gastric biopsies on EGD 04/20/21 showed no atrophic gastritis, IM limited to antrum    - no family history of gastric cancer, no identified risks for gastric cancer    - surveillance not indicated at this time Prior history of NSAIDs Family history of colon polyps (brother) Cologuard negative 08/2020 Prior colonoscopy with Dr. Oletta Lamas in 2012    PLAN: - Resume PPI therapy if symptoms return - Follow-up in the office PRN, at least annually if using PPI therapy   HPI: Leslie Dixon is a 75 y.o. female who returns in scheduled follow-up. She was last seen 05/15/21. The interval history is obtained through the patient, review of her electronic health record, and prior records from [facility]. She has a history of mild intermittent asthma on arnuity, seasonal allergies, LPR, ocular migraines, benign thyroid nodule, osteoporosis. She reports a history of IBS and cholecystectomy.    Evaluated in September 2022 for severe, sharp epigastric abdominal pain with associated nausea and bloating that started in June 2022 not responding to omeprazole BID and famotidine 20 mg QHS. History of meloxicam use, but not for over a year.  She was concerned about a bleeding ulcer because of the severity of her symptoms.   Chronic GI symptoms including a longstanding history of dysphagia to bread. Some choking. Previously on tumeric. No dysphagia to liquids. Symptoms improved  after dilation with Dr. Watt Climes in 2019.   EGD 04/20/21 mild gastritis. Empiric dilation was performed with TTS 16-82mm balloon. showed reactive gastropathy with focal intestinal metaplasia in the antrum, reactive gastropathy, and reflux. Duodenal biopsies were normal.   Symptoms have improved on omeprazole 40 mg BID. She feels the empiric dilation helped. However, she has had headaches, particularly involving her right eye, increased cough, arthralgias, and dizziness that she attributes to omeprazole. She is also very concerned about the long term risks of PPI therapy. Her daughter is a NP and has encouraged her to continue with PPI therapy, at least in the short term. We discussed switching to an alternative PPI, but, she decided to complete the omeprazole that she had on hand.  Reduced PPI to once daily over Thanksgiving. Discontinued all together over the last couple of weeks without any worsening in symptoms. Has identified some foods that trigger her symptoms, particularly broccoli. But, otherwise she feels like her symptoms are well controlled. She does not feel that additional treatment is indicated at this time. GI ROS is otherwise negative.   Prior endoscopic evaluation: - Colonoscopy with Dr. Oletta Lamas 2012: internal hemorrhoids - Upper endoscopy with Dr. Oletta Lamas in 2015 - no biopsies obtained at that time - Upper endoscopy with Dr. 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  There was no  H. pylori.  No dysplasia. - EGD 04/20/21: reactive gastropathy with focal intestinal metaplasia in the antrum, reactive gastropathy, and reflux. Duodenal biopsies were normal.   Recent pertinent labs: - Cologuard negative  09/10/2020 - Labs 12/07/19 showed a normal BMP. Unable to located additional labs.    Past Medical History:  Diagnosis Date   Allergy    Arthritis    Asthma    Blood dyscrasia    LEUKOPENIA- DR. CHISM - NO TREATMENT BUT IS MONITORED   Cataract    Chronic back pain    Chronic back pain    Fibromyalgia    Foot drop, right    RELATED TO LUMBAR PROBLEMS   GERD (gastroesophageal reflux disease)    PRILOSEC PRN   History of kidney stones    Horseshoe kidney    BILATERAL   Internal hemorrhoids    Leukopenia    Migraine    OCCULAR MIGRAINES - AURA WITH EYES   Osteoporosis    Pain    LUMBAR PAIN - RECENT FALL BECAUSE LEGS GAVE OUT - PT HAS HAD LUMBAR INJECTION SINCE THE FALL THAT HAS HELPED BACK PAIN   Pain    CHRONIC RIGHT UPPER QUADRANT PAIN - RELATED TO GALLBLADDER PROBLEM   Sciatica of right side    Sinusitis    f/u with Dr. Janace Hoard.    Vertigo     Past Surgical History:  Procedure Laterality Date   ABDOMINAL HYSTERECTOMY  1983   APPENDECTOMY  1968   BILATERAL SALPINGOOPHORECTOMY  2000   BREAST EXCISIONAL BIOPSY Right 1979   CHOLECYSTECTOMY N/A 04/12/2014   Procedure: LAPAROSCOPIC CHOLECYSTECTOMY WITH INTRAOPERATIVE CHOLANGIOGRAM;  Surgeon: Kaylyn Lim, MD;  Location: [facility];  Service: General;  Laterality: N/A;   Lumbar/cervical surgeries     CERVICAL FUSION C7-C6 WITH BONE GRAFT; C6-C5 WITH PLATE AND 4 SCREWS;  3 LUMBAR SURGERIES BUT NO FUSION   NASAL SINUS SURGERY  2010   right knee     ARTHROSCOPY   TONSILLECTOMY     TYMPANOSTOMY TUBE PLACEMENT  07/2017   WISDOM TOOTH PULLED        Current Outpatient Medications  Medication Sig Dispense Refill   albuterol (VENTOLIN HFA) 108 (90 Base) MCG/ACT inhaler Inhale 2 puffs into the lungs every 6 (six) hours as needed for wheezing or shortness of breath. 54 g 1   Cholecalciferol (VITAMIN D-3) 125 MCG (5000 UT) TABS Take 1 tablet by mouth daily.     Fluticasone Furoate (ARNUITY ELLIPTA) 100 MCG/ACT AEPB Inhale 1 puff into  the lungs daily. 90 each 4   loratadine (CLARITIN) 10 MG tablet Take 10 mg by mouth daily.     meclizine (ANTIVERT) 12.5 MG tablet SMARTSIG:1 Tablet(s) By Mouth Every 12 Hours PRN     omeprazole (PRILOSEC) 40 MG capsule Take 1 capsule (40 mg total) by mouth 2 (two) times daily. 180 capsule 3   SUMAtriptan (IMITREX) 100 MG tablet TAKE 1 2 (ONE HALF) TABLET BY MOUTH AS NEEDED FOR MIGRAINE  3   triamcinolone (NASACORT) 55 MCG/ACT AERO nasal inhaler Place 2 sprays into the nose daily.     TURMERIC PO Take 2,000 Units by mouth daily at 8 pm.     vitamin B-12 (CYANOCOBALAMIN) 1000 MCG tablet Take 1,000 mcg by mouth every other day.     vitamin E 400 UNIT capsule Take 800 Units by mouth daily.      No current facility-administered medications for this visit.    Allergies as of 07/24/2021 - Review  Complete 07/24/2021  Allergen Reaction Noted   Peanut oil Anaphylaxis 05/06/2011   Singulair [montelukast]  09/06/2018   Aspartame and phenylalanine  05/15/2021   Clarithromycin  03/07/2007   Codeine Other (See Comments) 03/07/2007   Lactose  05/06/2011   Meloxicam Other (See Comments) 03/09/2021   Other  04/05/2014   Penicillins  03/07/2007   Pregabalin  06/09/2007   Sulfa antibiotics Other (See Comments) 10/02/2014   Sulfonamide derivatives  03/07/2007   Cefdinir Rash 09/30/2016   Glycerin Other (See Comments) 05/15/2021    Family History  Problem Relation Age of Onset   Neurodegenerative disease Mother    Arthritis Mother    Hypertension Father    Hyperlipidemia Father    Alcohol abuse Father    Lung cancer Father    Breast cancer Sister 60   Asthma Sister    Migraines Sister    Colon polyps Brother    Asthma Brother    Prostate cancer Brother    Breast cancer Maternal Aunt 66   Leukemia Maternal Grandfather    Thyroid disease Daughter    Asthma Son    Colon cancer Neg Hx    Esophageal cancer Neg Hx    Rectal cancer Neg Hx    Stomach cancer Neg Hx      Physical  Exam: General:   Alert,  well-nourished, pleasant and cooperative in NAD Head:  Normocephalic and atraumatic. Eyes:  Sclera clear, no icterus.   Conjunctiva pink. Abdomen:  Soft, nontender, nondistended, normal bowel sounds, no rebound or guarding. No hepatosplenomegaly.   Neurologic:  Alert and  oriented x4;  grossly nonfocal Skin:  Intact without significant lesions or rashes. Psych:  Alert and cooperative. Normal mood and affect.    Ondre Salvetti L. Tarri Glenn, MD, MPH 07/24/2021, 3:15 PM

## 2021-09-03 DIAGNOSIS — Z20822 Contact with and (suspected) exposure to covid-19: Secondary | ICD-10-CM | POA: Diagnosis not present

## 2021-09-09 DIAGNOSIS — M79672 Pain in left foot: Secondary | ICD-10-CM | POA: Diagnosis not present

## 2021-10-21 DIAGNOSIS — Z20822 Contact with and (suspected) exposure to covid-19: Secondary | ICD-10-CM | POA: Diagnosis not present

## 2021-10-22 ENCOUNTER — Telehealth: Payer: Self-pay | Admitting: Pulmonary Disease

## 2021-10-22 DIAGNOSIS — J4520 Mild intermittent asthma, uncomplicated: Secondary | ICD-10-CM

## 2021-10-22 MED ORDER — METHYLPREDNISOLONE 4 MG PO TBPK: ORAL_TABLET | ORAL | 0 refills | Status: DC

## 2021-10-22 MED ORDER — ALBUTEROL SULFATE HFA 108 (90 BASE) MCG/ACT IN AERS: 2.0000 | INHALATION_SPRAY | Freq: Four times a day (QID) | RESPIRATORY_TRACT | 1 refills | Status: DC | PRN

## 2021-10-22 NOTE — Telephone Encounter (Signed)
May call in a  Medrol Dosepak taper.  Suspect this is a flare of her allergies. ?

## 2021-10-22 NOTE — Telephone Encounter (Signed)
Patient is aware of recommendations and voiced her understanding.  ?Medrol sent to preferred pharmacy. ?Nothing further needed,  ? ?

## 2021-10-22 NOTE — Telephone Encounter (Signed)
Spoke to patient.  ?C/o dry cough and wheezing x5d ?Sob is baseline.  ?Denied f/c/s or additional sx.  ?Fully vaccinated against covid and flu.  ?No recent covid or flu test.  ?Using Arnuity once daily and albuterol once daily.  ?She wanted to remind Dr. Patsey Berthold that she can not tolerate zpak.  ? ?Dr. Patsey Berthold, please advise. Thanks ? ?

## 2021-11-27 DIAGNOSIS — H02402 Unspecified ptosis of left eyelid: Secondary | ICD-10-CM | POA: Diagnosis not present

## 2021-11-27 DIAGNOSIS — H43812 Vitreous degeneration, left eye: Secondary | ICD-10-CM | POA: Diagnosis not present

## 2021-11-27 DIAGNOSIS — H5712 Ocular pain, left eye: Secondary | ICD-10-CM | POA: Diagnosis not present

## 2021-11-27 DIAGNOSIS — H2000 Unspecified acute and subacute iridocyclitis: Secondary | ICD-10-CM | POA: Diagnosis not present

## 2021-11-30 DIAGNOSIS — Z20822 Contact with and (suspected) exposure to covid-19: Secondary | ICD-10-CM | POA: Diagnosis not present

## 2021-12-03 DIAGNOSIS — M5412 Radiculopathy, cervical region: Secondary | ICD-10-CM | POA: Diagnosis not present

## 2021-12-07 DIAGNOSIS — H2000 Unspecified acute and subacute iridocyclitis: Secondary | ICD-10-CM | POA: Diagnosis not present

## 2021-12-07 DIAGNOSIS — H02402 Unspecified ptosis of left eyelid: Secondary | ICD-10-CM | POA: Diagnosis not present

## 2021-12-07 DIAGNOSIS — H2511 Age-related nuclear cataract, right eye: Secondary | ICD-10-CM | POA: Diagnosis not present

## 2021-12-24 ENCOUNTER — Ambulatory Visit (INDEPENDENT_AMBULATORY_CARE_PROVIDER_SITE_OTHER): Payer: Medicare Other | Admitting: Pulmonary Disease

## 2021-12-24 ENCOUNTER — Encounter: Payer: Self-pay | Admitting: Pulmonary Disease

## 2021-12-24 VITALS — BP 118/68 | HR 70 | Temp 97.7°F | Ht 66.5 in | Wt 141.6 lb

## 2021-12-24 DIAGNOSIS — J302 Other seasonal allergic rhinitis: Secondary | ICD-10-CM | POA: Diagnosis not present

## 2021-12-24 DIAGNOSIS — J3089 Other allergic rhinitis: Secondary | ICD-10-CM | POA: Diagnosis not present

## 2021-12-24 DIAGNOSIS — K219 Gastro-esophageal reflux disease without esophagitis: Secondary | ICD-10-CM

## 2021-12-24 DIAGNOSIS — J4520 Mild intermittent asthma, uncomplicated: Secondary | ICD-10-CM | POA: Diagnosis not present

## 2021-12-24 NOTE — Patient Instructions (Signed)
Continue Arnuity.  We will see you in follow-up in 6 months time call sooner should any new difficulties arise.

## 2021-12-24 NOTE — Progress Notes (Signed)
Subjective:    Patient ID: Leslie Dixon, female    DOB: 11/04/1945, 76 y.o.   MRN: [id number] Patient Care Team: Janie Morning, DO as PCP - General (Family Medicine) Heath Lark, MD as Consulting Physician (Hematology and Oncology) Suella Broad, MD as Consulting Physician (Physical Medicine and Rehabilitation) Myrlene Broker, MD as Attending Physician (Urology) Tyler Pita, MD as Referring Physician (Physical Medicine and Rehabilitation) Katy Apo, MD as Consulting Physician (Ophthalmology) Juanda Chance, NP as Nurse Practitioner (Obstetrics and Gynecology) Druscilla Brownie, MD as Consulting Physician (Dermatology) Carloyn Manner, MD as Referring Physician (Otolaryngology) Renato Shin, MD (Inactive) as Consulting Physician (Endocrinology) Katy Apo, MD as Consulting Physician (Ophthalmology) Laurence Spates, MD (Inactive) as Consulting Physician (Gastroenterology)  Chief Complaint  Patient presents with   Follow-up    C/o dry cough.    HPI Patient is a 76 year old lifelong never smoker with mild intermittent asthma.  She was last seen here on 08 July 2021.  This is a scheduled visit.  She has not had any major exacerbations in the interim .  She is an avid gardener and has had no difficulties working in her garden which usually triggers her. She does wear a mask or bandanna while gardening.  She has not had any fevers, chills or sweats.  No cough or sputum production.  She has issues with laryngopharyngeal reflux and vocal cord dysfunction however these have been controlled with PPI and nasal hygiene for control of her chronic rhinosinusitis.  She is followed by ENT as well.  On the average she only needs to use albuterol perhaps once or twice a month however here of late she has not needed it in several months.  She is on [employer] which is controlling her well.  Does not endorse any other symptomatology.  Overall she feels well and looks  well.   Review of Systems A 10 point review of systems was performed and it is as noted above otherwise negative.  Patient Active Problem List   Diagnosis Date Noted   Mild intermittent asthma without complication 86/76/7209   Rib contusion, right, initial encounter 05/29/2019   Elevated HDL 05/31/2018   Panic attack as reaction to stress 05/31/2018   Lumbar post-laminectomy syndrome 01/24/2018   Gastroesophageal reflux disease 12/28/2017   Chronic neck pain 12/28/2017   Osteoarthritis 12/28/2017   Cervical radiculopathy 10/24/2017   Chronic pain 10/19/2017   DDD (degenerative disc disease), cervical 10/19/2017   Irritable bowel syndrome with diarrhea 09/29/2017   Migraine without status migrainosus, not intractable 09/29/2017   Family history of breast cancer- sister at age 46- new onset 02/23/2017   History of thyroid nodule- R side ( Dr Loanne Drilling eval in 10/17) 02/23/2017   Status post lumbar surgery- foot drop R foot 02/23/2017   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   Right thyroid nodule 05/28/2016   Intolerance to cold 05/02/2016   History of non anemic vitamin B12 deficiency 05/02/2016   Environmental and seasonal allergies 04/20/2016   h/o GAD (had panic in past) 02/12/2016   GERD (gastroesophageal reflux disease)    Osteoporosis    Chronic back pain    Idiopathic benign Leukopenia    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   HYPOKALEMIA 01/03/2008   Allergic rhinitis due to pollen 12/13/2007   VOCAL CORD DISORDER 06/09/2007   LACTOSE INTOLERANCE 03/07/2007   Ocular migraine 03/07/2007   CARPAL TUNNEL SYNDROME 03/07/2007   VARICOSE VEIN 03/07/2007   NEPHROLITHIASIS, HX OF 03/07/2007   Social History  Tobacco Use   Smoking status: Never   Smokeless tobacco: Never  Substance Use Topics   Alcohol use: No   Allergies  Allergen Reactions   Peanut Oil Anaphylaxis   Singulair [Montelukast]     Facial swelling   Aspartame And Phenylalanine    Clarithromycin      REACTION: GI   Codeine Other (See Comments)    REACTION: nausea and vomiting   Lactose     Other reaction(s): GI Upset (intolerance)   Meloxicam Other (See Comments)    Face swelling   Other     GLUTEN RESTRICTED LACTOSE INTOLERANT ALLERGIC TO NUTS AND CORN   Penicillins     REACTION: per allergy testing/Patient states she has taken Augmentin without complications.    Pregabalin     REACTION: headache, several side effects   Sulfa Antibiotics Other (See Comments)    REACTION: Hives, wheezing   Sulfonamide Derivatives     REACTION: Hives, wheezing   Cefdinir Rash   Glycerin Other (See Comments)    Migraines, eye problems   Current Meds  Medication Sig   albuterol (VENTOLIN HFA) 108 (90 Base) MCG/ACT inhaler Inhale 2 puffs into the lungs every 6 (six) hours as needed for wheezing or shortness of breath.   Cholecalciferol (VITAMIN D-3) 125 MCG (5000 UT) TABS Take 1 tablet by mouth daily.   Fluticasone Furoate (ARNUITY ELLIPTA) 100 MCG/ACT AEPB Inhale 1 puff into the lungs daily.   loratadine (CLARITIN) 10 MG tablet Take 10 mg by mouth daily.   meclizine (ANTIVERT) 12.5 MG tablet SMARTSIG:1 Tablet(s) By Mouth Every 12 Hours PRN   omeprazole (PRILOSEC) 40 MG capsule Take 1 capsule (40 mg total) by mouth 2 (two) times daily.   triamcinolone (NASACORT) 55 MCG/ACT AERO nasal inhaler Place 2 sprays into the nose daily.   TURMERIC PO Take 2,000 Units by mouth daily at 8 pm.   vitamin B-12 (CYANOCOBALAMIN) 1000 MCG tablet Take 1,000 mcg by mouth every other day.   vitamin E 400 UNIT capsule Take 800 Units by mouth daily.    [DISCONTINUED] methylPREDNISolone (MEDROL DOSEPAK) 4 MG TBPK tablet Use as directed   [DISCONTINUED] SUMAtriptan (IMITREX) 100 MG tablet TAKE 1 2 (ONE HALF) TABLET BY MOUTH AS NEEDED FOR MIGRAINE   Immunization History  Administered Date(s) Administered   DT (Pediatric) 08/17/2010   DTaP 08/17/2010   Fluad Quad(high Dose 65+) 04/25/2019   Influenza Whole  04/30/2004, 04/26/2007   Influenza, High Dose Seasonal PF 05/13/2016, 05/10/2017, 04/25/2018, 04/15/2020   Influenza, Quadrivalent, Recombinant, Inj, Pf 05/05/2021   Influenza,inj,quad, With Preservative 04/25/2017   Influenza-Unspecified 06/07/2011, 04/24/2012   PFIZER(Purple Top)SARS-COV-2 Vaccination 08/13/2019, 09/06/2019, 05/16/2020   Pneumococcal Conjugate-13 04/20/2016   Pneumococcal Polysaccharide-23 04/30/2004, 07/29/2008, 07/27/2010, 04/20/2016   Td 07/26/2002, 11/19/2003   Tdap 08/17/2010, 04/20/2016   Zoster Recombinat (Shingrix) 04/25/2018, 09/11/2018   Zoster, Live 07/27/2007        Objective:   Physical Exam BP 118/68 (BP Location: Left Arm, Cuff Size: Normal)   Pulse 70   Temp 97.7 F (36.5 C) (Temporal)   Ht 5' 6.5" (1.689 m)   Wt 141 lb 9.6 oz (64.2 kg)   SpO2 97%   BMI 22.51 kg/m  GENERAL: Well-developed well-nourished woman in no acute distress.Fully ambulatory.No conversational dyspnea. HEAD: Normocephalic, atraumatic. EYES: Pupils equal, round, reactive to light.  No scleral icterus. MOUTH: Oral mucosa moist.  No thrush. NECK: Supple. No thyromegaly. Trachea midline. No JVD.  No adenopathy. PULMONARY: Good air entry bilaterally.  No  adventitious sounds. CARDIOVASCULAR: S1 and S2. Regular rate and rhythm.  No rubs, murmurs or gallops heard. ABDOMEN: Benign. MUSCULOSKELETAL: No joint deformity, no clubbing, no edema. NEUROLOGIC: No focal deficits, no gait disturbance, speech is fluent. SKIN: Intact,warm,dry.  Limited exam: No rashes. PSYCH: Mood and behavior normal      Assessment & Plan:     ICD-10-CM   1. Mild intermittent asthma without complication  Z61.09    Doing well on Arnuity 100 Ellipta, continue same Continue albuterol as needed    2. Perennial allergic rhinitis with seasonal variation  J30.89    J30.2    Continue nasal hygiene Follows with ENT    3. Laryngopharyngeal reflux (LPR)  K21.9    Controlled on PPI     Patient is doing  well.  Will see her in follow-up in 6 months time she is to contact us prior to that time should any new difficulties arise.  Renold Don, MD Advanced Bronchoscopy PCCM [facility]    *This note was dictated using voice recognition software/Dragon.  Despite best efforts to proofread, errors can occur which can change the meaning. Any transcriptional errors that result from this process are unintentional and may not be fully corrected at the time of dictation.

## 2021-12-31 DIAGNOSIS — R002 Palpitations: Secondary | ICD-10-CM | POA: Diagnosis not present

## 2021-12-31 DIAGNOSIS — I959 Hypotension, unspecified: Secondary | ICD-10-CM | POA: Diagnosis not present

## 2022-01-01 ENCOUNTER — Ambulatory Visit: Payer: Medicare Other | Admitting: Podiatry

## 2022-01-11 DIAGNOSIS — H1032 Unspecified acute conjunctivitis, left eye: Secondary | ICD-10-CM | POA: Diagnosis not present

## 2022-01-11 DIAGNOSIS — S0502XA Injury of conjunctiva and corneal abrasion without foreign body, left eye, initial encounter: Secondary | ICD-10-CM | POA: Diagnosis not present

## 2022-01-18 DIAGNOSIS — S0502XD Injury of conjunctiva and corneal abrasion without foreign body, left eye, subsequent encounter: Secondary | ICD-10-CM | POA: Diagnosis not present

## 2022-01-19 ENCOUNTER — Ambulatory Visit (INDEPENDENT_AMBULATORY_CARE_PROVIDER_SITE_OTHER): Payer: Medicare Other | Admitting: Podiatry

## 2022-01-19 ENCOUNTER — Ambulatory Visit (INDEPENDENT_AMBULATORY_CARE_PROVIDER_SITE_OTHER): Payer: Medicare Other

## 2022-01-19 DIAGNOSIS — M79672 Pain in left foot: Secondary | ICD-10-CM

## 2022-01-19 DIAGNOSIS — M7752 Other enthesopathy of left foot: Secondary | ICD-10-CM | POA: Diagnosis not present

## 2022-01-19 DIAGNOSIS — M778 Other enthesopathies, not elsewhere classified: Secondary | ICD-10-CM

## 2022-01-22 ENCOUNTER — Other Ambulatory Visit: Payer: Self-pay | Admitting: Podiatry

## 2022-01-22 DIAGNOSIS — M778 Other enthesopathies, not elsewhere classified: Secondary | ICD-10-CM

## 2022-01-26 NOTE — Progress Notes (Signed)
Subjective:   Patient ID: Leslie Dixon, female   DOB: 76 y.o.   MRN: [id number]   HPI 76 year old female presents the office with concerns of left foot and ankle injury which occurred in February 2023.  She states that she had her dog when she sprained it.  Did get better but now the pain is returned.  She states that she has been wearing "crummy" shoes which may be aggravating her symptoms.  She states that she was advised this morning did not have any pain.  She has more high impact activities as well as she gets discomfort.   Review of Systems  All other systems reviewed and are negative.  Past Medical History:  Diagnosis Date   Allergy    Arthritis    Asthma    Blood dyscrasia    LEUKOPENIA- DR. CHISM - NO TREATMENT BUT IS MONITORED   Cataract    Chronic back pain    Chronic back pain    Fibromyalgia    Foot drop, right    RELATED TO LUMBAR PROBLEMS   GERD (gastroesophageal reflux disease)    PRILOSEC PRN   History of kidney stones    Horseshoe kidney    BILATERAL   Internal hemorrhoids    Leukopenia    Migraine    OCCULAR MIGRAINES - AURA WITH EYES   Osteoporosis    Pain    LUMBAR PAIN - RECENT FALL BECAUSE LEGS GAVE OUT - PT HAS HAD LUMBAR INJECTION SINCE THE FALL THAT HAS HELPED BACK PAIN   Pain    CHRONIC RIGHT UPPER QUADRANT PAIN - RELATED TO GALLBLADDER PROBLEM   Sciatica of right side    Sinusitis    f/u with Dr. Janace Hoard.    Vertigo     Past Surgical History:  Procedure Laterality Date   [place]   BILATERAL SALPINGOOPHORECTOMY  2000   BREAST EXCISIONAL BIOPSY Right 1979   CHOLECYSTECTOMY N/A 04/12/2014   Procedure: LAPAROSCOPIC CHOLECYSTECTOMY WITH INTRAOPERATIVE CHOLANGIOGRAM;  Surgeon: Kaylyn Lim, MD;  Location: [facility];  Service: General;  Laterality: N/A;   Lumbar/cervical surgeries     CERVICAL FUSION C7-C6 WITH BONE GRAFT; C6-C5 WITH PLATE AND 4 SCREWS;  3 LUMBAR SURGERIES BUT NO FUSION   NASAL SINUS  SURGERY  2010   right knee     ARTHROSCOPY   TONSILLECTOMY     TYMPANOSTOMY TUBE PLACEMENT  07/2017   WISDOM TOOTH PULLED       Current Outpatient Medications:    albuterol (VENTOLIN HFA) 108 (90 Base) MCG/ACT inhaler, Inhale 2 puffs into the lungs every 6 (six) hours as needed for wheezing or shortness of breath., Disp: 54 g, Rfl: 1   Cholecalciferol (VITAMIN D-3) 125 MCG (5000 UT) TABS, Take 1 tablet by mouth daily., Disp: , Rfl:    Fluticasone Furoate (ARNUITY ELLIPTA) 100 MCG/ACT AEPB, Inhale 1 puff into the lungs daily., Disp: 90 each, Rfl: 4   loratadine (CLARITIN) 10 MG tablet, Take 10 mg by mouth daily., Disp: , Rfl:    meclizine (ANTIVERT) 12.5 MG tablet, SMARTSIG:1 Tablet(s) By Mouth Every 12 Hours PRN, Disp: , Rfl:    omeprazole (PRILOSEC) 40 MG capsule, Take 1 capsule (40 mg total) by mouth 2 (two) times daily., Disp: 180 capsule, Rfl: 3   triamcinolone (NASACORT) 55 MCG/ACT AERO nasal inhaler, Place 2 sprays into the nose daily., Disp: , Rfl:    TURMERIC PO, Take 2,000 Units by mouth daily at  8 pm., Disp: , Rfl:    vitamin B-12 (CYANOCOBALAMIN) 1000 MCG tablet, Take 1,000 mcg by mouth every other day., Disp: , Rfl:    vitamin E 400 UNIT capsule, Take 800 Units by mouth daily. , Disp: , Rfl:   Allergies  Allergen Reactions   Peanut Oil Anaphylaxis   Singulair [Montelukast]     Facial swelling   Aspartame And Phenylalanine    Clarithromycin     REACTION: GI   Codeine Other (See Comments)    REACTION: nausea and vomiting   Lactose     Other reaction(s): GI Upset (intolerance)   Meloxicam Other (See Comments)    Face swelling   Other     GLUTEN RESTRICTED LACTOSE INTOLERANT ALLERGIC TO NUTS AND CORN   Penicillins     REACTION: per allergy testing/Patient states she has taken Augmentin without complications.    Pregabalin     REACTION: headache, several side effects   Sulfa Antibiotics Other (See Comments)    REACTION: Hives, wheezing   Sulfonamide Derivatives      REACTION: Hives, wheezing   Cefdinir Rash   Glycerin Other (See Comments)    Migraines, eye problems          Objective:  Physical Exam  General: AAO x3, NAD  Dermatological: Skin is warm, dry and supple bilateral. There are no open sores, no preulcerative lesions, no rash or signs of infection present.  Vascular: Dorsalis Pedis artery and Posterior Tibial artery pedal pulses are 2/4 bilateral with immedate capillary fill time.There is no pain with calf compression, swelling, warmth, erythema.   Neruologic: Grossly intact via light touch bilateral.   Musculoskeletal: Tenderness present on the course of the tendon from the ankle on the lateral aspect.  There is no specific area pinpoint tenderness.  Ankle, subtalar joint range of motion Without any restrictions.  Clinically the tendons appear to be intact.  No pain on the Achilles tendon today.  Muscular strength 5/5 in all groups tested bilateral.  Gait: Unassisted, Nonantalgic.        Assessment:   Tendinitis, sprain left ankle     Plan:  -Treatment options discussed including all alternatives, risks, and complications -Etiology of symptoms were discussed -X-rays were obtained and reviewed with the patient.  3 views of left foot and ankle were obtained.  No evidence of acute fracture.  There is some edema present in the fibula but no definitive fracture is present. -Trilock ankle brace -Discussed stretching/icing daily -Discussed anti-inflammatory medications but she can also use voltagen gel. -Supportive shoes -If no improvement, PT   Trula Slade DPM

## 2022-02-08 DIAGNOSIS — H15102 Unspecified episcleritis, left eye: Secondary | ICD-10-CM | POA: Diagnosis not present

## 2022-02-08 DIAGNOSIS — T1512XA Foreign body in conjunctival sac, left eye, initial encounter: Secondary | ICD-10-CM | POA: Diagnosis not present

## 2022-02-08 DIAGNOSIS — H2000 Unspecified acute and subacute iridocyclitis: Secondary | ICD-10-CM | POA: Diagnosis not present

## 2022-02-08 DIAGNOSIS — H02402 Unspecified ptosis of left eyelid: Secondary | ICD-10-CM | POA: Diagnosis not present

## 2022-02-23 DIAGNOSIS — I483 Typical atrial flutter: Secondary | ICD-10-CM | POA: Diagnosis not present

## 2022-02-24 ENCOUNTER — Other Ambulatory Visit: Payer: Self-pay | Admitting: Family Medicine

## 2022-02-24 ENCOUNTER — Ambulatory Visit (INDEPENDENT_AMBULATORY_CARE_PROVIDER_SITE_OTHER): Payer: Medicare Other | Admitting: Gastroenterology

## 2022-02-24 ENCOUNTER — Encounter: Payer: Self-pay | Admitting: Gastroenterology

## 2022-02-24 VITALS — BP 114/66 | HR 58 | Ht 66.5 in | Wt 138.0 lb

## 2022-02-24 DIAGNOSIS — K2950 Unspecified chronic gastritis without bleeding: Secondary | ICD-10-CM

## 2022-02-24 DIAGNOSIS — K2100 Gastro-esophageal reflux disease with esophagitis, without bleeding: Secondary | ICD-10-CM | POA: Diagnosis not present

## 2022-02-24 DIAGNOSIS — Z1231 Encounter for screening mammogram for malignant neoplasm of breast: Secondary | ICD-10-CM

## 2022-02-24 MED ORDER — OMEPRAZOLE 40 MG PO CPDR: 40.0000 mg | DELAYED_RELEASE_CAPSULE | Freq: Two times a day (BID) | ORAL | 3 refills | Status: DC

## 2022-02-24 MED ORDER — FAMOTIDINE 20 MG PO TABS: 20.0000 mg | ORAL_TABLET | Freq: Two times a day (BID) | ORAL | 3 refills | Status: DC

## 2022-02-24 NOTE — Patient Instructions (Addendum)
It was a pleasure to see you today.  We discussed strategies for managing your symptoms. You can use omeprazole, famotidine, or both of them today. For example, you can use omeprazole 20 mg daily or twice daily as needed. You may add famotidine 20 mg twice daily to the omeprazole or substitute famotidine for the omeprazole if you are having any new side effects.   Be cautious with tumeric - as this can trigger GI upset.   You should also avoid (or at least minimize) foods that may trigger reflux. These include, but are not limited to, fried foods, high-fat content foods, spicy foods, tomato-based foods, diary products, caffeine, chocolate, and alcohol. Decreasing stress, maintaining a healthy weight, and avoiding late night eating may also be beneficial. I recommend staying upright for 2-3 hours after eating. The cookbook "Dropping Acid: The Reflux Diet Cookbook & Cure" by Vincent Gros, MD and Martinique Stern, MD is a good reference for low acid foods.    Follow up in the office with Dr Tarri Glenn in 1 year (if continuing omeprazole therapy); sooner if needed.  _______________________________________________________  If you are age 62 or older, your body mass index should be between 23-30. Your Body mass index is 21.94 kg/m. If this is out of the aforementioned range listed, please consider follow up with your Primary Care Provider.  ________________________________________________________  The [facility] providers would like to encourage you to use [facility] to communicate with providers for non-urgent requests or questions.  Due to long hold times on the telephone, sending your provider a message by [facility] may be a faster and more efficient way to get a response.  Please allow 48 business hours for a response.  Please remember that this is for non-urgent requests.  _______________________________________________________  Due to recent changes in healthcare laws, you may see the results of your  imaging and laboratory studies on MyChart before your provider has had a chance to review them.  We understand that in some cases there may be results that are confusing or concerning to you. Not all laboratory results come back in the same time frame and the provider may be waiting for multiple results in order to interpret others.  Please give [place] 48 hours in order for your provider to thoroughly review all the results before contacting the office for clarification of your results.   It was a pleasure to see you today!  Thank you for trusting me with your gastrointestinal care!

## 2022-02-24 NOTE — Progress Notes (Signed)
Referring Provider: Janie Morning, DO Primary Care Physician:  Janie Morning, DO  Chief complaint:  Questions about medications   IMPRESSION:  Reflux and gastritis on recent EGD presenting with epigastric pain, nausea, bloating    - symptoms initially resolved on PPI    - did not tolerate PPI therapy due to chest pain GERD-related dysphagia to solids    - symptoms resolved on PPI Abnormal esophageal motility noted on EGD with Dr. 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 MWatt Climes    - gastric biopsies on EGD 04/20/21 showed no atrophic gastritis, IM limited to antrum    - no family history of gastric cancer, no identified risks for gastric cancer    - surveillance not indicated at this time Prior history of NSAIDs Family history of colon polyps (brother) Cologuard negative 08/2020 Prior colonoscopy with Dr. EOletta Lamasin 2012    PLAN: - Continue PPI  - Discussed using famotidine 20 mg BID instead of PPI or in addition to PPI for breakthrough symptoms - Follow-up in the office PRN, at least annually if using PPI therapy   HPI: Leslie Dixon a 76y.o. female who returns in scheduled follow-up. She was last seen 05/15/21. The interval history is obtained through the patient, review of her electronic health record, and prior records from [place] She has a history of mild intermittent asthma, seasonal allergies, LPR, ocular migraines, benign thyroid nodule, osteoporosis. She reports a history of IBS and cholecystectomy.    Evaluated in September 2022 for severe, sharp epigastric abdominal pain with associated nausea and bloating that started in June 2022 not responding to omeprazole BID and famotidine 20 mg QHS. History of meloxicam use, but not for over a year.  She was concerned about a bleeding ulcer because of the severity of her symptoms.   Chronic GI symptoms  including a longstanding history of dysphagia to bread. Some choking. Previously on tumeric. No dysphagia to liquids. Symptoms improved after dilation with Dr. MWatt Climesin 2019.   EGD 04/20/21 mild gastritis. Empiric dilation was performed with TTS 16-160mballoon. showed reactive gastropathy with focal intestinal metaplasia in the antrum, reactive gastropathy, and reflux. Duodenal biopsies were normal.   On office visit 07/24/21 she reported symptomatic improvement on omeprazole 40 mg BID. She feels the empiric dilation helped. However, she has had headaches, particularly involving her right eye, increased cough, arthralgias, and dizziness that she attributes to omeprazole. She is also very concerned about the long term risks of PPI therapy. Her daughter is a NP and has encouraged her to continue with PPI therapy, at least in the short term. We discussed switching to an alternative PPI, but, she decided to complete the omeprazole that she had on hand.  Reduced PPI to once daily over Thanksgiving. Discontinued all together over the last couple of weeks without any worsening in symptoms. Has identified some foods that trigger her symptoms, particularly broccoli. But, otherwise she feels like her symptoms are well controlled. She does not feel that additional treatment is indicated at this time. GI ROS is otherwise negative.   Returns today in follow-up. Reduced side effects on a lower dose of omeprazole with concurrent apple cider vinegar. She successfully tapered off the omeprazole.  She was symptom free for a month.  Symptoms are now recurring but it has taken her a couple of months to  get in for this appointment. She resumed tumeric at a lower dose. Symptoms are triggered most by greasy and acidic foods.   Prior endoscopic evaluation: - Colonoscopy with Dr. Oletta Lamas 2012: internal hemorrhoids - Upper endoscopy with Dr. Oletta Lamas in 2015 - no biopsies obtained at that time - Upper endoscopy with Dr. 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  There was no H. pylori.  No dysplasia. - EGD 04/20/21: reactive gastropathy with focal intestinal metaplasia in the antrum, reactive gastropathy, and reflux. Duodenal biopsies were normal.   Recent pertinent labs: - Cologuard negative 09/10/2020 - Labs 12/07/19 showed a normal BMP. Unable to located additional labs.    Past Medical History:  Diagnosis Date   Allergy    Arthritis    Asthma    Blood dyscrasia    LEUKOPENIA- DR. CHISM - NO TREATMENT BUT IS MONITORED   Cataract    Chronic back pain    Chronic back pain    Fibromyalgia    Foot drop, right    RELATED TO LUMBAR PROBLEMS   GERD (gastroesophageal reflux disease)    PRILOSEC PRN   History of kidney stones    Horseshoe kidney    BILATERAL   Internal hemorrhoids    Leukopenia    Migraine    OCCULAR MIGRAINES - AURA WITH EYES   Osteoporosis    Pain    LUMBAR PAIN - RECENT FALL BECAUSE LEGS GAVE OUT - PT HAS HAD LUMBAR INJECTION SINCE THE FALL THAT HAS HELPED BACK PAIN   Pain    CHRONIC RIGHT UPPER QUADRANT PAIN - RELATED TO GALLBLADDER PROBLEM   Sciatica of right side    Sinusitis    f/u with Dr. Janace Hoard.    Vertigo     Past Surgical History:  Procedure Laterality Date   ABDOMINAL HYSTERECTOMY  1983   APPENDECTOMY  1968   BILATERAL SALPINGOOPHORECTOMY  2000   BREAST EXCISIONAL BIOPSY Right 1979   CHOLECYSTECTOMY N/A 04/12/2014   Procedure: LAPAROSCOPIC CHOLECYSTECTOMY WITH INTRAOPERATIVE CHOLANGIOGRAM;  Surgeon: Kaylyn Lim, MD;  Location: [facility];  Service: General;  Laterality: N/A;   Lumbar/cervical surgeries     CERVICAL FUSION C7-C6 WITH BONE GRAFT; C6-C5 WITH PLATE AND 4 SCREWS;  3 LUMBAR  SURGERIES BUT NO FUSION   NASAL SINUS SURGERY  2010   right knee     ARTHROSCOPY   TONSILLECTOMY     TYMPANOSTOMY TUBE PLACEMENT  07/2017   WISDOM TOOTH PULLED        Current Outpatient Medications  Medication Sig Dispense Refill   albuterol (VENTOLIN HFA) 108 (90 Base) MCG/ACT inhaler Inhale 2 puffs into the lungs every 6 (six) hours as needed for wheezing or shortness of breath. 54 g 1   Cholecalciferol (VITAMIN D-3) 125 MCG (5000 UT) TABS Take 1 tablet by mouth daily.     Fluticasone Furoate (ARNUITY ELLIPTA) 100 MCG/ACT AEPB Inhale 1 puff into the lungs daily. 90 each 4   loratadine (CLARITIN) 10 MG tablet Take 10 mg by mouth daily.     meclizine (ANTIVERT) 12.5 MG tablet SMARTSIG:1 Tablet(s) By Mouth Every 12 Hours PRN     omeprazole (PRILOSEC) 40 MG capsule Take 1 capsule (40 mg total) by mouth 2 (two) times daily. 180 capsule 3   triamcinolone (NASACORT)  55 MCG/ACT AERO nasal inhaler Place 2 sprays into the nose daily.     TURMERIC PO Take 2,000 Units by mouth daily at 8 pm.     vitamin B-12 (CYANOCOBALAMIN) 1000 MCG tablet Take 1,000 mcg by mouth every other day.     vitamin E 400 UNIT capsule Take 800 Units by mouth daily.      No current facility-administered medications for this visit.    Allergies as of 02/24/2022 - Review Complete 02/24/2022  Allergen Reaction Noted   Peanut oil Anaphylaxis 05/06/2011   Singulair [montelukast]  09/06/2018   Aspartame and phenylalanine  05/15/2021   Clarithromycin  03/07/2007   Codeine Other (See Comments) 03/07/2007   Lactose  05/06/2011   Meloxicam Other (See Comments) 03/09/2021   Other  04/05/2014   Penicillins  03/07/2007   Pregabalin  06/09/2007   Sulfa antibiotics Other (See Comments) 10/02/2014   Sulfonamide derivatives  03/07/2007   Cefdinir Rash 09/30/2016   Glycerin Other (See Comments) 05/15/2021      Physical Exam: General:   Alert,  well-nourished, pleasant and cooperative in NAD Head:  Normocephalic and  atraumatic. Eyes:  Sclera clear, no icterus.   Conjunctiva pink. Abdomen:  Soft, nontender, nondistended, normal bowel sounds, no rebound or guarding. No hepatosplenomegaly.   Neurologic:  Alert and  oriented x4;  grossly nonfocal Skin:  Intact without significant lesions or rashes. Psych:  Alert and cooperative. Normal mood and affect.    Onie Kasparek L. Tarri Glenn, MD, MPH 02/24/2022, 11:00 AM

## 2022-03-02 ENCOUNTER — Telehealth: Payer: Self-pay | Admitting: Gastroenterology

## 2022-03-02 NOTE — Telephone Encounter (Signed)
Patient called states  she was given wrong dosage of Prilosec 40 mg medication. Please call to advise

## 2022-03-03 ENCOUNTER — Other Ambulatory Visit: Payer: Self-pay | Admitting: Pulmonary Disease

## 2022-03-03 MED ORDER — OMEPRAZOLE 20 MG PO CPDR: 20.0000 mg | DELAYED_RELEASE_CAPSULE | Freq: Every day | ORAL | 3 refills | Status: AC

## 2022-03-03 NOTE — Telephone Encounter (Signed)
Patient needed a refill on Omeprazole 20 mg. Script sent to [facility].

## 2022-03-05 ENCOUNTER — Telehealth: Payer: Self-pay | Admitting: Gastroenterology

## 2022-03-05 NOTE — Telephone Encounter (Signed)
Spoke with [facility] and confirmed patient is suppose to be Omeprazole 20 mg. Patient informed.

## 2022-03-05 NOTE — Telephone Encounter (Signed)
PT is calling about Omeprazole RX. Trying to get it filled but the insurance wont cover it because its for 40 when it should be for 20. [facility] has to speak with dr in order to fill. [phone]. Please advise.

## 2022-03-09 DIAGNOSIS — M5416 Radiculopathy, lumbar region: Secondary | ICD-10-CM | POA: Diagnosis not present

## 2022-03-23 ENCOUNTER — Ambulatory Visit: Payer: Medicare Other | Admitting: Nurse Practitioner

## 2022-04-01 ENCOUNTER — Ambulatory Visit
Admission: RE | Admit: 2022-04-01 | Discharge: 2022-04-01 | Disposition: A | Discharge status: Home / Self Care | Payer: Medicare Other | Source: Ambulatory Visit | Attending: Family Medicine | Admitting: Family Medicine

## 2022-04-01 DIAGNOSIS — Z1231 Encounter for screening mammogram for malignant neoplasm of breast: Secondary | ICD-10-CM | POA: Diagnosis not present

## 2022-04-07 DIAGNOSIS — Z961 Presence of intraocular lens: Secondary | ICD-10-CM | POA: Diagnosis not present

## 2022-04-07 DIAGNOSIS — H2012 Chronic iridocyclitis, left eye: Secondary | ICD-10-CM | POA: Diagnosis not present

## 2022-04-07 DIAGNOSIS — H5201 Hypermetropia, right eye: Secondary | ICD-10-CM | POA: Diagnosis not present

## 2022-04-07 DIAGNOSIS — H25011 Cortical age-related cataract, right eye: Secondary | ICD-10-CM | POA: Diagnosis not present

## 2022-04-07 DIAGNOSIS — H2511 Age-related nuclear cataract, right eye: Secondary | ICD-10-CM | POA: Diagnosis not present

## 2022-04-09 DIAGNOSIS — H6980 Other specified disorders of Eustachian tube, unspecified ear: Secondary | ICD-10-CM | POA: Diagnosis not present

## 2022-04-09 DIAGNOSIS — H903 Sensorineural hearing loss, bilateral: Secondary | ICD-10-CM | POA: Diagnosis not present

## 2022-04-22 ENCOUNTER — Ambulatory Visit (INDEPENDENT_AMBULATORY_CARE_PROVIDER_SITE_OTHER): Payer: Medicare Other | Admitting: Nurse Practitioner

## 2022-04-22 ENCOUNTER — Encounter: Payer: Self-pay | Admitting: Nurse Practitioner

## 2022-04-22 VITALS — BP 113/60 | HR 65 | Ht 66.5 in | Wt 141.4 lb

## 2022-04-22 DIAGNOSIS — M961 Postlaminectomy syndrome, not elsewhere classified: Secondary | ICD-10-CM

## 2022-04-22 DIAGNOSIS — G8929 Other chronic pain: Secondary | ICD-10-CM | POA: Diagnosis not present

## 2022-04-22 DIAGNOSIS — K219 Gastro-esophageal reflux disease without esophagitis: Secondary | ICD-10-CM | POA: Diagnosis not present

## 2022-04-22 DIAGNOSIS — Z13820 Encounter for screening for osteoporosis: Secondary | ICD-10-CM

## 2022-04-22 DIAGNOSIS — E2839 Other primary ovarian failure: Secondary | ICD-10-CM

## 2022-04-22 DIAGNOSIS — Z7689 Persons encountering health services in other specified circumstances: Secondary | ICD-10-CM | POA: Diagnosis not present

## 2022-04-22 DIAGNOSIS — M542 Cervicalgia: Secondary | ICD-10-CM

## 2022-04-22 NOTE — Progress Notes (Signed)
New Patient Office Visit  Subjective    Patient ID: Leslie Dixon, female    DOB: 08-09-1945  Age: 76 y.o. MRN: [id number]  CC:  Chief Complaint  Patient presents with   New Patient (Initial Visit)    HPI Leslie Dixon presents to establish care Reestablishing care. Was patient of Dr. Raliegh Scarlet.  Did see pcp prior to that.  -treated for severe GERD per GI provider.  -was taking a lot of prilosec and cutting this back and eliminating acidic foods helped improve the GERD.  -had last [place] and lab work the last week of Deceomber 2022.  -history of lumbar disc disease and osteoarthritis - sees pain management through orthopedics  -history of osteoporosis. Due to have new bone density test  -horseshoe kidney. Sees urologist at [facility] yearly.   Outpatient Encounter Medications as of 04/22/2022  Medication Sig   albuterol (VENTOLIN HFA) 108 (90 Base) MCG/ACT inhaler Inhale 2 puffs into the lungs every 6 (six) hours as needed for wheezing or shortness of breath.   ARNUITY ELLIPTA 100 MCG/ACT AEPB USE 1 INHALATION DAILY   Cholecalciferol (VITAMIN D-3) 125 MCG (5000 UT) TABS Take 1 tablet by mouth daily.   famotidine (PEPCID) 20 MG tablet Take 1 tablet (20 mg total) by mouth 2 (two) times daily.   loratadine (CLARITIN) 10 MG tablet Take 10 mg by mouth daily.   meclizine (ANTIVERT) 12.5 MG tablet SMARTSIG:1 Tablet(s) By Mouth Every 12 Hours PRN   omeprazole (PRILOSEC) 20 MG capsule Take 1 capsule (20 mg total) by mouth daily.   triamcinolone (NASACORT) 55 MCG/ACT AERO nasal inhaler Place 2 sprays into the nose daily.   TURMERIC PO Take 2,000 Units by mouth daily at 8 pm.   vitamin B-12 (CYANOCOBALAMIN) 1000 MCG tablet Take 1,000 mcg by mouth every other day.   vitamin E 400 UNIT capsule Take 800 Units by mouth daily.    No facility-administered encounter medications on file as of 04/22/2022.    Past Medical History:  Diagnosis Date   Allergy    Arthritis    Asthma    Blood  dyscrasia    LEUKOPENIA- DR. CHISM - NO TREATMENT BUT IS MONITORED   Cataract    Chronic back pain    Chronic back pain    Fibromyalgia    Foot drop, right    RELATED TO LUMBAR PROBLEMS   GERD (gastroesophageal reflux disease)    PRILOSEC PRN   History of kidney stones    Horseshoe kidney    BILATERAL   Internal hemorrhoids    Leukopenia    Migraine    OCCULAR MIGRAINES - AURA WITH EYES   Osteoporosis    Pain    LUMBAR PAIN - RECENT FALL BECAUSE LEGS GAVE OUT - PT HAS HAD LUMBAR INJECTION SINCE THE FALL THAT HAS HELPED BACK PAIN   Pain    CHRONIC RIGHT UPPER QUADRANT PAIN - RELATED TO GALLBLADDER PROBLEM   Sciatica of right side    Sinusitis    f/u with Dr. Janace Hoard.    Vertigo     Past Surgical History:  Procedure Laterality Date   ABDOMINAL HYSTERECTOMY  1983   APPENDECTOMY  1968   BILATERAL SALPINGOOPHORECTOMY  2000   BREAST EXCISIONAL BIOPSY Right 1979   CHOLECYSTECTOMY N/A 04/12/2014   Procedure: LAPAROSCOPIC CHOLECYSTECTOMY WITH INTRAOPERATIVE CHOLANGIOGRAM;  Surgeon: Kaylyn Lim, MD;  Location: [facility];  Service: General;  Laterality: N/A;   Lumbar/cervical surgeries     CERVICAL FUSION  C7-C6 WITH BONE GRAFT; C6-C5 WITH PLATE AND 4 SCREWS;  3 LUMBAR SURGERIES BUT NO FUSION   NASAL SINUS SURGERY  2010   right knee     ARTHROSCOPY   TONSILLECTOMY     TYMPANOSTOMY TUBE PLACEMENT  07/2017   WISDOM TOOTH PULLED      Family History  Problem Relation Age of Onset   Neurodegenerative disease Mother    Arthritis Mother    Hypertension Father    Hyperlipidemia Father    Alcohol abuse Father    Lung cancer Father    Breast cancer Sister 57   Asthma Sister    Migraines Sister    Colon polyps Brother    Asthma Brother    Prostate cancer Brother    Breast cancer Maternal Aunt 85   Leukemia Maternal Grandfather    Thyroid disease Daughter    Asthma Son    Colon cancer Neg Hx    Esophageal cancer Neg Hx    Rectal cancer Neg Hx    Stomach cancer Neg Hx      Social History   Socioeconomic History   Marital status: Married    Spouse name: Morris   Number of children: 2   Years of education: College   Highest education level: Not on file  Occupational History    Comment: retired Press photographer  Tobacco Use   Smoking status: Never   Smokeless tobacco: Never  Vaping Use   Vaping Use: Never used  Substance and Sexual Activity   Alcohol use: No   Drug use: No   Sexual activity: Not Currently    Birth control/protection: Surgical  Other Topics Concern   Not on file  Social History Narrative   05/29/19   From: [place] area   Living: Lives with Husband Morris - x 40 years   Work: Retired from Radi[employer], 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      Family: Has 2 children ([place] and [place]),      Enjoys: play with dogs, volunteering as Engineer, materials, roses, vegetable garden, and butterfly garden      Exercise: Rides a stationary bike 5-7 days 2 miles, walking occasionally based on weather   Diet: avoids junk food, fried foods, veggies, avoids sugar      Safety   Seat belts: Yes    Guns: Yes  and secure   Safe in relationships: Yes    Social Determinants of Health   Financial Resource Strain: Low Risk  (10/11/2019)   Overall Financial Resource Strain (CARDIA)    Difficulty of Paying Living Expenses: Not hard at all  Food Insecurity: No Food Insecurity (10/11/2019)   Hunger Vital Sign    Worried About Running Out of Food in the Last Year: Never true    [place] in the Last Year: Never true  Transportation Needs: No Transportation Needs (10/11/2019)   PRAPARE - Hydrologist (Medical): No    Lack of Transportation (Non-Medical): No  Physical Activity: Sufficiently Active (10/11/2019)   Exercise Vital Sign    Days of Exercise per Week: 7 days    Minutes of Exercise per Session: 30 min  Stress: No Stress Concern Present (10/11/2019)   [place]    Feeling of Stress : Not at all  Social Connections: Not on file  Intimate Partner Violence: Not At Risk (10/11/2019)   Humiliation, Afraid, Rape, and  Kick questionnaire    Fear of Current or Ex-Partner: No    Emotionally Abused: No    Physically Abused: No    Sexually Abused: No    Review of Systems  Constitutional:  Negative for chills, fever and malaise/fatigue.  HENT:  Negative for congestion, sinus pain and sore throat.   Eyes: Negative.   Respiratory:  Negative for cough, shortness of breath and wheezing.   Cardiovascular:  Negative for chest pain, palpitations and leg swelling.  Gastrointestinal:  Negative for constipation, diarrhea, nausea and vomiting.  Genitourinary: Negative.   Musculoskeletal:  Positive for back pain and myalgias.  Skin: Negative.   Neurological:  Negative for dizziness and headaches.  Endo/Heme/Allergies:  Does not bruise/bleed easily.  Psychiatric/Behavioral:  Negative for depression. The patient is not nervous/anxious.         Objective    Today's Vitals   04/22/22 1038  BP: 113/60  Pulse: 65  SpO2: 99%  Weight: 141 lb 6.4 oz (64.1 kg)  Height: 5' 6.5" (1.689 m)   Body mass index is 22.48 kg/m.   Physical Exam Vitals and nursing note reviewed.  Constitutional:      Appearance: Normal appearance. She is well-developed.  HENT:     Head: Normocephalic and atraumatic.     Nose: Nose normal.     Mouth/Throat:     Mouth: Mucous membranes are moist.     Pharynx: Oropharynx is clear.  Eyes:     Extraocular Movements: Extraocular movements intact.     Conjunctiva/sclera: Conjunctivae normal.     Pupils: Pupils are equal, round, and reactive to light.  Cardiovascular:     Rate and Rhythm: Normal rate and regular rhythm.     Pulses: Normal pulses.     Heart sounds: Normal heart sounds.  Pulmonary:     Effort: Pulmonary effort is normal.     Breath sounds: Normal breath sounds.  Abdominal:      Palpations: Abdomen is soft.  Musculoskeletal:        General: Normal range of motion.     Cervical back: Normal range of motion and neck supple.  Lymphadenopathy:     Cervical: No cervical adenopathy.  Skin:    General: Skin is warm and dry.     Capillary Refill: Capillary refill takes less than 2 seconds.  Neurological:     General: No focal deficit present.     Mental Status: She is alert and oriented to person, place, and time.  Psychiatric:        Mood and Affect: Mood normal.        Behavior: Behavior normal.        Thought Content: Thought content normal.        Judgment: Judgment normal.       Assessment & Plan:  1. Estrogen deficiency New bone density test ordered today.  We will treat for osteoporosis as indicated. - DG Bone Density; Future  2. Screening for osteoporosis New bone density test ordered today.  We will treat for osteoporosis as indicated. - DG Bone Density; Future  3. Gastroesophageal reflux disease without esophagitis Stable.  Continue regular visits with GI as scheduled.  4. Chronic neck pain Patient sees pain management on routine basis.  5. Lumbar post-laminectomy syndrome Patient sees pain management on routine basis.  6. Encounter to establish care Appointment today to establish new primary care provider      Problem List Items Addressed This Visit       Digestive  Gastroesophageal reflux disease     Other   Chronic neck pain   Lumbar post-laminectomy syndrome   Estrogen deficiency - Primary   Relevant Orders   DG Bone Density   Other Visit Diagnoses     Screening for osteoporosis       Relevant Orders   DG Bone Density   Encounter to establish care           Return in about 3 months (around 07/22/2022) for medicare wellness, FBW at time of visit - should be 40 minute appointment . reg flu shot in october.   Ronnell Freshwater, NP

## 2022-04-29 DIAGNOSIS — M79672 Pain in left foot: Secondary | ICD-10-CM | POA: Diagnosis not present

## 2022-04-29 DIAGNOSIS — M25572 Pain in left ankle and joints of left foot: Secondary | ICD-10-CM | POA: Diagnosis not present

## 2022-04-30 ENCOUNTER — Other Ambulatory Visit: Payer: Self-pay | Admitting: Nurse Practitioner

## 2022-04-30 DIAGNOSIS — E2839 Other primary ovarian failure: Secondary | ICD-10-CM

## 2022-04-30 DIAGNOSIS — Z13820 Encounter for screening for osteoporosis: Secondary | ICD-10-CM

## 2022-05-02 DIAGNOSIS — E2839 Other primary ovarian failure: Secondary | ICD-10-CM | POA: Insufficient documentation

## 2022-05-05 DIAGNOSIS — Q631 Lobulated, fused and horseshoe kidney: Secondary | ICD-10-CM | POA: Diagnosis not present

## 2022-05-05 DIAGNOSIS — R935 Abnormal findings on diagnostic imaging of other abdominal regions, including retroperitoneum: Secondary | ICD-10-CM | POA: Diagnosis not present

## 2022-05-15 DIAGNOSIS — M79672 Pain in left foot: Secondary | ICD-10-CM | POA: Diagnosis not present

## 2022-05-15 DIAGNOSIS — M25572 Pain in left ankle and joints of left foot: Secondary | ICD-10-CM | POA: Diagnosis not present

## 2022-05-21 DIAGNOSIS — M79672 Pain in left foot: Secondary | ICD-10-CM | POA: Diagnosis not present

## 2022-05-21 DIAGNOSIS — M25572 Pain in left ankle and joints of left foot: Secondary | ICD-10-CM | POA: Diagnosis not present

## 2022-06-30 ENCOUNTER — Ambulatory Visit (INDEPENDENT_AMBULATORY_CARE_PROVIDER_SITE_OTHER): Payer: Medicare Other | Admitting: Pulmonary Disease

## 2022-06-30 ENCOUNTER — Encounter: Payer: Self-pay | Admitting: Pulmonary Disease

## 2022-06-30 VITALS — BP 110/70 | HR 71 | Temp 98.5°F | Ht 66.5 in | Wt 142.6 lb

## 2022-06-30 DIAGNOSIS — K219 Gastro-esophageal reflux disease without esophagitis: Secondary | ICD-10-CM

## 2022-06-30 DIAGNOSIS — J329 Chronic sinusitis, unspecified: Secondary | ICD-10-CM

## 2022-06-30 DIAGNOSIS — J4520 Mild intermittent asthma, uncomplicated: Secondary | ICD-10-CM | POA: Diagnosis not present

## 2022-06-30 LAB — NITRIC OXIDE: Nitric Oxide: 7

## 2022-06-30 MED ORDER — ARNUITY ELLIPTA 100 MCG/ACT IN AEPB: INHALATION_SPRAY | RESPIRATORY_TRACT | 3 refills | Status: DC

## 2022-06-30 NOTE — Patient Instructions (Signed)
I recommend that you get the RSV vaccine this year.  This can be obtained at either [facility] or [facility].  No inflammation in your airway today.  The Arnuity has been refilled.  We we will see you in follow-up in 6 months time call sooner should any new problems arise.

## 2022-06-30 NOTE — Progress Notes (Signed)
Subjective:    Patient ID: Leslie Dixon, female    DOB: [birth year], 76 y.o.   MRN: [id number] Patient Care Team: Janie Morning, DO as PCP - General (Family Medicine) Heath Lark, MD as Consulting Physician (Hematology and Oncology) Myrlene Broker, MD as Attending Physician (Urology) Katy Apo, MD as Consulting Physician (Ophthalmology) Juanda Chance, NP as Nurse Practitioner (Obstetrics and Gynecology) Druscilla Brownie, MD as Consulting Physician (Dermatology) Carloyn Manner, MD as Referring Physician (Otolaryngology) Katy Apo, MD as Consulting Physician (Ophthalmology)  Chief Complaint  Patient presents with   Follow-up    Doing good. No SOB.  Some wheezing. Dry cough.    HPI Patient is a 76 year old lifelong never smoker with mild intermittent asthma.  She was last seen here on  24 December 2021 by me.  This is a scheduled visit.  She has not had any major exacerbations in the interim.  Slight increase use of albuterol during November as she was working in her garden.  She is an avid gardener and working in her garden which usually triggers her particularly in the spring and fall of the year. She does wear a mask or bandanna while gardening.  She has not had any fevers, chills or sweats. Slight dry cough during early November however this has now cleared.  Again this was related to her working in the garden.  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  She is followed by ENT as well.  On the average she only needs to use albuterol perhaps once or twice a month last use however was early part of November and none since then. She is on Arnuity Ellipta 100 which is controlling her well.  She needs a refill today.  Does not endorse any other symptomatology.  She is up-to-date on influenza vaccine.  Overall she feels well and  looks well.    Review of Systems A 10 point review of systems was performed and it is as noted above otherwise negative.  Patient Active Problem List   Diagnosis Date Noted   Estrogen deficiency 05/02/2022   Mild intermittent asthma without complication 01/16/7627   Rib contusion, right, initial encounter 05/29/2019   Elevated HDL 05/31/2018   Panic attack as reaction to stress 05/31/2018   Lumbar post-laminectomy syndrome 01/24/2018   Gastroesophageal reflux disease 12/28/2017   Chronic neck pain 12/28/2017   Osteoarthritis 12/28/2017   Cervical radiculopathy 10/24/2017   Chronic pain 10/19/2017   DDD (degenerative disc disease), cervical 10/19/2017   Irritable bowel syndrome with diarrhea 09/29/2017   Migraine without status migrainosus, not intractable 09/29/2017   Family history of breast cancer- sister at age 29- new onset 02/23/2017   History of thyroid nodule- R side ( Dr Loanne Drilling eval in 10/17) 02/23/2017   Status post lumbar surgery- foot drop R foot 02/23/2017   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   Right thyroid nodule 05/28/2016   Intolerance to cold 05/02/2016   History of non anemic vitamin B12 deficiency 05/02/2016   Environmental and seasonal allergies 04/20/2016   h/o GAD (had panic in past) 02/12/2016   GERD (gastroesophageal reflux disease)    Osteoporosis    Chronic back pain    Idiopathic benign Leukopenia    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   HYPOKALEMIA 01/03/2008   Allergic rhinitis due to pollen 12/13/2007   VOCAL CORD DISORDER 06/09/2007  LACTOSE INTOLERANCE 03/07/2007   Ocular migraine 03/07/2007   CARPAL TUNNEL SYNDROME 03/07/2007   VARICOSE VEIN 03/07/2007   NEPHROLITHIASIS, HX OF 03/07/2007   Social History   Tobacco Use   Smoking status: Never   Smokeless tobacco: Never  Substance Use Topics   Alcohol use: No   Allergies  Allergen Reactions   Peanut Oil Anaphylaxis   Singulair [Montelukast]     Facial swelling   Aspartame And  Phenylalanine    Clarithromycin     REACTION: GI   Codeine Other (See Comments)    REACTION: nausea and vomiting   Lactose     Other reaction(s): GI Upset (intolerance)   Meloxicam Other (See Comments)    Face swelling   Other     GLUTEN RESTRICTED LACTOSE INTOLERANT ALLERGIC TO NUTS AND CORN   Penicillins     REACTION: per allergy testing/Patient states she has taken Augmentin without complications.    Pregabalin     REACTION: headache, several side effects   Sulfa Antibiotics Other (See Comments)    REACTION: Hives, wheezing   Sulfonamide Derivatives     REACTION: Hives, wheezing   Cefdinir Rash   Glycerin Other (See Comments)    Migraines, eye problems   Current Meds  Medication Sig   albuterol (VENTOLIN HFA) 108 (90 Base) MCG/ACT inhaler Inhale 2 puffs into the lungs every 6 (six) hours as needed for wheezing or shortness of breath.   ARNUITY ELLIPTA 100 MCG/ACT AEPB USE 1 INHALATION DAILY   Cholecalciferol (VITAMIN D-3) 125 MCG (5000 UT) TABS Take 1 tablet by mouth daily.   famotidine (PEPCID) 20 MG tablet Take 1 tablet (20 mg total) by mouth 2 (two) times daily.   loratadine (CLARITIN) 10 MG tablet Take 10 mg by mouth daily.   meclizine (ANTIVERT) 12.5 MG tablet SMARTSIG:1 Tablet(s) By Mouth Every 12 Hours PRN   omeprazole (PRILOSEC) 20 MG capsule Take 1 capsule (20 mg total) by mouth daily.   triamcinolone (NASACORT) 55 MCG/ACT AERO nasal inhaler Place 2 sprays into the nose daily.   TURMERIC PO Take 2,000 Units by mouth daily at 8 pm.   vitamin B-12 (CYANOCOBALAMIN) 1000 MCG tablet Take 1,000 mcg by mouth every other day.   vitamin E 400 UNIT capsule Take 800 Units by mouth daily.    Immunization History  Administered Date(s) Administered   DT (Pediatric) 08/17/2010   DTaP 08/17/2010   Fluad Quad(high Dose 65+) 04/25/2019   Influenza Whole 04/30/2004, 04/26/2007   Influenza, High Dose Seasonal PF 05/13/2016, 05/10/2017, 04/25/2018, 04/15/2020   Influenza,  Quadrivalent, Recombinant, Inj, Pf 05/05/2021   Influenza,inj,quad, With Preservative 04/25/2017   Influenza-Unspecified 06/07/2011, 04/24/2012, 05/26/2022   PFIZER(Purple Top)SARS-COV-2 Vaccination 08/13/2019, 09/06/2019, 05/16/2020   Pneumococcal Conjugate-13 04/20/2016   Pneumococcal Polysaccharide-23 04/30/2004, 07/29/2008, 07/27/2010, 04/20/2016   Td 07/26/2002, 11/19/2003   Tdap 08/17/2010, 04/20/2016   Zoster Recombinat (Shingrix) 04/25/2018, 09/11/2018   Zoster, Live 07/27/2007       Objective:   Physical Exam BP 110/70 (BP Location: Left Arm, Cuff Size: Normal)   Pulse 71   Temp 98.5 F (36.9 C)   Ht 5' 6.5" (1.689 m)   Wt 142 lb 9.6 oz (64.7 kg)   SpO2 98%   BMI 22.67 kg/m  GENERAL: Well-developed well-nourished woman in no acute distress.Fully ambulatory.No conversational dyspnea. HEAD: Normocephalic, atraumatic. EYES: Pupils equal, round, reactive to light.  No scleral icterus. MOUTH: Oral mucosa moist.  No thrush. NECK: Supple. No thyromegaly. Trachea midline. No JVD.  No  adenopathy. PULMONARY: Good air entry bilaterally.  No adventitious sounds. CARDIOVASCULAR: S1 and S2. Regular rate and rhythm.  No rubs, murmurs or gallops heard. ABDOMEN: Benign. MUSCULOSKELETAL: No joint deformity, no clubbing, no edema. NEUROLOGIC: No focal deficits, no gait disturbance, speech is fluent. SKIN: Intact,warm,dry.  Limited exam: No rashes. PSYCH: Mood and behavior normal   Lab Results  Component Value Date   NITRICOXIDE 7 06/30/2022      Assessment & Plan:     ICD-10-CM   1. Mild intermittent asthma without complication  J17.91 Nitric oxide   Patient is well compensated No evidence of acute type II inflammation in the airway Continue current regimen: Arnuity + as needed albuterol    2. Laryngopharyngeal reflux (LPR)  K21.9    Well-controlled with antireflux measures On Pepcid    3. Perennial allergic rhinitis with seasonal variation  J30.89    J30.2    Controlled  with nasal hygiene Claritin as needed Follows with ENT     Orders Placed This Encounter  Procedures   Nitric oxide   Meds ordered this encounter  Medications   Fluticasone Furoate (ARNUITY ELLIPTA) 100 MCG/ACT AEPB    Sig: USE 1 INHALATION DAILY    Dispense:  90 each    Refill:  3   Patient is well compensated on current regimen.  Will see her in follow-up in 3 months time she is to contact us prior to that time should any new difficulties arise.  Renold Don, MD [facility]  [facility]    *This note was dictated using voice recognition software/Dragon.  Despite best efforts to proofread, errors can occur which can change the meaning. Any transcriptional errors that result from this process are unintentional and may not be fully corrected at the time of dictation.

## 2022-07-22 ENCOUNTER — Ambulatory Visit: Payer: Medicare Other | Admitting: Nurse Practitioner

## 2022-07-22 ENCOUNTER — Other Ambulatory Visit: Payer: Medicare Other

## 2022-07-22 DIAGNOSIS — R946 Abnormal results of thyroid function studies: Secondary | ICD-10-CM | POA: Diagnosis not present

## 2022-07-22 DIAGNOSIS — K219 Gastro-esophageal reflux disease without esophagitis: Secondary | ICD-10-CM | POA: Diagnosis not present

## 2022-07-22 DIAGNOSIS — E559 Vitamin D deficiency, unspecified: Secondary | ICD-10-CM | POA: Diagnosis not present

## 2022-07-22 DIAGNOSIS — Z79899 Other long term (current) drug therapy: Secondary | ICD-10-CM | POA: Diagnosis not present

## 2022-07-22 DIAGNOSIS — M8589 Other specified disorders of bone density and structure, multiple sites: Secondary | ICD-10-CM | POA: Diagnosis not present

## 2022-07-22 DIAGNOSIS — R7309 Other abnormal glucose: Secondary | ICD-10-CM | POA: Diagnosis not present

## 2022-07-22 DIAGNOSIS — E7800 Pure hypercholesterolemia, unspecified: Secondary | ICD-10-CM | POA: Diagnosis not present

## 2022-07-22 DIAGNOSIS — E538 Deficiency of other specified B group vitamins: Secondary | ICD-10-CM | POA: Diagnosis not present

## 2022-07-29 DIAGNOSIS — J4520 Mild intermittent asthma, uncomplicated: Secondary | ICD-10-CM | POA: Diagnosis not present

## 2022-07-29 DIAGNOSIS — Z9109 Other allergy status, other than to drugs and biological substances: Secondary | ICD-10-CM | POA: Diagnosis not present

## 2022-07-29 DIAGNOSIS — Z79899 Other long term (current) drug therapy: Secondary | ICD-10-CM | POA: Diagnosis not present

## 2022-07-29 DIAGNOSIS — Z0000 Encounter for general adult medical examination without abnormal findings: Secondary | ICD-10-CM | POA: Diagnosis not present

## 2022-07-29 DIAGNOSIS — E559 Vitamin D deficiency, unspecified: Secondary | ICD-10-CM | POA: Diagnosis not present

## 2022-07-29 DIAGNOSIS — M5136 Other intervertebral disc degeneration, lumbar region: Secondary | ICD-10-CM | POA: Diagnosis not present

## 2022-07-29 DIAGNOSIS — M5030 Other cervical disc degeneration, unspecified cervical region: Secondary | ICD-10-CM | POA: Diagnosis not present

## 2022-07-29 DIAGNOSIS — K219 Gastro-esophageal reflux disease without esophagitis: Secondary | ICD-10-CM | POA: Diagnosis not present

## 2022-07-29 DIAGNOSIS — G43909 Migraine, unspecified, not intractable, without status migrainosus: Secondary | ICD-10-CM | POA: Diagnosis not present

## 2022-08-02 DIAGNOSIS — G8929 Other chronic pain: Secondary | ICD-10-CM | POA: Diagnosis not present

## 2022-08-02 DIAGNOSIS — M5030 Other cervical disc degeneration, unspecified cervical region: Secondary | ICD-10-CM | POA: Diagnosis not present

## 2022-08-02 DIAGNOSIS — M5412 Radiculopathy, cervical region: Secondary | ICD-10-CM | POA: Diagnosis not present

## 2022-08-02 DIAGNOSIS — M5459 Other low back pain: Secondary | ICD-10-CM | POA: Diagnosis not present

## 2022-08-02 DIAGNOSIS — M5416 Radiculopathy, lumbar region: Secondary | ICD-10-CM | POA: Diagnosis not present

## 2022-08-02 DIAGNOSIS — M961 Postlaminectomy syndrome, not elsewhere classified: Secondary | ICD-10-CM | POA: Diagnosis not present

## 2022-08-02 DIAGNOSIS — M5136 Other intervertebral disc degeneration, lumbar region: Secondary | ICD-10-CM | POA: Diagnosis not present

## 2022-08-07 ENCOUNTER — Inpatient Hospital Stay (HOSPITAL_COMMUNITY): Payer: Medicare Other

## 2022-08-07 ENCOUNTER — Other Ambulatory Visit: Payer: Self-pay

## 2022-08-07 ENCOUNTER — Inpatient Hospital Stay (HOSPITAL_COMMUNITY)
Admission: EM | Admit: 2022-08-07 | Discharge: 2022-08-11 | DRG: 482 | Disposition: A | Discharge status: Home Health | Payer: Medicare Other | Attending: Internal Medicine | Admitting: Internal Medicine

## 2022-08-07 ENCOUNTER — Emergency Department (HOSPITAL_COMMUNITY): Payer: Medicare Other

## 2022-08-07 DIAGNOSIS — S72001A Fracture of unspecified part of neck of right femur, initial encounter for closed fracture: Secondary | ICD-10-CM | POA: Diagnosis not present

## 2022-08-07 DIAGNOSIS — M79604 Pain in right leg: Secondary | ICD-10-CM | POA: Diagnosis not present

## 2022-08-07 DIAGNOSIS — S72121A Displaced fracture of lesser trochanter of right femur, initial encounter for closed fracture: Secondary | ICD-10-CM | POA: Diagnosis not present

## 2022-08-07 DIAGNOSIS — Z825 Family history of asthma and other chronic lower respiratory diseases: Secondary | ICD-10-CM

## 2022-08-07 DIAGNOSIS — Z83438 Family history of other disorder of lipoprotein metabolism and other lipidemia: Secondary | ICD-10-CM | POA: Diagnosis not present

## 2022-08-07 DIAGNOSIS — M797 Fibromyalgia: Secondary | ICD-10-CM | POA: Diagnosis present

## 2022-08-07 DIAGNOSIS — Z83719 Family history of colon polyps, unspecified: Secondary | ICD-10-CM | POA: Diagnosis not present

## 2022-08-07 DIAGNOSIS — Z811 Family history of alcohol abuse and dependence: Secondary | ICD-10-CM | POA: Diagnosis not present

## 2022-08-07 DIAGNOSIS — J4520 Mild intermittent asthma, uncomplicated: Secondary | ICD-10-CM | POA: Diagnosis present

## 2022-08-07 DIAGNOSIS — Z803 Family history of malignant neoplasm of breast: Secondary | ICD-10-CM

## 2022-08-07 DIAGNOSIS — J3089 Other allergic rhinitis: Secondary | ICD-10-CM | POA: Diagnosis present

## 2022-08-07 DIAGNOSIS — Y92009 Unspecified place in unspecified non-institutional (private) residence as the place of occurrence of the external cause: Secondary | ICD-10-CM

## 2022-08-07 DIAGNOSIS — Z90710 Acquired absence of both cervix and uterus: Secondary | ICD-10-CM

## 2022-08-07 DIAGNOSIS — I10 Essential (primary) hypertension: Secondary | ICD-10-CM | POA: Diagnosis not present

## 2022-08-07 DIAGNOSIS — M21371 Foot drop, right foot: Secondary | ICD-10-CM | POA: Diagnosis present

## 2022-08-07 DIAGNOSIS — I951 Orthostatic hypotension: Secondary | ICD-10-CM | POA: Diagnosis present

## 2022-08-07 DIAGNOSIS — K219 Gastro-esophageal reflux disease without esophagitis: Secondary | ICD-10-CM | POA: Diagnosis present

## 2022-08-07 DIAGNOSIS — M62838 Other muscle spasm: Secondary | ICD-10-CM | POA: Diagnosis not present

## 2022-08-07 DIAGNOSIS — R9431 Abnormal electrocardiogram [ECG] [EKG]: Secondary | ICD-10-CM | POA: Diagnosis not present

## 2022-08-07 DIAGNOSIS — I959 Hypotension, unspecified: Secondary | ICD-10-CM | POA: Diagnosis not present

## 2022-08-07 DIAGNOSIS — Z8042 Family history of malignant neoplasm of prostate: Secondary | ICD-10-CM

## 2022-08-07 DIAGNOSIS — R079 Chest pain, unspecified: Secondary | ICD-10-CM | POA: Diagnosis not present

## 2022-08-07 DIAGNOSIS — F419 Anxiety disorder, unspecified: Secondary | ICD-10-CM | POA: Diagnosis present

## 2022-08-07 DIAGNOSIS — W010XXA Fall on same level from slipping, tripping and stumbling without subsequent striking against object, initial encounter: Secondary | ICD-10-CM | POA: Diagnosis present

## 2022-08-07 DIAGNOSIS — M25551 Pain in right hip: Secondary | ICD-10-CM | POA: Diagnosis not present

## 2022-08-07 DIAGNOSIS — M1990 Unspecified osteoarthritis, unspecified site: Secondary | ICD-10-CM | POA: Diagnosis present

## 2022-08-07 DIAGNOSIS — S72141A Displaced intertrochanteric fracture of right femur, initial encounter for closed fracture: Principal | ICD-10-CM | POA: Diagnosis present

## 2022-08-07 DIAGNOSIS — E876 Hypokalemia: Secondary | ICD-10-CM | POA: Diagnosis present

## 2022-08-07 DIAGNOSIS — Z8249 Family history of ischemic heart disease and other diseases of the circulatory system: Secondary | ICD-10-CM

## 2022-08-07 DIAGNOSIS — M810 Age-related osteoporosis without current pathological fracture: Secondary | ICD-10-CM | POA: Diagnosis not present

## 2022-08-07 DIAGNOSIS — Z806 Family history of leukemia: Secondary | ICD-10-CM | POA: Diagnosis not present

## 2022-08-07 DIAGNOSIS — R110 Nausea: Secondary | ICD-10-CM | POA: Diagnosis not present

## 2022-08-07 DIAGNOSIS — Z882 Allergy status to sulfonamides status: Secondary | ICD-10-CM | POA: Diagnosis not present

## 2022-08-07 DIAGNOSIS — Z888 Allergy status to other drugs, medicaments and biological substances status: Secondary | ICD-10-CM

## 2022-08-07 DIAGNOSIS — G8929 Other chronic pain: Secondary | ICD-10-CM | POA: Diagnosis present

## 2022-08-07 DIAGNOSIS — Z01818 Encounter for other preprocedural examination: Secondary | ICD-10-CM | POA: Diagnosis not present

## 2022-08-07 DIAGNOSIS — Z8261 Family history of arthritis: Secondary | ICD-10-CM

## 2022-08-07 DIAGNOSIS — S8991XA Unspecified injury of right lower leg, initial encounter: Secondary | ICD-10-CM | POA: Diagnosis not present

## 2022-08-07 DIAGNOSIS — Z880 Allergy status to penicillin: Secondary | ICD-10-CM | POA: Diagnosis not present

## 2022-08-07 DIAGNOSIS — S72111A Displaced fracture of greater trochanter of right femur, initial encounter for closed fracture: Secondary | ICD-10-CM | POA: Diagnosis not present

## 2022-08-07 DIAGNOSIS — Z87442 Personal history of urinary calculi: Secondary | ICD-10-CM | POA: Diagnosis not present

## 2022-08-07 DIAGNOSIS — W19XXXA Unspecified fall, initial encounter: Secondary | ICD-10-CM | POA: Diagnosis not present

## 2022-08-07 LAB — CBC WITH DIFFERENTIAL/PLATELET
Abs Immature Granulocytes: 0.07 10*3/uL (ref 0.00–0.07)
Basophils Absolute: 0 10*3/uL (ref 0.0–0.1)
Basophils Relative: 0 %
Eosinophils Absolute: 0 10*3/uL (ref 0.0–0.5)
Eosinophils Relative: 0 %
HCT: 41.2 % (ref 36.0–46.0)
Hemoglobin: 13.7 g/dL (ref 12.0–15.0)
Immature Granulocytes: 1 %
Lymphocytes Relative: 16 %
Lymphs Abs: 1.1 10*3/uL (ref 0.7–4.0)
MCH: 30.5 pg (ref 26.0–34.0)
MCHC: 33.3 g/dL (ref 30.0–36.0)
MCV: 91.8 fL (ref 80.0–100.0)
Monocytes Absolute: 0.4 10*3/uL (ref 0.1–1.0)
Monocytes Relative: 6 %
Neutro Abs: 5.4 10*3/uL (ref 1.7–7.7)
Neutrophils Relative %: 77 %
Platelets: 155 10*3/uL (ref 150–400)
RBC: 4.49 MIL/uL (ref 3.87–5.11)
RDW: 16.1 % — ABNORMAL HIGH (ref 11.5–15.5)
WBC: 7.1 10*3/uL (ref 4.0–10.5)
nRBC: 0 % (ref 0.0–0.2)

## 2022-08-07 LAB — PROTIME-INR
INR: 1 (ref 0.8–1.2)
Prothrombin Time: 13.2 seconds (ref 11.4–15.2)

## 2022-08-07 LAB — BASIC METABOLIC PANEL
Anion gap: 10 (ref 5–15)
BUN: 20 mg/dL (ref 8–23)
CO2: 20 mmol/L — ABNORMAL LOW (ref 22–32)
Calcium: 8.8 mg/dL — ABNORMAL LOW (ref 8.9–10.3)
Chloride: 108 mmol/L (ref 98–111)
Creatinine, Ser: 0.73 mg/dL (ref 0.44–1.00)
GFR, Estimated: >60 mL/min (ref >60)
Glucose, Bld: 100 mg/dL — ABNORMAL HIGH (ref 70–99)
Potassium: 3.5 mmol/L (ref 3.5–5.1)
Sodium: 138 mmol/L (ref 135–145)

## 2022-08-07 LAB — SURGICAL PCR SCREEN
MRSA, PCR: NEGATIVE
Staphylococcus aureus: NEGATIVE

## 2022-08-07 LAB — TYPE AND SCREEN
ABO/RH(D): O POS
Antibody Screen: NEGATIVE

## 2022-08-07 LAB — ABO/RH: ABO/RH(D): O POS

## 2022-08-07 MED ORDER — ACETAMINOPHEN 650 MG RE SUPP: 650.0000 mg | Freq: Four times a day (QID) | RECTAL | Status: DC | PRN

## 2022-08-07 MED ORDER — ACETAMINOPHEN 325 MG PO TABS: 650.0000 mg | ORAL_TABLET | Freq: Four times a day (QID) | ORAL | Status: DC | PRN

## 2022-08-07 MED ORDER — LORATADINE 10 MG PO TABS
10.0000 mg | ORAL_TABLET | Freq: Every day | ORAL | Status: DC
  Administered 2022-08-09 – 2022-08-11 (×3): 10 mg via ORAL
  Filled 2022-08-07 (×3): qty 1

## 2022-08-07 MED ORDER — PANTOPRAZOLE SODIUM 40 MG PO TBEC
40.0000 mg | DELAYED_RELEASE_TABLET | Freq: Every day | ORAL | Status: DC
  Administered 2022-08-09 – 2022-08-11 (×3): 40 mg via ORAL
  Filled 2022-08-07 (×3): qty 1

## 2022-08-07 MED ORDER — PROCHLORPERAZINE EDISYLATE 10 MG/2ML IJ SOLN: 5.0000 mg | INTRAMUSCULAR | Status: DC | PRN

## 2022-08-07 MED ORDER — ACETAMINOPHEN 500 MG PO TABS: 1000.0000 mg | ORAL_TABLET | Freq: Once | ORAL | Status: DC

## 2022-08-07 MED ORDER — TRANEXAMIC ACID-NACL 1000-0.7 MG/100ML-% IV SOLN
1000.0000 mg | INTRAVENOUS | Status: AC
  Administered 2022-08-08: 1000 mg via INTRAVENOUS

## 2022-08-07 MED ORDER — CHLORHEXIDINE GLUCONATE 4 % EX LIQD
60.0000 mL | Freq: Once | CUTANEOUS | Status: AC
  Administered 2022-08-08: 4 via TOPICAL

## 2022-08-07 MED ORDER — SODIUM CHLORIDE 0.9 % IV SOLN: INTRAVENOUS | Status: DC

## 2022-08-07 MED ORDER — ISOPROPYL ALCOHOL 70 % SOLN
Status: AC
  Filled 2022-08-07: qty 480

## 2022-08-07 MED ORDER — POVIDONE-IODINE 10 % EX SWAB: 2.0000 | Freq: Once | CUTANEOUS | Status: DC

## 2022-08-07 MED ORDER — TRIAMCINOLONE ACETONIDE 55 MCG/ACT NA AERO: 2.0000 | INHALATION_SPRAY | Freq: Every day | NASAL | Status: DC | PRN

## 2022-08-07 MED ORDER — ALBUTEROL SULFATE (2.5 MG/3ML) 0.083% IN NEBU: 2.5000 mg | INHALATION_SOLUTION | Freq: Four times a day (QID) | RESPIRATORY_TRACT | Status: DC | PRN

## 2022-08-07 MED ORDER — ALBUTEROL SULFATE HFA 108 (90 BASE) MCG/ACT IN AERS: 2.0000 | INHALATION_SPRAY | Freq: Four times a day (QID) | RESPIRATORY_TRACT | Status: DC | PRN

## 2022-08-07 MED ORDER — HYDROMORPHONE HCL 1 MG/ML IJ SOLN
0.5000 mg | INTRAMUSCULAR | Status: DC | PRN
  Administered 2022-08-07 – 2022-08-08 (×4): 0.5 mg via INTRAVENOUS
  Filled 2022-08-07 (×5): qty 0.5

## 2022-08-07 MED ORDER — OXYCODONE HCL 5 MG PO TABS: 5.0000 mg | ORAL_TABLET | Freq: Four times a day (QID) | ORAL | Status: DC | PRN

## 2022-08-07 MED ORDER — CEFAZOLIN SODIUM-DEXTROSE 2-4 GM/100ML-% IV SOLN
2.0000 g | INTRAVENOUS | Status: AC
  Administered 2022-08-08: 2 g via INTRAVENOUS

## 2022-08-07 MED ORDER — FENTANYL CITRATE PF 50 MCG/ML IJ SOSY
50.0000 ug | PREFILLED_SYRINGE | INTRAMUSCULAR | Status: AC | PRN
  Administered 2022-08-07 (×2): 50 ug via INTRAVENOUS
  Filled 2022-08-07 (×2): qty 1

## 2022-08-07 NOTE — Plan of Care (Signed)
  Problem: Education: Goal: Knowledge of General Education information will improve Description: Including pain rating scale, medication(s)/side effects and non-pharmacologic comfort measures Outcome: Progressing   Problem: Pain Managment: Goal: General experience of comfort will improve Outcome: Progressing

## 2022-08-07 NOTE — ED Provider Notes (Signed)
[facility] DEPT Provider Note   CSN: [id number] Arrival date & time: 08/07/22  1116     History  Chief Complaint  Patient presents with   Fall    Right pelvic, thigh and knee pain     Leslie Dixon is a 77 y.o. female.  Patient is a healthy 77 year old female with a history of chronic leukopenia, arthritis who is presenting today after a fall at home and severe right hip pain.  She was cleaning the dog blankets today and had an arm full of blankets going to the washing machine when she dropped a blanket and her foot got tangled and she fell landing directly on her right hip.  She denies hitting her head or loss of consciousness.  She reports the pain was severe and she was unable to get up or stand.  She has no pain anywhere else except for the right hip.  She was given fentanyl by EMS with some improvement of the pain.  She denies any prior history of orthopedic injuries requiring surgery.  She does not take anticoagulation.  The history is provided by the patient.  Fall       Home Medications Prior to Admission medications   Medication Sig Start Date End Date Taking? Authorizing Provider  albuterol (VENTOLIN HFA) 108 (90 Base) MCG/ACT inhaler Inhale 2 puffs into the lungs every 6 (six) hours as needed for wheezing or shortness of breath. 10/22/21   Tyler Pita, MD  Cholecalciferol (VITAMIN D-3) 125 MCG (5000 UT) TABS Take 1 tablet by mouth daily.    [provider]  famotidine (PEPCID) 20 MG tablet Take 1 tablet (20 mg total) by mouth 2 (two) times daily. 02/24/22   Thornton Park, MD  Fluticasone Furoate (ARNUITY ELLIPTA) 100 MCG/ACT AEPB USE 1 INHALATION DAILY 06/30/22   Tyler Pita, MD  loratadine (CLARITIN) 10 MG tablet Take 10 mg by mouth daily.    [provider]  meclizine (ANTIVERT) 12.5 MG tablet SMARTSIG:1 Tablet(s) By Mouth Every 12 Hours PRN 07/09/21   [provider]  omeprazole (PRILOSEC) 20 MG  capsule Take 1 capsule (20 mg total) by mouth daily. 03/03/22   Thornton Park, MD  triamcinolone (NASACORT) 55 MCG/ACT AERO nasal inhaler Place 2 sprays into the nose daily.    [provider]  TURMERIC PO Take 2,000 Units by mouth daily at 8 pm.    [provider]  vitamin B-12 (CYANOCOBALAMIN) 1000 MCG tablet Take 1,000 mcg by mouth every other day.    [provider]  vitamin E 400 UNIT capsule Take 800 Units by mouth daily.     [provider]      Allergies    Peanut oil, Singulair [montelukast], Aspartame and phenylalanine, Clarithromycin, Codeine, Lactose, Meloxicam, Other, Penicillins, Pregabalin, Sulfa antibiotics, Sulfonamide derivatives, Cefdinir, and Glycerin    Review of Systems   Review of Systems  Physical Exam Updated Vital Signs BP 134/61 (BP Location: Right Arm)   Pulse 65   Temp 98.1 F (36.7 C) (Oral)   Resp 18   Ht '5\' 6"'$  (1.676 m)   Wt 60.3 kg   SpO2 97%   BMI 21.47 kg/m  Physical Exam Vitals and nursing note reviewed.  Constitutional:      General: She is not in acute distress.    Appearance: She is well-developed.  HENT:     Head: Normocephalic and atraumatic.  Eyes:     Pupils: Pupils are equal, round, and  reactive to light.  Cardiovascular:     Rate and Rhythm: Normal rate and regular rhythm.     Pulses: Normal pulses.     Heart sounds: Normal heart sounds. No murmur heard.    No friction rub.  Pulmonary:     Effort: Pulmonary effort is normal.     Breath sounds: Normal breath sounds. No wheezing or rales.  Abdominal:     General: Bowel sounds are normal. There is no distension.     Palpations: Abdomen is soft.     Tenderness: There is no abdominal tenderness. There is no guarding or rebound.  Musculoskeletal:        General: Tenderness present.     Right hip: Deformity, tenderness and bony tenderness present. Decreased range of motion.     Comments: No edema  Skin:    General: Skin is warm and dry.      Findings: No rash.  Neurological:     Mental Status: She is alert and oriented to person, place, and time.     Cranial Nerves: No cranial nerve deficit.  Psychiatric:        Behavior: Behavior normal.     ED Results / Procedures / Treatments   Labs (all labs ordered are listed, but only abnormal results are displayed) Labs Reviewed  BASIC METABOLIC PANEL - Abnormal; Notable for the following components:      Result Value   CO2 20 (*)    Glucose, Bld 100 (*)    Calcium 8.8 (*)    All other components within normal limits  CBC WITH DIFFERENTIAL/PLATELET - Abnormal; Notable for the following components:   RDW 16.1 (*)    All other components within normal limits  PROTIME-INR  TYPE AND SCREEN  ABO/RH    EKG None  Radiology DG Hip Unilat With Pelvis 2-3 Views Right  Result Date: 08/07/2022 CLINICAL DATA:  Pain after fall.  Injured right side. EXAM: DG HIP (WITH OR WITHOUT PELVIS) 2-3V RIGHT COMPARISON:  None Available. FINDINGS: Displaced intertrochanteric right proximal femur fracture with involvement of the greater and lesser trochanters. There is mild associated angulation. Femoral head remains seated. Bony pelvis and pubic rami are intact. Overlying artifact. IMPRESSION: Displaced intertrochanteric right proximal femur fracture. Electronically Signed   By: Keith Rake M.D.   On: 08/07/2022 13:05   DG Chest 1 View  Result Date: 08/07/2022 CLINICAL DATA:  Pain after fall.  Injured right side. EXAM: CHEST  1 VIEW COMPARISON:  Radiograph 07/27/2018 FINDINGS: Heart is normal in size for technique. Normal mediastinal contours. Mild chronic bronchitic change without acute airspace disease. Chronic biapical pleuroparenchymal scarring. No pneumothorax or large pleural effusion. On limited assessment, no acute osseous finding. IMPRESSION: 1. No acute findings or evidence of traumatic injury. 2. Mild chronic bronchitic change. Electronically Signed   By: Keith Rake M.D.   On:  08/07/2022 13:04    Procedures Procedures    Medications Ordered in ED Medications  0.9 %  sodium chloride infusion ( Intravenous New Bag/Given 08/07/22 1154)  HYDROmorphone (DILAUDID) injection 0.5 mg (has no administration in time range)  oxyCODONE (Oxy IR/ROXICODONE) immediate release tablet 5 mg (has no administration in time range)  acetaminophen (TYLENOL) tablet 650 mg (has no administration in time range)    Or  acetaminophen (TYLENOL) suppository 650 mg (has no administration in time range)  prochlorperazine (COMPAZINE) injection 5 mg (has no administration in time range)  fentaNYL (SUBLIMAZE) injection 50 mcg (50 mcg Intravenous Given 08/07/22 1319)  ED Course/ Medical Decision Making/ A&P                             Medical Decision Making Amount and/or Complexity of Data Reviewed Independent Historian: EMS Labs: ordered. Decision-making details documented in ED Course. Radiology: ordered and independent interpretation performed. Decision-making details documented in ED Course. ECG/medicine tests: ordered and independent interpretation performed. Decision-making details documented in ED Course.  Risk Prescription drug management. Decision regarding hospitalization.   Pt  presenting today with a complaint that caries a high risk for morbidity and mortality.  Here with right hip pain after a fall with deformity and concern for hip fracture.  She is neurovascularly intact at this time and does not take any anticoagulation.  She has no abdominal pain or injury to other extremities.  She did not hit her head or lose consciousness.  Patient started on hip fracture protocol.  Given further pain control.  I independently patient's labs and EKG.  BMP without acute findings, CBC at baseline, INR is within normal limits.  EKG is a poor tracing with a lot of artifact but no acute findings.  I have independently visualized and interpreted pt's images today.  Hip films today with  intertrochanteric femur fracture on the right.  Chest x-ray within normal limits.  Findings discussed with the patient and her husband.  Consulted orthopedics Dr. Marcelino Scot who reports patient needs to be n.p.o. after midnight and surgery will be tomorrow.  Admitted to the hospitalist service with Dr. Olevia Bowens.          Final Clinical Impression(s) / ED Diagnoses Final diagnoses:  Displaced intertrochanteric fracture of right femur, initial encounter for closed fracture [facility])    Rx / DC Orders ED Discharge Orders     None         Blanchie Dessert, MD 08/07/22 1421

## 2022-08-07 NOTE — Progress Notes (Signed)
Consult received for displaced right intertrochanteric femur fracture [facility]. Ate PTA. Patient requires IM nail R femur. Plan for surgery tomorrow am. NPO after MN. Hold chemical DVT ppx. Full consult to follow.

## 2022-08-07 NOTE — H&P (Signed)
History and Physical    Patient: Leslie Dixon GBT:[id number] DOB: 1945/08/28 DOA: 08/07/2022 DOS: the patient was seen and examined on 08/07/2022 PCP: Janie Morning, DO  Patient coming from: Home  Chief Complaint:  Chief Complaint  Patient presents with   Fall    Right pelvic, thigh and knee pain    HPI: Leslie Dixon is a 77 y.o. female with medical history significant of seasonal allergies, osteoarthritis, asthma, chronic back pain, right sciatica, fibromyalgia right foot drop, GERD, horseshoe kidney, nephrolithiasis cyst, leukopenia, migraine headaches, osteoporosis, sinusitis, vertigo who had a mechanical fall at home injuring her right hip and pelvic area.  No prodromal symptoms.  No fever, chills or night sweats. No sore throat, rhinorrhea, dyspnea, wheezing or hemoptysis.  No chest pain, palpitations, diaphoresis, PND, orthopnea or pitting edema of the lower extremities.  No appetite changes, abdominal pain, diarrhea, constipation, melena or hematochezia.  No flank pain, dysuria, frequency or hematuria.  No polyuria, polydipsia, polyphagia or blurred vision.  ED course: Initial vital signs were temperature 98.1 F, pulse 67, respiration 18, BP 134/61 mmHg O2 sat 98% on room air.  The patient received fentanyl 50 mcg IVP x 1.  Lab work: CBC 0 white count 7.1, hemoglobin 13.7 g/dL platelets 155.  Normal PT and INR.  CMP showed a CO2 of 20 mmol/L.  Glucose 100 and calcium 8.8 mg/dL.  The rest of the BMP measurements were normal.  Imaging: Right hip x-ray showed displaced intertrochanteric right proximal femur fracture.  Portable 1 view chest radiograph with mild chronic bronchitic change, there are no acute findings or evidence of traumatic injury.   Review of Systems: As mentioned in the history of present illness. All other systems reviewed and are negative. Past Medical History:  Diagnosis Date   Allergy    Arthritis    Asthma    Blood dyscrasia    LEUKOPENIA- DR. CHISM - NO  TREATMENT BUT IS MONITORED   Cataract    Chronic back pain    Chronic back pain    Fibromyalgia    Foot drop, right    RELATED TO LUMBAR PROBLEMS   GERD (gastroesophageal reflux disease)    PRILOSEC PRN   History of kidney stones    Horseshoe kidney    BILATERAL   Internal hemorrhoids    Leukopenia    Migraine    OCCULAR MIGRAINES - AURA WITH EYES   Osteoporosis    Pain    LUMBAR PAIN - RECENT FALL BECAUSE LEGS GAVE OUT - PT HAS HAD LUMBAR INJECTION SINCE THE FALL THAT HAS HELPED BACK PAIN   Pain    CHRONIC RIGHT UPPER QUADRANT PAIN - RELATED TO GALLBLADDER PROBLEM   Sciatica of right side    Sinusitis    f/u with Dr. Janace Hoard.    Vertigo    Past Surgical History:  Procedure Laterality Date   ABDOMINAL HYSTERECTOMY  1983   APPENDECTOMY  1968   BILATERAL SALPINGOOPHORECTOMY  2000   BREAST EXCISIONAL BIOPSY Right 1979   CHOLECYSTECTOMY N/A 04/12/2014   Procedure: LAPAROSCOPIC CHOLECYSTECTOMY WITH INTRAOPERATIVE CHOLANGIOGRAM;  Surgeon: Kaylyn Lim, MD;  Location: [facility];  Service: General;  Laterality: N/A;   Lumbar/cervical surgeries     CERVICAL FUSION C7-C6 WITH BONE GRAFT; C6-C5 WITH PLATE AND 4 SCREWS;  3 LUMBAR SURGERIES BUT NO FUSION   NASAL SINUS SURGERY  2010   right knee     ARTHROSCOPY   TONSILLECTOMY     TYMPANOSTOMY TUBE PLACEMENT  07/2017  WISDOM TOOTH PULLED     Social History:  reports that she has never smoked. She has never used smokeless tobacco. She reports that she does not drink alcohol and does not use drugs.  Allergies  Allergen Reactions   Peanut Oil Anaphylaxis   Singulair [Montelukast]     Facial swelling   Aspartame And Phenylalanine    Clarithromycin     REACTION: GI   Codeine Other (See Comments)    REACTION: nausea and vomiting   Lactose     Other reaction(s): GI Upset (intolerance)   Meloxicam Other (See Comments)    Face swelling   Other     GLUTEN RESTRICTED LACTOSE INTOLERANT ALLERGIC TO NUTS AND CORN   Penicillins      REACTION: per allergy testing/Patient states she has taken Augmentin without complications.    Pregabalin     REACTION: headache, several side effects   Sulfa Antibiotics Other (See Comments)    REACTION: Hives, wheezing   Sulfonamide Derivatives     REACTION: Hives, wheezing   Cefdinir Rash   Glycerin Other (See Comments)    Migraines, eye problems    Family History  Problem Relation Age of Onset   Neurodegenerative disease Mother    Arthritis Mother    Hypertension Father    Hyperlipidemia Father    Alcohol abuse Father    Lung cancer Father    Breast cancer Sister 6   Asthma Sister    Migraines Sister    Colon polyps Brother    Asthma Brother    Prostate cancer Brother    Breast cancer Maternal Aunt 66   Leukemia Maternal Grandfather    Thyroid disease Daughter    Asthma Son    Colon cancer Neg Hx    Esophageal cancer Neg Hx    Rectal cancer Neg Hx    Stomach cancer Neg Hx     Prior to Admission medications   Medication Sig Start Date End Date Taking? Authorizing Provider  albuterol (VENTOLIN HFA) 108 (90 Base) MCG/ACT inhaler Inhale 2 puffs into the lungs every 6 (six) hours as needed for wheezing or shortness of breath. 10/22/21   Tyler Pita, MD  Cholecalciferol (VITAMIN D-3) 125 MCG (5000 UT) TABS Take 1 tablet by mouth daily.    [provider]  famotidine (PEPCID) 20 MG tablet Take 1 tablet (20 mg total) by mouth 2 (two) times daily. 02/24/22   Thornton Park, MD  Fluticasone Furoate (ARNUITY ELLIPTA) 100 MCG/ACT AEPB USE 1 INHALATION DAILY 06/30/22   Tyler Pita, MD  loratadine (CLARITIN) 10 MG tablet Take 10 mg by mouth daily.    [provider]  meclizine (ANTIVERT) 12.5 MG tablet SMARTSIG:1 Tablet(s) By Mouth Every 12 Hours PRN 07/09/21   [provider]  omeprazole (PRILOSEC) 20 MG capsule Take 1 capsule (20 mg total) by mouth daily. 03/03/22   Thornton Park, MD  triamcinolone (NASACORT) 55 MCG/ACT AERO nasal  inhaler Place 2 sprays into the nose daily.    [provider]  TURMERIC PO Take 2,000 Units by mouth daily at 8 pm.    [provider]  vitamin B-12 (CYANOCOBALAMIN) 1000 MCG tablet Take 1,000 mcg by mouth every other day.    [provider]  vitamin E 400 UNIT capsule Take 800 Units by mouth daily.     [provider]    Physical Exam: Vitals:   08/07/22 1128 08/07/22 1129 08/07/22 1133 08/07/22 1134  BP:   134/61  Pulse:   65   Resp:   18   Temp:  98.1 F (36.7 C) 98.1 F (36.7 C)   TempSrc:  Oral Oral   SpO2: 98%  97%   Weight:    60.3 kg  Height:    '5\' 6"'$  (1.676 m)   Physical Exam Vitals and nursing note reviewed.  Constitutional:      General: She is awake. She is not in acute distress. HENT:     Head: Normocephalic.     Nose: No rhinorrhea.  Eyes:     General: No scleral icterus.    Pupils: Pupils are equal, round, and reactive to light.  Neck:     Vascular: No JVD.  Cardiovascular:     Rate and Rhythm: Normal rate and regular rhythm.     Heart sounds: S1 normal and S2 normal.  Pulmonary:     Effort: Pulmonary effort is normal.     Breath sounds: Normal breath sounds. No wheezing, rhonchi or rales.  Abdominal:     General: Bowel sounds are normal. There is no distension.     Palpations: Abdomen is soft.     Tenderness: There is no abdominal tenderness.  Musculoskeletal:     Cervical back: Neck supple.     Right hip: Tenderness present. Decreased range of motion.     Right lower leg: No edema.     Left lower leg: No edema.  Skin:    General: Skin is warm and dry.  Neurological:     General: No focal deficit present.     Mental Status: She is alert and oriented to person, place, and time.  Psychiatric:        Mood and Affect: Mood normal.        Behavior: Behavior normal. Behavior is cooperative.    Data Reviewed:  Results are pending, will review when available.  Assessment and Plan: Principal Problem:   Closed  right hip fracture, initial encounter ([place]) Likely due to:   Osteoporosis Admit to telemetry/inpatient. Ice area as needed. Buck's traction per protocol. Analgesics as needed. Antiemetics as needed. Consult TOC team. Consult nutritional services. PT evaluation after surgery. Orthopedic surgery will evaluate. Surgery planned for tomorrow AM. Follow-up as OP with PCP for osteoporosis treatment.  Active Problems:   GERD (gastroesophageal reflux disease) Continue omeprazole or formulary equivalent.    Environmental and seasonal allergies Continue nasal steroids as needed. Continue daily loratadine.    Mild intermittent asthma without complication Bronchodilators and inhaled steroids as needed.    Advance Care Planning:   Code Status: Full Code   Consults: Orthopedic surgery.  Family Communication: Her husband was at bedside.  Severity of Illness: The appropriate patient status for this patient is INPATIENT. Inpatient status is judged to be reasonable and necessary in order to provide the required intensity of service to ensure the patient's safety. The patient's presenting symptoms, physical exam findings, and initial radiographic and laboratory data in the context of their chronic comorbidities is felt to place them at high risk for further clinical deterioration. Furthermore, it is not anticipated that the patient will be medically stable for discharge from the hospital within 2 midnights of admission.   * I certify that at the point of admission it is my clinical judgment that the patient will require inpatient hospital care spanning beyond 2 midnights from the point of admission due to high intensity of service, high risk for further deterioration and high frequency of surveillance required.*  Author: Shanon Brow  Judieth Keens, MD 08/07/2022 2:05 PM  For on call review [email or web address].   This document was prepared using Dragon voice recognition software and may contain some  unintended transcription errors.

## 2022-08-07 NOTE — ED Triage Notes (Addendum)
Patient brought in by EMS from home after a fall. Per EMS patient fell forward and injured her right side including pelvic area, thigh and leg. She has swelling to upper thigh but no bruising per EMS. She has a 20g in left hand and was given Fent 75mg and Zofran '4mg'$  for pain and nausea. She is not on any thinners. HX of DDD, asthma and L4-L5 surgery.  142/80 98 28 98% RA

## 2022-08-08 ENCOUNTER — Inpatient Hospital Stay (HOSPITAL_COMMUNITY): Payer: Medicare Other | Admitting: Anesthesiology

## 2022-08-08 ENCOUNTER — Encounter (HOSPITAL_COMMUNITY): Payer: Self-pay | Admitting: Internal Medicine

## 2022-08-08 ENCOUNTER — Encounter (HOSPITAL_COMMUNITY)
Admission: EM | Disposition: A | Discharge status: Home Health | Payer: Self-pay | Source: Home / Self Care | Attending: Internal Medicine

## 2022-08-08 ENCOUNTER — Inpatient Hospital Stay (HOSPITAL_COMMUNITY): Payer: Medicare Other

## 2022-08-08 DIAGNOSIS — S72141A Displaced intertrochanteric fracture of right femur, initial encounter for closed fracture: Secondary | ICD-10-CM

## 2022-08-08 DIAGNOSIS — S72001A Fracture of unspecified part of neck of right femur, initial encounter for closed fracture: Secondary | ICD-10-CM | POA: Diagnosis not present

## 2022-08-08 HISTORY — PX: INTRAMEDULLARY (IM) NAIL INTERTROCHANTERIC: SHX5875

## 2022-08-08 LAB — CBC
HCT: 37.8 % (ref 36.0–46.0)
Hemoglobin: 12.6 g/dL (ref 12.0–15.0)
MCH: 30.5 pg (ref 26.0–34.0)
MCHC: 33.3 g/dL (ref 30.0–36.0)
MCV: 91.5 fL (ref 80.0–100.0)
Platelets: 142 10*3/uL — ABNORMAL LOW (ref 150–400)
RBC: 4.13 MIL/uL (ref 3.87–5.11)
RDW: 16 % — ABNORMAL HIGH (ref 11.5–15.5)
WBC: 5.9 10*3/uL (ref 4.0–10.5)
nRBC: 0 % (ref 0.0–0.2)

## 2022-08-08 LAB — COMPREHENSIVE METABOLIC PANEL
ALT: 17 U/L (ref 0–44)
AST: 23 U/L (ref 15–41)
Albumin: 3.5 g/dL (ref 3.5–5.0)
Alkaline Phosphatase: 46 U/L (ref 38–126)
Anion gap: 6 (ref 5–15)
BUN: 17 mg/dL (ref 8–23)
CO2: 20 mmol/L — ABNORMAL LOW (ref 22–32)
Calcium: 7.8 mg/dL — ABNORMAL LOW (ref 8.9–10.3)
Chloride: 110 mmol/L (ref 98–111)
Creatinine, Ser: 0.6 mg/dL (ref 0.44–1.00)
GFR, Estimated: >60 mL/min (ref >60)
Glucose, Bld: 130 mg/dL — ABNORMAL HIGH (ref 70–99)
Potassium: 3.2 mmol/L — ABNORMAL LOW (ref 3.5–5.1)
Sodium: 136 mmol/L (ref 135–145)
Total Bilirubin: 0.7 mg/dL (ref 0.3–1.2)
Total Protein: 5.8 g/dL — ABNORMAL LOW (ref 6.5–8.1)

## 2022-08-08 SURGERY — FIXATION, FRACTURE, INTERTROCHANTERIC, WITH INTRAMEDULLARY ROD
Anesthesia: General | Site: Hip | Laterality: Right

## 2022-08-08 MED ORDER — SUGAMMADEX SODIUM 200 MG/2ML IV SOLN
INTRAVENOUS | Status: DC | PRN
  Administered 2022-08-08: 300 mg via INTRAVENOUS

## 2022-08-08 MED ORDER — METOCLOPRAMIDE HCL 5 MG PO TABS: 5.0000 mg | ORAL_TABLET | Freq: Three times a day (TID) | ORAL | Status: DC | PRN

## 2022-08-08 MED ORDER — ACETAMINOPHEN 10 MG/ML IV SOLN
INTRAVENOUS | Status: DC | PRN
  Administered 2022-08-08: 1000 mg via INTRAVENOUS

## 2022-08-08 MED ORDER — ACETAMINOPHEN 160 MG/5ML PO SOLN: 1000.0000 mg | Freq: Once | ORAL | Status: DC | PRN

## 2022-08-08 MED ORDER — PROPOFOL 10 MG/ML IV BOLUS
INTRAVENOUS | Status: AC
  Filled 2022-08-08: qty 20

## 2022-08-08 MED ORDER — MELATONIN 3 MG PO TABS
3.0000 mg | ORAL_TABLET | Freq: Every evening | ORAL | Status: DC | PRN
  Administered 2022-08-08: 3 mg via ORAL
  Filled 2022-08-08: qty 1

## 2022-08-08 MED ORDER — STERILE WATER FOR IRRIGATION IR SOLN
Status: DC | PRN
  Administered 2022-08-08: 2000 mL

## 2022-08-08 MED ORDER — MORPHINE SULFATE (PF) 2 MG/ML IV SOLN: 0.5000 mg | INTRAVENOUS | Status: DC | PRN

## 2022-08-08 MED ORDER — ONDANSETRON HCL 4 MG PO TABS: 4.0000 mg | ORAL_TABLET | Freq: Four times a day (QID) | ORAL | Status: DC | PRN

## 2022-08-08 MED ORDER — SENNA 8.6 MG PO TABS
1.0000 | ORAL_TABLET | Freq: Two times a day (BID) | ORAL | Status: DC
  Administered 2022-08-08 – 2022-08-10 (×3): 8.6 mg via ORAL
  Filled 2022-08-08 (×5): qty 1

## 2022-08-08 MED ORDER — FENTANYL CITRATE PF 50 MCG/ML IJ SOSY
PREFILLED_SYRINGE | INTRAMUSCULAR | Status: AC
  Filled 2022-08-08: qty 2

## 2022-08-08 MED ORDER — FENTANYL CITRATE PF 50 MCG/ML IJ SOSY
25.0000 ug | PREFILLED_SYRINGE | INTRAMUSCULAR | Status: DC | PRN
  Administered 2022-08-08: 50 ug via INTRAVENOUS

## 2022-08-08 MED ORDER — ACETAMINOPHEN 500 MG PO TABS
500.0000 mg | ORAL_TABLET | Freq: Four times a day (QID) | ORAL | Status: AC
  Administered 2022-08-08 – 2022-08-09 (×4): 500 mg via ORAL
  Filled 2022-08-08 (×4): qty 1

## 2022-08-08 MED ORDER — SODIUM CHLORIDE 0.9 % IR SOLN
Status: DC | PRN
  Administered 2022-08-08: 1000 mL

## 2022-08-08 MED ORDER — ACETAMINOPHEN 500 MG PO TABS: 1000.0000 mg | ORAL_TABLET | Freq: Once | ORAL | Status: DC | PRN

## 2022-08-08 MED ORDER — MIDAZOLAM HCL 2 MG/2ML IJ SOLN
INTRAMUSCULAR | Status: AC
  Filled 2022-08-08: qty 2

## 2022-08-08 MED ORDER — PHENOL 1.4 % MT LIQD: 1.0000 | OROMUCOSAL | Status: DC | PRN

## 2022-08-08 MED ORDER — MIDAZOLAM HCL 5 MG/5ML IJ SOLN
INTRAMUSCULAR | Status: DC | PRN
  Administered 2022-08-08: 2 mg via INTRAVENOUS

## 2022-08-08 MED ORDER — PROPOFOL 500 MG/50ML IV EMUL
INTRAVENOUS | Status: DC | PRN
  Administered 2022-08-08: 25 ug/kg/min via INTRAVENOUS

## 2022-08-08 MED ORDER — ASPIRIN 325 MG PO TBEC
325.0000 mg | DELAYED_RELEASE_TABLET | Freq: Every day | ORAL | Status: DC
  Administered 2022-08-09 – 2022-08-11 (×3): 325 mg via ORAL
  Filled 2022-08-08 (×3): qty 1

## 2022-08-08 MED ORDER — ONDANSETRON HCL 4 MG/2ML IJ SOLN: 4.0000 mg | Freq: Four times a day (QID) | INTRAMUSCULAR | Status: DC | PRN

## 2022-08-08 MED ORDER — ROCURONIUM BROMIDE 100 MG/10ML IV SOLN
INTRAVENOUS | Status: DC | PRN
  Administered 2022-08-08: 80 mg via INTRAVENOUS

## 2022-08-08 MED ORDER — FENTANYL CITRATE (PF) 100 MCG/2ML IJ SOLN
INTRAMUSCULAR | Status: AC
  Filled 2022-08-08: qty 2

## 2022-08-08 MED ORDER — ONDANSETRON HCL 4 MG/2ML IJ SOLN
INTRAMUSCULAR | Status: DC | PRN
  Administered 2022-08-08: 4 mg via INTRAVENOUS

## 2022-08-08 MED ORDER — LIDOCAINE 2% (20 MG/ML) 5 ML SYRINGE
INTRAMUSCULAR | Status: DC | PRN
  Administered 2022-08-08: 100 mg via INTRAVENOUS

## 2022-08-08 MED ORDER — METOCLOPRAMIDE HCL 5 MG/ML IJ SOLN: 5.0000 mg | Freq: Three times a day (TID) | INTRAMUSCULAR | Status: DC | PRN

## 2022-08-08 MED ORDER — MENTHOL 3 MG MT LOZG: 1.0000 | LOZENGE | OROMUCOSAL | Status: DC | PRN

## 2022-08-08 MED ORDER — METHOCARBAMOL 500 MG IVPB - SIMPLE MED
INTRAVENOUS | Status: AC
  Administered 2022-08-08: 500 mg
  Filled 2022-08-08: qty 55

## 2022-08-08 MED ORDER — CEFAZOLIN SODIUM-DEXTROSE 2-4 GM/100ML-% IV SOLN
INTRAVENOUS | Status: AC
  Filled 2022-08-08: qty 100

## 2022-08-08 MED ORDER — OXYCODONE HCL 5 MG PO TABS: 5.0000 mg | ORAL_TABLET | Freq: Once | ORAL | Status: DC | PRN

## 2022-08-08 MED ORDER — OXYCODONE HCL 5 MG/5ML PO SOLN: 5.0000 mg | Freq: Once | ORAL | Status: DC | PRN

## 2022-08-08 MED ORDER — ACETAMINOPHEN 10 MG/ML IV SOLN
INTRAVENOUS | Status: AC
  Filled 2022-08-08: qty 100

## 2022-08-08 MED ORDER — ISOPROPYL ALCOHOL 70 % SOLN
Status: DC | PRN
  Administered 2022-08-08: 1 via TOPICAL

## 2022-08-08 MED ORDER — PROPOFOL 1000 MG/100ML IV EMUL
INTRAVENOUS | Status: AC
  Filled 2022-08-08: qty 100

## 2022-08-08 MED ORDER — HYDROCODONE-ACETAMINOPHEN 5-325 MG PO TABS
1.0000 | ORAL_TABLET | ORAL | Status: DC | PRN
  Administered 2022-08-09 – 2022-08-11 (×5): 2 via ORAL
  Filled 2022-08-08 (×5): qty 2

## 2022-08-08 MED ORDER — ESMOLOL HCL 100 MG/10ML IV SOLN
INTRAVENOUS | Status: AC
  Filled 2022-08-08: qty 10

## 2022-08-08 MED ORDER — POTASSIUM CHLORIDE CRYS ER 20 MEQ PO TBCR
40.0000 meq | EXTENDED_RELEASE_TABLET | ORAL | Status: AC
  Administered 2022-08-08 (×2): 40 meq via ORAL
  Filled 2022-08-08 (×2): qty 2

## 2022-08-08 MED ORDER — FENTANYL CITRATE (PF) 100 MCG/2ML IJ SOLN
INTRAMUSCULAR | Status: DC | PRN
  Administered 2022-08-08 (×2): 50 ug via INTRAVENOUS

## 2022-08-08 MED ORDER — DEXAMETHASONE SODIUM PHOSPHATE 10 MG/ML IJ SOLN
INTRAMUSCULAR | Status: DC | PRN
  Administered 2022-08-08: 10 mg via INTRAVENOUS

## 2022-08-08 MED ORDER — HYDROCODONE-ACETAMINOPHEN 7.5-325 MG PO TABS: 1.0000 | ORAL_TABLET | ORAL | Status: DC | PRN

## 2022-08-08 MED ORDER — DOCUSATE SODIUM 100 MG PO CAPS
100.0000 mg | ORAL_CAPSULE | Freq: Two times a day (BID) | ORAL | Status: DC
  Administered 2022-08-08 – 2022-08-10 (×3): 100 mg via ORAL
  Filled 2022-08-08 (×5): qty 1

## 2022-08-08 MED ORDER — PHENYLEPHRINE 80 MCG/ML (10ML) SYRINGE FOR IV PUSH (FOR BLOOD PRESSURE SUPPORT)
PREFILLED_SYRINGE | INTRAVENOUS | Status: DC | PRN
  Administered 2022-08-08: 160 ug via INTRAVENOUS

## 2022-08-08 MED ORDER — ACETAMINOPHEN 10 MG/ML IV SOLN: 1000.0000 mg | Freq: Once | INTRAVENOUS | Status: DC | PRN

## 2022-08-08 MED ORDER — LIDOCAINE HCL (PF) 2 % IJ SOLN
INTRAMUSCULAR | Status: AC
  Filled 2022-08-08: qty 5

## 2022-08-08 MED ORDER — METHOCARBAMOL 500 MG PO TABS
500.0000 mg | ORAL_TABLET | Freq: Four times a day (QID) | ORAL | Status: DC | PRN
  Administered 2022-08-08 – 2022-08-11 (×5): 500 mg via ORAL
  Filled 2022-08-08 (×5): qty 1

## 2022-08-08 MED ORDER — ACETAMINOPHEN 325 MG PO TABS
325.0000 mg | ORAL_TABLET | Freq: Four times a day (QID) | ORAL | Status: DC | PRN
  Administered 2022-08-09: 650 mg via ORAL
  Filled 2022-08-08: qty 2

## 2022-08-08 MED ORDER — METHOCARBAMOL 500 MG IVPB - SIMPLE MED: 500.0000 mg | Freq: Four times a day (QID) | INTRAVENOUS | Status: DC | PRN

## 2022-08-08 MED ORDER — CEFAZOLIN SODIUM-DEXTROSE 2-4 GM/100ML-% IV SOLN
2.0000 g | Freq: Four times a day (QID) | INTRAVENOUS | Status: AC
  Administered 2022-08-08 (×2): 2 g via INTRAVENOUS
  Filled 2022-08-08 (×2): qty 100

## 2022-08-08 MED ORDER — POLYETHYLENE GLYCOL 3350 17 G PO PACK: 17.0000 g | PACK | Freq: Every day | ORAL | Status: DC | PRN

## 2022-08-08 MED ORDER — PROPOFOL 10 MG/ML IV BOLUS
INTRAVENOUS | Status: DC | PRN
  Administered 2022-08-08: 100 mg via INTRAVENOUS

## 2022-08-08 MED ORDER — TRANEXAMIC ACID-NACL 1000-0.7 MG/100ML-% IV SOLN
INTRAVENOUS | Status: AC
  Filled 2022-08-08: qty 100

## 2022-08-08 SURGICAL SUPPLY — 51 items
ADH SKN CLS APL DERMABOND .7 (GAUZE/BANDAGES/DRESSINGS) ×2
APL PRP STRL LF DISP 70% ISPRP (MISCELLANEOUS) ×1
BAG COUNTER SPONGE SURGICOUNT (BAG) IMPLANT
BAG SPEC THK2 15X12 ZIP CLS (MISCELLANEOUS)
BAG SPNG CNTER NS LX DISP (BAG)
BAG ZIPLOCK 12X15 (MISCELLANEOUS) IMPLANT
BIT DRILL CANN LG 4.3MM (BIT) IMPLANT
BIT DRILL LAG SCREW (DRILL) IMPLANT
CHLORAPREP W/TINT 26 (MISCELLANEOUS) ×2 IMPLANT
COVER PERINEAL POST (MISCELLANEOUS) ×2 IMPLANT
COVER SURGICAL LIGHT HANDLE (MISCELLANEOUS) ×2 IMPLANT
DERMABOND ADVANCED .7 DNX12 (GAUZE/BANDAGES/DRESSINGS) ×4 IMPLANT
DRAPE C-ARM 42X120 X-RAY (DRAPES) ×2 IMPLANT
DRAPE C-ARMOR (DRAPES) ×2 IMPLANT
DRAPE IMP U-DRAPE 54X76 (DRAPES) ×4 IMPLANT
DRAPE SHEET LG 3/4 BI-LAMINATE (DRAPES) ×4 IMPLANT
DRAPE STERI IOBAN 125X83 (DRAPES) ×2 IMPLANT
DRAPE U-SHAPE 47X51 STRL (DRAPES) ×4 IMPLANT
DRESSING MEPILEX FLEX 4X4 (GAUZE/BANDAGES/DRESSINGS) ×4 IMPLANT
DRILL BIT CANN LG 4.3MM (BIT) ×1
DRILL LAG SCREW (DRILL) ×1
DRSG AQUACEL AG ADV 3.5X 4 (GAUZE/BANDAGES/DRESSINGS) IMPLANT
DRSG AQUACEL AG ADV 3.5X 6 (GAUZE/BANDAGES/DRESSINGS) IMPLANT
DRSG MEPILEX FLEX 4X4 (GAUZE/BANDAGES/DRESSINGS) ×2
DRSG MEPILEX POST OP 4X8 (GAUZE/BANDAGES/DRESSINGS) IMPLANT
ELECT BLADE TIP CTD 4 INCH (ELECTRODE) IMPLANT
FACESHIELD WRAPAROUND (MASK) ×2 IMPLANT
FACESHIELD WRAPAROUND OR TEAM (MASK) ×4 IMPLANT
GAUZE SPONGE 4X4 12PLY STRL (GAUZE/BANDAGES/DRESSINGS) ×2 IMPLANT
GLOVE BIO SURGEON STRL SZ8.5 (GLOVE) ×4 IMPLANT
GLOVE BIOGEL M 7.0 STRL (GLOVE) ×2 IMPLANT
GLOVE BIOGEL PI IND STRL 7.5 (GLOVE) ×2 IMPLANT
GLOVE BIOGEL PI IND STRL 8 (GLOVE) ×2 IMPLANT
GLOVE BIOGEL PI IND STRL 8.5 (GLOVE) ×2 IMPLANT
GLOVE SURG LX STRL 7.5 STRW (GLOVE) ×4 IMPLANT
GOWN SPEC L3 XXLG W/TWL (GOWN DISPOSABLE) ×2 IMPLANT
GOWN STRL REUS W/ TWL LRG LVL3 (GOWN DISPOSABLE) IMPLANT
GOWN STRL REUS W/TWL LRG LVL3 (GOWN DISPOSABLE) ×2
GUIDEPIN VERSANAIL DSP 3.2X444 (ORTHOPEDIC DISPOSABLE SUPPLIES) IMPLANT
HFN 125 DEG 9MM X 180MM (Nail) IMPLANT
HIP FRAC NAIL LAG SCR 10.5X100 (Orthopedic Implant) ×1 IMPLANT
KIT BASIN OR (CUSTOM PROCEDURE TRAY) ×2 IMPLANT
MANIFOLD NEPTUNE II (INSTRUMENTS) ×2 IMPLANT
MARKER SKIN DUAL TIP RULER LAB (MISCELLANEOUS) ×2 IMPLANT
PACK GENERAL/GYN (CUSTOM PROCEDURE TRAY) ×2 IMPLANT
SCREW BONE CORTICAL 5.0X3 (Screw) IMPLANT
SCREW CANN THRD AFF 10.5X100 (Orthopedic Implant) IMPLANT
SUT MNCRL AB 3-0 PS2 18 (SUTURE) ×2 IMPLANT
SUT MON AB 2-0 CT1 36 (SUTURE) IMPLANT
SUT VIC AB 1 CT1 36 (SUTURE) ×2 IMPLANT
TOWEL OR 17X26 10 PK STRL BLUE (TOWEL DISPOSABLE) ×2 IMPLANT

## 2022-08-08 NOTE — Plan of Care (Signed)
  Problem: Education: Goal: Knowledge of General Education information will improve Description Including pain rating scale, medication(s)/side effects and non-pharmacologic comfort measures Outcome: Progressing   Problem: Clinical Measurements: Goal: Will remain free from infection Outcome: Progressing   Problem: Activity: Goal: Risk for activity intolerance will decrease Outcome: Progressing   

## 2022-08-08 NOTE — Progress Notes (Addendum)
PROGRESS NOTE    KESSIE CROSTON  HYI:[id number] DOB: [birth year] DOA: 08/07/2022 PCP: Janie Morning, DO  Outpatient Specialists:     Brief Narrative:  Patient is a 77 female with past medical history significant for stage disease, asthma, chronic back pain, sciatica, fibromyalgia, GERD, nephrolithiasis, osteoporosis and vertigo.  Patient was admitted with right hip fracture following a fall.  Patient has undergone intramedullary fixation of right femur for right intertrochanteric femur fracture.  Patient was seen alongside patient's husband.  Pain is controlled.  No new complaints.   Assessment & Plan:   Principal Problem:   Closed right hip fracture, initial encounter ([place]) Active Problems:   GERD (gastroesophageal reflux disease)   Osteoporosis   Environmental and seasonal allergies   Mild intermittent asthma without complication   Closed right hip fracture, initial encounter ([place]) -Following a mechanical fall. -Orthopedic input is appreciated. -Patient has undergone intramedullary fixation of right femur. -Postop management as per the surgical team.    Osteoporosis -Continue home medications on discharge.   Active Problems:   GERD (gastroesophageal reflux disease) Continue omeprazole or formulary equivalent.     Environmental and seasonal allergies Continue nasal steroids as needed. Continue daily loratadine.     Mild intermittent asthma without complication Bronchodilators and inhaled steroids as needed.  Hypokalemia:  K-Dur 40 mEq p.o. every 4 hours x 2 doses. -Renal panel and magnesium in the  DVT prophylaxis: Will defer to the surgical team. Code Status: Full code Family Communication: Husband Disposition Plan: This will depend on hospital course.   Consultants:  Cardiac surgery  Procedures:  Intramedullary fixation of right femur  Antimicrobials:  IV cefazolin (perioperative)   Subjective: Right hip pain is well-controlled.  Objective: Vitals:    08/08/22 1017 08/08/22 1021 08/08/22 1030 08/08/22 1031  BP: 118/89   (!) 126/53  Pulse: 81 63 66 78  Resp: (!) '22 13 12 17  '$ Temp:      TempSrc:      SpO2: 96% 97% 97% 100%  Weight:      Height:        Intake/Output Summary (Last 24 hours) at 08/08/2022 1034 Last data filed at 08/08/2022 1012 Gross per 24 hour  Intake 659.17 ml  Output 25 ml  Net 634.17 ml   Filed Weights   08/07/22 1134  Weight: 60.3 kg    Examination:  General exam: Appears calm and comfortable  Respiratory system: Clear to auscultation. Cardiovascular system: S1 & S2 heard Gastrointestinal system: Abdomen is soft and nontender.  Central nervous system: Alert and oriented. No focal neurological deficits. Extremities: No leg edema.   Data Reviewed: I have personally reviewed following labs and imaging studies  CBC: Recent Labs  Lab 08/07/22 1201 08/08/22 0323  WBC 7.1 5.9  NEUTROABS 5.4  --   HGB 13.7 12.6  HCT 41.2 37.8  MCV 91.8 91.5  PLT 155 786*   Basic Metabolic Panel: Recent Labs  Lab 08/07/22 1201 08/08/22 0323  NA 138 136  K 3.5 3.2*  CL 108 110  CO2 20* 20*  GLUCOSE 100* 130*  BUN 20 17  CREATININE 0.73 0.60  CALCIUM 8.8* 7.8*   GFR: Estimated Creatinine Clearance: 54.3 mL/min (by C-G formula based on SCr of 0.6 mg/dL). Liver Function Tests: Recent Labs  Lab 08/08/22 0323  AST 23  ALT 17  ALKPHOS 46  BILITOT 0.7  PROT 5.8*  ALBUMIN 3.5   No results for input(s): "LIPASE", "AMYLASE" in the last 168 hours. No results for  input(s): "AMMONIA" in the last 168 hours. Coagulation Profile: Recent Labs  Lab 08/07/22 1201  INR 1.0   Cardiac Enzymes: No results for input(s): "CKTOTAL", "CKMB", "CKMBINDEX", "TROPONINI" in the last 168 hours. BNP (last 3 results) No results for input(s): "PROBNP" in the last 8760 hours. HbA1C: No results for input(s): "HGBA1C" in the last 72 hours. CBG: No results for input(s): "GLUCAP" in the last 168 hours. Lipid Profile: No  results for input(s): "CHOL", "HDL", "LDLCALC", "TRIG", "CHOLHDL", "LDLDIRECT" in the last 72 hours. Thyroid Function Tests: No results for input(s): "TSH", "T4TOTAL", "FREET4", "T3FREE", "THYROIDAB" in the last 72 hours. Anemia Panel: No results for input(s): "VITAMINB12", "FOLATE", "FERRITIN", "TIBC", "IRON", "RETICCTPCT" in the last 72 hours. Urine analysis: No results found for: "COLORURINE", "APPEARANCEUR", "LABSPEC", "PHURINE", "GLUCOSEU", "HGBUR", "BILIRUBINUR", "KETONESUR", "PROTEINUR", "UROBILINOGEN", "NITRITE", "LEUKOCYTESUR" Sepsis Labs: '@LABRCNTIP'$ (procalcitonin:4,lacticidven:4)  ) Recent Results (from the past 240 hour(s))  Surgical pcr screen     Status: None   Collection Time: 08/07/22  5:08 PM   Specimen: Nasal Mucosa; Nasal Swab  Result Value Ref Range Status   MRSA, PCR NEGATIVE NEGATIVE Final   Staphylococcus aureus NEGATIVE NEGATIVE Final    Comment: (NOTE) The Xpert SA Assay (FDA approved for NASAL specimens in patients 83 years of age and older), is one component of a comprehensive surveillance program. It is not intended to diagnose infection nor to guide or monitor treatment. Performed at [facility], [place] [address], [place], [place] [address]          Radiology Studies: DG FEMUR, [place] 2 VIEWS RIGHT  Result Date: 08/08/2022 CLINICAL DATA:  Elective surgery. EXAM: RIGHT FEMUR 2 VIEWS COMPARISON:  Preoperative imaging. FINDINGS: Four fluoroscopic spot views of the right proximal femur obtained in the operating room. Interval placement of intramedullary nail with distal locking and trans trochanteric screw. Fluoroscopy time 25 seconds. Dose 3.7 mGy. IMPRESSION: Intraoperative fluoroscopy during right femur ORIF. Electronically Signed   By: Keith Rake M.D.   On: 08/08/2022 10:13   DG C-Arm 1-60 Min-No Report  Result Date: 08/08/2022 Fluoroscopy was utilized by the requesting physician.  No radiographic interpretation.   DG Knee Right  Port  Result Date: 08/07/2022 CLINICAL DATA:  Hip fracture. EXAM: PORTABLE RIGHT KNEE - 1-2 VIEW COMPARISON:  None available FINDINGS: No acute bony abnormality. Specifically, no fracture, subluxation, or dislocation. Joint space narrowing and early spurring. No joint effusion. IMPRESSION: No acute bony abnormality. Electronically Signed   By: Rolm Baptise M.D.   On: 08/07/2022 17:13   DG Hip Unilat With Pelvis 2-3 Views Right  Result Date: 08/07/2022 CLINICAL DATA:  Pain after fall.  Injured right side. EXAM: DG HIP (WITH OR WITHOUT PELVIS) 2-3V RIGHT COMPARISON:  None Available. FINDINGS: Displaced intertrochanteric right proximal femur fracture with involvement of the greater and lesser trochanters. There is mild associated angulation. Femoral head remains seated. Bony pelvis and pubic rami are intact. Overlying artifact. IMPRESSION: Displaced intertrochanteric right proximal femur fracture. Electronically Signed   By: Keith Rake M.D.   On: 08/07/2022 13:05   DG Chest 1 View  Result Date: 08/07/2022 CLINICAL DATA:  Pain after fall.  Injured right side. EXAM: CHEST  1 VIEW COMPARISON:  Radiograph 07/27/2018 FINDINGS: Heart is normal in size for technique. Normal mediastinal contours. Mild chronic bronchitic change without acute airspace disease. Chronic biapical pleuroparenchymal scarring. No pneumothorax or large pleural effusion. On limited assessment, no acute osseous finding. IMPRESSION: 1. No acute findings or evidence of traumatic injury. 2. Mild  chronic bronchitic change. Electronically Signed   By: Keith Rake M.D.   On: 08/07/2022 13:04        Scheduled Meds:  acetaminophen  1,000 mg Oral Once   fentaNYL       [MAR Hold] loratadine  10 mg Oral Daily   [MAR Hold] pantoprazole  40 mg Oral Daily   povidone-iodine  2 Application Topical Once   Continuous Infusions:  sodium chloride 125 mL/hr at 08/08/22 0850   acetaminophen     methocarbamol (ROBAXIN) IV       LOS: 1  day    Time spent: 35 minutes    Dana Allan, MD  [facility] Pager #: [phone] 7PM-7AM contact night coverage as above

## 2022-08-08 NOTE — Plan of Care (Signed)
  Problem: Education: Goal: Knowledge of General Education information will improve Description: Including pain rating scale, medication(s)/side effects and non-pharmacologic comfort measures Outcome: Progressing   Problem: Health Behavior/Discharge Planning: Goal: Ability to manage health-related needs will improve Outcome: Progressing   Problem: Nutrition: Goal: Adequate nutrition will be maintained Outcome: Progressing   Problem: Coping: Goal: Level of anxiety will decrease Outcome: Progressing   Problem: Pain Managment: Goal: General experience of comfort will improve Outcome: Progressing   Problem: Activity: Goal: Ability to ambulate and perform ADLs will improve Outcome: Progressing   Problem: Pain Management: Goal: Pain level will decrease Outcome: Progressing

## 2022-08-08 NOTE — Anesthesia Preprocedure Evaluation (Signed)
Anesthesia Evaluation  Patient identified by MRN, date of birth, ID band Patient awake    Reviewed: Allergy & Precautions, NPO status , Patient's Chart, lab work & pertinent test results  History of Anesthesia Complications Negative for: history of anesthetic complications  Airway Mallampati: III  TM Distance: >3 FB Neck ROM: Full    Dental  (+) Teeth Intact, Dental Advisory Given   Pulmonary neg shortness of breath, asthma , neg sleep apnea, neg COPD, neg recent URI   breath sounds clear to auscultation       Cardiovascular negative cardio ROS  Rhythm:Regular     Neuro/Psych  Headaches PSYCHIATRIC DISORDERS Anxiety     Multiple back procedures  Neuromuscular disease    GI/Hepatic Neg liver ROS,GERD  Medicated and Controlled,,  Endo/Other  negative endocrine ROS    Renal/GU Lab Results      Component                Value               Date                      CREATININE               0.60                08/08/2022                Musculoskeletal  (+) Arthritis ,  Fibromyalgia -Right hip fx   Abdominal   Peds  Hematology negative hematology ROS (+) Lab Results      Component                Value               Date                      WBC                      5.9                 08/08/2022                HGB                      12.6                08/08/2022                HCT                      37.8                08/08/2022                MCV                      91.5                08/08/2022                PLT                      142 (L)             08/08/2022              Anesthesia  Other Findings   Reproductive/Obstetrics                             Anesthesia Physical Anesthesia Plan  ASA: 2  Anesthesia Plan: General   Post-op Pain Management: Ofirmev IV (intra-op)*   Induction: Intravenous  PONV Risk Score and Plan: 3 and Ondansetron and Dexamethasone  Airway  Management Planned: Oral ETT  Additional Equipment: None  Intra-op Plan:   Post-operative Plan: Extubation in OR  Informed Consent: I have reviewed the patients History and Physical, chart, labs and discussed the procedure including the risks, benefits and alternatives for the proposed anesthesia with the patient or authorized representative who has indicated his/her understanding and acceptance.     Dental advisory given  Plan Discussed with: CRNA  Anesthesia Plan Comments:        Anesthesia Quick Evaluation

## 2022-08-08 NOTE — Transfer of Care (Signed)
Immediate Anesthesia Transfer of Care Note  Patient: Leslie Dixon  Procedure(s) Performed: Procedure(s): INTRAMEDULLARY (IM) NAIL INTERTROCHANTERIC (Right)  Patient Location: PACU  Anesthesia Type:General  Level of Consciousness:  sedated, patient cooperative and responds to stimulation  Airway & Oxygen Therapy:Patient Spontanous Breathing and Patient connected to face mask oxgen  Post-op Assessment:  Report given to PACU RN and Post -op Vital signs reviewed and stable  Post vital signs:  Reviewed and stable  Last Vitals:  Vitals:   08/08/22 0548 08/08/22 0715  BP: (!) 113/58 136/62  Pulse: 62 73  Resp: 18 20  Temp: 36.8 C   SpO2: 47% 53%    Complications: No apparent anesthesia complications

## 2022-08-08 NOTE — Consult Note (Signed)
ORTHOPAEDIC CONSULTATION  REQUESTING PHYSICIAN: Bonnell Public, MD  PCP:  Janie Morning, DO  Chief Complaint: Right hip injury  HPI: Leslie Dixon is a 77 y.o. female with a past medical history significant for seasonal allergies, osteoarthritis, asthma, chronic back pain, right-sided sciatica, fibromyalgia, right foot drop, GERD, horseshoe kidney, leukopenia, migraines, osteoporosis, sinusitis, and vertigo who tripped and fell at home, injuring her right hip.  She had immediate right hip pain and inability to weight-bear.  She presented to the emergency department at [facility], where x-rays revealed a displaced right femoral neck fracture.  She was admitted by the hospitalist service for perioperative risk stratification and medical optimization.  Orthopedic consultation was obtained for management of her right hip fracture.  She denies other injuries.  She is normally very active.  She attends a vegetable garden among other activities.  She ambulates without any assist device at baseline.  Past Medical History:  Diagnosis Date   Allergy    Arthritis    Asthma    Blood dyscrasia    LEUKOPENIA- DR. CHISM - NO TREATMENT BUT IS MONITORED   Cataract    Chronic back pain    Chronic back pain    Fibromyalgia    Foot drop, right    RELATED TO LUMBAR PROBLEMS   GERD (gastroesophageal reflux disease)    PRILOSEC PRN   History of kidney stones    Horseshoe kidney    BILATERAL   Internal hemorrhoids    Leukopenia    Migraine    OCCULAR MIGRAINES - AURA WITH EYES   Osteoporosis    Pain    LUMBAR PAIN - RECENT FALL BECAUSE LEGS GAVE OUT - PT HAS HAD LUMBAR INJECTION SINCE THE FALL THAT HAS HELPED BACK PAIN   Pain    CHRONIC RIGHT UPPER QUADRANT PAIN - RELATED TO GALLBLADDER PROBLEM   Sciatica of right side    Sinusitis    f/u with Dr. Janace Hoard.    Vertigo    Past Surgical History:  Procedure Laterality Date   [place]    BILATERAL SALPINGOOPHORECTOMY  2000   BREAST EXCISIONAL BIOPSY Right 1979   CHOLECYSTECTOMY N/A 04/12/2014   Procedure: LAPAROSCOPIC CHOLECYSTECTOMY WITH INTRAOPERATIVE CHOLANGIOGRAM;  Surgeon: Kaylyn Lim, MD;  Location: [facility];  Service: General;  Laterality: N/A;   Lumbar/cervical surgeries     CERVICAL FUSION C7-C6 WITH BONE GRAFT; C6-C5 WITH PLATE AND 4 SCREWS;  3 LUMBAR SURGERIES BUT NO FUSION   NASAL SINUS SURGERY  2010   right knee     ARTHROSCOPY   TONSILLECTOMY     TYMPANOSTOMY TUBE PLACEMENT  07/2017   WISDOM TOOTH PULLED     Social History   Socioeconomic History   Marital status: Married    Spouse name: Morris   Number of children: 2   Years of education: Engineer, agricultural education level: Not on file  Occupational History    Comment: retired Press photographer  Tobacco Use   Smoking status: Never   Smokeless tobacco: Never  Vaping Use   Vaping Use: Never used  Substance and Sexual Activity   Alcohol use: No   Drug use: No   Sexual activity: Not Currently    Birth control/protection: Surgical  Other Topics Concern   Not on file  Social History Narrative   05/29/19   From: [place] area   Living: Lives with Husband Morris - x 40 years  Work: Retired from Radiographer, 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[place]: 2 dogs      Family: Has 2 children ([place] and Arnette Norris),      Enjoys: play with dogs, volunteering as Engineer, materials, [place], vegetable garden, and butterfly garden      Exercise: Rides a stationary bike 5-7 days 2 miles, walking occasionally based on weather   Diet: avoids junk food, fried foods, veggies, avoids sugar      Safety   Seat belts: Yes    Guns: Yes  and secure   Safe in relationships: Yes    Social Determinants of Health   Financial Resource Strain: Low Risk  (10/11/2019)   Overall Financial Resource Strain (CARDIA)    Difficulty of Paying Living Expenses: Not hard at all  Food Insecurity: No Food Insecurity (08/07/2022)   Hunger  Vital Sign    Worried About Running Out of Food in the Last Year: Never true    [place] in the Last Year: Never true  Transportation Needs: No Transportation Needs (08/07/2022)   PRAPARE - Hydrologist (Medical): No    Lack of Transportation (Non-Medical): No  Physical Activity: Sufficiently Active (10/11/2019)   Exercise Vital Sign    Days of Exercise per Week: 7 days    Minutes of Exercise per Session: 30 min  Stress: No Stress Concern Present (10/11/2019)   Ewa Gentry    Feeling of Stress : Not at all  Social Connections: Not on file   Family History  Problem Relation Age of Onset   Neurodegenerative disease Mother    Arthritis Mother    Hypertension Father    Hyperlipidemia Father    Alcohol abuse Father    Lung cancer Father    Breast cancer Sister 63   Asthma Sister    Migraines Sister    Colon polyps Brother    Asthma Brother    Prostate cancer Brother    Breast cancer Maternal Aunt 66   Leukemia Maternal Grandfather    Thyroid disease Daughter    Asthma Son    Colon cancer Neg Hx    Esophageal cancer Neg Hx    Rectal cancer Neg Hx    Stomach cancer Neg Hx    Allergies  Allergen Reactions   Peanut Oil Anaphylaxis   Singulair [Montelukast]     Facial swelling   Aspartame And Phenylalanine    Clarithromycin     REACTION: GI   Codeine Other (See Comments)    REACTION: nausea and vomiting   Lactose     Other reaction(s): GI Upset (intolerance)   Meloxicam Other (See Comments)    Face swelling   Other     GLUTEN RESTRICTED LACTOSE INTOLERANT ALLERGIC TO NUTS AND CORN   Penicillins     REACTION: per allergy testing/Patient states she has taken Augmentin without complications.    Pregabalin     REACTION: headache, several side effects   Sulfa Antibiotics Other (See Comments)    REACTION: Hives, wheezing   Sulfonamide Derivatives     REACTION: Hives,  wheezing   Cefdinir Rash   Glycerin Other (See Comments)    Migraines, eye problems   Prior to Admission medications   Medication Sig Start Date End Date Taking? Authorizing Provider  albuterol (VENTOLIN HFA) 108 (90 Base) MCG/ACT inhaler Inhale 2 puffs into the lungs every 6 (six) hours as needed for wheezing or  shortness of breath. 10/22/21  Yes Tyler Pita, MD  Cholecalciferol (VITAMIN D-3) 125 MCG (5000 UT) TABS Take 1 tablet by mouth daily.   Yes [provider]  Fluticasone Furoate (ARNUITY ELLIPTA) 100 MCG/ACT AEPB USE 1 INHALATION DAILY 06/30/22  Yes Tyler Pita, MD  loratadine (CLARITIN) 10 MG tablet Take 10 mg by mouth daily.   Yes [provider]  omeprazole (PRILOSEC) 20 MG capsule Take 1 capsule (20 mg total) by mouth daily. 03/03/22  Yes Thornton Park, MD  triamcinolone (NASACORT) 55 MCG/ACT AERO nasal inhaler Place 2 sprays into the nose daily as needed (allergies).   Yes [provider]  TURMERIC PO Take 2,000 Units by mouth daily at 8 pm.   Yes [provider]  vitamin B-12 (CYANOCOBALAMIN) 1000 MCG tablet Take 1,000 mcg by mouth once a week.   Yes [provider]  vitamin E 400 UNIT capsule Take 400 Units by mouth in the morning and at bedtime.   Yes [provider]  famotidine (PEPCID) 20 MG tablet Take 1 tablet (20 mg total) by mouth 2 (two) times daily. Patient not taking: Reported on 08/07/2022 02/24/22   Thornton Park, MD   DG Knee Right Port  Result Date: 08/07/2022 CLINICAL DATA:  Hip fracture. EXAM: PORTABLE RIGHT KNEE - 1-2 VIEW COMPARISON:  None available FINDINGS: No acute bony abnormality. Specifically, no fracture, subluxation, or dislocation. Joint space narrowing and early spurring. No joint effusion. IMPRESSION: No acute bony abnormality. Electronically Signed   By: Rolm Baptise M.D.   On: 08/07/2022 17:13   DG Hip Unilat With Pelvis 2-3 Views Right  Result Date: 08/07/2022 CLINICAL DATA:   Pain after fall.  Injured right side. EXAM: DG HIP (WITH OR WITHOUT PELVIS) 2-3V RIGHT COMPARISON:  None Available. FINDINGS: Displaced intertrochanteric right proximal femur fracture with involvement of the greater and lesser trochanters. There is mild associated angulation. Femoral head remains seated. Bony pelvis and pubic rami are intact. Overlying artifact. IMPRESSION: Displaced intertrochanteric right proximal femur fracture. Electronically Signed   By: Keith Rake M.D.   On: 08/07/2022 13:05   DG Chest 1 View  Result Date: 08/07/2022 CLINICAL DATA:  Pain after fall.  Injured right side. EXAM: CHEST  1 VIEW COMPARISON:  Radiograph 07/27/2018 FINDINGS: Heart is normal in size for technique. Normal mediastinal contours. Mild chronic bronchitic change without acute airspace disease. Chronic biapical pleuroparenchymal scarring. No pneumothorax or large pleural effusion. On limited assessment, no acute osseous finding. IMPRESSION: 1. No acute findings or evidence of traumatic injury. 2. Mild chronic bronchitic change. Electronically Signed   By: Keith Rake M.D.   On: 08/07/2022 13:04    Positive ROS: All other systems have been reviewed and were otherwise negative with the exception of those mentioned in the HPI and as above.  Physical Exam: General: Alert, no acute distress Cardiovascular: No pedal edema Respiratory: No cyanosis, no use of accessory musculature GI: No organomegaly, abdomen is soft and non-tender Skin: No lesions in the area of chief complaint Neurologic: Sensation intact distally Psychiatric: Patient is competent for consent with normal mood and affect Lymphatic: No axillary or cervical lymphadenopathy  MUSCULOSKELETAL: Examination of the right hip reveals no skin wounds or lesions.  She is shortened and externally rotated.  Pain with attempted logrolling of the hip.  Partial foot drop.  2+ DP pulses.  Assessment: Displaced, comminuted right intertrochanteric femur  fracture  Plan: I discussed the findings with the patient.  She has a displaced,  unstable right intertrochanteric femur fracture that requires surgical stabilization.  We discussed the risk, benefits, and alternatives to intramedullary fixation of the right femur.  Please see statement of risk.  Plan for surgery today.  Continue NPO.  Hold chemical DVT prophylaxis.  All questions were solicited and answered.  The risks, benefits, and alternatives were discussed with the patient. There are risks associated with the surgery including, but not limited to, problems with anesthesia (death), infection, differences in leg length/angulation/rotation, fracture of bones, loosening or failure of implants, malunion, nonunion, hematoma (blood accumulation) which may require surgical drainage, blood clots, pulmonary embolism, nerve injury, and blood vessel injury. The patient understands these risks and elects to proceed.   Bertram Savin, MD [phone]    08/08/2022 8:50 AM

## 2022-08-08 NOTE — Op Note (Signed)
OPERATIVE REPORT  SURGEON: Rod Can, MD   ASSISTANT: Staff.  PREOPERATIVE DIAGNOSIS: Right intertrochanteric femur fracture.   POSTOPERATIVE DIAGNOSIS: Right intertrochanteric femur fracture.   PROCEDURE: Intramedullary fixation, Right femur.   IMPLANTS: Biomet Affixus Hip Fracture Nail, 9 by 180 mm, 125 degrees. 10.5 x 100 mm Hip Fracture Nail Lag Screw. 5 x 34 mm distal interlocking screw 1.  ANESTHESIA:  General  ESTIMATED BLOOD LOSS:-25 mL    ANTIBIOTICS: 2 g Ancef.  DRAINS: None.  COMPLICATIONS: None.   CONDITION: PACU - hemodynamically stable.Marland Kitchen   BRIEF CLINICAL NOTE: Leslie Dixon is a 77 y.o. female who presented with an intertrochanteric femur fracture. The patient was admitted to the hospitalist service and underwent perioperative risk stratification and medical optimization. The risks, benefits, and alternatives to the procedure were explained, and the patient elected to proceed.  PROCEDURE IN DETAIL: Surgical site was marked by myself. The patient was taken to the operating room and anesthesia was induced on the bed. The patient was then transferred to the [facility] table and the nonoperative lower extremity was scissored underneath the operative side. The fracture was reduced with traction, internal rotation, and adduction. The hip was prepped and draped in the normal sterile surgical fashion. Timeout was called verifying side and site of surgery. Preop antibiotics were given with 60 minutes of beginning the procedure.  Fluoroscopy was used to define the patient's anatomy. A 4 cm incision was made just proximal to the tip of the greater trochanter. The awl was used to obtain the standard starting point for a trochanteric entry nail under fluoroscopic control. The guidepin was placed. The entry reamer was used to open the proximal femur.  On the back table, the nail was assembled onto the jig. The nail was placed into the femur without any difficulty. Through a  separate stab incision, the cannula was placed down to the bone in preparation for the cephalomedullary device. A guidepin was placed into the femoral head using AP and lateral fluoroscopy views. The pin was measured, and then reaming was performed to the appropriate depth. The lag screw was inserted to the appropriate depth. The fracture was compressed through the jig. The setscrew was tightened and then loosened one quarter turn. A separate stab incision was created, and the distal interlocking screw was placed using standard AO technique. The jig was removed. Final AP and lateral fluoroscopy views were obtained to confirm fracture reduction and hardware placement. Tip apex distance was appropriate. There was no chondral penetration.  The wounds were copiously irrigated with saline. The wound was closed in layers with #1 Vicryl for the fascia, 2-0 Monocryl for the deep dermal layer, and skin staples. Dermabond was applied to the skin. Once the glue was fully hardened, sterile dressing was applied. The patient was then awakened from anesthesia and taken to the PACU in stable condition. Sponge needle and instrument counts were correct at the end of the case 2. There were no known complications.  We will readmit the patient to the hospitalist. Weightbearing status will be weightbearing as tolerated with a walker. We will begin ASA for DVT prophylaxis. The patient will work with physical therapy and undergo disposition planning.

## 2022-08-08 NOTE — Discharge Instructions (Signed)
 Dr. Nicholle Falzon Adult Hip & Knee Specialist [facility] [address] [place], [place] [address] [phone]   POSTOPERATIVE DIRECTIONS    Hip Rehabilitation, Guidelines Following Surgery   WEIGHT BEARING Weight bearing as tolerated with assist device (walker, cane, etc) as directed, use it as long as suggested by your surgeon or therapist, typically at least 4-6 weeks.   HOME CARE INSTRUCTIONS  Remove items at home which could result in a fall. This includes throw rugs or furniture in walking pathways.  Continue medications as instructed at time of discharge.  You may have some home medications which will be placed on hold until you complete the course of blood thinner medication.  4 days after discharge, you may start showering. No tub baths or soaking your incisions. Do not put on socks or shoes without following the instructions of your caregivers.   Sit on chairs with arms. Use the chair arms to help push yourself up when arising.  Arrange for the use of a toilet seat elevator so you are not sitting low.   Walk with walker as instructed.  You may resume a sexual relationship in one month or when given the OK by your caregiver.  Use walker as long as suggested by your caregivers.  Avoid periods of inactivity such as sitting longer than an hour when not asleep. This helps prevent blood clots.  You may return to work once you are cleared by your surgeon.  Do not drive a car for 6 weeks or until released by your surgeon.  Do not drive while taking narcotics.  Wear elastic stockings for two weeks following surgery during the day but you may remove then at night.  Make sure you keep all of your appointments after your operation with all of your doctors and caregivers. You should call the office at the above phone number and make an appointment for approximately two weeks after the date of your surgery. Please pick up a stool softener and laxative  for home use as long as you are requiring pain medications.  ICE to the affected hip every three hours for 30 minutes at a time and then as needed for pain and swelling. Continue to use ice on the hip for pain and swelling from surgery. You may notice swelling that will progress down to the foot and ankle.  This is normal after surgery.  Elevate the leg when you are not up walking on it.   It is important for you to complete the blood thinner medication as prescribed by your doctor.  Continue to use the breathing machine which will help keep your temperature down.  It is common for your temperature to cycle up and down following surgery, especially at night when you are not up moving around and exerting yourself.  The breathing machine keeps your lungs expanded and your temperature down.  RANGE OF MOTION AND STRENGTHENING EXERCISES  These exercises are designed to help you keep full movement of your hip joint. Follow your caregiver's or physical therapist's instructions. Perform all exercises about fifteen times, three times per day or as directed. Exercise both hips, even if you have had only one joint replacement. These exercises can be done on a training (exercise) mat, on the floor, on a table or on a bed. Use whatever works the best and is most comfortable for you. Use music or television while you are exercising so that the exercises are a pleasant break in your day. This   will make your life better with the exercises acting as a break in routine you can look forward to.  Lying on your back, slowly slide your foot toward your buttocks, raising your knee up off the floor. Then slowly slide your foot back down until your leg is straight again.  Lying on your back spread your legs as far apart as you can without causing discomfort.  Lying on your side, raise your upper leg and foot straight up from the floor as far as is comfortable. Slowly lower the leg and repeat.  Lying on your back, tighten up the  muscle in the front of your thigh (quadriceps muscles). You can do this by keeping your leg straight and trying to raise your heel off the floor. This helps strengthen the largest muscle supporting your knee.  Lying on your back, tighten up the muscles of your buttocks both with the legs straight and with the knee bent at a comfortable angle while keeping your heel on the floor.   SKILLED REHAB INSTRUCTIONS: If the patient is transferred to a skilled rehab facility following release from the hospital, a list of the current medications will be sent to the facility for the patient to continue.  When discharged from the skilled rehab facility, please have the facility set up the patient's Home Health Physical Therapy prior to being released. Also, the skilled facility will be responsible for providing the patient with their medications at time of release from the facility to include their pain medication and their blood thinner medication. If the patient is still at the rehab facility at time of the two week follow up appointment, the skilled rehab facility will also need to assist the patient in arranging follow up appointment in our office and any transportation needs.  MAKE SURE YOU:  Understand these instructions.  Will watch your condition.  Will get help right away if you are not doing well or get worse.  Pick up stool softner and laxative for home use following surgery while on pain medications. Daily dry dressing changes as needed. In 4 days, you may remove your dressings and begin taking showers - no tub baths or soaking the incisions. Continue to use ice for pain and swelling after surgery. Do not use any lotions or creams on the incision until instructed by your surgeon.   

## 2022-08-08 NOTE — Anesthesia Procedure Notes (Signed)
Procedure Name: Intubation Date/Time: 08/08/2022 9:17 AM  Performed by: Lavina Hamman, CRNAPre-anesthesia Checklist: Patient identified, Emergency Drugs available, Suction available, Patient being monitored and Timeout performed Patient Re-evaluated:Patient Re-evaluated prior to induction Oxygen Delivery Method: Circle system utilized Preoxygenation: Pre-oxygenation with 100% oxygen Induction Type: IV induction Ventilation: Mask ventilation without difficulty Laryngoscope Size: Mac and 3 Grade View: Grade III Tube type: Oral Tube size: 7.0 mm Number of attempts: 2 Airway Equipment and Method: Stylet Placement Confirmation: ETT inserted through vocal cords under direct vision, positive ETCO2, CO2 detector and breath sounds checked- equal and bilateral Secured at: 21 cm Tube secured with: Tape Dental Injury: Teeth and Oropharynx as per pre-operative assessment  Comments: ATOI, G3 by Jaymon Dudek and Moser.  1st attempt to stomach.

## 2022-08-09 DIAGNOSIS — S72001A Fracture of unspecified part of neck of right femur, initial encounter for closed fracture: Secondary | ICD-10-CM | POA: Diagnosis not present

## 2022-08-09 LAB — CBC
HCT: 38 % (ref 36.0–46.0)
Hemoglobin: 12.2 g/dL (ref 12.0–15.0)
MCH: 30.1 pg (ref 26.0–34.0)
MCHC: 32.1 g/dL (ref 30.0–36.0)
MCV: 93.8 fL (ref 80.0–100.0)
Platelets: 131 10*3/uL — ABNORMAL LOW (ref 150–400)
RBC: 4.05 MIL/uL (ref 3.87–5.11)
RDW: 16 % — ABNORMAL HIGH (ref 11.5–15.5)
WBC: 6.9 10*3/uL (ref 4.0–10.5)
nRBC: 0 % (ref 0.0–0.2)

## 2022-08-09 LAB — RENAL FUNCTION PANEL
Albumin: 3.4 g/dL — ABNORMAL LOW (ref 3.5–5.0)
Anion gap: 5 (ref 5–15)
BUN: 16 mg/dL (ref 8–23)
CO2: 23 mmol/L (ref 22–32)
Calcium: 8.1 mg/dL — ABNORMAL LOW (ref 8.9–10.3)
Chloride: 110 mmol/L (ref 98–111)
Creatinine, Ser: 0.61 mg/dL (ref 0.44–1.00)
GFR, Estimated: >60 mL/min (ref >60)
Glucose, Bld: 111 mg/dL — ABNORMAL HIGH (ref 70–99)
Phosphorus: 2.9 mg/dL (ref 2.5–4.6)
Potassium: 4.1 mmol/L (ref 3.5–5.1)
Sodium: 138 mmol/L (ref 135–145)

## 2022-08-09 LAB — MAGNESIUM: Magnesium: 2.4 mg/dL (ref 1.7–2.4)

## 2022-08-09 MED ORDER — TRAMADOL HCL 50 MG PO TABS: 50.0000 mg | ORAL_TABLET | Freq: Four times a day (QID) | ORAL | 0 refills | Status: DC | PRN

## 2022-08-09 MED ORDER — ASPIRIN 81 MG PO CHEW: 81.0000 mg | CHEWABLE_TABLET | Freq: Two times a day (BID) | ORAL | 0 refills | Status: DC

## 2022-08-09 MED ORDER — ADULT MULTIVITAMIN W/MINERALS CH
1.0000 | ORAL_TABLET | Freq: Every day | ORAL | Status: DC
  Administered 2022-08-10 – 2022-08-11 (×2): 1 via ORAL
  Filled 2022-08-09 (×2): qty 1

## 2022-08-09 MED ORDER — ENOXAPARIN SODIUM 40 MG/0.4ML IJ SOSY
40.0000 mg | PREFILLED_SYRINGE | INTRAMUSCULAR | Status: DC
  Administered 2022-08-09 – 2022-08-10 (×2): 40 mg via SUBCUTANEOUS
  Filled 2022-08-09 (×2): qty 0.4

## 2022-08-09 MED ORDER — METHOCARBAMOL 500 MG PO TABS: 500.0000 mg | ORAL_TABLET | Freq: Four times a day (QID) | ORAL | 0 refills | Status: DC | PRN

## 2022-08-09 NOTE — Progress Notes (Signed)
Initial Nutrition Assessment  DOCUMENTATION CODES:   Not applicable  INTERVENTION:  - Regular diet.  - Encourage intake at all meals, focusing on protein rich food sources.  - Daily multivitamin to support micronutrient needs.  - Monitor weight trends.   NUTRITION DIAGNOSIS:   Increased nutrient needs related to acute illness (R hip fracture) as evidenced by estimated needs.  GOAL:   Patient will meet greater than or equal to 90% of their needs  MONITOR:   PO intake, Supplement acceptance, Weight trends  REASON FOR ASSESSMENT:   Consult Hip fracture protocol  ASSESSMENT:   38 female with past medical history significant for asthma, chronic back pain, sciatica, fibromyalgia, GERD, nephrolithiasis, osteoporosis and vertigo who was admitted with right hip fracture after a fall.  Patient resting in bedside chair at time of visit. Reports UBW of 132# and denied any recent changes in weight.  Eating well PTA, food recall as below: Breakfast: coffee and banana early and then a scrambled egg with cheese or tomatoes and blueberries later in the morning Lunch: a snack  Dinner: home cooked meal, often chicken, spaghetti, baked potato, salad  Patient reports her appetite is improved today compared to yesterday. Patient documented to have had 65% of dinner last night. She reported she had almost all her omelet and fruit for breakfast this morning. Discussed importance of eating well and emphasizing protein rich food sources to promote healing.   Medications reviewed and include: Colace, Senokot  Labs reviewed:  -   NUTRITION - FOCUSED PHYSICAL EXAM:  Flowsheet Row Most Recent Value  Orbital Region No depletion  Upper Arm Region No depletion  Thoracic and Lumbar Region No depletion  Buccal Region No depletion  Temple Region No depletion  Clavicle Bone Region Mild depletion  Clavicle and Acromion Bone Region No depletion  Scapular Bone Region Unable to assess  Dorsal Hand  Mild depletion  Patellar Region No depletion  Anterior Thigh Region No depletion  Posterior Calf Region No depletion  Edema (RD Assessment) None  Hair Reviewed  Eyes Reviewed  Mouth Reviewed  Skin Reviewed  Nails Reviewed       Diet Order:   Diet Order             Diet regular Room service appropriate? Yes; Fluid consistency: Thin  Diet effective now                   EDUCATION NEEDS:  Education needs have been addressed  Skin:  Skin Assessment: Reviewed RN Assessment  Last BM:  1/15  Height:  Ht Readings from Last 1 Encounters:  08/07/22 '5\' 6"'$  (1.676 m)   Weight:  Wt Readings from Last 1 Encounters:  08/07/22 60.3 kg    BMI:  Body mass index is 21.47 kg/m.  Estimated Nutritional Needs:  Kcal:  1700-1800 kcals Protein:  70-85 grams Fluid:  >/= 1.7L    Samson Frederic RD, LDN For contact information, refer to [facility].

## 2022-08-09 NOTE — Progress Notes (Signed)
    Subjective:  Patient reports pain as mild.  Denies N/V/CP/SOB/Abd pain. She reports some muscle spasms that have been relieved with the muscle relaxant. She denies any tingling or numbness in LE bilaterally. She states she does not want to be discharged with narcotics and only 12 tramadol in case of emergencies.  Objective:   VITALS:   Vitals:   08/08/22 1919 08/08/22 2105 08/09/22 0130 08/09/22 0508  BP: 112/86 (!) 117/59 112/83 109/87  Pulse: 63 67 71 73  Resp: '18 16 18 20  '$ Temp: 98.4 F (36.9 C) 99.1 F (37.3 C) 98.2 F (36.8 C) 98.5 F (36.9 C)  TempSrc:  Oral Oral Oral  SpO2: 99% 96% 99% 100%  Weight:      Height:        Patient sitting up in bed eating breakfast. NAD Neurologically intact ABD soft Neurovascular intact Sensation intact distally Intact pulses distally Incision: dressing C/D/I No cellulitis present Compartment soft Partial foot drop on RLE at her baseline.  Painless log rolling of the hip.   Lab Results  Component Value Date   WBC 6.9 08/09/2022   HGB 12.2 08/09/2022   HCT 38.0 08/09/2022   MCV 93.8 08/09/2022   PLT 131 (L) 08/09/2022   BMET    Component Value Date/Time   NA 138 08/09/2022 0330   NA 140 12/07/2019 0900   NA 140 01/01/2014 1009   K 4.1 08/09/2022 0330   K 4.3 01/01/2014 1009   CL 110 08/09/2022 0330   CL 107 01/09/2013 1029   CO2 23 08/09/2022 0330   CO2 24 09/21/2016 0913   CO2 25 01/01/2014 1009   GLUCOSE 111 (H) 08/09/2022 0330   GLUCOSE 76 01/01/2014 1009   GLUCOSE 88 01/09/2013 1029   BUN 16 08/09/2022 0330   BUN 21 12/07/2019 0900   BUN 20 09/21/2016 0913   BUN 16.7 01/01/2014 1009   CREATININE 0.61 08/09/2022 0330   CREATININE 0.67 09/21/2016 0913   CREATININE 0.76 02/20/2016 0758   CREATININE 0.8 01/01/2014 1009   CALCIUM 8.1 (L) 08/09/2022 0330   CALCIUM 9.5 09/21/2016 0913   CALCIUM 9.0 01/14/2016 0000   CALCIUM 9.1 01/01/2014 1009   GFRNONAA >60 08/09/2022 0330   GFRNONAA 87 09/21/2016 0913    GFRNONAA 79 02/20/2016 0758     Assessment/Plan: 1 Day Post-Op   Principal Problem:   Closed right hip fracture, initial encounter ([place]) Active Problems:   GERD (gastroesophageal reflux disease)   Osteoporosis   Environmental and seasonal allergies   Mild intermittent asthma without complication   WBAT with walker DVT ppx: Aspirin, SCDs, TEDS PO pain control PT/OT: PT has not seen yet. PT to come by today.  Dispo: Patient under care of the medical team disposition per their recommendation. Patient is wanting to go home with her husband. Rolling walker ordered. Pain medication and DVT ppx ordered.    Charlott Rakes, PA-C 08/09/2022, 7:57 AM   [facility]  [place] [address], [place], [place] [address] Phone: [phone] [email or web address] Facebook  [employer]

## 2022-08-09 NOTE — Progress Notes (Signed)
PROGRESS NOTE    Leslie Dixon  ZCH:[id number] DOB: 04/24/1946 DOA: 08/07/2022 PCP: Janie Morning, DO  Outpatient Specialists:     Brief Narrative:  Patient is a 77 female with past medical history significant for stage disease, asthma, chronic back pain, sciatica, fibromyalgia, GERD, nephrolithiasis, osteoporosis and vertigo.  Patient was admitted with right hip fracture following a fall.  Patient has undergone intramedullary fixation of right femur for right intertrochanteric femur fracture.  Patient was seen alongside patient's husband.  Pain is controlled.  No new complaints.  08/09/2022: Patient seen alongside patient's husband.  Also discussed with patient's nurse.  OT input is appreciated.  Awaiting physical therapy input.  Patient is slowly improving.  Hopefully, patient be discharged in the next 24 to 48 hours.   Assessment & Plan:   Principal Problem:   Closed right hip fracture, initial encounter ([place]) Active Problems:   GERD (gastroesophageal reflux disease)   Osteoporosis   Environmental and seasonal allergies   Mild intermittent asthma without complication   Closed right hip fracture, initial encounter ([place]) -Following a mechanical fall. -Orthopedic input is appreciated. -Patient has undergone intramedullary fixation of right femur. -Postop management as per the surgical team. 08/09/2022: OT input is noted.  Home with home health.  Awaiting physical therapy input.    Osteoporosis -Continue home medications on discharge.     GERD (gastroesophageal reflux disease) Continue omeprazole or formulary equivalent.     Environmental and seasonal allergies Continue nasal steroids as needed. Continue daily loratadine.     Mild intermittent asthma without complication Bronchodilators and inhaled steroids as needed.  Hypokalemia:  -Resolved. -Potassium is 4.1 today. -Continue to monitor renal function and electrolytes.  DVT prophylaxis: Start subcutaneous Lovenox 40  Mg p.o. once daily. Code Status: Full code Family Communication: Husband Disposition Plan: This will depend on hospital course.   Consultants:  Cardiac surgery  Procedures:  Intramedullary fixation of right femur  Antimicrobials:  IV cefazolin (perioperative)   Subjective: Right hip pain is well-controlled. Patient is slowly improving.  Objective: Vitals:   08/08/22 2105 08/09/22 0130 08/09/22 0508 08/09/22 0905  BP: (!) 117/59 112/83 109/87 94/69  Pulse: 67 71 73 88  Resp: '16 18 20   '$ Temp: 99.1 F (37.3 C) 98.2 F (36.8 C) 98.5 F (36.9 C)   TempSrc: Oral Oral Oral   SpO2: 96% 99% 100%   Weight:      Height:        Intake/Output Summary (Last 24 hours) at 08/09/2022 1238 Last data filed at 08/09/2022 2878 Gross per 24 hour  Intake 1498.66 ml  Output 1200 ml  Net 298.66 ml    Filed Weights   08/07/22 1134  Weight: 60.3 kg    Examination:  General exam: Appears calm and comfortable  Respiratory system: Clear to auscultation. Cardiovascular system: S1 & S2 heard Gastrointestinal system: Abdomen is soft and nontender.  Central nervous system: Alert and oriented. No focal neurological deficits. Extremities: No leg edema.   Data Reviewed: I have personally reviewed following labs and imaging studies  CBC: Recent Labs  Lab 08/07/22 1201 08/08/22 0323 08/09/22 0318  WBC 7.1 5.9 6.9  NEUTROABS 5.4  --   --   HGB 13.7 12.6 12.2  HCT 41.2 37.8 38.0  MCV 91.8 91.5 93.8  PLT 155 142* 131*    Basic Metabolic Panel: Recent Labs  Lab 08/07/22 1201 08/08/22 0323 08/09/22 0330  NA 138 136 138  K 3.5 3.2* 4.1  CL 108 110 110  CO2 20* 20* 23  GLUCOSE 100* 130* 111*  BUN '20 17 16  '$ CREATININE 0.73 0.60 0.61  CALCIUM 8.8* 7.8* 8.1*  MG  --   --  2.4  PHOS  --   --  2.9    GFR: Estimated Creatinine Clearance: 54.3 mL/min (by C-G formula based on SCr of 0.61 mg/dL). Liver Function Tests: Recent Labs  Lab 08/08/22 0323 08/09/22 0330  AST 23  --    ALT 17  --   ALKPHOS 46  --   BILITOT 0.7  --   PROT 5.8*  --   ALBUMIN 3.5 3.4*    No results for input(s): "LIPASE", "AMYLASE" in the last 168 hours. No results for input(s): "AMMONIA" in the last 168 hours. Coagulation Profile: Recent Labs  Lab 08/07/22 1201  INR 1.0    Cardiac Enzymes: No results for input(s): "CKTOTAL", "CKMB", "CKMBINDEX", "TROPONINI" in the last 168 hours. BNP (last 3 results) No results for input(s): "PROBNP" in the last 8760 hours. HbA1C: No results for input(s): "HGBA1C" in the last 72 hours. CBG: No results for input(s): "GLUCAP" in the last 168 hours. Lipid Profile: No results for input(s): "CHOL", "HDL", "LDLCALC", "TRIG", "CHOLHDL", "LDLDIRECT" in the last 72 hours. Thyroid Function Tests: No results for input(s): "TSH", "T4TOTAL", "FREET4", "T3FREE", "THYROIDAB" in the last 72 hours. Anemia Panel: No results for input(s): "VITAMINB12", "FOLATE", "FERRITIN", "TIBC", "IRON", "RETICCTPCT" in the last 72 hours. Urine analysis: No results found for: "COLORURINE", "APPEARANCEUR", "LABSPEC", "PHURINE", "GLUCOSEU", "HGBUR", "BILIRUBINUR", "KETONESUR", "PROTEINUR", "UROBILINOGEN", "NITRITE", "LEUKOCYTESUR" Sepsis Labs: '@LABRCNTIP'$ (procalcitonin:4,lacticidven:4)  ) Recent Results (from the past 240 hour(s))  Surgical pcr screen     Status: None   Collection Time: 08/07/22  5:08 PM   Specimen: Nasal Mucosa; Nasal Swab  Result Value Ref Range Status   MRSA, PCR NEGATIVE NEGATIVE Final   Staphylococcus aureus NEGATIVE NEGATIVE Final    Comment: (NOTE) The Xpert SA Assay (FDA approved for NASAL specimens in patients 40 years of age and older), is one component of a comprehensive surveillance program. It is not intended to diagnose infection nor to guide or monitor treatment. Performed at [facility], [place] [address] [place], [place] [address]          Radiology Studies: Pelvis Portable  Result Date: 08/08/2022 CLINICAL DATA:   Closed comminuted inter trochanteric fracture of proximal right femur. Status post IM nail. EXAM: PORTABLE PELVIS 1-2 VIEWS COMPARISON:  Preoperative imaging. FINDINGS: Intramedullary nail with trans trochanteric and distal locking screw fixation traverse intertrochanteric femur fracture. Improved fracture alignment from preoperative imaging. Recent postsurgical change includes air and edema in the soft tissues with lateral skin staples. IMPRESSION: ORIF of right intertrochanteric femur fracture. No immediate postoperative complication. Electronically Signed   By: Keith Rake M.D.   On: 08/08/2022 11:00   DG FEMUR, MIN 2 VIEWS RIGHT  Result Date: 08/08/2022 CLINICAL DATA:  Elective surgery. EXAM: RIGHT FEMUR 2 VIEWS COMPARISON:  Preoperative imaging. FINDINGS: Four fluoroscopic spot views of the right proximal femur obtained in the operating room. Interval placement of intramedullary nail with distal locking and trans trochanteric screw. Fluoroscopy time 25 seconds. Dose 3.7 mGy. IMPRESSION: Intraoperative fluoroscopy during right femur ORIF. Electronically Signed   By: Keith Rake M.D.   On: 08/08/2022 10:13   DG C-Arm 1-60 Min-No Report  Result Date: 08/08/2022 Fluoroscopy was utilized by the requesting physician.  No radiographic interpretation.   DG Knee Right Port  Result Date: 08/07/2022 CLINICAL DATA:  Hip fracture. EXAM:  PORTABLE RIGHT KNEE - 1-2 VIEW COMPARISON:  None available FINDINGS: No acute bony abnormality. Specifically, no fracture, subluxation, or dislocation. Joint space narrowing and early spurring. No joint effusion. IMPRESSION: No acute bony abnormality. Electronically Signed   By: Rolm Baptise M.D.   On: 08/07/2022 17:13   DG Hip Unilat With Pelvis 2-3 Views Right  Result Date: 08/07/2022 CLINICAL DATA:  Pain after fall.  Injured right side. EXAM: DG HIP (WITH OR WITHOUT PELVIS) 2-3V RIGHT COMPARISON:  None Available. FINDINGS: Displaced intertrochanteric right  proximal femur fracture with involvement of the greater and lesser trochanters. There is mild associated angulation. Femoral head remains seated. Bony pelvis and pubic rami are intact. Overlying artifact. IMPRESSION: Displaced intertrochanteric right proximal femur fracture. Electronically Signed   By: Keith Rake M.D.   On: 08/07/2022 13:05   DG Chest 1 View  Result Date: 08/07/2022 CLINICAL DATA:  Pain after fall.  Injured right side. EXAM: CHEST  1 VIEW COMPARISON:  Radiograph 07/27/2018 FINDINGS: Heart is normal in size for technique. Normal mediastinal contours. Mild chronic bronchitic change without acute airspace disease. Chronic biapical pleuroparenchymal scarring. No pneumothorax or large pleural effusion. On limited assessment, no acute osseous finding. IMPRESSION: 1. No acute findings or evidence of traumatic injury. 2. Mild chronic bronchitic change. Electronically Signed   By: Keith Rake M.D.   On: 08/07/2022 13:04        Scheduled Meds:  aspirin EC  325 mg Oral Q breakfast   docusate sodium  100 mg Oral BID   loratadine  10 mg Oral Daily   pantoprazole  40 mg Oral Daily   senna  1 tablet Oral BID   Continuous Infusions:  sodium chloride Stopped (08/08/22 1722)   methocarbamol (ROBAXIN) IV       LOS: 2 days    Time spent: 35 minutes    Dana Allan, MD  [facility] Pager #: [phone] 7PM-7AM contact night coverage as above

## 2022-08-09 NOTE — Evaluation (Signed)
Physical Therapy Evaluation Patient Details Name: Leslie Dixon MRN: [id number] DOB: 12-27-1945 Today's Date: 08/09/2022  History of Present Illness  Patient is a 77 year old female who presented after a fall on 1/13 with increased pain in RLE. Patient was found to have a right intertrochanteric femur fracture. Patient underwent a intramedullary fixation on 1/14. PMH: L4-5 surgery, DDD, asthma.  Clinical Impression  Pt admitted with above diagnosis.  Dizzy with amb, BP 77/62 after seated, was orthostatic/BP drop with OT in am Continue PT in acute setting, will need HHPT  Pt currently with functional limitations due to the deficits listed below (see PT Problem List). Pt will benefit from skilled PT to increase their independence and safety with mobility to allow discharge to the venue listed below.          Recommendations for follow up therapy are one component of a multi-disciplinary discharge planning process, led by the attending physician.  Recommendations may be updated based on patient status, additional functional criteria and insurance authorization.  Follow Up Recommendations Home health PT      Assistance Recommended at Discharge Intermittent Supervision/Assistance  Patient can return home with the following  A little help with walking and/or transfers;A little help with bathing/dressing/bathroom;Assist for transportation;Help with stairs or ramp for entrance;Assistance with cooking/housework    Equipment Recommendations Rolling walker (2 wheels)  Recommendations for Other Services       Functional Status Assessment Patient has had a recent decline in their functional status and demonstrates the ability to make significant improvements in function in a reasonable and predictable amount of time.     Precautions / Restrictions Precautions Precautions: Other (comment) Precaution Comments: monitor BP Restrictions RLE Weight Bearing: Weight bearing as tolerated       Mobility  Bed Mobility   Bed Mobility: Supine to Sit     Supine to sit: Min assist     General bed mobility comments: to advance RLE to EOB with increased time    Transfers Overall transfer level: Needs assistance Equipment used: Rolling walker (2 wheels) Transfers: Sit to/from Stand Sit to Stand: Min assist, Min guard           General transfer comment: cues for hand placement and to power up with LLE >RLE    Ambulation/Gait Ambulation/Gait assistance: Min assist Gait Distance (Feet): 25 Feet Assistive device: Rolling walker (2 wheels) Gait Pattern/deviations: Step-to pattern, Decreased stance time - right       General Gait Details: cues for sequence and RW position, intermittent assist to steady; dizzy after above distance; BP 77/62, HR 71  Stairs            Wheelchair Mobility    Modified Rankin (Stroke Patients Only)       Balance Overall balance assessment: Needs assistance Sitting-balance support: Feet supported Sitting balance-Leahy Scale: Fair     Standing balance support: During functional activity, Reliant on assistive device for balance Standing balance-Leahy Scale: Poor                               Pertinent Vitals/Pain Pain Assessment Pain Assessment: Faces Faces Pain Scale: Hurts little more Pain Location: RLE, thigh Pain Descriptors / Indicators: Discomfort, Sore, Tightness Pain Intervention(s): Limited activity within patient's tolerance, Monitored during session, Premedicated before session, Repositioned    Home Living Family/patient expects to be discharged to:: Private residence Living Arrangements: Spouse/significant other Available Help at Discharge: Family;Available 24 hours/day  Type of Home: House Home Access: Stairs to enter Entrance Stairs-Rails: Right Entrance Stairs-Number of Steps: 2 up to porch and two more into house from garage   Home Layout: One level Home Equipment: [place] - single  point Additional Comments: unique canes that were hand crafted.    Prior Function Prior Level of Function : Independent/Modified Independent                     Hand Dominance        Extremity/Trunk Assessment   Upper Extremity Assessment Upper Extremity Assessment: Defer to OT evaluation;Overall WFL for tasks assessed    Lower Extremity Assessment Lower Extremity Assessment: RLE deficits/detail RLE Deficits / Details: ankle WFL , knee and hip 2+/5 limited by pain       Communication      Cognition Arousal/Alertness: Awake/alert Behavior During Therapy: WFL for tasks assessed/performed Overall Cognitive Status: Within Functional Limits for tasks assessed                                          General Comments      Exercises     Assessment/Plan    PT Assessment Patient needs continued PT services  PT Problem List Decreased strength;Decreased range of motion;Decreased activity tolerance;Decreased balance;Decreased mobility;Decreased knowledge of precautions;Decreased knowledge of use of DME;Pain       PT Treatment Interventions DME instruction;Gait training;Therapeutic exercise;Functional mobility training;Therapeutic activities;Patient/family education;Stair training    PT Goals (Current goals can be found in the Care Plan section)  Acute Rehab PT Goals PT Goal Formulation: With patient Time For Goal Achievement: 08/23/22 Potential to Achieve Goals: Good    Frequency Min 3X/week     Co-evaluation               AM-PAC PT "6 Clicks" Mobility  Outcome Measure Help needed turning from your back to your side while in a flat bed without using bedrails?: A Little Help needed moving from lying on your back to sitting on the side of a flat bed without using bedrails?: A Little Help needed moving to and from a bed to a chair (including a wheelchair)?: A Little Help needed standing up from a chair using your arms (e.g., wheelchair or  bedside chair)?: A Little Help needed to walk in hospital room?: A Little Help needed climbing 3-5 steps with a railing? : A Lot 6 Click Score: 17    End of Session Equipment Utilized During Treatment: Gait belt Activity Tolerance: Patient tolerated treatment well Patient left: in chair;with call bell/phone within reach;with chair alarm set   PT Visit Diagnosis: Other abnormalities of gait and mobility (R26.89);Difficulty in walking, not elsewhere classified (R26.2)    Time: 5945-8592 PT Time Calculation (min) (ACUTE ONLY): 14 min   Charges:   PT Evaluation $PT Eval Low Complexity: [place], PT  Acute Rehab Dept [facility]) [phone]  [facility] Weekend Pager [facility] only)  [phone]  08/09/2022   [facility] 08/09/2022, 3:17 PM

## 2022-08-09 NOTE — Evaluation (Signed)
Occupational Therapy Evaluation Patient Details Name: Leslie Dixon MRN: [id number] DOB: 10/31/1945 Today's Date: 08/09/2022   History of Present Illness Patient is a 77 year old female who presented after a fall on 1/13 with increased pain in RLE. Patient was found to have a right intertrochanteric femur fracture. Patient underwent a intramedullary fixation on 1/14. PMH: L4-5 surgery, DDD, asthma.   Clinical Impression   Patient is a 77 year old female who was admitted for above. Patient was independently living at home with husband and two dogs prior level . Currently, patient was noted to have low BP with transition to [facility] to void BM with BP of 78/47 mmhg with HR of 90bpm patient endorsing dizziness. Nurse called into room. Patients BP was able to recover with a few mins seated to 94/69 mmhg with HR 88bpm. Patient transitioned to recliner in room per patient request and nurse ok at end of session with husband in room. Patient was TD for LB Dressing tasks min A for bed mobility, and min A for transfers with RW impacting participation in ADLs. Patient is very motivated to transition home on 1/16. Patient would continue to benefit from skilled OT services at this time while admitted and after d/c to address noted deficits in order to improve overall safety and independence in ADLs.       Recommendations for follow up therapy are one component of a multi-disciplinary discharge planning process, led by the attending physician.  Recommendations may be updated based on patient status, additional functional criteria and insurance authorization.   Follow Up Recommendations  Home health OT     Assistance Recommended at Discharge Frequent or constant Supervision/Assistance  Patient can return home with the following A little help with walking and/or transfers;A lot of help with bathing/dressing/bathroom;Assistance with cooking/housework;Direct supervision/assist for medications management;Assist for  transportation;Help with stairs or ramp for entrance;Direct supervision/assist for financial management    Functional Status Assessment  Patient has had a recent decline in their functional status and demonstrates the ability to make significant improvements in function in a reasonable and predictable amount of time.  Equipment Recommendations  Other (comment) (total hip kit)       Precautions / Restrictions Precautions Precaution Comments: monitor BP Restrictions Weight Bearing Restrictions: Yes RLE Weight Bearing: Weight bearing as tolerated      Mobility Bed Mobility Overal bed mobility: Needs Assistance Bed Mobility: Supine to Sit     Supine to sit: Min assist     General bed mobility comments: to advance RLE to EOB with increased time      Balance Overall balance assessment: Mild deficits observed, not formally tested         ADL either performed or assessed with clinical judgement   ADL Overall ADL's : Needs assistance/impaired Eating/Feeding: Modified independent;Sitting   Grooming: Brushing hair;Modified independent;Sitting Grooming Details (indicate cue type and reason): in recliner Upper Body Bathing: Set up;Sitting   Lower Body Bathing: Maximal assistance;Sit to/from stand;Sitting/lateral leans   Upper Body Dressing : Set up;Sitting   Lower Body Dressing: Maximal assistance;Total assistance;Sit to/from stand;Sitting/lateral leans   Toilet Transfer: Minimal assistance;Ambulation;Rolling walker (2 wheels) Toilet Transfer Details (indicate cue type and reason): to transfer from bed to [facility] to recliner with RW with cues for proper hand placement and physical A to slow stand to sit into chair Toileting- Clothing Manipulation and Hygiene: Total assistance;Sit to/from stand Toileting - Clothing Manipulation Details (indicate cue type and reason): unable to take BUE off RW and maintain  balance             Vision Patient Visual Report: No change from  baseline              Pertinent Vitals/Pain Pain Assessment Pain Assessment: Faces Faces Pain Scale: Hurts whole lot Pain Location: RLE     Hand Dominance Right   Extremity/Trunk Assessment Upper Extremity Assessment Upper Extremity Assessment: Overall WFL for tasks assessed   Lower Extremity Assessment Lower Extremity Assessment: Defer to PT evaluation   Cervical / Trunk Assessment Cervical / Trunk Assessment: Normal   Communication Communication Communication: No difficulties   Cognition Arousal/Alertness: Awake/alert Behavior During Therapy: WFL for tasks assessed/performed Overall Cognitive Status: Within Functional Limits for tasks assessed                    Home Living Family/patient expects to be discharged to:: Private residence Living Arrangements: Spouse/significant other Available Help at Discharge: Family;Available 24 hours/day Type of Home: House Home Access: Stairs to enter [employer] of Steps: 2 up to porch and two more into house from garage         Bathroom Shower/Tub: Walk-in shower         Home Equipment: Kasandra Knudsen - single point   Additional Comments: unique canes that were hand crafted.      Prior Functioning/Environment Prior Level of Function : Independent/Modified Independent           OT Problem List: Decreased activity tolerance;Decreased coordination;Decreased safety awareness;Decreased knowledge of precautions;Decreased knowledge of use of DME or AE;Cardiopulmonary status limiting activity;Pain      OT Treatment/Interventions: Self-care/ADL training;Energy conservation;Therapeutic exercise;DME and/or AE instruction;Therapeutic activities;Patient/family education;Balance training    OT Goals(Current goals can be found in the care plan section) Acute Rehab OT Goals Patient Stated Goal: to get home tomorrow OT Goal Formulation: With patient Time For Goal Achievement: 08/23/22 Potential to Achieve Goals: Fair   OT Frequency: Min 2X/week       AM-PAC OT "6 Clicks" Daily Activity     Outcome Measure Help from another person eating meals?: None Help from another person taking care of personal grooming?: A Little Help from another person toileting, which includes using toliet, bedpan, or urinal?: A Lot Help from another person bathing (including washing, rinsing, drying)?: A Lot Help from another person to put on and taking off regular upper body clothing?: A Little Help from another person to put on and taking off regular lower body clothing?: A Lot 6 Click Score: 16   End of Session Equipment Utilized During Treatment: Gait belt;Rolling walker (2 wheels) Nurse Communication: Mobility status;Other (comment) (nurse called into room when patient felt faint with low BP)  Activity Tolerance: Patient limited by pain Patient left: in chair;with call bell/phone within reach;with chair alarm set  OT Visit Diagnosis: Unsteadiness on feet (R26.81);Other abnormalities of gait and mobility (R26.89);Muscle weakness (generalized) (M62.81);Pain Pain - Right/Left: Right Pain - part of body: Leg                Time: 8295-6213 OT Time Calculation (min): 35 min Charges:  OT General Charges $OT Visit: 1 Visit OT Evaluation $OT Eval Moderate Complexity: 1 Mod OT Treatments $Self Care/Home Management : 8-22 mins  Rennie Plowman, MS Acute Rehabilitation Department Office# [phone]   Willa Rough 08/09/2022, 9:27 AM

## 2022-08-10 ENCOUNTER — Encounter (HOSPITAL_COMMUNITY): Payer: Self-pay | Admitting: Orthopedic Surgery

## 2022-08-10 DIAGNOSIS — J4520 Mild intermittent asthma, uncomplicated: Secondary | ICD-10-CM | POA: Diagnosis not present

## 2022-08-10 DIAGNOSIS — K219 Gastro-esophageal reflux disease without esophagitis: Secondary | ICD-10-CM | POA: Diagnosis not present

## 2022-08-10 DIAGNOSIS — S72001A Fracture of unspecified part of neck of right femur, initial encounter for closed fracture: Secondary | ICD-10-CM | POA: Diagnosis not present

## 2022-08-10 LAB — CBC
HCT: 34.5 % — ABNORMAL LOW (ref 36.0–46.0)
Hemoglobin: 11.3 g/dL — ABNORMAL LOW (ref 12.0–15.0)
MCH: 30.5 pg (ref 26.0–34.0)
MCHC: 32.8 g/dL (ref 30.0–36.0)
MCV: 93 fL (ref 80.0–100.0)
Platelets: 123 10*3/uL — ABNORMAL LOW (ref 150–400)
RBC: 3.71 MIL/uL — ABNORMAL LOW (ref 3.87–5.11)
RDW: 16.1 % — ABNORMAL HIGH (ref 11.5–15.5)
WBC: 6.1 10*3/uL (ref 4.0–10.5)
nRBC: 0 % (ref 0.0–0.2)

## 2022-08-10 LAB — BASIC METABOLIC PANEL
Anion gap: 9 (ref 5–15)
BUN: 23 mg/dL (ref 8–23)
CO2: 21 mmol/L — ABNORMAL LOW (ref 22–32)
Calcium: 8 mg/dL — ABNORMAL LOW (ref 8.9–10.3)
Chloride: 105 mmol/L (ref 98–111)
Creatinine, Ser: 0.68 mg/dL (ref 0.44–1.00)
GFR, Estimated: >60 mL/min (ref >60)
Glucose, Bld: 108 mg/dL — ABNORMAL HIGH (ref 70–99)
Potassium: 3.4 mmol/L — ABNORMAL LOW (ref 3.5–5.1)
Sodium: 135 mmol/L (ref 135–145)

## 2022-08-10 MED ORDER — POTASSIUM CHLORIDE CRYS ER 20 MEQ PO TBCR
40.0000 meq | EXTENDED_RELEASE_TABLET | Freq: Once | ORAL | Status: AC
  Administered 2022-08-10: 40 meq via ORAL
  Filled 2022-08-10: qty 2

## 2022-08-10 NOTE — Progress Notes (Signed)
Occupational Therapy Treatment Patient Details Name: KAYLONI ROCCO MRN: [id number] DOB: [birth year] Today's Date: 08/10/2022   History of present illness Patient is a 77 year old female who presented after a fall on 1/13 with increased pain in RLE. Patient was found to have a right intertrochanteric femur fracture. Patient underwent a intramedullary fixation on 1/14. PMH: L4-5 surgery, DDD, asthma.   OT comments  Patient was educated on using AE for LB dressing tasks. Patient was noted to have improved performance in toileting and dressing tasks during this session. Patient's discharge plan remains appropriate at this time. OT will continue to follow acutely.     Recommendations for follow up therapy are one component of a multi-disciplinary discharge planning process, led by the attending physician.  Recommendations may be updated based on patient status, additional functional criteria and insurance authorization.    Follow Up Recommendations  Home health OT     Assistance Recommended at Discharge Frequent or constant Supervision/Assistance  Patient can return home with the following  A little help with walking and/or transfers;A lot of help with bathing/dressing/bathroom;Assistance with cooking/housework;Direct supervision/assist for medications management;Assist for transportation;Help with stairs or ramp for entrance;Direct supervision/assist for financial management   Equipment Recommendations  Other (comment) (long handled sponge, and sock aid, patient already has 2 reachers at home)    Recommendations for Other Services      Precautions / Restrictions Precautions Precautions: Other (comment) Precaution Comments: monitor BP Restrictions Weight Bearing Restrictions: No RLE Weight Bearing: Weight bearing as tolerated       Mobility Bed Mobility       General bed mobility comments: patient was up in recliner and returned to the same.         ADL either performed or  assessed with clinical judgement   ADL Overall ADL's : Needs assistance/impaired               Lower Body Bathing Details (indicate cue type and reason): was educated on using long handled sponge. patient verbalized understanding and reported she would purchase one.     Lower Body Dressing: Set up;Supervision/safety;Sitting/lateral leans Lower Body Dressing Details (indicate cue type and reason): patient was educated on how to use AE for LB dressing tasks. patient verbalized and demonstrated understanding. patient was able to don/doff sock with reacher and sock aid sitting in recliner. patient was able to don simulated pants over RLE with set up while seated. patient reported that she always stands infront of a seat when donning pants as a safety precaution tip from a friend who fell previously Toilet Transfer: Supervision/safety;Ambulation;Rolling walker (2 wheels) Toilet Transfer Details (indicate cue type and reason): from recliner to [facility] in bathroom with increased time. Toileting- Clothing Manipulation and Hygiene: Sit to/from stand;Supervision/safety Toileting - Clothing Manipulation Details (indicate cue type and reason): with increased time              Cognition Arousal/Alertness: Awake/alert Behavior During Therapy: WFL for tasks assessed/performed Overall Cognitive Status: Within Functional Limits for tasks assessed                             Pertinent Vitals/ Pain       Pain Assessment Pain Assessment: Faces Faces Pain Scale: Hurts little more Pain Location: RLE, thigh Pain Descriptors / Indicators: Discomfort, Sore, Tightness Pain Intervention(s): Limited activity within patient's tolerance, Monitored during session, Repositioned, Premedicated before session         Frequency  Min 2X/week        Progress Toward Goals  OT Goals(current goals can now be found in the care plan section)  Progress towards OT goals: Progressing toward goals      Plan Discharge plan remains appropriate       AM-PAC OT "6 Clicks" Daily Activity     Outcome Measure   Help from another person eating meals?: None Help from another person taking care of personal grooming?: None Help from another person toileting, which includes using toliet, bedpan, or urinal?: A Little Help from another person bathing (including washing, rinsing, drying)?: A Lot Help from another person to put on and taking off regular upper body clothing?: A Little Help from another person to put on and taking off regular lower body clothing?: A Lot 6 Click Score: 18    End of Session Equipment Utilized During Treatment: Gait belt;Rolling walker (2 wheels)  OT Visit Diagnosis: Unsteadiness on feet (R26.81);Other abnormalities of gait and mobility (R26.89);Muscle weakness (generalized) (M62.81);Pain Pain - Right/Left: Right Pain - part of body: Leg   Activity Tolerance Patient limited by pain   Patient Left in chair;with call bell/phone within reach;with chair alarm set;with family/visitor present   Nurse Communication Mobility status        Time: 7564-3329 OT Time Calculation (min): 19 min  Charges: OT General Charges $OT Visit: 1 Visit OT Treatments $Self Care/Home Management : 8-22 mins  Rennie Plowman, MS Acute Rehabilitation Department Office# [phone]   Willa Rough 08/10/2022, 12:03 PM

## 2022-08-10 NOTE — Progress Notes (Signed)
Physical Therapy Treatment Patient Details Name: Leslie Dixon MRN: [id number] DOB: Nov 19, 1945 Today's Date: 08/10/2022   History of Present Illness Patient is a 77 year old female who presented after a fall on 1/13 with increased pain in RLE. Patient was found to have a right intertrochanteric femur fracture. Patient underwent a intramedullary fixation on 1/14. PMH: L4-5 surgery, DDD, asthma.    PT Comments    POD # 2 pm session General Comments: AxO x 3 very pleasant Lady Motivated.  Stated she fell while carrying a handful of laundry when she tripped. Assisted with amb an increased distance in hallway and practiced stairs.  Pt unsteady and issues of pain control.   Pt would benefit from one more night stay to ensure a safe D/C.    Recommendations for follow up therapy are one component of a multi-disciplinary discharge planning process, led by the attending physician.  Recommendations may be updated based on patient status, additional functional criteria and insurance authorization.  Follow Up Recommendations  Home health PT     Assistance Recommended at Discharge Intermittent Supervision/Assistance  Patient can return home with the following A little help with walking and/or transfers;A little help with bathing/dressing/bathroom;Assist for transportation;Help with stairs or ramp for entrance;Assistance with cooking/housework   Equipment Recommendations  Rolling walker (2 wheels);BSC/3in1    Recommendations for Other Services       Precautions / Restrictions Precautions Precautions: Fall Restrictions Weight Bearing Restrictions: No RLE Weight Bearing: Weight bearing as tolerated     Mobility  Bed Mobility                    Transfers Overall transfer level: Needs assistance Equipment used: Rolling walker (2 wheels) Transfers: Sit to/from Stand Sit to Stand: Min assist, Min guard           General transfer comment: 50% VC's on proper hand placement and  safety with turns.  Also assisted with a toilet transfer.  Unsteady.    Ambulation/Gait Ambulation/Gait assistance: Min guard, Min assist Gait Distance (Feet): 44 Feet Assistive device: Rolling walker (2 wheels) Gait Pattern/deviations: Step-to pattern, Decreased stance time - right Gait velocity: decreased     General Gait Details: 25% VC's on proper walker to self distance and 50% VC's on safety with turns.  Also assisted with amb to bathroom.   Stairs Stairs: Yes Stairs assistance: Min assist Stair Management: Two rails Number of Stairs: 2 General stair comments: 50% VC's on proper sequencing and safety   Wheelchair Mobility    Modified Rankin (Stroke Patients Only)       Balance                                            Cognition Arousal/Alertness: Awake/alert Behavior During Therapy: WFL for tasks assessed/performed Overall Cognitive Status: Within Functional Limits for tasks assessed                                 General Comments: AxO x 3 very pleasant Lady Motivated.  Stated she fell while carrying a handful of laundry when she tripped.        Exercises      General Comments        Pertinent Vitals/Pain Pain Assessment Pain Assessment: 0-10 Pain Score: 6  Pain Location: RLE, thigh  Pain Descriptors / Indicators: Discomfort, Sore, Tightness, Operative site guarding Pain Intervention(s): Monitored during session, Premedicated before session, Repositioned, Ice applied    Home Living                          Prior Function            PT Goals (current goals can now be found in the care plan section) Progress towards PT goals: Progressing toward goals    Frequency    Min 3X/week      PT Plan Current plan remains appropriate    Co-evaluation              AM-PAC PT "6 Clicks" Mobility   Outcome Measure  Help needed turning from your back to your side while in a flat bed without using  bedrails?: A Little Help needed moving from lying on your back to sitting on the side of a flat bed without using bedrails?: A Little Help needed moving to and from a bed to a chair (including a wheelchair)?: A Little Help needed standing up from a chair using your arms (e.g., wheelchair or bedside chair)?: A Little Help needed to walk in hospital room?: A Little Help needed climbing 3-5 steps with a railing? : A Little 6 Click Score: 18    End of Session Equipment Utilized During Treatment: Gait belt Activity Tolerance: Patient tolerated treatment well Patient left: in chair;with call bell/phone within reach;with chair alarm set Nurse Communication: Mobility status PT Visit Diagnosis: Other abnormalities of gait and mobility (R26.89);Difficulty in walking, not elsewhere classified (R26.2)     Time: 0102-7253 PT Time Calculation (min) (ACUTE ONLY): 24 min  Charges:  $Gait Training: 8-22 mins $Therapeutic Exercise: 8-22 mins                     Rica Koyanagi  PTA [place] Office M-F          [phone] Weekend pager [phone]

## 2022-08-10 NOTE — TOC Transition Note (Signed)
Transition of Care [facility]) - CM/SW Discharge Note  Patient Details  Name: Leslie Dixon MRN: [id number] Date of Birth: 1946/06/30  Transition of Care [facility]) CM/SW Contact:  Sherie Don, LCSW Phone Number: 08/10/2022, 11:57 AM  Clinical Narrative: PT and OT evaluations recommended HH as well as a rolling walker. CSW met with patient to discuss recommendations. Patient is agreeable to [facility] and DME referrals. [employer] will deliver rolling walker to patient's room. CSW made HHPT/OT referral to Cindie with [facility], which was accepted. CSW updated patient. TOC signing off.  Final next level of care: Home w Home Health Services Barriers to Discharge: Continued Medical Work up  Patient Goals and CMS Choice CMS Medicare.gov Compare Post Acute Care list provided to:: Patient Choice offered to / list presented to : Patient  Discharge Plan and Services Additional resources added to the After Visit Summary for          DME Arranged: Walker rolling DME Agency: [employer] Date DME Agency Contacted: 08/10/22 Time DME Agency Contacted: 2094 Representative spoke with at DME Agency: Erasmo Downer HH Arranged: PT, OT [place] Agency: [place] Date [place]: 08/10/22 Time [place]: 1137 Representative spoke with at [place]: [place] ([place]) Interventions SDOH Screenings   Food Insecurity: No Food Insecurity (08/07/2022)  Housing: Low Risk  (08/07/2022)  Transportation Needs: No Transportation Needs (08/07/2022)  Utilities: Not At Risk (08/07/2022)  Alcohol Screen: Low Risk  (10/11/2019)  Depression (PHQ2-9): Low Risk  (10/11/2019)  Financial Resource Strain: Low Risk  (10/11/2019)  Physical Activity: Sufficiently Active (10/11/2019)  Stress: No Stress Concern Present (10/11/2019)  Tobacco Use: Low Risk  (08/10/2022)   Readmission Risk Interventions     No data to display

## 2022-08-10 NOTE — Plan of Care (Signed)
  Problem: Activity: ?Goal: Risk for activity intolerance will decrease ?Outcome: Progressing ?  ?Problem: Pain Managment: ?Goal: General experience of comfort will improve ?Outcome: Progressing ?  ?Problem: Safety: ?Goal: Ability to remain free from injury will improve ?Outcome: Progressing ?  ?

## 2022-08-10 NOTE — Progress Notes (Signed)
[facility]                                                                               Leslie Dixon, is a 77 y.o. female, DOB - 1946/05/03, OAC:[id number] Admit date - 08/07/2022    Outpatient Primary MD for the patient is Janie Morning, DO  LOS - 3  days    Brief summary   Patient is a 9 female with past medical history significant for stage disease, asthma, chronic back pain, sciatica, fibromyalgia, GERD, nephrolithiasis, osteoporosis and vertigo.  Patient was admitted with right hip fracture following a fall.  Patient has undergone intramedullary fixation of right femur for right intertrochanteric femur fracture.  Patient was seen alongside patient's husband.  Pain is controlled.  No new complaints.     Assessment & Plan    Assessment and Plan:  Right hip fracture secondary to mechanical fall Orthopedics consulted and she underwent intramedullary fixation of the right femur.  Therapy evaluations recommended home with home health .   Hypotension with therapy yesterday Check orthostatic vital signs today. Patient reports that she did not feel dizzy today  GERD Continue with PPI   Mild intermittent asthma Continue with bronchodilators as needed    Hypokalemia Replaced recheck in the morning   RN Pressure Injury Documentation:    Malnutrition Type:  Nutrition Problem: Increased nutrient needs Etiology: acute illness (R hip fracture)   Malnutrition Characteristics:  Signs/Symptoms: estimated needs   Nutrition Interventions:  Interventions: Ensure Enlive (each supplement provides 350kcal and 20 grams of protein), Refer to RD note for recommendations, MVI  Estimated body mass index is 21.47 kg/m as calculated from the following:   Height as of this encounter: '5\' 6"'$  (1.676 m).   Weight as of this encounter: 60.3 kg.  Code Status: full code.  DVT Prophylaxis:  enoxaparin (LOVENOX) injection 40 mg Start: 08/09/22 1800 SCDs Start:  08/08/22 1116   Level of Care: Level of care: Med-Surg Family Communication: family at bedside.   Disposition Plan:     pain control and possible d/c in am.   Procedures:  Intramedullary fixation of the right femur.   Consultants:   [facility].   Antimicrobials:   Anti-infectives (From admission, onward)    Start     Dose/Rate Route Frequency Ordered Stop   08/08/22 1500  ceFAZolin (ANCEF) IVPB 2g/100 mL premix        2 g 200 mL/hr over 30 Minutes Intravenous Every 6 hours 08/08/22 1115 08/08/22 2141   08/08/22 0630  ceFAZolin (ANCEF) 2-4 GM/100ML-% IVPB       Note to Pharmacy: Nicholes Rough S: cabinet override      08/08/22 0630 08/08/22 0906   08/08/22 0600  ceFAZolin (ANCEF) IVPB 2g/100 mL premix        2 g 200 mL/hr over 30 Minutes Intravenous On call to O.R. 08/07/22 1619 08/08/22 0908        Medications  Scheduled Meds:  aspirin EC  325 mg Oral Q breakfast   docusate sodium  100 mg Oral BID   enoxaparin (LOVENOX) injection  40 mg Subcutaneous Q24H   loratadine  10 mg Oral Daily  multivitamin with minerals  1 tablet Oral Daily   pantoprazole  40 mg Oral Daily   senna  1 tablet Oral BID   Continuous Infusions:  sodium chloride Stopped (08/08/22 1722)   methocarbamol (ROBAXIN) IV     PRN Meds:.acetaminophen, albuterol, HYDROcodone-acetaminophen, HYDROcodone-acetaminophen, melatonin, menthol-cetylpyridinium **OR** phenol, methocarbamol **OR** methocarbamol (ROBAXIN) IV, morphine injection, ondansetron **OR** ondansetron (ZOFRAN) IV, polyethylene glycol, triamcinolone    Subjective:   Leslie Dixon was seen and examined today.  Pain is better, no new complaints.    Objective:   Vitals:   08/09/22 0905 08/09/22 1259 08/09/22 2204 08/10/22 0517  BP: 94/69 (!) 110/49 (!) 113/56 (!) 114/52  Pulse: 88 82 86 78  Resp:  '18 16 16  '$ Temp:  98.4 F (36.9 C) 98.1 F (36.7 C) 98.6 F (37 C)  TempSrc:   Oral   SpO2:  94% 95% 94%  Weight:      Height:         Intake/Output Summary (Last 24 hours) at 08/10/2022 1137 Last data filed at 08/10/2022 1000 Gross per 24 hour  Intake 120 ml  Output --  Net 120 ml   Filed Weights   08/07/22 1134  Weight: 60.3 kg     Exam General exam: Appears calm and comfortable  Respiratory system: Clear to auscultation. Respiratory effort normal. Cardiovascular system: S1 & S2 heard, RRR. No JVD,  Gastrointestinal system: Abdomen is nondistended, soft and nontender. Central nervous system: Alert and oriented. No focal neurological deficits. Extremities: painful ROM of the RLE.  Skin: No rashes, Psychiatry: Mood & affect appropriate.     Data Reviewed:  I have personally reviewed following labs and imaging studies   CBC Lab Results  Component Value Date   WBC 6.1 08/10/2022   RBC 3.71 (L) 08/10/2022   HGB 11.3 (L) 08/10/2022   HCT 34.5 (L) 08/10/2022   MCV 93.0 08/10/2022   MCH 30.5 08/10/2022   PLT 123 (L) 08/10/2022   MCHC 32.8 08/10/2022   RDW 16.1 (H) 08/10/2022   LYMPHSABS 1.1 08/07/2022   MONOABS 0.4 08/07/2022   EOSABS 0.0 08/07/2022   BASOSABS 0.0 58/52/7782     Last metabolic panel Lab Results  Component Value Date   NA 135 08/10/2022   K 3.4 (L) 08/10/2022   CL 105 08/10/2022   CO2 21 (L) 08/10/2022   BUN 23 08/10/2022   CREATININE 0.68 08/10/2022   GLUCOSE 108 (H) 08/10/2022   GFRNONAA >60 08/10/2022   GFRAA 88 12/07/2019   CALCIUM 8.0 (L) 08/10/2022   PHOS 2.9 08/09/2022   PROT 5.8 (L) 08/08/2022   ALBUMIN 3.4 (L) 08/09/2022   LABGLOB 2.1 09/06/2018   AGRATIO 2.0 09/06/2018   BILITOT 0.7 08/08/2022   ALKPHOS 46 08/08/2022   AST 23 08/08/2022   ALT 17 08/08/2022   ANIONGAP 9 08/10/2022    CBG (last 3)  No results for input(s): "GLUCAP" in the last 72 hours.    Coagulation Profile: Recent Labs  Lab 08/07/22 1201  INR 1.0     Radiology Studies: No results found.     Hosie Poisson M.D. [facility] 08/10/2022, 11:37 AM  Available via Epic  secure chat 7am-7pm After 7 pm, please refer to night coverage provider listed on amion.

## 2022-08-10 NOTE — Anesthesia Postprocedure Evaluation (Signed)
Anesthesia Post Note  Patient: Leslie Dixon  Procedure(s) Performed: INTRAMEDULLARY (IM) NAIL INTERTROCHANTERIC (Right: Hip)     Patient location during evaluation: PACU Anesthesia Type: General Level of consciousness: awake and patient cooperative Pain management: pain level controlled Vital Signs Assessment: post-procedure vital signs reviewed and stable Respiratory status: spontaneous breathing, nonlabored ventilation and respiratory function stable Cardiovascular status: blood pressure returned to baseline and stable Postop Assessment: no apparent nausea or vomiting Anesthetic complications: no   No notable events documented.  Last Vitals:  Vitals:   08/10/22 0517 08/10/22 1344  BP: (!) 114/52 (!) 120/55  Pulse: 78 92  Resp: 16 18  Temp: 37 C 36.8 C  SpO2: 94% 97%    Last Pain:  Vitals:   08/10/22 1629  TempSrc:   PainSc: 3                  Sirena Riddle

## 2022-08-10 NOTE — Progress Notes (Signed)
Physical Therapy Treatment Patient Details Name: Leslie Dixon MRN: [id number] DOB: Dec 14, 1945 Today's Date: 08/10/2022   History of Present Illness Patient is a 77 year old female who presented after a fall on 1/13 with increased pain in RLE. Patient was found to have a right intertrochanteric femur fracture. Patient underwent a intramedullary fixation on 1/14. PMH: L4-5 surgery, DDD, asthma.    PT Comments    POD # 2 am session General Comments: AxO x 3 very pleasant Lady Motivated.  Stated she fell while carrying a handful of laundry when she tripped. Pt already OOB in recliner.  General transfer comment: 50% VC's on proper hand placement and safety with turns.  Also assisted with a toilet transfer.  Unsteady. General Gait Details: 25% VC's on proper walker to self distance and 50% VC's on safety with turns.  No dizziness this session.  BP's maintaining.  Soft but maintaining 106/92 with HR 102 after amb 35 feet. Then returned to room to perform some TE's following HEP handout.  Instructed on proper tech, freq as well as use of ICE.   Will see pt again this afternoon for stair training.   Recommendations for follow up therapy are one component of a multi-disciplinary discharge planning process, led by the attending physician.  Recommendations may be updated based on patient status, additional functional criteria and insurance authorization.  Follow Up Recommendations  Home health PT     Assistance Recommended at Discharge Intermittent Supervision/Assistance  Patient can return home with the following A little help with walking and/or transfers;A little help with bathing/dressing/bathroom;Assist for transportation;Help with stairs or ramp for entrance;Assistance with cooking/housework   Equipment Recommendations  Rolling walker (2 wheels);BSC/3in1    Recommendations for Other Services       Precautions / Restrictions Precautions Precautions: Fall Restrictions Weight Bearing  Restrictions: No RLE Weight Bearing: Weight bearing as tolerated     Mobility  Bed Mobility                    Transfers Overall transfer level: Needs assistance Equipment used: Rolling walker (2 wheels) Transfers: Sit to/from Stand Sit to Stand: Min assist, Min guard           General transfer comment: 50% VC's on proper hand placement and safety with turns.  Also assisted with a toilet transfer.  Unsteady.    Ambulation/Gait Ambulation/Gait assistance: Min guard, Min assist Gait Distance (Feet): 35 Feet Assistive device: Rolling walker (2 wheels) Gait Pattern/deviations: Step-to pattern, Decreased stance time - right Gait velocity: decreased     General Gait Details: 25% VC's on proper walker to self distance and 50% VC's on safety with turns.  No dizziness this session.  BP's maintaining.  Soft but maintaining 106/92 with HR 102 after amb 35 feet.   Stairs             Wheelchair Mobility    Modified Rankin (Stroke Patients Only)       Balance                                            Cognition Arousal/Alertness: Awake/alert Behavior During Therapy: WFL for tasks assessed/performed Overall Cognitive Status: Within Functional Limits for tasks assessed  General Comments: AxO x 3 very pleasant Lady Motivated.  Stated she fell while carrying a handful of laundry when she tripped.        Exercises  10 reps AP 10 reps knee presses 05 reps HS 05 reps ABD/ADd     General Comments        Pertinent Vitals/Pain Pain Assessment Pain Assessment: 0-10 Pain Score: 6  Pain Location: RLE, thigh Pain Descriptors / Indicators: Discomfort, Sore, Tightness, Operative site guarding Pain Intervention(s): Monitored during session, Premedicated before session, Repositioned, Ice applied    Home Living                          Prior Function            PT Goals (current  goals can now be found in the care plan section) Progress towards PT goals: Progressing toward goals    Frequency    Min 3X/week      PT Plan Current plan remains appropriate    Co-evaluation              AM-PAC PT "6 Clicks" Mobility   Outcome Measure  Help needed turning from your back to your side while in a flat bed without using bedrails?: A Little Help needed moving from lying on your back to sitting on the side of a flat bed without using bedrails?: A Little Help needed moving to and from a bed to a chair (including a wheelchair)?: A Little Help needed standing up from a chair using your arms (e.g., wheelchair or bedside chair)?: A Little Help needed to walk in hospital room?: A Little Help needed climbing 3-5 steps with a railing? : A Little 6 Click Score: 18    End of Session Equipment Utilized During Treatment: Gait belt Activity Tolerance: Patient tolerated treatment well Patient left: in chair;with call bell/phone within reach;with chair alarm set Nurse Communication: Mobility status PT Visit Diagnosis: Other abnormalities of gait and mobility (R26.89);Difficulty in walking, not elsewhere classified (R26.2)     Time: 1015-1040 PT Time Calculation (min) (ACUTE ONLY): 25 min  Charges:  $Gait Training: 8-22 mins $Therapeutic Exercise: 8-22 mins                     Rica Koyanagi  PTA Acute  Rehabilitation Services Office M-F          [phone] Weekend pager [phone]

## 2022-08-10 NOTE — Progress Notes (Signed)
    Subjective:  Patient reports pain as mild to moderate.  Denies N/V/CP/SOB/Abd pain. She denies any tingling and numbness in LE bilaterally. Mild thigh pain as to be expected. She is wanting to go home.   Objective:   VITALS:   Vitals:   08/09/22 0905 08/09/22 1259 08/09/22 2204 08/10/22 0517  BP: 94/69 (!) 110/49 (!) 113/56 (!) 114/52  Pulse: 88 82 86 78  Resp:  '18 16 16  '$ Temp:  98.4 F (36.9 C) 98.1 F (36.7 C) 98.6 F (37 C)  TempSrc:   Oral   SpO2:  94% 95% 94%  Weight:      Height:        Patient sitting up in bed. NAD.  Neurologically intact ABD soft Neurovascular intact Sensation intact distally Intact pulses distally Dorsiflexion/Plantar flexion intact Incision: dressing C/D/I No cellulitis present Compartment soft   Lab Results  Component Value Date   WBC 6.1 08/10/2022   HGB 11.3 (L) 08/10/2022   HCT 34.5 (L) 08/10/2022   MCV 93.0 08/10/2022   PLT 123 (L) 08/10/2022   BMET    Component Value Date/Time   NA 135 08/10/2022 0326   NA 140 12/07/2019 0900   NA 140 01/01/2014 1009   K 3.4 (L) 08/10/2022 0326   K 4.3 01/01/2014 1009   CL 105 08/10/2022 0326   CL 107 01/09/2013 1029   CO2 21 (L) 08/10/2022 0326   CO2 24 09/21/2016 0913   CO2 25 01/01/2014 1009   GLUCOSE 108 (H) 08/10/2022 0326   GLUCOSE 76 01/01/2014 1009   GLUCOSE 88 01/09/2013 1029   BUN 23 08/10/2022 0326   BUN 21 12/07/2019 0900   BUN 20 09/21/2016 0913   BUN 16.7 01/01/2014 1009   CREATININE 0.68 08/10/2022 0326   CREATININE 0.67 09/21/2016 0913   CREATININE 0.76 02/20/2016 0758   CREATININE 0.8 01/01/2014 1009   CALCIUM 8.0 (L) 08/10/2022 0326   CALCIUM 9.5 09/21/2016 0913   CALCIUM 9.0 01/14/2016 0000   CALCIUM 9.1 01/01/2014 1009   GFRNONAA >60 08/10/2022 0326   GFRNONAA 87 09/21/2016 0913   GFRNONAA 79 02/20/2016 0758     Assessment/Plan: 2 Days Post-Op   Principal Problem:   Closed right hip fracture, initial encounter ([place]) Active Problems:   GERD  (gastroesophageal reflux disease)   Osteoporosis   Environmental and seasonal allergies   Mild intermittent asthma without complication   WBAT with walker DVT ppx: Aspirin, SCDs, TEDS PO pain control PT/OT: Patient had some orthostatic hypotension with PT yesterday. Continue PT today.  Dispo: Patient under care of the medical team disposition per their recommendation. Patient is wanting to go home with her husband. Rolling walker ordered. Pain medication and DVT ppx ordered.    Charlott Rakes, PA-C 08/10/2022, 8:00 AM   [facility]  [facility] [address], [address], [place], [place] [address] Phone: [phone] [email or web address] Facebook  [employer]

## 2022-08-11 DIAGNOSIS — S72001A Fracture of unspecified part of neck of right femur, initial encounter for closed fracture: Secondary | ICD-10-CM | POA: Diagnosis not present

## 2022-08-11 LAB — BASIC METABOLIC PANEL
Anion gap: 10 (ref 5–15)
BUN: 25 mg/dL — ABNORMAL HIGH (ref 8–23)
CO2: 20 mmol/L — ABNORMAL LOW (ref 22–32)
Calcium: 7.9 mg/dL — ABNORMAL LOW (ref 8.9–10.3)
Chloride: 106 mmol/L (ref 98–111)
Creatinine, Ser: 0.65 mg/dL (ref 0.44–1.00)
GFR, Estimated: >60 mL/min (ref >60)
Glucose, Bld: 109 mg/dL — ABNORMAL HIGH (ref 70–99)
Potassium: 3.4 mmol/L — ABNORMAL LOW (ref 3.5–5.1)
Sodium: 136 mmol/L (ref 135–145)

## 2022-08-11 LAB — CBC
HCT: 34 % — ABNORMAL LOW (ref 36.0–46.0)
Hemoglobin: 11 g/dL — ABNORMAL LOW (ref 12.0–15.0)
MCH: 30.5 pg (ref 26.0–34.0)
MCHC: 32.4 g/dL (ref 30.0–36.0)
MCV: 94.2 fL (ref 80.0–100.0)
Platelets: 134 10*3/uL — ABNORMAL LOW (ref 150–400)
RBC: 3.61 MIL/uL — ABNORMAL LOW (ref 3.87–5.11)
RDW: 16.2 % — ABNORMAL HIGH (ref 11.5–15.5)
WBC: 4.7 10*3/uL (ref 4.0–10.5)
nRBC: 0 % (ref 0.0–0.2)

## 2022-08-11 MED ORDER — METHOCARBAMOL 500 MG PO TABS: 500.0000 mg | ORAL_TABLET | Freq: Four times a day (QID) | ORAL | 0 refills | Status: AC | PRN

## 2022-08-11 MED ORDER — ASPIRIN 81 MG PO TBEC: 81.0000 mg | DELAYED_RELEASE_TABLET | Freq: Two times a day (BID) | ORAL | 0 refills | Status: AC

## 2022-08-11 MED ORDER — POTASSIUM CHLORIDE CRYS ER 20 MEQ PO TBCR
40.0000 meq | EXTENDED_RELEASE_TABLET | Freq: Once | ORAL | Status: AC
  Administered 2022-08-11: 40 meq via ORAL
  Filled 2022-08-11: qty 2

## 2022-08-11 NOTE — Plan of Care (Signed)
  Problem: Clinical Measurements: Goal: Ability to maintain clinical measurements within normal limits will improve Outcome: Progressing   Problem: Activity: Goal: Risk for activity intolerance will decrease Outcome: Progressing   Problem: Coping: Goal: Level of anxiety will decrease Outcome: Progressing   Problem: Pain Managment: Goal: General experience of comfort will improve Outcome: Progressing

## 2022-08-11 NOTE — Discharge Summary (Signed)
Discharge Summary  Leslie Dixon WUX:[id number] DOB: 10-22-1945  PCP: Janie Morning, DO  Admit date: 08/07/2022 Discharge date: 08/11/2022  Time spent: 87mns, more than 50% time spent on coordination of care.   Recommendations for Outpatient Follow-up:  F/u with PCP within a week  for hospital discharge follow up, repeat cbc/bmp at follow up F/u with ortho in two weeks Home health/DME arranged     Discharge Diagnoses:  Active Hospital Problems   Diagnosis Date Noted   Closed right hip fracture, initial encounter ([place] 08/07/2022   Mild intermittent asthma without complication 125/36/6440  Environmental and seasonal allergies 04/20/2016   GERD (gastroesophageal reflux disease)    Osteoporosis     Resolved Hospital Problems  No resolved problems to display.    Discharge Condition: stable  Diet recommendation: regular diet   Filed Weights   08/07/22 1134  Weight: 60.3 kg    History of present illness: ( per admitting MD Dr OOlevia Bowens HPI: DNIASHA DEVINSis a 77y.o. female with medical history significant of seasonal allergies, osteoarthritis, asthma, chronic back pain, right sciatica, fibromyalgia right foot drop, GERD, horseshoe kidney, nephrolithiasis cyst, leukopenia, migraine headaches, osteoporosis, sinusitis, vertigo who had a mechanical fall at home injuring her right hip and pelvic area.  No prodromal symptoms.  No fever, chills or night sweats. No sore throat, rhinorrhea, dyspnea, wheezing or hemoptysis.  No chest pain, palpitations, diaphoresis, PND, orthopnea or pitting edema of the lower extremities.  No appetite changes, abdominal pain, diarrhea, constipation, melena or hematochezia.  No flank pain, dysuria, frequency or hematuria.  No polyuria, polydipsia, polyphagia or blurred vision.   ED course: Initial vital signs were temperature 98.1 F, pulse 67, respiration 18, BP 134/61 mmHg O2 sat 98% on room air.  The patient received fentanyl 50 mcg IVP x 1.   Lab  work: CBC 0 white count 7.1, hemoglobin 13.7 g/dL platelets 155.  Normal PT and INR.  CMP showed a CO2 of 20 mmol/L.  Glucose 100 and calcium 8.8 mg/dL.  The rest of the BMP measurements were normal.   Imaging: Right hip x-ray showed displaced intertrochanteric right proximal femur fracture.  Portable 1 view chest radiograph with mild chronic bronchitic change, there are no acute findings or evidence of traumatic injury.  Hospital Course:  Principal Problem:   Closed right hip fracture, initial encounter ([place] Active Problems:   GERD (gastroesophageal reflux disease)   Osteoporosis   Environmental and seasonal allergies   Mild intermittent asthma without complication   Assessment and Plan:   Right hip fracture secondary to mechanical fall Orthopedics consulted and she underwent intramedullary fixation of the right femur.  Therapy evaluations recommended home with home health WBAT with walker per ortho recommendation F/u with ortho in two weeks .     Hypotension with therapy on 1/15 Improved, encourage oral intake Patient reports that she did not feel dizzy today Blood pressure improved    GERD Continue with PPI     Mild intermittent asthma Continue with bronchodilators as needed  no wheezing, lung clears     Hypokalemia Likely due to poor oral intake, Replaced , encourage oral intake F/u with pcp , repeat basic labs at follow up       Malnutrition Type:  Nutrition Problem: Increased nutrient needs Etiology: acute illness (R hip fracture)     Nutrition Interventions:  Interventions: Ensure Enlive (each supplement provides 350kcal and 20 grams of protein), Refer to RD note for recommendations, MVI  Estimated body mass index is 21.47 kg/m as calculated from the following:   Height as of this encounter: '5\' 6"'$  (1.676 m).   Weight as of this encounter: 60.3 kg.    Discharge Exam: BP 121/69 (BP Location: Right Arm)   Pulse 89   Temp 98 F (36.7 C) (Oral)    Resp 19   Ht '5\' 6"'$  (1.676 m)   Wt 60.3 kg   SpO2 97%   BMI 21.47 kg/m   General: NAD Cardiovascular: RRR Respiratory: normal respiratory effort    Discharge Instructions     Diet general   Complete by: As directed    Increase activity slowly   Complete by: As directed       Allergies as of 08/11/2022       Reactions   Peanut Oil Anaphylaxis   Singulair [montelukast]    Facial swelling   Aspartame And Phenylalanine    Clarithromycin    REACTION: GI   Codeine Other (See Comments)   REACTION: nausea and vomiting   Lactose    Other reaction(s): GI Upset (intolerance)   Meloxicam Other (See Comments)   Face swelling   Other    GLUTEN RESTRICTED LACTOSE INTOLERANT ALLERGIC TO NUTS AND CORN   Penicillins    REACTION: per allergy testing/Patient states she has taken Augmentin without complications.    Pregabalin    REACTION: headache, several side effects   Sulfa Antibiotics Other (See Comments)   REACTION: Hives, wheezing   Sulfonamide Derivatives    REACTION: Hives, wheezing   Cefdinir Rash   Glycerin Other (See Comments)   Migraines, eye problems        Medication List     STOP taking these medications    famotidine 20 MG tablet Commonly known as: PEPCID       TAKE these medications    albuterol 108 (90 Base) MCG/ACT inhaler Commonly known as: VENTOLIN HFA Inhale 2 puffs into the lungs every 6 (six) hours as needed for wheezing or shortness of breath.   Arnuity Ellipta 100 MCG/ACT Aepb Generic drug: Fluticasone Furoate USE 1 INHALATION DAILY   aspirin EC 81 MG tablet Take 1 tablet (81 mg total) by mouth 2 (two) times daily. Swallow whole.   cyanocobalamin 1000 MCG tablet Commonly known as: VITAMIN B12 Take 1,000 mcg by mouth once a week.   loratadine 10 MG tablet Commonly known as: CLARITIN Take 10 mg by mouth daily.   methocarbamol 500 MG tablet Commonly known as: ROBAXIN Take 1 tablet (500 mg total) by mouth every 6 (six) hours as  needed for muscle spasms.   methocarbamol 500 MG tablet Commonly known as: ROBAXIN Take 1 tablet (500 mg total) by mouth every 6 (six) hours as needed for muscle spasms.   omeprazole 20 MG capsule Commonly known as: PRILOSEC Take 1 capsule (20 mg total) by mouth daily.   traMADol 50 MG tablet Commonly known as: Ultram Take 1 tablet (50 mg total) by mouth every 6 (six) hours as needed for moderate pain or severe pain.   triamcinolone 55 MCG/ACT Aero nasal inhaler Commonly known as: NASACORT Place 2 sprays into the nose daily as needed (allergies).   TURMERIC PO Take 2,000 Units by mouth daily at 8 pm.   Vitamin D-3 125 MCG (5000 UT) Tabs Take 1 tablet by mouth daily.   vitamin E 180 MG (400 UNITS) capsule Take 400 Units by mouth in the morning and at bedtime.  Durable Medical Equipment  (From admission, onward)           Start     Ordered   08/11/22 1027  For home use only DME 3 n 1  Once        08/11/22 1026   08/10/22 1140  For home use only DME Walker rolling  Once       Question Answer Comment  Walker: With [place]   Patient needs a walker to treat with the following condition Hip fracture ([place])      08/10/22 1139   08/09/22 0801  For home use only DME Walker rolling  Once       Question Answer Comment  Walker: With [place]   Patient needs a walker to treat with the following condition Closed comminuted intertrochanteric fracture of proximal end of right femur ([place])      08/09/22 0801           Allergies  Allergen Reactions   Peanut Oil Anaphylaxis   Singulair [Montelukast]     Facial swelling   Aspartame And Phenylalanine    Clarithromycin     REACTION: GI   Codeine Other (See Comments)    REACTION: nausea and vomiting   Lactose     Other reaction(s): GI Upset (intolerance)   Meloxicam Other (See Comments)    Face swelling   Other     GLUTEN RESTRICTED LACTOSE INTOLERANT ALLERGIC TO NUTS AND CORN    Penicillins     REACTION: per allergy testing/Patient states she has taken Augmentin without complications.    Pregabalin     REACTION: headache, several side effects   Sulfa Antibiotics Other (See Comments)    REACTION: Hives, wheezing   Sulfonamide Derivatives     REACTION: Hives, wheezing   Cefdinir Rash   Glycerin Other (See Comments)    Migraines, eye problems    Follow-up Information     Charlott Rakes, PA-C. Schedule an appointment as soon as possible for a visit in 2 week(s).   Specialty: Orthopedic Surgery Why: For wound re-check, For suture removal Contact information: [address], [address] [place] [address] [phone]         Care, [facility] Follow up.   Specialty: Home Health Services Why: Alvis Lemmings will provide PT and OT in the home after discharge. Contact information: [place] [address] [place] [address] [phone]         Janie Morning, DO Follow up in 1 week(s).   Specialty: Family Medicine Why: hospital discharge follow up, repeat cbc/bmp Contact information: [address] [place] [address] [phone]                  The results of significant diagnostics from this hospitalization (including imaging, microbiology, ancillary and laboratory) are listed below for reference.    Significant Diagnostic Studies: Pelvis Portable  Result Date: 08/08/2022 CLINICAL DATA:  Closed comminuted inter trochanteric fracture of proximal right femur. Status post IM nail. EXAM: PORTABLE PELVIS 1-2 VIEWS COMPARISON:  Preoperative imaging. FINDINGS: Intramedullary nail with trans trochanteric and distal locking screw fixation traverse intertrochanteric femur fracture. Improved fracture alignment from preoperative imaging. Recent postsurgical change includes air and edema in the soft tissues with lateral skin staples. IMPRESSION: ORIF of right intertrochanteric femur fracture. No immediate postoperative  complication. Electronically Signed   By: Keith Rake M.D.   On: 08/08/2022 11:00   DG FEMUR, MIN 2 VIEWS RIGHT  Result Date: 08/08/2022 CLINICAL DATA:  Elective surgery. EXAM: RIGHT FEMUR 2 VIEWS COMPARISON:  Preoperative imaging. FINDINGS: Four fluoroscopic spot views of the right proximal femur obtained in the operating room. Interval placement of intramedullary nail with distal locking and trans trochanteric screw. Fluoroscopy time 25 seconds. Dose 3.7 mGy. IMPRESSION: Intraoperative fluoroscopy during right femur ORIF. Electronically Signed   By: Keith Rake M.D.   On: 08/08/2022 10:13   DG C-Arm 1-60 Min-No Report  Result Date: 08/08/2022 Fluoroscopy was utilized by the requesting physician.  No radiographic interpretation.   DG Knee Right Port  Result Date: 08/07/2022 CLINICAL DATA:  Hip fracture. EXAM: PORTABLE RIGHT KNEE - 1-2 VIEW COMPARISON:  None available FINDINGS: No acute bony abnormality. Specifically, no fracture, subluxation, or dislocation. Joint space narrowing and early spurring. No joint effusion. IMPRESSION: No acute bony abnormality. Electronically Signed   By: Rolm Baptise M.D.   On: 08/07/2022 17:13   DG Hip Unilat With Pelvis 2-3 Views Right  Result Date: 08/07/2022 CLINICAL DATA:  Pain after fall.  Injured right side. EXAM: DG HIP (WITH OR WITHOUT PELVIS) 2-3V RIGHT COMPARISON:  None Available. FINDINGS: Displaced intertrochanteric right proximal femur fracture with involvement of the greater and lesser trochanters. There is mild associated angulation. Femoral head remains seated. Bony pelvis and pubic rami are intact. Overlying artifact. IMPRESSION: Displaced intertrochanteric right proximal femur fracture. Electronically Signed   By: Keith Rake M.D.   On: 08/07/2022 13:05   DG Chest 1 View  Result Date: 08/07/2022 CLINICAL DATA:  Pain after fall.  Injured right side. EXAM: CHEST  1 VIEW COMPARISON:  Radiograph 07/27/2018 FINDINGS: Heart is normal in  size for technique. Normal mediastinal contours. Mild chronic bronchitic change without acute airspace disease. Chronic biapical pleuroparenchymal scarring. No pneumothorax or large pleural effusion. On limited assessment, no acute osseous finding. IMPRESSION: 1. No acute findings or evidence of traumatic injury. 2. Mild chronic bronchitic change. Electronically Signed   By: Keith Rake M.D.   On: 08/07/2022 13:04    Microbiology: Recent Results (from the past 240 hour(s))  Surgical pcr screen     Status: None   Collection Time: 08/07/22  5:08 PM   Specimen: Nasal Mucosa; Nasal Swab  Result Value Ref Range Status   MRSA, PCR NEGATIVE NEGATIVE Final   Staphylococcus aureus NEGATIVE NEGATIVE Final    Comment: (NOTE) The Xpert SA Assay (FDA approved for NASAL specimens in patients 20 years of age and older), is one component of a comprehensive surveillance program. It is not intended to diagnose infection nor to guide or monitor treatment. Performed at [facility], [place] [address] [place], [place] [address]      Labs: Basic Metabolic Panel: Recent Labs  Lab 08/07/22 1201 08/08/22 0323 08/09/22 0330 08/10/22 0326 08/11/22 0315  NA 138 136 138 135 136  K 3.5 3.2* 4.1 3.4* 3.4*  CL 108 110 110 105 106  CO2 20* 20* 23 21* 20*  GLUCOSE 100* 130* 111* 108* 109*  BUN '20 17 16 23 '$ 25*  CREATININE 0.73 0.60 0.61 0.68 0.65  CALCIUM 8.8* 7.8* 8.1* 8.0* 7.9*  MG  --   --  2.4  --   --   PHOS  --   --  2.9  --   --    Liver Function Tests: Recent Labs  Lab 08/08/22 0323 08/09/22 0330  AST 23  --   ALT 17  --   ALKPHOS 46  --   BILITOT 0.7  --  PROT 5.8*  --   ALBUMIN 3.5 3.4*   No results for input(s): "LIPASE", "AMYLASE" in the last 168 hours. No results for input(s): "AMMONIA" in the last 168 hours. CBC: Recent Labs  Lab 08/07/22 1201 08/08/22 0323 08/09/22 0318 08/10/22 0326 08/11/22 0315  WBC 7.1 5.9 6.9 6.1 4.7  NEUTROABS 5.4  --   --   --   --   HGB  13.7 12.6 12.2 11.3* 11.0*  HCT 41.2 37.8 38.0 34.5* 34.0*  MCV 91.8 91.5 93.8 93.0 94.2  PLT 155 142* 131* 123* 134*   Cardiac Enzymes: No results for input(s): "CKTOTAL", "CKMB", "CKMBINDEX", "TROPONINI" in the last 168 hours. BNP: BNP (last 3 results) No results for input(s): "BNP" in the last 8760 hours.  ProBNP (last 3 results) No results for input(s): "PROBNP" in the last 8760 hours.  CBG: No results for input(s): "GLUCAP" in the last 168 hours.  FURTHER DISCHARGE INSTRUCTIONS:   Get Medicines reviewed and adjusted: Please take all your medications with you for your next visit with your Primary MD   Laboratory/radiological data: Please request your Primary MD to go over all hospital tests and procedure/radiological results at the follow up, please ask your Primary MD to get all Hospital records sent to his/her office.   In some cases, they will be blood work, cultures and biopsy results pending at the time of your discharge. Please request that your primary care M.D. goes through all the records of your hospital data and follows up on these results.   Also Note the following: If you experience worsening of your admission symptoms, develop shortness of breath, life threatening emergency, suicidal or homicidal thoughts you must seek medical attention immediately by calling 911 or calling your MD immediately  if symptoms less severe.   You must read complete instructions/literature along with all the possible adverse reactions/side effects for all the Medicines you take and that have been prescribed to you. Take any new Medicines after you have completely understood and accpet all the possible adverse reactions/side effects.    Do not drive when taking Pain medications or sleeping medications (Benzodaizepines)   Do not take more than prescribed Pain, Sleep and Anxiety Medications. It is not advisable to combine anxiety,sleep and pain medications without talking with your primary  care practitioner   Special Instructions: If you have smoked or chewed Tobacco  in the last 2 yrs please stop smoking, stop any regular Alcohol  and or any Recreational drug use.   Wear Seat belts while driving.   Please note: You were cared for by a hospitalist during your hospital stay. Once you are discharged, your primary care physician will handle any further medical issues. Please note that NO REFILLS for any discharge medications will be authorized once you are discharged, as it is imperative that you return to your primary care physician (or establish a relationship with a primary care physician if you do not have one) for your post hospital discharge needs so that they can reassess your need for medications and monitor your lab values.     Signed:  Florencia Reasons MD, PhD, FACP  [facility] 08/11/2022, 10:27 AM

## 2022-08-11 NOTE — Progress Notes (Signed)
Patient discharge completed, awaiting for commode to be delivered to room.

## 2022-08-11 NOTE — TOC Progression Note (Signed)
Transition of Care [facility]) - Progression Note   Patient Details  Name: Leslie Dixon MRN: [id number] Date of Birth: 03-10-1946  Transition of [facility]) CM/SW [place], LCSW Phone Number: 08/11/2022, 12:34 PM  Clinical Narrative: TOC notified patient will now need a 3N1. Patient aware it will be private pay. CSW made DME referral to [facility] with [employer]. [employer] to deliver 3N1 to patient's room. TOC signing off.  Expected Discharge Plan and Services Expected Discharge Date: 08/11/22               DME Arranged: 3-N-1 DME Agency: [employer] Date DME Agency Contacted: 08/11/22 Representative spoke with at DME Agency: Brenton Grills HH Arranged: PT, OT [place] Agency: [place] Date [place]: 08/10/22 Time [place]: 1137 Representative spoke with at [place]: Cindie  Social Determinants of Health ([place]) Interventions SDOH Screenings   Food Insecurity: No Food Insecurity (08/07/2022)  Housing: Low Risk  (08/07/2022)  Transportation Needs: No Transportation Needs (08/07/2022)  Utilities: Not At Risk (08/07/2022)  Alcohol Screen: Low Risk  (10/11/2019)  Depression (PHQ2-9): Low Risk  (10/11/2019)  Financial Resource Strain: Low Risk  (10/11/2019)  Physical Activity: Sufficiently Active (10/11/2019)  Stress: No Stress Concern Present (10/11/2019)  Tobacco Use: Low Risk  (08/10/2022)   Readmission Risk Interventions     No data to display

## 2022-08-11 NOTE — Progress Notes (Signed)
Physical Therapy Treatment Patient Details Name: Leslie Dixon MRN: [id number] DOB: [birth year] Today's Date: 08/11/2022   History of Present Illness Patient is a 77 year old female who presented after a fall on 1/13 with increased pain in RLE. Patient was found to have a right intertrochanteric femur fracture. Patient underwent a intramedullary fixation on 1/14. PMH: L4-5 surgery, DDD, asthma.    PT Comments    POD # 2 am session General Comments: AxO x 3 very pleasant Lady Motivated.  Stated she fell while carrying a handful of laundry when she tripped. "I won't do that again". MD in room during session Orthostatic BP's Reclined         BP 108/59  HR 79 Standing         BP 113/67  HR 97 Amb 22 feet    BP 112/58  HR 102 Educated and reviewed HEP handout.  Addressed all mobility questions, discussed appropriate activity, educated on use of ICE.  Pt ready for D/C to home.  Waiting on 3:1 to be delivered.    Recommendations for follow up therapy are one component of a multi-disciplinary discharge planning process, led by the attending physician.  Recommendations may be updated based on patient status, additional functional criteria and insurance authorization.  Follow Up Recommendations  Home health PT     Assistance Recommended at Discharge Intermittent Supervision/Assistance  Patient can return home with the following A little help with walking and/or transfers;A little help with bathing/dressing/bathroom;Assist for transportation;Help with stairs or ramp for entrance;Assistance with cooking/housework   Equipment Recommendations  Rolling walker (2 wheels);BSC/3in1    Recommendations for Other Services       Precautions / Restrictions Precautions Precautions: Fall Restrictions Weight Bearing Restrictions: No RLE Weight Bearing: Weight bearing as tolerated     Mobility  Bed Mobility               General bed mobility comments: OOB in recliner    Transfers Overall  transfer level: Needs assistance Equipment used: Rolling walker (2 wheels) Transfers: Sit to/from Stand Sit to Stand: Min guard, Supervision           General transfer comment: 25% VC's pn proper hand placement and safety with turns.    Ambulation/Gait Ambulation/Gait assistance: Min guard, Supervision Gait Distance (Feet): 22 Feet Assistive device: Rolling walker (2 wheels) Gait Pattern/deviations: Step-to pattern, Decreased stance time - right Gait velocity: decreased     General Gait Details: amb a functional distance with 25% VC's on safety with turns   Stairs             Wheelchair Mobility    Modified Rankin (Stroke Patients Only)       Balance                                            Cognition Arousal/Alertness: Awake/alert Behavior During Therapy: WFL for tasks assessed/performed Overall Cognitive Status: Within Functional Limits for tasks assessed                                 General Comments: AxO x 3 very pleasant Lady Motivated.  Stated she fell while carrying a handful of laundry when she tripped. "I won't do that again".        Exercises      General  Comments        Pertinent Vitals/Pain Pain Assessment Pain Assessment: 0-10 Pain Score: 4  Pain Location: R hip Pain Descriptors / Indicators: Discomfort, Sore, Tightness, Operative site guarding Pain Intervention(s): Monitored during session, Premedicated before session, Repositioned, Ice applied    Home Living                          Prior Function            PT Goals (current goals can now be found in the care plan section) Progress towards PT goals: Progressing toward goals    Frequency    Min 3X/week      PT Plan Current plan remains appropriate    Co-evaluation              AM-PAC PT "6 Clicks" Mobility   Outcome Measure  Help needed turning from your back to your side while in a flat bed without using  bedrails?: A Little Help needed moving from lying on your back to sitting on the side of a flat bed without using bedrails?: A Little Help needed moving to and from a bed to a chair (including a wheelchair)?: A Little Help needed standing up from a chair using your arms (e.g., wheelchair or bedside chair)?: A Little Help needed to walk in hospital room?: A Little Help needed climbing 3-5 steps with a railing? : A Little 6 Click Score: 18    End of Session Equipment Utilized During Treatment: Gait belt Activity Tolerance: Patient tolerated treatment well Patient left: in chair;with call bell/phone within reach;with chair alarm set Nurse Communication: Mobility status PT Visit Diagnosis: Other abnormalities of gait and mobility (R26.89);Difficulty in walking, not elsewhere classified (R26.2)     Time: 1020-1047 PT Time Calculation (min) (ACUTE ONLY): 27 min  Charges:  $Gait Training: 8-22 mins $Therapeutic Activity: 8-22 mins                     Rica Koyanagi  PTA Acute  Rehabilitation Services Office M-F          [phone] Weekend pager [phone]

## 2022-08-11 NOTE — Plan of Care (Signed)
  Problem: Activity: ?Goal: Risk for activity intolerance will decrease ?Outcome: Progressing ?  ?Problem: Pain Managment: ?Goal: General experience of comfort will improve ?Outcome: Progressing ?  ?Problem: Safety: ?Goal: Ability to remain free from injury will improve ?Outcome: Progressing ?  ?

## 2022-08-13 DIAGNOSIS — Z4889 Encounter for other specified surgical aftercare: Secondary | ICD-10-CM | POA: Diagnosis not present

## 2022-08-13 DIAGNOSIS — M80051D Age-related osteoporosis with current pathological fracture, right femur, subsequent encounter for fracture with routine healing: Secondary | ICD-10-CM | POA: Diagnosis not present

## 2022-08-13 DIAGNOSIS — Z87442 Personal history of urinary calculi: Secondary | ICD-10-CM | POA: Diagnosis not present

## 2022-08-13 DIAGNOSIS — G43909 Migraine, unspecified, not intractable, without status migrainosus: Secondary | ICD-10-CM | POA: Diagnosis not present

## 2022-08-13 DIAGNOSIS — M1990 Unspecified osteoarthritis, unspecified site: Secondary | ICD-10-CM | POA: Diagnosis not present

## 2022-08-13 DIAGNOSIS — M5441 Lumbago with sciatica, right side: Secondary | ICD-10-CM | POA: Diagnosis not present

## 2022-08-13 DIAGNOSIS — M21371 Foot drop, right foot: Secondary | ICD-10-CM | POA: Diagnosis not present

## 2022-08-13 DIAGNOSIS — K219 Gastro-esophageal reflux disease without esophagitis: Secondary | ICD-10-CM | POA: Diagnosis not present

## 2022-08-13 DIAGNOSIS — Z7951 Long term (current) use of inhaled steroids: Secondary | ICD-10-CM | POA: Diagnosis not present

## 2022-08-13 DIAGNOSIS — J4520 Mild intermittent asthma, uncomplicated: Secondary | ICD-10-CM | POA: Diagnosis not present

## 2022-08-13 DIAGNOSIS — Z7982 Long term (current) use of aspirin: Secondary | ICD-10-CM | POA: Diagnosis not present

## 2022-08-13 DIAGNOSIS — I959 Hypotension, unspecified: Secondary | ICD-10-CM | POA: Diagnosis not present

## 2022-08-13 DIAGNOSIS — E876 Hypokalemia: Secondary | ICD-10-CM | POA: Diagnosis not present

## 2022-08-13 DIAGNOSIS — Z79899 Other long term (current) drug therapy: Secondary | ICD-10-CM | POA: Diagnosis not present

## 2022-08-13 DIAGNOSIS — Z9181 History of falling: Secondary | ICD-10-CM | POA: Diagnosis not present

## 2022-08-13 DIAGNOSIS — M797 Fibromyalgia: Secondary | ICD-10-CM | POA: Diagnosis not present

## 2022-08-13 DIAGNOSIS — D72819 Decreased white blood cell count, unspecified: Secondary | ICD-10-CM | POA: Diagnosis not present

## 2022-08-13 DIAGNOSIS — M519 Unspecified thoracic, thoracolumbar and lumbosacral intervertebral disc disorder: Secondary | ICD-10-CM | POA: Diagnosis not present

## 2022-08-13 DIAGNOSIS — J4489 Other specified chronic obstructive pulmonary disease: Secondary | ICD-10-CM | POA: Diagnosis not present

## 2022-08-13 DIAGNOSIS — G8929 Other chronic pain: Secondary | ICD-10-CM | POA: Diagnosis not present

## 2022-08-16 DIAGNOSIS — M80051D Age-related osteoporosis with current pathological fracture, right femur, subsequent encounter for fracture with routine healing: Secondary | ICD-10-CM | POA: Diagnosis not present

## 2022-08-16 DIAGNOSIS — J4520 Mild intermittent asthma, uncomplicated: Secondary | ICD-10-CM | POA: Diagnosis not present

## 2022-08-16 DIAGNOSIS — G43909 Migraine, unspecified, not intractable, without status migrainosus: Secondary | ICD-10-CM | POA: Diagnosis not present

## 2022-08-16 DIAGNOSIS — M519 Unspecified thoracic, thoracolumbar and lumbosacral intervertebral disc disorder: Secondary | ICD-10-CM | POA: Diagnosis not present

## 2022-08-16 DIAGNOSIS — D72819 Decreased white blood cell count, unspecified: Secondary | ICD-10-CM | POA: Diagnosis not present

## 2022-08-16 DIAGNOSIS — J4489 Other specified chronic obstructive pulmonary disease: Secondary | ICD-10-CM | POA: Diagnosis not present

## 2022-08-17 DIAGNOSIS — J4489 Other specified chronic obstructive pulmonary disease: Secondary | ICD-10-CM | POA: Diagnosis not present

## 2022-08-17 DIAGNOSIS — M519 Unspecified thoracic, thoracolumbar and lumbosacral intervertebral disc disorder: Secondary | ICD-10-CM | POA: Diagnosis not present

## 2022-08-17 DIAGNOSIS — E878 Other disorders of electrolyte and fluid balance, not elsewhere classified: Secondary | ICD-10-CM | POA: Diagnosis not present

## 2022-08-17 DIAGNOSIS — Z09 Encounter for follow-up examination after completed treatment for conditions other than malignant neoplasm: Secondary | ICD-10-CM | POA: Diagnosis not present

## 2022-08-17 DIAGNOSIS — S72009A Fracture of unspecified part of neck of unspecified femur, initial encounter for closed fracture: Secondary | ICD-10-CM | POA: Diagnosis not present

## 2022-08-17 DIAGNOSIS — G43909 Migraine, unspecified, not intractable, without status migrainosus: Secondary | ICD-10-CM | POA: Diagnosis not present

## 2022-08-17 DIAGNOSIS — J4520 Mild intermittent asthma, uncomplicated: Secondary | ICD-10-CM | POA: Diagnosis not present

## 2022-08-17 DIAGNOSIS — D72819 Decreased white blood cell count, unspecified: Secondary | ICD-10-CM | POA: Diagnosis not present

## 2022-08-17 DIAGNOSIS — M80051D Age-related osteoporosis with current pathological fracture, right femur, subsequent encounter for fracture with routine healing: Secondary | ICD-10-CM | POA: Diagnosis not present

## 2022-08-19 DIAGNOSIS — M80051D Age-related osteoporosis with current pathological fracture, right femur, subsequent encounter for fracture with routine healing: Secondary | ICD-10-CM | POA: Diagnosis not present

## 2022-08-19 DIAGNOSIS — D72819 Decreased white blood cell count, unspecified: Secondary | ICD-10-CM | POA: Diagnosis not present

## 2022-08-19 DIAGNOSIS — G43909 Migraine, unspecified, not intractable, without status migrainosus: Secondary | ICD-10-CM | POA: Diagnosis not present

## 2022-08-19 DIAGNOSIS — J4520 Mild intermittent asthma, uncomplicated: Secondary | ICD-10-CM | POA: Diagnosis not present

## 2022-08-19 DIAGNOSIS — J4489 Other specified chronic obstructive pulmonary disease: Secondary | ICD-10-CM | POA: Diagnosis not present

## 2022-08-19 DIAGNOSIS — M519 Unspecified thoracic, thoracolumbar and lumbosacral intervertebral disc disorder: Secondary | ICD-10-CM | POA: Diagnosis not present

## 2022-08-20 DIAGNOSIS — J4489 Other specified chronic obstructive pulmonary disease: Secondary | ICD-10-CM | POA: Diagnosis not present

## 2022-08-20 DIAGNOSIS — J4520 Mild intermittent asthma, uncomplicated: Secondary | ICD-10-CM | POA: Diagnosis not present

## 2022-08-20 DIAGNOSIS — G43909 Migraine, unspecified, not intractable, without status migrainosus: Secondary | ICD-10-CM | POA: Diagnosis not present

## 2022-08-20 DIAGNOSIS — D72819 Decreased white blood cell count, unspecified: Secondary | ICD-10-CM | POA: Diagnosis not present

## 2022-08-20 DIAGNOSIS — M80051D Age-related osteoporosis with current pathological fracture, right femur, subsequent encounter for fracture with routine healing: Secondary | ICD-10-CM | POA: Diagnosis not present

## 2022-08-20 DIAGNOSIS — M519 Unspecified thoracic, thoracolumbar and lumbosacral intervertebral disc disorder: Secondary | ICD-10-CM | POA: Diagnosis not present

## 2022-08-23 DIAGNOSIS — S72141A Displaced intertrochanteric fracture of right femur, initial encounter for closed fracture: Secondary | ICD-10-CM | POA: Diagnosis not present

## 2022-08-23 DIAGNOSIS — S72141D Displaced intertrochanteric fracture of right femur, subsequent encounter for closed fracture with routine healing: Secondary | ICD-10-CM | POA: Diagnosis not present

## 2022-08-24 DIAGNOSIS — J4520 Mild intermittent asthma, uncomplicated: Secondary | ICD-10-CM | POA: Diagnosis not present

## 2022-08-24 DIAGNOSIS — J4489 Other specified chronic obstructive pulmonary disease: Secondary | ICD-10-CM | POA: Diagnosis not present

## 2022-08-24 DIAGNOSIS — D72819 Decreased white blood cell count, unspecified: Secondary | ICD-10-CM | POA: Diagnosis not present

## 2022-08-24 DIAGNOSIS — G43909 Migraine, unspecified, not intractable, without status migrainosus: Secondary | ICD-10-CM | POA: Diagnosis not present

## 2022-08-24 DIAGNOSIS — M80051D Age-related osteoporosis with current pathological fracture, right femur, subsequent encounter for fracture with routine healing: Secondary | ICD-10-CM | POA: Diagnosis not present

## 2022-08-24 DIAGNOSIS — M519 Unspecified thoracic, thoracolumbar and lumbosacral intervertebral disc disorder: Secondary | ICD-10-CM | POA: Diagnosis not present

## 2022-08-25 DIAGNOSIS — G43909 Migraine, unspecified, not intractable, without status migrainosus: Secondary | ICD-10-CM | POA: Diagnosis not present

## 2022-08-25 DIAGNOSIS — J4520 Mild intermittent asthma, uncomplicated: Secondary | ICD-10-CM | POA: Diagnosis not present

## 2022-08-25 DIAGNOSIS — D72819 Decreased white blood cell count, unspecified: Secondary | ICD-10-CM | POA: Diagnosis not present

## 2022-08-25 DIAGNOSIS — M80051D Age-related osteoporosis with current pathological fracture, right femur, subsequent encounter for fracture with routine healing: Secondary | ICD-10-CM | POA: Diagnosis not present

## 2022-08-25 DIAGNOSIS — J4489 Other specified chronic obstructive pulmonary disease: Secondary | ICD-10-CM | POA: Diagnosis not present

## 2022-08-25 DIAGNOSIS — M519 Unspecified thoracic, thoracolumbar and lumbosacral intervertebral disc disorder: Secondary | ICD-10-CM | POA: Diagnosis not present

## 2022-08-26 DIAGNOSIS — J4520 Mild intermittent asthma, uncomplicated: Secondary | ICD-10-CM | POA: Diagnosis not present

## 2022-08-26 DIAGNOSIS — J4489 Other specified chronic obstructive pulmonary disease: Secondary | ICD-10-CM | POA: Diagnosis not present

## 2022-08-26 DIAGNOSIS — D72819 Decreased white blood cell count, unspecified: Secondary | ICD-10-CM | POA: Diagnosis not present

## 2022-08-26 DIAGNOSIS — G43909 Migraine, unspecified, not intractable, without status migrainosus: Secondary | ICD-10-CM | POA: Diagnosis not present

## 2022-08-26 DIAGNOSIS — M80051D Age-related osteoporosis with current pathological fracture, right femur, subsequent encounter for fracture with routine healing: Secondary | ICD-10-CM | POA: Diagnosis not present

## 2022-08-26 DIAGNOSIS — M519 Unspecified thoracic, thoracolumbar and lumbosacral intervertebral disc disorder: Secondary | ICD-10-CM | POA: Diagnosis not present

## 2022-08-30 DIAGNOSIS — D72819 Decreased white blood cell count, unspecified: Secondary | ICD-10-CM | POA: Diagnosis not present

## 2022-08-30 DIAGNOSIS — J4489 Other specified chronic obstructive pulmonary disease: Secondary | ICD-10-CM | POA: Diagnosis not present

## 2022-08-30 DIAGNOSIS — M519 Unspecified thoracic, thoracolumbar and lumbosacral intervertebral disc disorder: Secondary | ICD-10-CM | POA: Diagnosis not present

## 2022-08-30 DIAGNOSIS — J4520 Mild intermittent asthma, uncomplicated: Secondary | ICD-10-CM | POA: Diagnosis not present

## 2022-08-30 DIAGNOSIS — M80051D Age-related osteoporosis with current pathological fracture, right femur, subsequent encounter for fracture with routine healing: Secondary | ICD-10-CM | POA: Diagnosis not present

## 2022-08-30 DIAGNOSIS — G43909 Migraine, unspecified, not intractable, without status migrainosus: Secondary | ICD-10-CM | POA: Diagnosis not present

## 2022-09-02 DIAGNOSIS — J4520 Mild intermittent asthma, uncomplicated: Secondary | ICD-10-CM | POA: Diagnosis not present

## 2022-09-02 DIAGNOSIS — G43909 Migraine, unspecified, not intractable, without status migrainosus: Secondary | ICD-10-CM | POA: Diagnosis not present

## 2022-09-02 DIAGNOSIS — D72819 Decreased white blood cell count, unspecified: Secondary | ICD-10-CM | POA: Diagnosis not present

## 2022-09-02 DIAGNOSIS — J4489 Other specified chronic obstructive pulmonary disease: Secondary | ICD-10-CM | POA: Diagnosis not present

## 2022-09-02 DIAGNOSIS — M80051D Age-related osteoporosis with current pathological fracture, right femur, subsequent encounter for fracture with routine healing: Secondary | ICD-10-CM | POA: Diagnosis not present

## 2022-09-02 DIAGNOSIS — M519 Unspecified thoracic, thoracolumbar and lumbosacral intervertebral disc disorder: Secondary | ICD-10-CM | POA: Diagnosis not present

## 2022-09-07 DIAGNOSIS — D72819 Decreased white blood cell count, unspecified: Secondary | ICD-10-CM | POA: Diagnosis not present

## 2022-09-07 DIAGNOSIS — J4520 Mild intermittent asthma, uncomplicated: Secondary | ICD-10-CM | POA: Diagnosis not present

## 2022-09-07 DIAGNOSIS — G43909 Migraine, unspecified, not intractable, without status migrainosus: Secondary | ICD-10-CM | POA: Diagnosis not present

## 2022-09-07 DIAGNOSIS — J4489 Other specified chronic obstructive pulmonary disease: Secondary | ICD-10-CM | POA: Diagnosis not present

## 2022-09-07 DIAGNOSIS — M80051D Age-related osteoporosis with current pathological fracture, right femur, subsequent encounter for fracture with routine healing: Secondary | ICD-10-CM | POA: Diagnosis not present

## 2022-09-07 DIAGNOSIS — M519 Unspecified thoracic, thoracolumbar and lumbosacral intervertebral disc disorder: Secondary | ICD-10-CM | POA: Diagnosis not present

## 2022-09-09 DIAGNOSIS — G43909 Migraine, unspecified, not intractable, without status migrainosus: Secondary | ICD-10-CM | POA: Diagnosis not present

## 2022-09-09 DIAGNOSIS — J4520 Mild intermittent asthma, uncomplicated: Secondary | ICD-10-CM | POA: Diagnosis not present

## 2022-09-09 DIAGNOSIS — D72819 Decreased white blood cell count, unspecified: Secondary | ICD-10-CM | POA: Diagnosis not present

## 2022-09-09 DIAGNOSIS — M80051D Age-related osteoporosis with current pathological fracture, right femur, subsequent encounter for fracture with routine healing: Secondary | ICD-10-CM | POA: Diagnosis not present

## 2022-09-09 DIAGNOSIS — J4489 Other specified chronic obstructive pulmonary disease: Secondary | ICD-10-CM | POA: Diagnosis not present

## 2022-09-09 DIAGNOSIS — M519 Unspecified thoracic, thoracolumbar and lumbosacral intervertebral disc disorder: Secondary | ICD-10-CM | POA: Diagnosis not present

## 2022-09-12 DIAGNOSIS — Z4889 Encounter for other specified surgical aftercare: Secondary | ICD-10-CM | POA: Diagnosis not present

## 2022-09-12 DIAGNOSIS — M1990 Unspecified osteoarthritis, unspecified site: Secondary | ICD-10-CM | POA: Diagnosis not present

## 2022-09-12 DIAGNOSIS — G8929 Other chronic pain: Secondary | ICD-10-CM | POA: Diagnosis not present

## 2022-09-12 DIAGNOSIS — I959 Hypotension, unspecified: Secondary | ICD-10-CM | POA: Diagnosis not present

## 2022-09-12 DIAGNOSIS — Z7982 Long term (current) use of aspirin: Secondary | ICD-10-CM | POA: Diagnosis not present

## 2022-09-12 DIAGNOSIS — M797 Fibromyalgia: Secondary | ICD-10-CM | POA: Diagnosis not present

## 2022-09-12 DIAGNOSIS — E876 Hypokalemia: Secondary | ICD-10-CM | POA: Diagnosis not present

## 2022-09-12 DIAGNOSIS — Z9181 History of falling: Secondary | ICD-10-CM | POA: Diagnosis not present

## 2022-09-12 DIAGNOSIS — M80051D Age-related osteoporosis with current pathological fracture, right femur, subsequent encounter for fracture with routine healing: Secondary | ICD-10-CM | POA: Diagnosis not present

## 2022-09-12 DIAGNOSIS — M5441 Lumbago with sciatica, right side: Secondary | ICD-10-CM | POA: Diagnosis not present

## 2022-09-12 DIAGNOSIS — J4489 Other specified chronic obstructive pulmonary disease: Secondary | ICD-10-CM | POA: Diagnosis not present

## 2022-09-12 DIAGNOSIS — M21371 Foot drop, right foot: Secondary | ICD-10-CM | POA: Diagnosis not present

## 2022-09-12 DIAGNOSIS — J4520 Mild intermittent asthma, uncomplicated: Secondary | ICD-10-CM | POA: Diagnosis not present

## 2022-09-12 DIAGNOSIS — D72819 Decreased white blood cell count, unspecified: Secondary | ICD-10-CM | POA: Diagnosis not present

## 2022-09-12 DIAGNOSIS — Z79899 Other long term (current) drug therapy: Secondary | ICD-10-CM | POA: Diagnosis not present

## 2022-09-12 DIAGNOSIS — M519 Unspecified thoracic, thoracolumbar and lumbosacral intervertebral disc disorder: Secondary | ICD-10-CM | POA: Diagnosis not present

## 2022-09-12 DIAGNOSIS — Z87442 Personal history of urinary calculi: Secondary | ICD-10-CM | POA: Diagnosis not present

## 2022-09-12 DIAGNOSIS — G43909 Migraine, unspecified, not intractable, without status migrainosus: Secondary | ICD-10-CM | POA: Diagnosis not present

## 2022-09-12 DIAGNOSIS — K219 Gastro-esophageal reflux disease without esophagitis: Secondary | ICD-10-CM | POA: Diagnosis not present

## 2022-09-12 DIAGNOSIS — Z7951 Long term (current) use of inhaled steroids: Secondary | ICD-10-CM | POA: Diagnosis not present

## 2022-09-14 DIAGNOSIS — J4520 Mild intermittent asthma, uncomplicated: Secondary | ICD-10-CM | POA: Diagnosis not present

## 2022-09-14 DIAGNOSIS — J4489 Other specified chronic obstructive pulmonary disease: Secondary | ICD-10-CM | POA: Diagnosis not present

## 2022-09-14 DIAGNOSIS — M80051D Age-related osteoporosis with current pathological fracture, right femur, subsequent encounter for fracture with routine healing: Secondary | ICD-10-CM | POA: Diagnosis not present

## 2022-09-14 DIAGNOSIS — D72819 Decreased white blood cell count, unspecified: Secondary | ICD-10-CM | POA: Diagnosis not present

## 2022-09-14 DIAGNOSIS — G43909 Migraine, unspecified, not intractable, without status migrainosus: Secondary | ICD-10-CM | POA: Diagnosis not present

## 2022-09-14 DIAGNOSIS — M519 Unspecified thoracic, thoracolumbar and lumbosacral intervertebral disc disorder: Secondary | ICD-10-CM | POA: Diagnosis not present

## 2022-09-20 DIAGNOSIS — S72144D Nondisplaced intertrochanteric fracture of right femur, subsequent encounter for closed fracture with routine healing: Secondary | ICD-10-CM | POA: Diagnosis not present

## 2022-09-22 DIAGNOSIS — D72819 Decreased white blood cell count, unspecified: Secondary | ICD-10-CM | POA: Diagnosis not present

## 2022-09-22 DIAGNOSIS — J4489 Other specified chronic obstructive pulmonary disease: Secondary | ICD-10-CM | POA: Diagnosis not present

## 2022-09-22 DIAGNOSIS — J4520 Mild intermittent asthma, uncomplicated: Secondary | ICD-10-CM | POA: Diagnosis not present

## 2022-09-22 DIAGNOSIS — M519 Unspecified thoracic, thoracolumbar and lumbosacral intervertebral disc disorder: Secondary | ICD-10-CM | POA: Diagnosis not present

## 2022-09-22 DIAGNOSIS — M80051D Age-related osteoporosis with current pathological fracture, right femur, subsequent encounter for fracture with routine healing: Secondary | ICD-10-CM | POA: Diagnosis not present

## 2022-09-22 DIAGNOSIS — G43909 Migraine, unspecified, not intractable, without status migrainosus: Secondary | ICD-10-CM | POA: Diagnosis not present

## 2022-09-28 DIAGNOSIS — M80051D Age-related osteoporosis with current pathological fracture, right femur, subsequent encounter for fracture with routine healing: Secondary | ICD-10-CM | POA: Diagnosis not present

## 2022-09-28 DIAGNOSIS — M519 Unspecified thoracic, thoracolumbar and lumbosacral intervertebral disc disorder: Secondary | ICD-10-CM | POA: Diagnosis not present

## 2022-09-28 DIAGNOSIS — J4489 Other specified chronic obstructive pulmonary disease: Secondary | ICD-10-CM | POA: Diagnosis not present

## 2022-09-28 DIAGNOSIS — J4520 Mild intermittent asthma, uncomplicated: Secondary | ICD-10-CM | POA: Diagnosis not present

## 2022-09-28 DIAGNOSIS — D72819 Decreased white blood cell count, unspecified: Secondary | ICD-10-CM | POA: Diagnosis not present

## 2022-09-28 DIAGNOSIS — G43909 Migraine, unspecified, not intractable, without status migrainosus: Secondary | ICD-10-CM | POA: Diagnosis not present

## 2022-10-04 ENCOUNTER — Encounter: Payer: Self-pay | Admitting: Nurse Practitioner

## 2022-10-07 DIAGNOSIS — J4489 Other specified chronic obstructive pulmonary disease: Secondary | ICD-10-CM | POA: Diagnosis not present

## 2022-10-07 DIAGNOSIS — G43909 Migraine, unspecified, not intractable, without status migrainosus: Secondary | ICD-10-CM | POA: Diagnosis not present

## 2022-10-07 DIAGNOSIS — D72819 Decreased white blood cell count, unspecified: Secondary | ICD-10-CM | POA: Diagnosis not present

## 2022-10-07 DIAGNOSIS — M519 Unspecified thoracic, thoracolumbar and lumbosacral intervertebral disc disorder: Secondary | ICD-10-CM | POA: Diagnosis not present

## 2022-10-07 DIAGNOSIS — J4520 Mild intermittent asthma, uncomplicated: Secondary | ICD-10-CM | POA: Diagnosis not present

## 2022-10-07 DIAGNOSIS — M80051D Age-related osteoporosis with current pathological fracture, right femur, subsequent encounter for fracture with routine healing: Secondary | ICD-10-CM | POA: Diagnosis not present

## 2022-10-12 ENCOUNTER — Other Ambulatory Visit: Payer: Medicare Other

## 2022-10-12 DIAGNOSIS — M1990 Unspecified osteoarthritis, unspecified site: Secondary | ICD-10-CM | POA: Diagnosis not present

## 2022-10-12 DIAGNOSIS — Z9181 History of falling: Secondary | ICD-10-CM | POA: Diagnosis not present

## 2022-10-12 DIAGNOSIS — E876 Hypokalemia: Secondary | ICD-10-CM | POA: Diagnosis not present

## 2022-10-12 DIAGNOSIS — G43909 Migraine, unspecified, not intractable, without status migrainosus: Secondary | ICD-10-CM | POA: Diagnosis not present

## 2022-10-12 DIAGNOSIS — M519 Unspecified thoracic, thoracolumbar and lumbosacral intervertebral disc disorder: Secondary | ICD-10-CM | POA: Diagnosis not present

## 2022-10-12 DIAGNOSIS — D72819 Decreased white blood cell count, unspecified: Secondary | ICD-10-CM | POA: Diagnosis not present

## 2022-10-12 DIAGNOSIS — M25512 Pain in left shoulder: Secondary | ICD-10-CM | POA: Diagnosis not present

## 2022-10-12 DIAGNOSIS — M5441 Lumbago with sciatica, right side: Secondary | ICD-10-CM | POA: Diagnosis not present

## 2022-10-12 DIAGNOSIS — I959 Hypotension, unspecified: Secondary | ICD-10-CM | POA: Diagnosis not present

## 2022-10-12 DIAGNOSIS — G8929 Other chronic pain: Secondary | ICD-10-CM | POA: Diagnosis not present

## 2022-10-12 DIAGNOSIS — M797 Fibromyalgia: Secondary | ICD-10-CM | POA: Diagnosis not present

## 2022-10-12 DIAGNOSIS — J4520 Mild intermittent asthma, uncomplicated: Secondary | ICD-10-CM | POA: Diagnosis not present

## 2022-10-12 DIAGNOSIS — Z79899 Other long term (current) drug therapy: Secondary | ICD-10-CM | POA: Diagnosis not present

## 2022-10-12 DIAGNOSIS — K219 Gastro-esophageal reflux disease without esophagitis: Secondary | ICD-10-CM | POA: Diagnosis not present

## 2022-10-12 DIAGNOSIS — Z7951 Long term (current) use of inhaled steroids: Secondary | ICD-10-CM | POA: Diagnosis not present

## 2022-10-12 DIAGNOSIS — Z87442 Personal history of urinary calculi: Secondary | ICD-10-CM | POA: Diagnosis not present

## 2022-10-12 DIAGNOSIS — M80051D Age-related osteoporosis with current pathological fracture, right femur, subsequent encounter for fracture with routine healing: Secondary | ICD-10-CM | POA: Diagnosis not present

## 2022-10-12 DIAGNOSIS — Q631 Lobulated, fused and horseshoe kidney: Secondary | ICD-10-CM | POA: Diagnosis not present

## 2022-10-12 DIAGNOSIS — R42 Dizziness and giddiness: Secondary | ICD-10-CM | POA: Diagnosis not present

## 2022-10-12 DIAGNOSIS — M21371 Foot drop, right foot: Secondary | ICD-10-CM | POA: Diagnosis not present

## 2022-10-12 DIAGNOSIS — Z7982 Long term (current) use of aspirin: Secondary | ICD-10-CM | POA: Diagnosis not present

## 2022-10-13 DIAGNOSIS — G43909 Migraine, unspecified, not intractable, without status migrainosus: Secondary | ICD-10-CM | POA: Diagnosis not present

## 2022-10-13 DIAGNOSIS — D72819 Decreased white blood cell count, unspecified: Secondary | ICD-10-CM | POA: Diagnosis not present

## 2022-10-13 DIAGNOSIS — M519 Unspecified thoracic, thoracolumbar and lumbosacral intervertebral disc disorder: Secondary | ICD-10-CM | POA: Diagnosis not present

## 2022-10-13 DIAGNOSIS — M5441 Lumbago with sciatica, right side: Secondary | ICD-10-CM | POA: Diagnosis not present

## 2022-10-13 DIAGNOSIS — M80051D Age-related osteoporosis with current pathological fracture, right femur, subsequent encounter for fracture with routine healing: Secondary | ICD-10-CM | POA: Diagnosis not present

## 2022-10-13 DIAGNOSIS — J4520 Mild intermittent asthma, uncomplicated: Secondary | ICD-10-CM | POA: Diagnosis not present

## 2022-10-15 DIAGNOSIS — M7552 Bursitis of left shoulder: Secondary | ICD-10-CM | POA: Diagnosis not present

## 2022-10-15 DIAGNOSIS — M7542 Impingement syndrome of left shoulder: Secondary | ICD-10-CM | POA: Diagnosis not present

## 2022-10-19 DIAGNOSIS — G43909 Migraine, unspecified, not intractable, without status migrainosus: Secondary | ICD-10-CM | POA: Diagnosis not present

## 2022-10-19 DIAGNOSIS — M519 Unspecified thoracic, thoracolumbar and lumbosacral intervertebral disc disorder: Secondary | ICD-10-CM | POA: Diagnosis not present

## 2022-10-19 DIAGNOSIS — J4520 Mild intermittent asthma, uncomplicated: Secondary | ICD-10-CM | POA: Diagnosis not present

## 2022-10-19 DIAGNOSIS — M80051D Age-related osteoporosis with current pathological fracture, right femur, subsequent encounter for fracture with routine healing: Secondary | ICD-10-CM | POA: Diagnosis not present

## 2022-10-19 DIAGNOSIS — M5441 Lumbago with sciatica, right side: Secondary | ICD-10-CM | POA: Diagnosis not present

## 2022-10-19 DIAGNOSIS — D72819 Decreased white blood cell count, unspecified: Secondary | ICD-10-CM | POA: Diagnosis not present

## 2022-10-27 DIAGNOSIS — M5441 Lumbago with sciatica, right side: Secondary | ICD-10-CM | POA: Diagnosis not present

## 2022-10-27 DIAGNOSIS — G43909 Migraine, unspecified, not intractable, without status migrainosus: Secondary | ICD-10-CM | POA: Diagnosis not present

## 2022-10-27 DIAGNOSIS — M519 Unspecified thoracic, thoracolumbar and lumbosacral intervertebral disc disorder: Secondary | ICD-10-CM | POA: Diagnosis not present

## 2022-10-27 DIAGNOSIS — D72819 Decreased white blood cell count, unspecified: Secondary | ICD-10-CM | POA: Diagnosis not present

## 2022-10-27 DIAGNOSIS — M80051D Age-related osteoporosis with current pathological fracture, right femur, subsequent encounter for fracture with routine healing: Secondary | ICD-10-CM | POA: Diagnosis not present

## 2022-10-27 DIAGNOSIS — J4520 Mild intermittent asthma, uncomplicated: Secondary | ICD-10-CM | POA: Diagnosis not present

## 2022-11-01 DIAGNOSIS — S72144D Nondisplaced intertrochanteric fracture of right femur, subsequent encounter for closed fracture with routine healing: Secondary | ICD-10-CM | POA: Diagnosis not present

## 2022-11-02 DIAGNOSIS — D72819 Decreased white blood cell count, unspecified: Secondary | ICD-10-CM | POA: Diagnosis not present

## 2022-11-02 DIAGNOSIS — G43909 Migraine, unspecified, not intractable, without status migrainosus: Secondary | ICD-10-CM | POA: Diagnosis not present

## 2022-11-02 DIAGNOSIS — J4520 Mild intermittent asthma, uncomplicated: Secondary | ICD-10-CM | POA: Diagnosis not present

## 2022-11-02 DIAGNOSIS — M80051D Age-related osteoporosis with current pathological fracture, right femur, subsequent encounter for fracture with routine healing: Secondary | ICD-10-CM | POA: Diagnosis not present

## 2022-11-02 DIAGNOSIS — M519 Unspecified thoracic, thoracolumbar and lumbosacral intervertebral disc disorder: Secondary | ICD-10-CM | POA: Diagnosis not present

## 2022-11-02 DIAGNOSIS — M5441 Lumbago with sciatica, right side: Secondary | ICD-10-CM | POA: Diagnosis not present

## 2022-11-09 DIAGNOSIS — M5441 Lumbago with sciatica, right side: Secondary | ICD-10-CM | POA: Diagnosis not present

## 2022-11-09 DIAGNOSIS — J4520 Mild intermittent asthma, uncomplicated: Secondary | ICD-10-CM | POA: Diagnosis not present

## 2022-11-09 DIAGNOSIS — M80051D Age-related osteoporosis with current pathological fracture, right femur, subsequent encounter for fracture with routine healing: Secondary | ICD-10-CM | POA: Diagnosis not present

## 2022-11-09 DIAGNOSIS — G43909 Migraine, unspecified, not intractable, without status migrainosus: Secondary | ICD-10-CM | POA: Diagnosis not present

## 2022-11-09 DIAGNOSIS — M519 Unspecified thoracic, thoracolumbar and lumbosacral intervertebral disc disorder: Secondary | ICD-10-CM | POA: Diagnosis not present

## 2022-11-09 DIAGNOSIS — D72819 Decreased white blood cell count, unspecified: Secondary | ICD-10-CM | POA: Diagnosis not present

## 2022-11-26 DIAGNOSIS — Z419 Encounter for procedure for purposes other than remedying health state, unspecified: Secondary | ICD-10-CM | POA: Diagnosis not present

## 2022-11-26 DIAGNOSIS — L821 Other seborrheic keratosis: Secondary | ICD-10-CM | POA: Diagnosis not present

## 2022-12-07 DIAGNOSIS — S72144D Nondisplaced intertrochanteric fracture of right femur, subsequent encounter for closed fracture with routine healing: Secondary | ICD-10-CM | POA: Diagnosis not present

## 2022-12-17 DIAGNOSIS — J301 Allergic rhinitis due to pollen: Secondary | ICD-10-CM | POA: Diagnosis not present

## 2022-12-17 DIAGNOSIS — H903 Sensorineural hearing loss, bilateral: Secondary | ICD-10-CM | POA: Diagnosis not present

## 2022-12-17 DIAGNOSIS — H6983 Other specified disorders of Eustachian tube, bilateral: Secondary | ICD-10-CM | POA: Diagnosis not present

## 2023-01-04 ENCOUNTER — Ambulatory Visit (INDEPENDENT_AMBULATORY_CARE_PROVIDER_SITE_OTHER): Payer: Medicare Other | Admitting: Internal Medicine

## 2023-01-04 ENCOUNTER — Encounter: Payer: Self-pay | Admitting: Internal Medicine

## 2023-01-04 VITALS — BP 114/76 | HR 90 | Ht 66.0 in | Wt 141.0 lb

## 2023-01-04 DIAGNOSIS — K31A0 Gastric intestinal metaplasia, unspecified: Secondary | ICD-10-CM | POA: Diagnosis not present

## 2023-01-04 DIAGNOSIS — R1310 Dysphagia, unspecified: Secondary | ICD-10-CM

## 2023-01-04 DIAGNOSIS — K2100 Gastro-esophageal reflux disease with esophagitis, without bleeding: Secondary | ICD-10-CM | POA: Diagnosis not present

## 2023-01-04 NOTE — Patient Instructions (Addendum)
If dysphagia worsens please call office or send a mychart message  Follow up in 1 year  If your blood pressure at your visit was 140/90 or greater, please contact your primary care physician to follow up on this.  _______________________________________________________  If you are age 79 or older, your body mass index should be between 23-30. Your Body mass index is 22.76 kg/m. If this is out of the aforementioned range listed, please consider follow up with your Primary Care Provider.  If you are age 5 or younger, your body mass index should be between 19-25. Your Body mass index is 22.76 kg/m. If this is out of the aformentioned range listed, please consider follow up with your Primary Care Provider.   ________________________________________________________  The [facility] providers would like to encourage you to use [facility] to communicate with providers for non-urgent requests or questions.  Due to long hold times on the telephone, sending your provider a message by [facility] may be a faster and more efficient way to get a response.  Please allow 48 business hours for a response.  Please remember that this is for non-urgent requests.  _______________________________________________________    Thank you for entrusting me with your care and for choosing [facility], Dr. Eulah Pont

## 2023-01-04 NOTE — Progress Notes (Signed)
Referring Provider: Irena Reichmann, DO Primary Care Physician:  Irena Reichmann, DO  Chief complaint:  GERD   IMPRESSION:  GERD Dysphagia GIM Patient presents for follow up of GERD. She has been managing this with PPI QD, which seems to working well. Due to a recent hip fracture, she has been taking Motrin PRN for pain control. Thus she increased her PPI dosage from 20 mg to 40 mg QD to prevent PUD. I told her that this was appropriate. Once she is off of Motrin, she can decrease her PPI back down to 20 mg QD. She does describe some dysphagia during our visit today, which has had benefit from esophageal dilation in the past, but she will let me know if she wants another EGD for esophageal dilation. She has a history of GIM, though no surveillance was warranted since her GIM is limited to the antrum based upon biopsies in 2022.  PLAN: - Continue PPI QD. She will let me know if she needs refills - Continue famotidine PRN (using infrequently) - Patient will let me know if she wants to get an EGD for esophageal dilation in the future - RTC in 1 year   HPI: Leslie Dixon is a 77 y.o. female with history of mild intermittent asthma, seasonal allergies, LPR, ocular migraines, benign thyroid nodule, osteoporosis, IBS, GERD, and cholecystectomy presents for follow up of GERD  Interval History: She has bad reflux if she doesn't pay attention what she is eating. She fell in 07/2022 and broke her right femur. She had an operation for her broken hip and has been recovering. She initially was treated with tramadol and methocarbamol for pain control, which made her sick. Thus she was switched to Motrin and Tylenol instead. She has been using Lidocaine patches. Because she has been taking Motrin, she increased the dosage of her Prilosec from 20 mg QD to 40 mg QD. This medication seems to be keeping her reflux under good control. She does feel like she gets strangled from time to time. If she eats french fries,  the food can sometimes get stuck. This dysphagia does seem to be getting slightly worse over time. Denies N&V. Denies odynophagia. Denies abdominal pain. Her prior esophageal dilations did help in the past (most recently done in 2022). Bowel habits are regular. She has one BM per day.   Prior endoscopic evaluation: - Colonoscopy with Dr. Randa Evens 2012: internal hemorrhoids - Upper endoscopy with Dr. Randa Evens in 2015 - no biopsies obtained at that time - Upper endoscopy with Dr. 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  There was no H. pylori.  No dysplasia. - EGD 04/20/21: reactive gastropathy with focal intestinal metaplasia in the antrum, reactive gastropathy, and reflux. Duodenal biopsies were normal. Empiric dilation was performed with TTS 16-54mm balloon.  Recent pertinent labs: - Cologuard negative 09/10/2020 - Labs 12/07/19 showed a normal BMP. Unable to located additional labs.   Past Medical History:  Diagnosis Date   Allergy    Arthritis    Asthma    Blood dyscrasia    LEUKOPENIA- DR. CHISM - NO TREATMENT BUT IS MONITORED   Cataract    Chronic back pain    Chronic back pain    Fibromyalgia    Foot drop, right  RELATED TO LUMBAR PROBLEMS   GERD (gastroesophageal reflux disease)    PRILOSEC PRN   History of kidney stones    Horseshoe kidney    BILATERAL   Internal hemorrhoids    Leukopenia    Migraine    OCCULAR MIGRAINES - AURA WITH EYES   Osteoporosis    Pain    LUMBAR PAIN - RECENT FALL BECAUSE LEGS GAVE OUT - PT HAS HAD LUMBAR INJECTION SINCE THE FALL THAT HAS HELPED BACK PAIN   Pain    CHRONIC RIGHT UPPER QUADRANT PAIN - RELATED TO GALLBLADDER PROBLEM   Sciatica of right side     Sinusitis    f/u with Dr. Jearld Fenton.    Vertigo     Past Surgical History:  Procedure Laterality Date   ABDOMINAL HYSTERECTOMY  1983   APPENDECTOMY  1968   BILATERAL SALPINGOOPHORECTOMY  2000   BREAST EXCISIONAL BIOPSY Right 1979   CHOLECYSTECTOMY N/A 04/12/2014   Procedure: LAPAROSCOPIC CHOLECYSTECTOMY WITH INTRAOPERATIVE CHOLANGIOGRAM;  Surgeon: Wenda Low, MD;  Location: [facility];  Service: General;  Laterality: N/A;   INTRAMEDULLARY (IM) NAIL INTERTROCHANTERIC Right 08/08/2022   Procedure: INTRAMEDULLARY (IM) NAIL INTERTROCHANTERIC;  Surgeon: Samson Frederic, MD;  Location: [facility];  Service: Orthopedics;  Laterality: Right;   Lumbar/cervical surgeries     CERVICAL FUSION C7-C6 WITH BONE GRAFT; C6-C5 WITH PLATE AND 4 SCREWS;  3 LUMBAR SURGERIES BUT NO FUSION   NASAL SINUS SURGERY  2010   right knee     ARTHROSCOPY   TONSILLECTOMY     TYMPANOSTOMY TUBE PLACEMENT  07/2017   WISDOM TOOTH PULLED        Current Outpatient Medications  Medication Sig Dispense Refill   albuterol (VENTOLIN HFA) 108 (90 Base) MCG/ACT inhaler Inhale 2 puffs into the lungs every 6 (six) hours as needed for wheezing or shortness of breath. 54 g 1   Cholecalciferol (VITAMIN D-3) 125 MCG (5000 UT) TABS Take 1 tablet by mouth daily.     Fluticasone Furoate (ARNUITY ELLIPTA) 100 MCG/ACT AEPB USE 1 INHALATION DAILY 90 each 3   loratadine (CLARITIN) 10 MG tablet Take 10 mg by mouth daily.     methocarbamol (ROBAXIN) 500 MG tablet Take 1 tablet (500 mg total) by mouth every 6 (six) hours as needed for muscle spasms. 20 tablet 0   omeprazole (PRILOSEC) 20 MG capsule Take 1 capsule (20 mg total) by mouth daily. 90 capsule 3   traMADol (ULTRAM) 50 MG tablet Take 1 tablet (50 mg total) by mouth every 6 (six) hours as needed for moderate pain or severe pain. 20 tablet 0   triamcinolone (NASACORT) 55 MCG/ACT AERO nasal inhaler Place 2 sprays into the nose daily as needed (allergies).     TURMERIC PO Take 2,000 Units by mouth  daily at 8 pm.     vitamin B-12 (CYANOCOBALAMIN) 1000 MCG tablet Take 1,000 mcg by mouth once a week.     vitamin E 400 UNIT capsule Take 400 Units by mouth in the morning and at bedtime.     No current facility-administered medications for this visit.    Allergies as of 01/04/2023 - Review Complete 01/04/2023  Allergen Reaction Noted   Peanut oil Anaphylaxis 05/06/2011   Singulair [montelukast]  09/06/2018   Aspartame and phenylalanine  05/15/2021   Clarithromycin  03/07/2007   Codeine Other (See Comments) 03/07/2007   Lactose  05/06/2011   Meloxicam Other (See Comments) 03/09/2021   Other  04/05/2014   Penicillins  03/07/2007  Pregabalin  06/09/2007   Sulfa antibiotics Other (See Comments) 10/02/2014   Sulfonamide derivatives  03/07/2007   Cefdinir Rash 09/30/2016   Glycerin Other (See Comments) 05/15/2021      Physical Exam: Vitals:   01/04/23 1003  BP: 114/76  Pulse: 90   General:   Alert,  well-nourished, pleasant and cooperative in NAD Head:  Normocephalic and atraumatic. Eyes:  Sclera clear, no icterus.   Conjunctiva pink. Abdomen:  Soft, nontender, nondistended, normal bowel sounds Neurologic:  Alert and  oriented x4;  grossly nonfocal Skin:  Intact without significant lesions or rashes. Psych:  Alert and cooperative. Normal mood and affect.    Eulah Pont, MD 01/04/2023, 10:09 AM   I spent 35 minutes of time, including in depth chart review, independent review of results as outlined above, communicating results with the patient directly, face-to-face time with the patient, coordinating care, and ordering studies and medications as appropriate, and documentation.

## 2023-01-19 ENCOUNTER — Encounter: Payer: Self-pay | Admitting: Pulmonary Disease

## 2023-01-19 ENCOUNTER — Ambulatory Visit (INDEPENDENT_AMBULATORY_CARE_PROVIDER_SITE_OTHER): Payer: Medicare Other | Admitting: Pulmonary Disease

## 2023-01-19 VITALS — BP 120/68 | HR 79 | Temp 98.0°F | Ht 66.0 in | Wt 141.2 lb

## 2023-01-19 DIAGNOSIS — J329 Chronic sinusitis, unspecified: Secondary | ICD-10-CM

## 2023-01-19 DIAGNOSIS — J4520 Mild intermittent asthma, uncomplicated: Secondary | ICD-10-CM | POA: Diagnosis not present

## 2023-01-19 DIAGNOSIS — K219 Gastro-esophageal reflux disease without esophagitis: Secondary | ICD-10-CM | POA: Diagnosis not present

## 2023-01-19 LAB — NITRIC OXIDE: Nitric Oxide: 12

## 2023-01-19 NOTE — Progress Notes (Signed)
Subjective:    Patient ID: Leslie Dixon, female    DOB: [birth year], 77 y.o.   MRN: [id number]  Patient Care Team: Irena Reichmann, DO as PCP - General (Family Medicine) Artis Delay, MD as Consulting Physician (Hematology and Oncology) Esaw Dace, MD as Attending Physician (Urology) Antony Contras, MD as Consulting Physician (Ophthalmology) Julio Sicks, NP as Nurse Practitioner (Obstetrics and Gynecology) Cherlyn Roberts, MD as Consulting Physician (Dermatology) Bud Face, MD as Referring Physician (Otolaryngology) Antony Contras, MD as Consulting Physician (Ophthalmology)  Chief Complaint  Patient presents with   Follow-up    No SOB or wheezing. Dry cough.     HPI Leslie Dixon is a 77 year old lifelong never smoker with mild intermittent asthma.  She was last seen here on 30 June 2022 by me.This is a scheduled visit.  She has not had any major exacerbations in the interim.  She did have an admission to [facility] on 13-11 August 2022 due to close comminuted anterior trochanteric fracture of the proximal right femur after a mechanical fall at home.  She had intramedullary fixation with fracture nail by Dr. Linna Caprice on 14 January.  She did well after the procedure.  She did not have respiratory issues during that admission.  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 Andee Poles for this issue.    Since her discharge from [facility] she had some recurrence of her dry cough she attributes this to the fact that she was not given her Arnuity Ellipta during her admission at [facility].  Once she restarted this medication she has done much better. She has not had any fevers, chills or sweats. Does not endorse any other symptomatology. Overall she feels well and looks well.  She continues to do her recommended physical therapy exercises at home.   Review of Systems A 10 point review of systems was performed and it is as noted above  otherwise negative.   Patient Active Problem List   Diagnosis Date Noted   Closed right hip fracture, initial encounter (HCC) 08/07/2022   Estrogen deficiency 05/02/2022   Mild intermittent asthma without complication 05/29/2019   Rib contusion, right, initial encounter 05/29/2019   Elevated HDL 05/31/2018   Panic attack as reaction to stress 05/31/2018   Lumbar post-laminectomy syndrome 01/24/2018   Gastroesophageal reflux disease 12/28/2017   Chronic neck pain 12/28/2017   Osteoarthritis 12/28/2017   Cervical radiculopathy 10/24/2017   Chronic pain 10/19/2017   DDD (degenerative disc disease), cervical 10/19/2017   Irritable bowel syndrome with diarrhea 09/29/2017   Migraine without status migrainosus, not intractable 09/29/2017   Family history of breast cancer- sister at age 16- new onset 02/23/2017   History of thyroid nodule- R side ( Dr Everardo All eval in 10/17) 02/23/2017   Status post lumbar surgery- foot drop R foot 02/23/2017   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   Right thyroid nodule 05/28/2016   Intolerance to cold 05/02/2016   History of non anemic vitamin B12 deficiency 05/02/2016   Environmental and seasonal allergies 04/20/2016   h/o GAD (had panic in past) 02/12/2016   GERD (gastroesophageal reflux disease)    Osteoporosis    Chronic back pain    Idiopathic benign Leukopenia    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   HYPOKALEMIA 01/03/2008   Allergic rhinitis due to pollen 12/13/2007   VOCAL CORD DISORDER 06/09/2007   LACTOSE INTOLERANCE 03/07/2007   Ocular migraine 03/07/2007   CARPAL TUNNEL  SYNDROME 03/07/2007   VARICOSE VEIN 03/07/2007   NEPHROLITHIASIS, HX OF 03/07/2007    Social History   Tobacco Use   Smoking status: Never   Smokeless tobacco: Never  Substance Use Topics   Alcohol use: No    Allergies  Allergen Reactions   Peanut Oil Anaphylaxis   Singulair [Montelukast]     Facial swelling   Aspartame And Phenylalanine    Clarithromycin      REACTION: GI   Codeine Other (See Comments)    REACTION: nausea and vomiting   Lactose     Other reaction(s): GI Upset (intolerance)   Meloxicam Other (See Comments)    Face swelling   Other     GLUTEN RESTRICTED LACTOSE INTOLERANT ALLERGIC TO NUTS AND CORN   Penicillins     REACTION: per allergy testing/Patient states she has taken Augmentin without complications.    Pregabalin     REACTION: headache, several side effects   Sulfa Antibiotics Other (See Comments)    REACTION: Hives, wheezing   Sulfonamide Derivatives     REACTION: Hives, wheezing   Cefdinir Rash   Glycerin Other (See Comments)    Migraines, eye problems    Current Meds  Medication Sig   albuterol (VENTOLIN HFA) 108 (90 Base) MCG/ACT inhaler Inhale 2 puffs into the lungs every 6 (six) hours as needed for wheezing or shortness of breath.   Cholecalciferol (VITAMIN D-3) 125 MCG (5000 UT) TABS Take 1 tablet by mouth daily.   Fluticasone Furoate (ARNUITY ELLIPTA) 100 MCG/ACT AEPB USE 1 INHALATION DAILY   loratadine (CLARITIN) 10 MG tablet Take 10 mg by mouth daily.   magnesium (MAGTAB) 84 MG ( ) TBCR SR tablet Take 250 mg by mouth.   omeprazole (PRILOSEC) 20 MG capsule Take 1 capsule (20 mg total) by mouth daily.   triamcinolone (NASACORT) 55 MCG/ACT AERO nasal inhaler Place 2 sprays into the nose daily as needed (allergies).   TURMERIC PO Take 2,000 Units by mouth daily at 8 pm.   vitamin B-12 (CYANOCOBALAMIN) 1000 MCG tablet Take 1,000 mcg by mouth once a week.   vitamin E 400 UNIT capsule Take 400 Units by mouth in the morning and at bedtime.    Immunization History  Administered Date(s) Administered   DT (Pediatric) 08/17/2010   DTaP 08/17/2010   Fluad Quad(high Dose 65+) 04/25/2019   Influenza Whole 04/30/2004, 04/26/2007   Influenza, High Dose Seasonal PF 05/13/2016, 05/10/2017, 04/25/2018, 04/15/2020   Influenza, Quadrivalent, Recombinant, Inj, Pf 05/05/2021   Influenza,inj,quad, With  Preservative 04/25/2017   Influenza-Unspecified 06/07/2011, 04/24/2012, 05/26/2022   PFIZER(Purple Top)SARS-COV-2 Vaccination 08/13/2019, 09/06/2019, 05/16/2020   Pneumococcal Conjugate-13 04/20/2016   Pneumococcal Polysaccharide-23 04/30/2004, 07/29/2008, 07/27/2010, 04/20/2016   Td 07/26/2002, 11/19/2003   Tdap 08/17/2010, 04/20/2016   Zoster Recombinat (Shingrix) 04/25/2018, 09/11/2018   Zoster, Live 07/27/2007        Objective:     BP 120/68 (BP Location: Left Arm, Cuff Size: Normal)   Pulse 79   Temp 98 F (36.7 C)   Ht 5\' 6"  (1.676 m)   Wt 141 lb 3.2 oz (64 kg)   SpO2 96%   BMI 22.79 kg/m   SpO2: 96 % O2 Device: None (Room air)  GENERAL: Well-developed well-nourished woman in no acute distress.Fully ambulatory.No conversational dyspnea. HEAD: Normocephalic, atraumatic. EYES: Pupils equal, round, reactive to light.  No scleral icterus. MOUTH: Teeth intact, oral mucosa moist.  No thrush. NECK: Supple. No thyromegaly. Trachea midline. No JVD.  No adenopathy. PULMONARY: Good air entry  bilaterally.  No adventitious sounds. CARDIOVASCULAR: S1 and S2. Regular rate and rhythm.  No rubs, murmurs or gallops heard. ABDOMEN: Benign. MUSCULOSKELETAL: No joint deformity, no clubbing, no edema. NEUROLOGIC: No focal deficits, no gait disturbance, speech is fluent. SKIN: Intact,warm,dry.  Limited exam: No rashes. PSYCH: Mood and behavior normal    Lab Results  Component Value Date   NITRICOXIDE 12 01/19/2023    Assessment & Plan:     ICD-10-CM   1. Mild intermittent asthma without complication  J45.20 Nitric oxide   Well compensated No evidence of type II inflammation Continue Arnuity 100, 1 inhalation daily Continue as needed albuterol Allergen avoidance    2. Laryngopharyngeal reflux (LPR)  K21.9    Patient currently on omeprazole Practicing antireflux measures Symptoms well-controlled    3. 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 Andee Poles      Orders Placed This Encounter  Procedures   Nitric oxide   Overall Leslie Dixon is doing well.  We will see her in follow-up in 3 to 4 months time she is to contact us prior to that time should any new difficulties arise.  Gailen Shelter, MD Advanced Bronchoscopy PCCM [facility]    *This note was dictated using voice recognition software/Dragon.  Despite best efforts to proofread, errors can occur which can change the meaning. Any transcriptional errors that result from this process are unintentional and may not be fully corrected at the time of dictation.

## 2023-01-19 NOTE — Patient Instructions (Signed)
Your lungs sounded clear.  No evidence of inflammation in the airway.  Continue Arnuity and the albuterol as needed.  Continue your medications for reflux.  We will see him in follow-up in 3 to 4 months to call sooner should any new problems arise.

## 2023-01-26 DIAGNOSIS — K219 Gastro-esophageal reflux disease without esophagitis: Secondary | ICD-10-CM | POA: Diagnosis not present

## 2023-01-26 DIAGNOSIS — Z79899 Other long term (current) drug therapy: Secondary | ICD-10-CM | POA: Diagnosis not present

## 2023-01-31 DIAGNOSIS — Z5189 Encounter for other specified aftercare: Secondary | ICD-10-CM | POA: Diagnosis not present

## 2023-01-31 DIAGNOSIS — M7061 Trochanteric bursitis, right hip: Secondary | ICD-10-CM | POA: Diagnosis not present

## 2023-02-02 DIAGNOSIS — M719 Bursopathy, unspecified: Secondary | ICD-10-CM | POA: Diagnosis not present

## 2023-02-02 DIAGNOSIS — J4520 Mild intermittent asthma, uncomplicated: Secondary | ICD-10-CM | POA: Diagnosis not present

## 2023-02-02 DIAGNOSIS — Z79899 Other long term (current) drug therapy: Secondary | ICD-10-CM | POA: Diagnosis not present

## 2023-02-02 DIAGNOSIS — M5136 Other intervertebral disc degeneration, lumbar region: Secondary | ICD-10-CM | POA: Diagnosis not present

## 2023-02-02 DIAGNOSIS — E559 Vitamin D deficiency, unspecified: Secondary | ICD-10-CM | POA: Diagnosis not present

## 2023-02-02 DIAGNOSIS — Z9109 Other allergy status, other than to drugs and biological substances: Secondary | ICD-10-CM | POA: Diagnosis not present

## 2023-02-02 DIAGNOSIS — E538 Deficiency of other specified B group vitamins: Secondary | ICD-10-CM | POA: Diagnosis not present

## 2023-02-02 DIAGNOSIS — R7309 Other abnormal glucose: Secondary | ICD-10-CM | POA: Diagnosis not present

## 2023-02-02 DIAGNOSIS — M5030 Other cervical disc degeneration, unspecified cervical region: Secondary | ICD-10-CM | POA: Diagnosis not present

## 2023-02-02 DIAGNOSIS — K219 Gastro-esophageal reflux disease without esophagitis: Secondary | ICD-10-CM | POA: Diagnosis not present

## 2023-02-02 DIAGNOSIS — G43909 Migraine, unspecified, not intractable, without status migrainosus: Secondary | ICD-10-CM | POA: Diagnosis not present

## 2023-02-11 ENCOUNTER — Other Ambulatory Visit: Payer: Self-pay | Admitting: Family Medicine

## 2023-02-11 DIAGNOSIS — Z1231 Encounter for screening mammogram for malignant neoplasm of breast: Secondary | ICD-10-CM

## 2023-02-17 DIAGNOSIS — M6281 Muscle weakness (generalized): Secondary | ICD-10-CM | POA: Diagnosis not present

## 2023-02-17 DIAGNOSIS — M25551 Pain in right hip: Secondary | ICD-10-CM | POA: Diagnosis not present

## 2023-02-22 DIAGNOSIS — M5451 Vertebrogenic low back pain: Secondary | ICD-10-CM | POA: Diagnosis not present

## 2023-03-14 DIAGNOSIS — M961 Postlaminectomy syndrome, not elsewhere classified: Secondary | ICD-10-CM | POA: Diagnosis not present

## 2023-04-04 ENCOUNTER — Ambulatory Visit: Payer: Medicare Other

## 2023-04-14 DIAGNOSIS — R229 Localized swelling, mass and lump, unspecified: Secondary | ICD-10-CM | POA: Diagnosis not present

## 2023-04-14 DIAGNOSIS — R1900 Intra-abdominal and pelvic swelling, mass and lump, unspecified site: Secondary | ICD-10-CM | POA: Diagnosis not present

## 2023-04-18 DIAGNOSIS — H2511 Age-related nuclear cataract, right eye: Secondary | ICD-10-CM | POA: Diagnosis not present

## 2023-04-18 DIAGNOSIS — H20022 Recurrent acute iridocyclitis, left eye: Secondary | ICD-10-CM | POA: Diagnosis not present

## 2023-04-18 DIAGNOSIS — H52202 Unspecified astigmatism, left eye: Secondary | ICD-10-CM | POA: Diagnosis not present

## 2023-04-18 DIAGNOSIS — H25011 Cortical age-related cataract, right eye: Secondary | ICD-10-CM | POA: Diagnosis not present

## 2023-04-18 DIAGNOSIS — Z961 Presence of intraocular lens: Secondary | ICD-10-CM | POA: Diagnosis not present

## 2023-04-19 ENCOUNTER — Ambulatory Visit
Admission: RE | Admit: 2023-04-19 | Discharge: 2023-04-19 | Disposition: A | Discharge status: Home / Self Care | Payer: Medicare Other | Source: Ambulatory Visit | Attending: Family Medicine | Admitting: Family Medicine

## 2023-04-19 DIAGNOSIS — Z1231 Encounter for screening mammogram for malignant neoplasm of breast: Secondary | ICD-10-CM

## 2023-04-21 ENCOUNTER — Ambulatory Visit: Payer: Medicare Other

## 2023-04-27 ENCOUNTER — Encounter: Payer: Self-pay | Admitting: Pulmonary Disease

## 2023-04-27 ENCOUNTER — Ambulatory Visit: Payer: Medicare Other | Admitting: Pulmonary Disease

## 2023-04-27 VITALS — BP 110/60 | HR 75 | Temp 98.1°F | Ht 66.0 in | Wt 141.6 lb

## 2023-04-27 DIAGNOSIS — Z23 Encounter for immunization: Secondary | ICD-10-CM

## 2023-04-27 DIAGNOSIS — J3089 Other allergic rhinitis: Secondary | ICD-10-CM

## 2023-04-27 DIAGNOSIS — K219 Gastro-esophageal reflux disease without esophagitis: Secondary | ICD-10-CM | POA: Diagnosis not present

## 2023-04-27 DIAGNOSIS — J302 Other seasonal allergic rhinitis: Secondary | ICD-10-CM | POA: Diagnosis not present

## 2023-04-27 DIAGNOSIS — J4520 Mild intermittent asthma, uncomplicated: Secondary | ICD-10-CM

## 2023-04-27 MED ORDER — ARNUITY ELLIPTA 100 MCG/ACT IN AEPB: INHALATION_SPRAY | RESPIRATORY_TRACT | 3 refills | Status: DC

## 2023-04-27 MED ORDER — ALBUTEROL SULFATE HFA 108 (90 BASE) MCG/ACT IN AERS: 2.0000 | INHALATION_SPRAY | Freq: Four times a day (QID) | RESPIRATORY_TRACT | 1 refills | Status: AC | PRN

## 2023-04-27 NOTE — Progress Notes (Unsigned)
Subjective:    Patient ID: Leslie Dixon, female    DOB: 06/03/1946, 77 y.o.   MRN: [id number]  Patient Care Team: Irena Reichmann, DO as PCP - General (Family Medicine) Artis Delay, MD as Consulting Physician (Hematology and Oncology) Esaw Dace, MD as Attending Physician (Urology) Antony Contras, MD as Consulting Physician (Ophthalmology) Julio Sicks, NP as Nurse Practitioner (Obstetrics and Gynecology) Cherlyn Roberts, MD as Consulting Physician (Dermatology) Bud Face, MD as Referring Physician (Otolaryngology) Antony Contras, MD as Consulting Physician (Ophthalmology)  Chief Complaint  Patient presents with   Follow-up    No SOB, wheezing or cough.     HPI Leslie Dixon is a 77 year old lifelong never smoker with mild intermittent asthma.  She was last seen here on 19 January 2023 by me.This is a scheduled visit.  She has not had any major exacerbations in the interim.  Recall that previously she had had an admission to [facility] on 13-11 August 2022 due to close comminuted anterior trochanteric fracture of the proximal right femur after a mechanical fall at home.  She had intramedullary fixation with fracture nail by Dr. Linna Caprice on 08 August 2022.  She did well after the procedure.  She did not have respiratory issues during that admission.  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 Andee Poles for this issue.  Since that time she has had difficulties with persistent pain after the intramedullary fixation and now notices a "clicking" sound on the extremity.  She is to follow-up with orthopedics and is also considering a second opinion at [facility].   Today she notes that she has no shortness of breath.  No wheezing and her cough is controlled.  She is on [employer] and notices that this controls her cough well.  She actually forgot to take the medication for 2 to 3 days a few weeks back and noticed that her cough recurred but once  she started the medication she had the issue resolved. She has not had any fevers, chills or sweats. Does not endorse any other symptomatology.  Use of albuterol is "rare".  Overall she feels well and looks well.  She continues to do her recommended physical therapy exercises at home.  She needs refills on her maintenance medications.  She needs flu vaccine today.   Review of Systems A 10 point review of systems was performed and it is as noted above otherwise negative.   Patient Active Problem List   Diagnosis Date Noted   Closed right hip fracture, initial encounter (HCC) 08/07/2022   Estrogen deficiency 05/02/2022   Mild intermittent asthma without complication 05/29/2019   Rib contusion, right, initial encounter 05/29/2019   Elevated HDL 05/31/2018   Panic attack as reaction to stress 05/31/2018   Lumbar post-laminectomy syndrome 01/24/2018   Gastroesophageal reflux disease 12/28/2017   Chronic neck pain 12/28/2017   Osteoarthritis 12/28/2017   Cervical radiculopathy 10/24/2017   Chronic pain 10/19/2017   DDD (degenerative disc disease), cervical 10/19/2017   Irritable bowel syndrome with diarrhea 09/29/2017   Migraine without status migrainosus, not intractable 09/29/2017   Family history of breast cancer- sister at age 12- new onset 02/23/2017   History of thyroid nodule- R side ( Dr Everardo All eval in 10/17) 02/23/2017   Status post lumbar surgery- foot drop R foot 02/23/2017   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   Right thyroid nodule 05/28/2016   Intolerance to cold 05/02/2016  History of non anemic vitamin B12 deficiency 05/02/2016   Environmental and seasonal allergies 04/20/2016   h/o GAD (had panic in past) 02/12/2016   GERD (gastroesophageal reflux disease)    Osteoporosis    Chronic back pain    Idiopathic benign Leukopenia    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   HYPOKALEMIA 01/03/2008   Allergic rhinitis due to pollen 12/13/2007   VOCAL CORD DISORDER 06/09/2007    LACTOSE INTOLERANCE 03/07/2007   Ocular migraine 03/07/2007   CARPAL TUNNEL SYNDROME 03/07/2007   VARICOSE VEIN 03/07/2007   NEPHROLITHIASIS, HX OF 03/07/2007    Social History   Tobacco Use   Smoking status: Never   Smokeless tobacco: Never  Substance Use Topics   Alcohol use: No    Allergies  Allergen Reactions   Peanut Oil Anaphylaxis   Singulair [Montelukast]     Facial swelling   Aspartame And Phenylalanine    Clarithromycin     REACTION: GI   Codeine Other (See Comments)    REACTION: nausea and vomiting   Lactose     Other reaction(s): GI Upset (intolerance)   Meloxicam Other (See Comments)    Face swelling   Other     GLUTEN RESTRICTED LACTOSE INTOLERANT ALLERGIC TO NUTS AND CORN   Penicillins     REACTION: per allergy testing/Patient states she has taken Augmentin without complications.    Pregabalin     REACTION: headache, several side effects   Sulfa Antibiotics Other (See Comments)    REACTION: Hives, wheezing   Sulfonamide Derivatives     REACTION: Hives, wheezing   Cefdinir Rash   Glycerin Other (See Comments)    Migraines, eye problems    Current Meds  Medication Sig   albuterol (VENTOLIN HFA) 108 (90 Base) MCG/ACT inhaler Inhale 2 puffs into the lungs every 6 (six) hours as needed for wheezing or shortness of breath.   Cholecalciferol (VITAMIN D-3) 125 MCG (5000 UT) TABS Take 1 tablet by mouth daily.   Fluticasone Furoate (ARNUITY ELLIPTA) 100 MCG/ACT AEPB USE 1 INHALATION DAILY   loratadine (CLARITIN) 10 MG tablet Take 10 mg by mouth daily.   magnesium (MAGTAB) 84 MG ( ) TBCR SR tablet Take 250 mg by mouth.   omeprazole (PRILOSEC) 20 MG capsule Take 1 capsule (20 mg total) by mouth daily.   triamcinolone (NASACORT) 55 MCG/ACT AERO nasal inhaler Place 2 sprays into the nose daily as needed (allergies).   vitamin B-12 (CYANOCOBALAMIN) 1000 MCG tablet Take 1,000 mcg by mouth once a week.   vitamin E 400 UNIT capsule Take 400 Units by mouth  in the morning and at bedtime.    Immunization History  Administered Date(s) Administered   DT (Pediatric) 08/17/2010   DTaP 08/17/2010   Fluad Quad(high Dose 65+) 04/25/2019   Influenza Whole 04/30/2004, 04/26/2007   Influenza, High Dose Seasonal PF 05/13/2016, 05/10/2017, 04/25/2018, 04/15/2020   Influenza, Quadrivalent, Recombinant, Inj, Pf 05/05/2021   Influenza,inj,quad, With Preservative 04/25/2017   Influenza-Unspecified 06/07/2011, 04/24/2012, 05/26/2022   PFIZER(Purple Top)SARS-COV-2 Vaccination 08/13/2019, 09/06/2019, 05/16/2020   Pneumococcal Conjugate-13 04/20/2016   Pneumococcal Polysaccharide-23 04/30/2004, 07/29/2008, 07/27/2010, 04/20/2016   Td 07/26/2002, 11/19/2003   Tdap 08/17/2010, 04/20/2016   Zoster Recombinant(Shingrix) 04/25/2018, 09/11/2018   Zoster, Live 07/27/2007        Objective:     BP 110/60 (BP Location: Right Arm, Cuff Size: Normal)   Pulse 75   Temp 98.1 F (36.7 C)   Ht 5\' 6"  (1.676 m)   Wt 141 lb 9.6 oz (64.2  kg)   SpO2 97%   BMI 22.85 kg/m   SpO2: 97 % O2 Device: None (Room air)  GENERAL: Well-developed well-nourished woman in no acute distress.Fully ambulatory.No conversational dyspnea. HEAD: Normocephalic, atraumatic. EYES: Pupils equal, round, reactive to light.  No scleral icterus. MOUTH: Teeth intact, oral mucosa moist.  No thrush. NECK: Supple. No thyromegaly. Trachea midline. No JVD.  No adenopathy. PULMONARY: Good air entry bilaterally.  No adventitious sounds. CARDIOVASCULAR: S1 and S2. Regular rate and rhythm.  No rubs, murmurs or gallops heard. ABDOMEN: Benign. MUSCULOSKELETAL: No joint deformity, no clubbing, no edema. NEUROLOGIC: No focal deficits, no gait disturbance, speech is fluent. SKIN: Intact,warm,dry.  Limited exam: No rashes. PSYCH: Mood and behavior normal   Assessment & Plan:     ICD-10-CM   1. Mild intermittent asthma without complication  J45.20 albuterol (VENTOLIN HFA) 108 (90 Base) MCG/ACT  inhaler   Mostly cough variant Well-controlled on Arnuity Continue Arnuity/as needed albuterol    2. Laryngopharyngeal reflux (LPR)  K21.9    Well-controlled Follows antireflux measures PPI per ENT    3. Perennial allergic rhinitis with seasonal variation  J30.89    J30.2    Follows nasal hygiene On Nasacort and Zyrtec per ENT    4. Need for influenza vaccination  Z23 Flu vaccine trivalent PF, 6mos and older(Flulaval,Afluria,Fluarix,Fluzone)   Patient received flu vaccine today.     Orders Placed This Encounter  Procedures   Flu vaccine trivalent PF, 6mos and older(Flulaval,Afluria,Fluarix,Fluzone)   Meds ordered this encounter  Medications   Fluticasone Furoate (ARNUITY ELLIPTA) 100 MCG/ACT AEPB    Sig: USE 1 INHALATION DAILY    Dispense:  90 each    Refill:  3   albuterol (VENTOLIN HFA) 108 (90 Base) MCG/ACT inhaler    Sig: Inhale 2 puffs into the lungs every 6 (six) hours as needed for wheezing or shortness of breath.    Dispense:  54 g    Refill:  1    Patient appears to be well compensated today.  She received her influenza vaccine today.  She is to continue Arnuity daily and as needed albuterol.  Refills were sent to [facility] at her request.  Will see her in follow-up in 6 months time she is to contact us prior to that time should any new difficulties arise.  Gailen Shelter, MD Advanced Bronchoscopy PCCM [facility]    *This note was dictated using voice recognition software/Dragon.  Despite best efforts to proofread, errors can occur which can change the meaning. Any transcriptional errors that result from this process are unintentional and may not be fully corrected at the time of dictation.

## 2023-04-27 NOTE — Patient Instructions (Addendum)
We gave you the influenza vaccine today.  Continue Arnuity daily, continue as needed albuterol.  Sent refills to [facility].  We will see him in follow-up in 6 months time call sooner should any new problems arise.

## 2023-05-05 ENCOUNTER — Telehealth: Payer: Self-pay | Admitting: Pulmonary Disease

## 2023-05-05 MED ORDER — FLUTICASONE PROPIONATE HFA 110 MCG/ACT IN AERO: 2.0000 | INHALATION_SPRAY | Freq: Two times a day (BID) | RESPIRATORY_TRACT | 4 refills | Status: DC

## 2023-05-05 NOTE — Telephone Encounter (Signed)
[employer] for life is no longer covering the Arnuity inhaler they are suggesting Asmanex, Alvesco, also a Fluticonse proportional diskus or elliptic. She would like to know what Dr. Reece Agar suggests concidering her heart flutter.

## 2023-05-05 NOTE — Telephone Encounter (Signed)
Lets do Alvesco 80 mcg, 2 puffs twice a day.

## 2023-05-05 NOTE — Telephone Encounter (Signed)
Patient advised that her insurance prefers Ventolin HFA instead of Alvesco. Prescription sent to [facility], per patient request. Nothing further needed.

## 2023-05-06 DIAGNOSIS — M7552 Bursitis of left shoulder: Secondary | ICD-10-CM | POA: Diagnosis not present

## 2023-05-06 DIAGNOSIS — M7542 Impingement syndrome of left shoulder: Secondary | ICD-10-CM | POA: Diagnosis not present

## 2023-05-09 DIAGNOSIS — Z5189 Encounter for other specified aftercare: Secondary | ICD-10-CM | POA: Diagnosis not present

## 2023-05-09 DIAGNOSIS — M7061 Trochanteric bursitis, right hip: Secondary | ICD-10-CM | POA: Diagnosis not present

## 2023-05-12 DIAGNOSIS — Q631 Lobulated, fused and horseshoe kidney: Secondary | ICD-10-CM | POA: Diagnosis not present

## 2023-05-12 DIAGNOSIS — N200 Calculus of kidney: Secondary | ICD-10-CM | POA: Diagnosis not present

## 2023-05-12 DIAGNOSIS — N1330 Unspecified hydronephrosis: Secondary | ICD-10-CM | POA: Diagnosis not present

## 2023-06-16 DIAGNOSIS — H903 Sensorineural hearing loss, bilateral: Secondary | ICD-10-CM | POA: Diagnosis not present

## 2023-07-05 DIAGNOSIS — D485 Neoplasm of uncertain behavior of skin: Secondary | ICD-10-CM | POA: Diagnosis not present

## 2023-07-05 DIAGNOSIS — D044 Carcinoma in situ of skin of scalp and neck: Secondary | ICD-10-CM | POA: Diagnosis not present

## 2023-07-25 DIAGNOSIS — R946 Abnormal results of thyroid function studies: Secondary | ICD-10-CM | POA: Diagnosis not present

## 2023-07-25 DIAGNOSIS — Z13220 Encounter for screening for lipoid disorders: Secondary | ICD-10-CM | POA: Diagnosis not present

## 2023-07-25 DIAGNOSIS — E538 Deficiency of other specified B group vitamins: Secondary | ICD-10-CM | POA: Diagnosis not present

## 2023-07-25 DIAGNOSIS — Z79899 Other long term (current) drug therapy: Secondary | ICD-10-CM | POA: Diagnosis not present

## 2023-07-25 DIAGNOSIS — R7309 Other abnormal glucose: Secondary | ICD-10-CM | POA: Diagnosis not present

## 2023-07-25 DIAGNOSIS — E559 Vitamin D deficiency, unspecified: Secondary | ICD-10-CM | POA: Diagnosis not present

## 2023-08-01 DIAGNOSIS — D044 Carcinoma in situ of skin of scalp and neck: Secondary | ICD-10-CM | POA: Diagnosis not present

## 2023-08-03 DIAGNOSIS — Z13220 Encounter for screening for lipoid disorders: Secondary | ICD-10-CM | POA: Diagnosis not present

## 2023-08-03 DIAGNOSIS — E559 Vitamin D deficiency, unspecified: Secondary | ICD-10-CM | POA: Diagnosis not present

## 2023-08-03 DIAGNOSIS — Z79899 Other long term (current) drug therapy: Secondary | ICD-10-CM | POA: Diagnosis not present

## 2023-08-03 DIAGNOSIS — E538 Deficiency of other specified B group vitamins: Secondary | ICD-10-CM | POA: Diagnosis not present

## 2023-08-03 DIAGNOSIS — J329 Chronic sinusitis, unspecified: Secondary | ICD-10-CM | POA: Diagnosis not present

## 2023-08-03 DIAGNOSIS — Z0000 Encounter for general adult medical examination without abnormal findings: Secondary | ICD-10-CM | POA: Diagnosis not present

## 2023-08-03 DIAGNOSIS — R946 Abnormal results of thyroid function studies: Secondary | ICD-10-CM | POA: Diagnosis not present

## 2023-08-03 DIAGNOSIS — R7309 Other abnormal glucose: Secondary | ICD-10-CM | POA: Diagnosis not present

## 2023-08-24 DIAGNOSIS — R059 Cough, unspecified: Secondary | ICD-10-CM | POA: Diagnosis not present

## 2023-08-24 DIAGNOSIS — H6983 Other specified disorders of Eustachian tube, bilateral: Secondary | ICD-10-CM | POA: Diagnosis not present

## 2023-08-24 DIAGNOSIS — J0100 Acute maxillary sinusitis, unspecified: Secondary | ICD-10-CM | POA: Diagnosis not present

## 2023-09-09 DIAGNOSIS — J0100 Acute maxillary sinusitis, unspecified: Secondary | ICD-10-CM | POA: Diagnosis not present

## 2023-09-12 DIAGNOSIS — Z48817 Encounter for surgical aftercare following surgery on the skin and subcutaneous tissue: Secondary | ICD-10-CM | POA: Diagnosis not present

## 2023-09-21 ENCOUNTER — Ambulatory Visit
Admission: RE | Admit: 2023-09-21 | Discharge: 2023-09-21 | Disposition: A | Discharge status: Home / Self Care | Payer: Medicare Other | Source: Ambulatory Visit | Attending: Pulmonary Disease | Admitting: Pulmonary Disease

## 2023-09-21 ENCOUNTER — Ambulatory Visit: Payer: Medicare Other | Admitting: Pulmonary Disease

## 2023-09-21 ENCOUNTER — Encounter: Payer: Self-pay | Admitting: Pulmonary Disease

## 2023-09-21 VITALS — BP 120/68 | HR 80 | Temp 97.1°F | Ht 66.0 in | Wt 146.2 lb

## 2023-09-21 DIAGNOSIS — J209 Acute bronchitis, unspecified: Secondary | ICD-10-CM

## 2023-09-21 DIAGNOSIS — J45909 Unspecified asthma, uncomplicated: Secondary | ICD-10-CM | POA: Diagnosis not present

## 2023-09-21 DIAGNOSIS — R0602 Shortness of breath: Secondary | ICD-10-CM | POA: Insufficient documentation

## 2023-09-21 DIAGNOSIS — J4541 Moderate persistent asthma with (acute) exacerbation: Secondary | ICD-10-CM

## 2023-09-21 MED ORDER — DOXYCYCLINE HYCLATE 100 MG PO TABS: 100.0000 mg | ORAL_TABLET | Freq: Two times a day (BID) | ORAL | 0 refills | Status: AC

## 2023-09-21 MED ORDER — METHYLPREDNISOLONE 4 MG PO TBPK: ORAL_TABLET | ORAL | 0 refills | Status: DC

## 2023-09-21 NOTE — Patient Instructions (Signed)
 VISIT SUMMARY:  During your visit, we discussed your ongoing symptoms of a respiratory infection, which have been persistent for several months. We also discussed your mild intermittent asthma and the importance of managing your symptoms effectively. We reviewed your current medications and discussed potential changes to your treatment plan.  YOUR PLAN:  -MILD INTERMITTENT ASTHMA WITH RESPIRATORY INFECTION: You have been experiencing symptoms of a respiratory infection for several months. We have prescribed doxycycline to help with the current infection, a Medrol Dosepak to reduce inflammation, and recommended the use of your albuterol inhaler as needed. We also discussed the possibility of switching to Trelegy for better asthma control after your current infection is resolved.  -GENERAL HEALTH MAINTENANCE: We discussed the importance of proper medication use and symptom management for your respiratory condition. We also talked about the potential switch to Trelegy for improved asthma control and the use of Mucinex DM extra strength over-the-counter for cough management.  INSTRUCTIONS:  Please take the prescribed medications as directed. Use your albuterol inhaler as needed for symptom relief. We have ordered a chest x-ray to further assess your condition. We will call you with the results. Please schedule a follow-up appointment in 2-3 weeks to reassess your asthma management post-infection.

## 2023-09-21 NOTE — Progress Notes (Signed)
 Subjective:    Patient ID: Leslie Dixon, female    DOB: 1946/02/07, 78 y.o.   MRN: [id number]  Patient Care Team: Irena Reichmann, DO as PCP - General (Family Medicine) Artis Delay, MD as Consulting Physician (Hematology and Oncology) Esaw Dace, MD as Attending Physician (Urology) Antony Contras, MD as Consulting Physician (Ophthalmology) Julio Sicks, NP as Nurse Practitioner (Obstetrics and Gynecology) Cherlyn Roberts, MD as Consulting Physician (Dermatology) Bud Face, MD as Referring Physician (Otolaryngology) Antony Contras, MD as Consulting Physician (Ophthalmology) Salena Saner, MD as Consulting Physician (Pulmonary Disease)  Chief Complaint  Patient presents with   Follow-up    Sick for 2 months. Has seen her ENT. Has had 1 round of antibiotics and Prednisone. Cough with white sputum. Wheezing. Increased SOB.     BACKGROUND/INTERVAL: Leslie Dixon is an 78 year old lifelong never smoker with a history of mild intermittent asthma who presents for follow-up.  She was last seen 27 April 2023.  At that time she was doing well with Arnuity and as needed albuterol.  HPI Discussed the use of AI scribe software for clinical note transcription with the patient, who gave verbal consent to proceed.  History of Present Illness   Leslie Dixon is a 78 year old female with mild intermittent asthma who presents with symptoms of respiratory infection.  Symptoms have been present in a waxing and waning manner for 2 months.  She has been experiencing symptoms of a respiratory infection for several months, initially thought to be a sinus infection in early January. Despite treatment, her symptoms worsened, leading to consultations with her primary care doctor and Dr. Andee Poles, ENT. She feels 'tired' and 'worn out' from the illness.  In early January, she was prescribed Medrol dose pack by her primary care doctor, but her symptoms worsened. On January 29th, Dr. Andee Poles prescribed  amoxicillin, a Medrol Dosepak, and [employer], which provided slight improvement. On February 14th, Dr. Alessandra Bevels noted improvement in her ears and sinuses but prescribed an additional seven days of Augmentin due to persistent symptoms. Delays in obtaining medication due to insurance changes contributed to ongoing symptoms.  She describes chest soreness and fatigue, with episodes of wheezing. She used her albuterol inhaler several times after January 29th, which helped alleviate chest tightness and wheezing. She also experiences hoarseness after using the inhaler, which she manages with diphenhydramine.  She endorses rinsing her mouth after inhaler use  Her current medications include albuterol inhaler, Arnuity, and Tessalon Perles. She has not used a nebulizer at home and is unfamiliar with any prior reactions to azithromycin.     Review of Systems A 10 point review of systems was performed and it is as noted above otherwise negative.   Patient Active Problem List   Diagnosis Date Noted   Closed right hip fracture, initial encounter (HCC) 08/07/2022   Estrogen deficiency 05/02/2022   Mild intermittent asthma without complication 05/29/2019   Rib contusion, right, initial encounter 05/29/2019   Elevated HDL 05/31/2018   Panic attack as reaction to stress 05/31/2018   Lumbar post-laminectomy syndrome 01/24/2018   Gastroesophageal reflux disease 12/28/2017   Chronic neck pain 12/28/2017   Osteoarthritis 12/28/2017   Cervical radiculopathy 10/24/2017   Chronic pain 10/19/2017   DDD (degenerative disc disease), cervical 10/19/2017   Irritable bowel syndrome with diarrhea 09/29/2017   Migraine without status migrainosus, not intractable 09/29/2017   Family history of breast cancer- sister at age 25- new onset 02/23/2017   History of thyroid nodule- R  side ( Dr Everardo All eval in 10/17) 02/23/2017   Status post lumbar surgery- foot drop R foot 02/23/2017   Mixed hearing loss of right ear  09/30/2016   Right thyroid nodule 05/28/2016   Intolerance to cold 05/02/2016   History of non anemic vitamin B12 deficiency 05/02/2016   Environmental and seasonal allergies 04/20/2016   h/o GAD (had panic in past) 02/12/2016   GERD (gastroesophageal reflux disease)    Osteoporosis    Chronic back pain    Idiopathic benign Leukopenia    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   HYPOKALEMIA 01/03/2008   Allergic rhinitis due to pollen 12/13/2007   VOCAL CORD DISORDER 06/09/2007   LACTOSE INTOLERANCE 03/07/2007   Ocular migraine 03/07/2007   CARPAL TUNNEL SYNDROME 03/07/2007   VARICOSE VEIN 03/07/2007   NEPHROLITHIASIS, HX OF 03/07/2007    Social History   Tobacco Use   Smoking status: Never   Smokeless tobacco: Never  Substance Use Topics   Alcohol use: No    Allergies  Allergen Reactions   Peanut Oil Anaphylaxis   Singulair [Montelukast]     Facial swelling   Aspartame And Phenylalanine    Clarithromycin     REACTION: GI   Codeine Other (See Comments)    REACTION: nausea and vomiting   Lactose     Other reaction(s): GI Upset (intolerance)   Meloxicam Other (See Comments)    Face swelling   Other     GLUTEN RESTRICTED LACTOSE INTOLERANT ALLERGIC TO NUTS AND CORN   Penicillins     REACTION: per allergy testing/Patient states she has taken Augmentin without complications.    Pregabalin     REACTION: headache, several side effects   Sulfa Antibiotics Other (See Comments)    REACTION: Hives, wheezing   Sulfonamide Derivatives     REACTION: Hives, wheezing   Cefdinir Rash   Glycerin Other (See Comments)    Migraines, eye problems    Current Meds  Medication Sig   albuterol (VENTOLIN HFA) 108 (90 Base) MCG/ACT inhaler Inhale 2 puffs into the lungs every 6 (six) hours as needed for wheezing or shortness of breath.   Cholecalciferol (VITAMIN D-3) 125 MCG (5000 UT) TABS Take 1 tablet by mouth daily.   fluticasone (FLOVENT HFA) 110 MCG/ACT inhaler Inhale 2 puffs into the  lungs 2 (two) times daily. Rinse mouth after use.   Fluticasone Furoate (ARNUITY ELLIPTA) 100 MCG/ACT AEPB Inhale 1 puff into the lungs daily.   loratadine (CLARITIN) 10 MG tablet Take 10 mg by mouth daily.   magnesium (MAGTAB) 84 MG ( ) TBCR SR tablet Take 250 mg by mouth.   omeprazole (PRILOSEC) 20 MG capsule Take 1 capsule (20 mg total) by mouth daily.   triamcinolone (NASACORT) 55 MCG/ACT AERO nasal inhaler Place 2 sprays into the nose daily as needed (allergies).   vitamin B-12 (CYANOCOBALAMIN) 1000 MCG tablet Take 1,000 mcg by mouth once a week.   vitamin E 400 UNIT capsule Take 400 Units by mouth in the morning and at bedtime.    Immunization History  Administered Date(s) Administered   DT (Pediatric) 08/17/2010   DTaP 08/17/2010   Fluad Quad(high Dose 65+) 04/25/2019   Influenza Whole 04/30/2004, 04/26/2007   Influenza, High Dose Seasonal PF 05/13/2016, 05/10/2017, 04/25/2018, 04/15/2020   Influenza, Quadrivalent, Recombinant, Inj, Pf 05/05/2021   Influenza, Seasonal, Injecte, Preservative Fre 04/27/2023   Influenza,inj,quad, With Preservative 04/25/2017   Influenza-Unspecified 06/07/2011, 04/24/2012, 05/26/2022   PFIZER(Purple Top)SARS-COV-2 Vaccination 08/13/2019, 09/06/2019, 05/16/2020   Pneumococcal Conjugate-13 04/20/2016  Pneumococcal Polysaccharide-23 04/30/2004, 07/29/2008, 07/27/2010, 04/20/2016   Td 07/26/2002, 11/19/2003   Tdap 08/17/2010, 04/20/2016   Zoster Recombinant(Shingrix) 04/25/2018, 09/11/2018   Zoster, Live 07/27/2007        Objective:     BP 120/68 (BP Location: Right Arm, Cuff Size: Normal)   Pulse 80   Temp (!) 97.1 F (36.2 C)   Ht 5\' 6"  (1.676 m)   Wt 146 lb 3.2 oz (66.3 kg)   SpO2 98%   BMI 23.60 kg/m   SpO2: 98 % O2 Device: None (Room air)  GENERAL: Well-developed well-nourished woman in no acute distress.Fully ambulatory.No conversational dyspnea.  Very nasal quality to speech. HEAD: Normocephalic, atraumatic. EYES: Pupils  equal, round, reactive to light.  No scleral icterus. MOUTH: Teeth intact, oral mucosa moist.  No thrush. NECK: Supple. No thyromegaly. Trachea midline. No JVD.  No adenopathy. PULMONARY: Good air entry bilaterally.  Few rhonchi, otherwise, no adventitious sounds. CARDIOVASCULAR: S1 and S2. Regular rate and rhythm.  No rubs, murmurs or gallops heard. ABDOMEN: Benign. MUSCULOSKELETAL: No joint deformity, no clubbing, no edema. NEUROLOGIC: No focal deficits, no gait disturbance, speech is fluent. SKIN: Intact,warm,dry.  Limited exam: No rashes. PSYCH: Mood and behavior normal     Assessment & Plan:     ICD-10-CM   1. Moderate persistent asthma with acute exacerbation  J45.41 DG Chest 2 View    2. Acute bronchitis, unspecified organism  J20.9     3. Shortness of breath  R06.02 DG Chest 2 View     Orders Placed This Encounter  Procedures   DG Chest 2 View    Standing Status:   Future    Number of Occurrences:   1    Expected Date:   09/21/2023    Expiration Date:   09/20/2024    Reason for Exam (SYMPTOM  OR DIAGNOSIS REQUIRED):   Asthma Exacerbation    Preferred imaging location?:   [facility]   Meds ordered this encounter  Medications   doxycycline (VIBRA-TABS) 100 MG tablet    Sig: Take 1 tablet (100 mg total) by mouth 2 (two) times daily for 7 days.    Dispense:  14 tablet    Refill:  0   methylPREDNISolone (MEDROL DOSEPAK) 4 MG TBPK tablet    Sig: Take as directed in the package.  This is a taper pack.    Dispense:  21 tablet    Refill:  0   Discussion:    Mild Intermittent Asthma with Respiratory Infection Presents with persistent respiratory infection symptoms for several months, initially treated as sinus infection with partial improvement from antibiotics (amoxicillin, Augmentin) and Medrol Dosepak. Reports chest tightness, wheezing, and fatigue. Physical exam reveals normal lung sounds with only few rhonchi. Albuterol provides some relief. Insurance issues  affect access to Arnuity; considering switch to Trelegy. Discussed doxycycline for current infection, benefits of Medrol Dosepak for inflammation, and importance of albuterol as needed. Plan to reassess asthma management post-infection. - Prescribe doxycycline 100 mg twice daily x 7 days - Order chest x-ray, rule out infiltrate - Prescribe Medrol Dosepak - Advise use of albuterol inhaler as needed - Recommend Mucinex DM (extra strength) twice a day - Continue current Arnuity until follow-up - Plan to switch to Trelegy after resolving current infection - Schedule follow-up in 2-3 weeks  General Health Maintenance Emphasized proper medication use and symptom management for respiratory condition. Discussed potential switch to Trelegy for improved asthma control. - Educate on proper use of albuterol inhaler - Discuss  potential switch to Trelegy for asthma management - Advise on the use of Mucinex DM for cough management  Follow-up - Schedule follow-up appointment in 2-3 weeks - Call with chest x-ray results.      Advised if symptoms do not improve or worsen, to please contact office for sooner follow up or seek emergency care.    I spent 40 minutes of dedicated to the care of this patient on the date of this encounter to include pre-visit review of records, face-to-face time with the patient discussing conditions above, post visit ordering of testing, clinical documentation with the electronic health record, making appropriate referrals as documented, and communicating necessary findings to members of the patients care team.     C. Danice Goltz, MD Advanced Bronchoscopy PCCM [facility]    *This note was generated using voice recognition software/Dragon and/or AI transcription program.  Despite best efforts to proofread, errors can occur which can change the meaning. Any transcriptional errors that result from this process are unintentional and may not be fully  corrected at the time of dictation.

## 2023-09-26 ENCOUNTER — Encounter: Payer: Self-pay | Admitting: Pulmonary Disease

## 2023-09-28 ENCOUNTER — Ambulatory Visit: Payer: Medicare Other | Admitting: Pulmonary Disease

## 2023-10-04 DIAGNOSIS — Z1212 Encounter for screening for malignant neoplasm of rectum: Secondary | ICD-10-CM | POA: Diagnosis not present

## 2023-10-04 DIAGNOSIS — Z1211 Encounter for screening for malignant neoplasm of colon: Secondary | ICD-10-CM | POA: Diagnosis not present

## 2023-10-09 LAB — COLOGUARD: COLOGUARD: POSITIVE — AB

## 2023-10-09 LAB — EXTERNAL GENERIC LAB PROCEDURE: COLOGUARD: POSITIVE — AB

## 2023-10-14 DIAGNOSIS — M7061 Trochanteric bursitis, right hip: Secondary | ICD-10-CM | POA: Diagnosis not present

## 2023-10-14 DIAGNOSIS — S72144D Nondisplaced intertrochanteric fracture of right femur, subsequent encounter for closed fracture with routine healing: Secondary | ICD-10-CM | POA: Diagnosis not present

## 2023-10-18 ENCOUNTER — Ambulatory Visit: Payer: Medicare Other | Admitting: Pulmonary Disease

## 2023-10-18 ENCOUNTER — Encounter: Payer: Self-pay | Admitting: Pulmonary Disease

## 2023-10-18 VITALS — BP 102/60 | HR 79 | Temp 97.6°F | Ht 66.0 in | Wt 145.8 lb

## 2023-10-18 DIAGNOSIS — R918 Other nonspecific abnormal finding of lung field: Secondary | ICD-10-CM

## 2023-10-18 DIAGNOSIS — K219 Gastro-esophageal reflux disease without esophagitis: Secondary | ICD-10-CM

## 2023-10-18 DIAGNOSIS — J3089 Other allergic rhinitis: Secondary | ICD-10-CM

## 2023-10-18 DIAGNOSIS — J4540 Moderate persistent asthma, uncomplicated: Secondary | ICD-10-CM

## 2023-10-18 DIAGNOSIS — J302 Other seasonal allergic rhinitis: Secondary | ICD-10-CM

## 2023-10-18 LAB — NITRIC OXIDE: Nitric Oxide: 11

## 2023-10-18 MED ORDER — TRELEGY ELLIPTA 100-62.5-25 MCG/ACT IN AEPB: 1.0000 | INHALATION_SPRAY | Freq: Every day | RESPIRATORY_TRACT | 11 refills | Status: DC

## 2023-10-18 MED ORDER — TRELEGY ELLIPTA 100-62.5-25 MCG/ACT IN AEPB: 1.0000 | INHALATION_SPRAY | Freq: Every day | RESPIRATORY_TRACT | Status: AC

## 2023-10-18 NOTE — Progress Notes (Signed)
 Subjective:    Patient ID: Leslie Dixon, female    DOB: 20-May-1946, 78 y.o.   MRN: [id number]  Patient Care Team: Irena Reichmann, DO as PCP - General (Family Medicine) Artis Delay, MD as Consulting Physician (Hematology and Oncology) Esaw Dace, MD as Attending Physician (Urology) Antony Contras, MD as Consulting Physician (Ophthalmology) Julio Sicks, NP as Nurse Practitioner (Obstetrics and Gynecology) Cherlyn Roberts, MD as Consulting Physician (Dermatology) Bud Face, MD as Referring Physician (Otolaryngology) Antony Contras, MD as Consulting Physician (Ophthalmology) Salena Saner, MD as Consulting Physician (Pulmonary Disease)  Chief Complaint  Patient presents with   Follow-up    Cough and wheezing. No shortness of breath.     BACKGROUND/INTERVAL:Leslie Dixon is an 78 year old lifelong never smoker with a history of mild intermittent asthma who presents for follow-up.  She was last seen 21 September 2023.  At that time she was treated for acute exacerbation of asthma due to atypical pneumonia.  HPI Discussed the use of AI scribe software for clinical note transcription with the patient, who gave verbal consent to proceed.  History of Present Illness   The patient presents with persistent cough after management for bronchitis and asthma exacerbation.  She has a persistent cough that occurs mostly in the morning, which she manages with a piece of mint for throat irritation. She has a history of pneumonia treated with antibiotics and continues to experience some throat irritation, though she feels she is managing it well.  Cough is nonproductive, no hemoptysis.  She has been using Arnuity for airway inflammation but expresses concern about the cost and the need to switch medications due to formulary changes.  She discusses ongoing leg pain following a fracture, which has been treated with a rod and bolts. She describes significant pain associated with the  iliotibial band and bursitis, managed with Motrin and Biofreeze. The pain was severe initially but has since improved. She mentions difficulty with certain seating arrangements due to the rod in her leg.  She is still getting therapy for this issue.  Overall she has felt improvement since her visit at the end of February.  Chest x-ray obtained 21 September 2023 shows some reticulonodular opacities, potentially atypical infection.  Review of Systems A 10 point review of systems was performed and it is as noted above otherwise negative.   Patient Active Problem List   Diagnosis Date Noted   Closed right hip fracture, initial encounter (HCC) 08/07/2022   Estrogen deficiency 05/02/2022   Mild intermittent asthma without complication 05/29/2019   Rib contusion, right, initial encounter 05/29/2019   Elevated HDL 05/31/2018   Panic attack as reaction to stress 05/31/2018   Lumbar post-laminectomy syndrome 01/24/2018   Gastroesophageal reflux disease 12/28/2017   Chronic neck pain 12/28/2017   Osteoarthritis 12/28/2017   Cervical radiculopathy 10/24/2017   Chronic pain 10/19/2017   DDD (degenerative disc disease), cervical 10/19/2017   Irritable bowel syndrome with diarrhea 09/29/2017   Migraine without status migrainosus, not intractable 09/29/2017   Family history of breast cancer- sister at age 41- new onset 02/23/2017   History of thyroid nodule- R side ( Dr Everardo All eval in 10/17) 02/23/2017   Status post lumbar surgery- foot drop R foot 02/23/2017   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   Right thyroid nodule 05/28/2016   Intolerance to cold 05/02/2016   History of non anemic vitamin B12 deficiency 05/02/2016   Environmental and seasonal allergies 04/20/2016   h/o GAD (had panic in past) 02/12/2016  GERD (gastroesophageal reflux disease)    Osteoporosis    Chronic back pain    Idiopathic benign Leukopenia    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   HYPOKALEMIA 01/03/2008   Allergic  rhinitis due to pollen 12/13/2007   VOCAL CORD DISORDER 06/09/2007   LACTOSE INTOLERANCE 03/07/2007   Ocular migraine 03/07/2007   CARPAL TUNNEL SYNDROME 03/07/2007   VARICOSE VEIN 03/07/2007   NEPHROLITHIASIS, HX OF 03/07/2007    Social History   Tobacco Use   Smoking status: Never   Smokeless tobacco: Never  Substance Use Topics   Alcohol use: No    Allergies  Allergen Reactions   Peanut Oil Anaphylaxis   Singulair [Montelukast]     Facial swelling   Aspartame And Phenylalanine    Clarithromycin     REACTION: GI   Codeine Other (See Comments)    REACTION: nausea and vomiting   Lactose     Other reaction(s): GI Upset (intolerance)   Meloxicam Other (See Comments)    Face swelling   Other     GLUTEN RESTRICTED LACTOSE INTOLERANT ALLERGIC TO NUTS AND CORN   Penicillins     REACTION: per allergy testing/Patient states she has taken Augmentin without complications.    Pregabalin     REACTION: headache, several side effects   Sulfa Antibiotics Other (See Comments)    REACTION: Hives, wheezing   Sulfonamide Derivatives     REACTION: Hives, wheezing   Cefdinir Rash   Glycerin Other (See Comments)    Migraines, eye problems    Current Meds  Medication Sig   albuterol (VENTOLIN HFA) 108 (90 Base) MCG/ACT inhaler Inhale 2 puffs into the lungs every 6 (six) hours as needed for wheezing or shortness of breath.   Cholecalciferol (VITAMIN D-3) 125 MCG (5000 UT) TABS Take 1 tablet by mouth daily.   Fluticasone-Umeclidin-Vilant (TRELEGY ELLIPTA) 100-62.5-25 MCG/ACT AEPB Inhale 1 puff into the lungs daily.   Fluticasone-Umeclidin-Vilant (TRELEGY ELLIPTA) 100-62.5-25 MCG/ACT AEPB Inhale 1 Inhalation into the lungs daily in the afternoon for 14 days.   loratadine (CLARITIN) 10 MG tablet Take 10 mg by mouth daily.   magnesium (MAGTAB) 84 MG ( ) TBCR SR tablet Take 250 mg by mouth.   omeprazole (PRILOSEC) 20 MG capsule Take 1 capsule (20 mg total) by mouth daily.    triamcinolone (NASACORT) 55 MCG/ACT AERO nasal inhaler Place 2 sprays into the nose daily as needed (allergies).   vitamin B-12 (CYANOCOBALAMIN) 1000 MCG tablet Take 1,000 mcg by mouth once a week.   vitamin E 400 UNIT capsule Take 400 Units by mouth in the morning and at bedtime.   [DISCONTINUED] fluticasone (FLOVENT HFA) 110 MCG/ACT inhaler Inhale 2 puffs into the lungs 2 (two) times daily. Rinse mouth after use.   [DISCONTINUED] Fluticasone Furoate (ARNUITY ELLIPTA) 100 MCG/ACT AEPB Inhale 1 puff into the lungs daily.    Immunization History  Administered Date(s) Administered   DT (Pediatric) 08/17/2010   DTaP 08/17/2010   Fluad Quad(high Dose 65+) 04/25/2019   Influenza Whole 04/30/2004, 04/26/2007   Influenza, High Dose Seasonal PF 05/13/2016, 05/10/2017, 04/25/2018, 04/15/2020   Influenza, Quadrivalent, Recombinant, Inj, Pf 05/05/2021   Influenza, Seasonal, Injecte, Preservative Fre 04/27/2023   Influenza,inj,quad, With Preservative 04/25/2017   Influenza-Unspecified 06/07/2011, 04/24/2012, 05/26/2022   PFIZER(Purple Top)SARS-COV-2 Vaccination 08/13/2019, 09/06/2019, 05/16/2020   Pneumococcal Conjugate-13 04/20/2016   Pneumococcal Polysaccharide-23 04/30/2004, 07/29/2008, 07/27/2010, 04/20/2016   Td 07/26/2002, 11/19/2003   Tdap 08/17/2010, 04/20/2016   Zoster Recombinant(Shingrix) 04/25/2018, 09/11/2018   Zoster, Live 07/27/2007  Objective:     BP 102/60 (BP Location: Left Arm, Patient Position: Sitting, Cuff Size: Normal)   Pulse 79   Temp 97.6 F (36.4 C) (Temporal)   Ht 5\' 6"  (1.676 m)   Wt 145 lb 12.8 oz (66.1 kg)   SpO2 96%   BMI 23.53 kg/m   SpO2: 96 %  GENERAL: Well-developed well-nourished woman in no acute distress.Fully ambulatory.No conversational dyspnea.  Very nasal quality to speech. HEAD: Normocephalic, atraumatic. EYES: Pupils equal, round, reactive to light.  No scleral icterus. MOUTH: Teeth intact, oral mucosa moist.  No thrush. NECK:  Supple. No thyromegaly. Trachea midline. No JVD.  No adenopathy. PULMONARY: Good air entry bilaterally.  No adventitious sounds. CARDIOVASCULAR: S1 and S2. Regular rate and rhythm.  No rubs, murmurs or gallops heard. ABDOMEN: Benign. MUSCULOSKELETAL: No joint deformity, no clubbing, no edema. NEUROLOGIC: No focal deficits, no gait disturbance, speech is fluent. SKIN: Intact,warm,dry.  Limited exam: No rashes. PSYCH: Mood and behavior normal  Lab Results  Component Value Date   NITRICOXIDE 11 10/18/2023  *No evidence of type II inflammation  Chest x-ray from 21 September 2023, independently reviewed:   Assessment & Plan:     ICD-10-CM   1. Moderate persistent asthma without complication  J45.40 CT Chest High Resolution    Nitric oxide    2. Laryngopharyngeal reflux (LPR)  K21.9     3. Perennial allergic rhinitis with seasonal variation  J30.89    J30.2     4. Opacities of both lungs present on chest x-ray  R91.8 CT Chest High Resolution      Orders Placed This Encounter  Procedures   CT Chest High Resolution    Standing Status:   Future    Expected Date:   11/18/2023    Expiration Date:   10/17/2024    Preferred imaging location?:   [facility]   Nitric oxide    Meds ordered this encounter  Medications   Fluticasone-Umeclidin-Vilant (TRELEGY ELLIPTA) 100-62.5-25 MCG/ACT AEPB    Sig: Inhale 1 puff into the lungs daily.    Dispense:  60 each    Refill:  11   Fluticasone-Umeclidin-Vilant (TRELEGY ELLIPTA) 100-62.5-25 MCG/ACT AEPB    Sig: Inhale 1 Inhalation into the lungs daily in the afternoon for 14 days.    Lot Number?:   [id number]    Expiration Date?:   01/23/2025    Manufacturer?:   [employer] [[id number]]    NDC:   4132-4401-02 [725366]    Quantity:   1   Discussion:    Moderate persistent asthma Moderate persistent asthma previously managed with Arnuity, now switching to Trelegy due to formulary changes. Trelegy offers lower copay, with good airway inflammation  control expected. - Switch to Trelegy. - Provide two weeks of Trelegy samples. - Send prescription for Trelegy to [facility]. - Instruct to rinse mouth after using Trelegy to prevent oral thrush.  Pneumonia Suspected walking pneumonia with patchy infiltrates on chest x-ray. Symptoms improved with antibiotics, but residual morning cough persists. High-resolution chest CT recommended to rule out other underlying issues. - Order high-resolution chest CT.  Iliotibial Band Syndrome with Bursitis Pain in iliotibial band region, exacerbated by activity. Managed with Motrin and Biofreeze. Advised gradual activity increase to prevent exacerbation. - Continue Motrin and Biofreeze. - Advise gradual increase in activity levels.  Follow-up Follow-up needed to assess response to new asthma medication and overall health status. - Schedule follow-up appointment in three months.       Advised if symptoms  do not improve or worsen, to please contact office for sooner follow up or seek emergency care.    I spent 32 minutes of dedicated to the care of this patient on the date of this encounter to include pre-visit review of records, face-to-face time with the patient discussing conditions above, post visit ordering of testing, clinical documentation with the electronic health record, making appropriate referrals as documented, and communicating necessary findings to members of the patients care team.     C. Danice Goltz, MD Advanced Bronchoscopy PCCM [facility]    *This note was generated using voice recognition software/Dragon and/or AI transcription program.  Despite best efforts to proofread, errors can occur which can change the meaning. Any transcriptional errors that result from this process are unintentional and may not be fully corrected at the time of dictation.

## 2023-10-18 NOTE — Patient Instructions (Signed)
 VISIT SUMMARY:  During today's visit, we discussed your persistent cough and leg pain following a fracture. We reviewed your current medications and made some adjustments to better manage your symptoms. We also planned further imaging to investigate your ongoing cough and provided recommendations for managing your leg pain.  YOUR PLAN:  -CHRONIC OBSTRUCTIVE PULMONARY DISEASE (COPD): COPD is a chronic lung disease that causes breathing difficulties. We are switching your medication from Arnuity to Trelegy due to formulary changes. Trelegy should help control your airway inflammation effectively. Please use the provided samples for two weeks and then continue with the prescription sent to [facility]. Remember to rinse your mouth after using Trelegy to prevent oral thrush.  -PNEUMONIA: Pneumonia is an infection that inflames the air sacs in one or both lungs. Although your symptoms have improved with antibiotics, you still have a morning cough. We are ordering a high-resolution chest CT to rule out any other underlying issues.  -ILIOTIBIAL BAND SYNDROME WITH BURSITIS: This condition involves pain and inflammation in the iliotibial band, a ligament that runs along the outside of the thigh. It is often exacerbated by activity. Continue using Motrin and Biofreeze for pain management and gradually increase your activity levels to avoid worsening the pain.  INSTRUCTIONS:  Please schedule a follow-up appointment in three months to assess your response to the new COPD medication and your overall health status.

## 2023-11-01 ENCOUNTER — Encounter: Payer: Self-pay | Admitting: Pulmonary Disease

## 2023-11-07 DIAGNOSIS — R195 Other fecal abnormalities: Secondary | ICD-10-CM | POA: Diagnosis not present

## 2023-11-17 ENCOUNTER — Telehealth: Payer: Self-pay | Admitting: Pulmonary Disease

## 2023-11-17 DIAGNOSIS — J4541 Moderate persistent asthma with (acute) exacerbation: Secondary | ICD-10-CM

## 2023-11-17 MED ORDER — DOXYCYCLINE HYCLATE 100 MG PO TABS: 100.0000 mg | ORAL_TABLET | Freq: Two times a day (BID) | ORAL | 0 refills | Status: AC

## 2023-11-17 MED ORDER — METHYLPREDNISOLONE 4 MG PO TBPK: ORAL_TABLET | ORAL | 0 refills | Status: DC

## 2023-11-17 NOTE — Telephone Encounter (Signed)
 Lets reschedule the CT for a week or 2 from now.  The CT is important for me to know what is going on.  I will send in a prescription for some medications for her to her pharmacy.  Continue using Mucinex for now.  That is not going to harm her.

## 2023-11-17 NOTE — Telephone Encounter (Signed)
 Patient was seen on 10/18/23. She was given Trelegy. The patient has a lot of chest congestion. She has been using the inhaler and taking mucinex it gets some better but goes right back to the same. We have CXL her CT appt for 11/18/23 due to chest congestion. Patient wants to know what else she should do.

## 2023-11-17 NOTE — Telephone Encounter (Signed)
 Medications were sent to the pharmacy requested.

## 2023-11-17 NOTE — Telephone Encounter (Signed)
 I have notified the patient. Nothing further needed.

## 2023-11-17 NOTE — Telephone Encounter (Signed)
 I have spoke with the patient and her CT has been reschedule for 12/02/23. She wanted to make sure the medication was sent to [facility] on [address]

## 2023-11-18 ENCOUNTER — Ambulatory Visit (HOSPITAL_COMMUNITY)

## 2023-11-23 ENCOUNTER — Ambulatory Visit: Admitting: Gastroenterology

## 2023-12-02 ENCOUNTER — Ambulatory Visit (HOSPITAL_COMMUNITY)
Admission: RE | Admit: 2023-12-02 | Discharge: 2023-12-02 | Disposition: A | Discharge status: Home / Self Care | Source: Ambulatory Visit | Attending: Family Medicine | Admitting: Family Medicine

## 2023-12-02 DIAGNOSIS — J4540 Moderate persistent asthma, uncomplicated: Secondary | ICD-10-CM | POA: Diagnosis not present

## 2023-12-02 DIAGNOSIS — R918 Other nonspecific abnormal finding of lung field: Secondary | ICD-10-CM | POA: Diagnosis not present

## 2023-12-02 DIAGNOSIS — J849 Interstitial pulmonary disease, unspecified: Secondary | ICD-10-CM | POA: Diagnosis not present

## 2023-12-06 ENCOUNTER — Ambulatory Visit: Payer: Self-pay | Admitting: Pulmonary Disease

## 2023-12-22 DIAGNOSIS — H6121 Impacted cerumen, right ear: Secondary | ICD-10-CM | POA: Diagnosis not present

## 2023-12-22 DIAGNOSIS — J301 Allergic rhinitis due to pollen: Secondary | ICD-10-CM | POA: Diagnosis not present

## 2023-12-22 DIAGNOSIS — J320 Chronic maxillary sinusitis: Secondary | ICD-10-CM | POA: Diagnosis not present

## 2024-01-09 DIAGNOSIS — K219 Gastro-esophageal reflux disease without esophagitis: Secondary | ICD-10-CM | POA: Diagnosis not present

## 2024-01-09 DIAGNOSIS — G43109 Migraine with aura, not intractable, without status migrainosus: Secondary | ICD-10-CM | POA: Diagnosis not present

## 2024-01-16 ENCOUNTER — Other Ambulatory Visit: Payer: Self-pay | Admitting: Family Medicine

## 2024-01-16 DIAGNOSIS — Z1231 Encounter for screening mammogram for malignant neoplasm of breast: Secondary | ICD-10-CM

## 2024-01-19 ENCOUNTER — Ambulatory Visit (INDEPENDENT_AMBULATORY_CARE_PROVIDER_SITE_OTHER): Admitting: Pulmonary Disease

## 2024-01-19 ENCOUNTER — Encounter: Payer: Self-pay | Admitting: Pulmonary Disease

## 2024-01-19 VITALS — BP 106/60 | HR 72 | Temp 98.4°F | Ht 66.0 in | Wt 143.0 lb

## 2024-01-19 DIAGNOSIS — R0600 Dyspnea, unspecified: Secondary | ICD-10-CM

## 2024-01-19 DIAGNOSIS — J4540 Moderate persistent asthma, uncomplicated: Secondary | ICD-10-CM | POA: Diagnosis not present

## 2024-01-19 DIAGNOSIS — K219 Gastro-esophageal reflux disease without esophagitis: Secondary | ICD-10-CM

## 2024-01-19 DIAGNOSIS — I517 Cardiomegaly: Secondary | ICD-10-CM

## 2024-01-19 DIAGNOSIS — J849 Interstitial pulmonary disease, unspecified: Secondary | ICD-10-CM | POA: Diagnosis not present

## 2024-01-19 MED ORDER — FLUTICASONE PROPIONATE HFA 110 MCG/ACT IN AERO: 2.0000 | INHALATION_SPRAY | Freq: Two times a day (BID) | RESPIRATORY_TRACT | 4 refills | Status: DC

## 2024-01-19 NOTE — Progress Notes (Signed)
 Subjective:    Patient ID: Leslie Dixon, female    DOB: 1946/02/06, 78 y.o.   MRN: [id number]  Patient Care Team: Gerome Brunet, DO as PCP - General (Family Medicine) Lonn Hicks, MD as Consulting Physician (Hematology and Oncology) Nicholaus Tanda LITTIE DOUGLAS, MD as Attending Physician (Urology) Charmayne Molly, MD as Consulting Physician (Ophthalmology) Tod Ivanoff, NP as Nurse Practitioner (Obstetrics and Gynecology) Ivin Kocher, MD as Consulting Physician (Dermatology) Milissa Hamming, MD as Referring Physician (Otolaryngology) Charmayne Molly, MD as Consulting Physician (Ophthalmology) Tamea Dedra LITTIE, MD as Consulting Physician (Pulmonary Disease)  Chief Complaint  Patient presents with   Follow-up    BACKGROUND/INTERVAL:Haelie is an 78 year old lifelong never smoker with a history of moderate persistent asthma who presents for follow-up.  She was last seen 18 October 2023.  At that time she was following up after an acute exacerbation of asthma due to atypical pneumonia.   HPI Discussed the use of AI scribe software for clinical note transcription with the patient, who gave verbal consent to proceed.  History of Present Illness   Leslie Dixon is an 78 year old female with persistent asthma and recently noted interstitial lung disease who presents with difficulty breathing.  She experiences significant difficulty breathing, particularly when outdoors, which is exacerbated by environmental factors. Her asthma has been managed with Arnuity.  At her prior visit she was given a trial of Trelegy however she had difficulties with the inhaler due to making her reflux worse.  Patient also noted excessive amounts of powder coming out of the inhaler even after administration.  She resumed her Arnuity use without issue.  Recall that Arnuity had to be switched to due to noncoverage by her insurance company.  Her daughter confirmed that the Trelegy inhaler was not functioning properly. She  uses albuterol  sparingly due to side effects of jitteriness.  She was informed of having interstitial lung disease with mild scarring on her lungs from recent imaging. She recalls a past episode of walking pneumonia and takes precautions against potential irritants, such as wearing a mask while gardening. She experiences skin irritation when handling cucumbers, which she manages by washing her arms promptly.  Her current medications include Arnuity, Prilosec 20 mg in the morning, and famotidine  as needed for reflux. She also uses lidocaine  patches and biofreeze for pain management related to her IT band issues and has discontinued Motrin due to its impact on her reflux.  She mentions a past liver enzyme test that returned normal results and recalls a recent scan mentioning something about her liver (we discussed that mild cirrhosis was noted). She uses apple cider vinegar in water  as a detox method, though she finds it difficult to tolerate at times.  We discussed avoiding this due to further issues with her reflux.  High-resolution CT chest obtained on 02 Dec 2023 was independently reviewed and reviewed with the patient.  The findings may be related to fibrotic nonspecific interstitial pneumonitis and currently not fitting UIP criteria.  The liver was noted to be mildly cirrhotic.  There was also enlarged pulmonary trunk and heart.    Review of Systems A 10 point review of systems was performed and it is as noted above otherwise negative.   Patient Active Problem List   Diagnosis Date Noted   Closed right hip fracture, initial encounter (HCC) 08/07/2022   Estrogen deficiency 05/02/2022   Mild intermittent asthma without complication 05/29/2019   Rib contusion, right, initial encounter 05/29/2019   Elevated HDL 05/31/2018  Panic attack as reaction to stress 05/31/2018   Lumbar post-laminectomy syndrome 01/24/2018   Gastroesophageal reflux disease 12/28/2017   Chronic neck pain 12/28/2017    Osteoarthritis 12/28/2017   Cervical radiculopathy 10/24/2017   Chronic pain 10/19/2017   DDD (degenerative disc disease), cervical 10/19/2017   Irritable bowel syndrome with diarrhea 09/29/2017   Migraine without status migrainosus, not intractable 09/29/2017   Family history of breast cancer- sister at age 53- new onset 02/23/2017   History of thyroid  nodule- R side ( Dr Kassie eval in 10/17) 02/23/2017   Status post lumbar surgery- foot drop R foot 02/23/2017   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   Right thyroid  nodule 05/28/2016   Intolerance to cold 05/02/2016   History of non anemic vitamin B12 deficiency 05/02/2016   Environmental and seasonal allergies 04/20/2016   h/o GAD (had panic in past) 02/12/2016   GERD (gastroesophageal reflux disease)    Osteoporosis    Chronic back pain    Idiopathic benign Leukopenia    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   HYPOKALEMIA 01/03/2008   Allergic rhinitis due to pollen 12/13/2007   VOCAL CORD DISORDER 06/09/2007   LACTOSE INTOLERANCE 03/07/2007   Ocular migraine 03/07/2007   CARPAL TUNNEL SYNDROME 03/07/2007   VARICOSE VEIN 03/07/2007   NEPHROLITHIASIS, HX OF 03/07/2007    Social History   Tobacco Use   Smoking status: Never   Smokeless tobacco: Never  Substance Use Topics   Alcohol  use: No    Allergies  Allergen Reactions   Peanut Oil Anaphylaxis   Singulair  [Montelukast ]     Facial swelling   Aspartame And Phenylalanine    Clarithromycin     REACTION: GI   Codeine Other (See Comments)    REACTION: nausea and vomiting   Lactose     Other reaction(s): GI Upset (intolerance)   Meloxicam  Other (See Comments)    Face swelling   Other     GLUTEN RESTRICTED LACTOSE INTOLERANT ALLERGIC TO NUTS AND CORN   Penicillins     REACTION: per allergy testing/Patient states she has taken Augmentin  without complications.    Pregabalin     REACTION: headache, several side effects   Sulfa Antibiotics Other (See Comments)     REACTION: Hives, wheezing   Sulfonamide Derivatives     REACTION: Hives, wheezing   Cefdinir  Rash   Glycerin Other (See Comments)    Migraines, eye problems    Current Meds  Medication Sig   albuterol  (VENTOLIN  HFA) 108 (90 Base) MCG/ACT inhaler Inhale 2 puffs into the lungs every 6 (six) hours as needed for wheezing or shortness of breath.   Cholecalciferol (VITAMIN D -3) 125 MCG (5000 UT) TABS Take 1 tablet by mouth daily.   fluticasone  (FLOVENT  HFA) 110 MCG/ACT inhaler Inhale 2 puffs into the lungs 2 (two) times daily.   loratadine  (CLARITIN ) 10 MG tablet Take 10 mg by mouth daily.   magnesium (MAGTAB) 84 MG ( ) TBCR SR tablet Take 250 mg by mouth.   omeprazole  (PRILOSEC) 20 MG capsule Take 1 capsule (20 mg total) by mouth daily.   triamcinolone  (NASACORT ) 55 MCG/ACT AERO nasal inhaler Place 2 sprays into the nose daily as needed (allergies).   vitamin B-12 (CYANOCOBALAMIN ) 1000 MCG tablet Take 1,000 mcg by mouth once a week.   vitamin E 400 UNIT capsule Take 400 Units by mouth in the morning and at bedtime.   [DISCONTINUED] Fluticasone  Furoate (ARNUITY ELLIPTA ) 100 MCG/ACT AEPB Inhale 1 Inhalation into the lungs daily in the  afternoon.    Immunization History  Administered Date(s) Administered   DT (Pediatric) 08/17/2010   DTaP 08/17/2010   Fluad Quad(high Dose 65+) 04/25/2019   Influenza Whole 04/30/2004, 04/26/2007   Influenza, High Dose Seasonal PF 05/13/2016, 05/10/2017, 04/25/2018, 04/15/2020   Influenza, Quadrivalent, Recombinant, Inj, Pf 05/05/2021   Influenza, Seasonal, Injecte, Preservative Fre 04/27/2023   Influenza,inj,quad, With Preservative 04/25/2017   Influenza-Unspecified 06/07/2011, 04/24/2012, 05/26/2022   PFIZER(Purple Top)SARS-COV-2 Vaccination 08/13/2019, 09/06/2019, 05/16/2020   PNEUMOCOCCAL CONJUGATE-20 07/22/2021   Pneumococcal Conjugate-13 04/20/2016   Pneumococcal Polysaccharide-23 04/30/2004, 07/29/2008, 07/27/2010, 04/20/2016   Td 07/26/2002,  11/19/2003   Tdap 08/17/2010, 04/20/2016   Zoster Recombinant(Shingrix ) 04/25/2018, 09/11/2018   Zoster, Live 07/27/2007        Objective:     BP 106/60 (BP Location: Left Arm, Patient Position: Sitting, Cuff Size: Normal)   Pulse 72   Temp 98.4 F (36.9 C) (Oral)   Ht 5' 6 (1.676 m)   Wt 143 lb (64.9 kg)   SpO2 98%   BMI 23.08 kg/m   SpO2: 98 %  GENERAL: Well-developed well-nourished woman in no acute distress.Fully ambulatory.No conversational dyspnea.  HEAD: Normocephalic, atraumatic. EYES: Pupils equal, round, reactive to light.  No scleral icterus. MOUTH: Teeth intact, oral mucosa moist.  No thrush. NECK: Supple. No thyromegaly. Trachea midline. No JVD.  No adenopathy. PULMONARY: Good air entry bilaterally.  No adventitious sounds. CARDIOVASCULAR: S1 and S2. Regular rate and rhythm.  No rubs, murmurs or gallops heard. ABDOMEN: Benign. MUSCULOSKELETAL: No joint deformity, no clubbing, no edema. NEUROLOGIC: No focal deficits, no gait disturbance, speech is fluent. SKIN: Intact,warm,dry.  Limited exam: No rashes. PSYCH: Mood and behavior normal   Assessment & Plan:     ICD-10-CM   1. ILD (interstitial lung disease) (HCC)  J84.9 Pulmonary function test    6 minute walk    2. Moderate persistent asthma without complication  J45.40 Pulmonary function test    3. Laryngopharyngeal reflux (LPR)  K21.9     4. Cardiomegaly  I51.7     5. Dyspnea, unspecified type  R06.00 ECHOCARDIOGRAM COMPLETE    6 minute walk      Orders Placed This Encounter  Procedures   ECHOCARDIOGRAM COMPLETE    Standing Status:   Future    Expected Date:   02/18/2024    Expiration Date:   01/18/2025    Where should this test be performed:   [facility]    Please indicate who you request to read the nuc med / echo results.:   [facility]    Perflutren DEFINITY (image enhancing agent) should be administered unless hypersensitivity or allergy exist:   Administer Perflutren     Reason for exam-Echo:   Pulmonary hypertension I27.2   Pulmonary function test    Standing Status:   Future    Expected Date:   02/18/2024    Expiration Date:   01/18/2025    Where should this test be performed?:   [facility]    What type of PFT is being ordered?:   Full PFT   6 minute walk    Standing Status:   Future    Expected Date:   02/18/2024    Expiration Date:   01/18/2025    Where should this test be performed?:   [facility]    Meds ordered this encounter  Medications   fluticasone  (FLOVENT  HFA) 110 MCG/ACT inhaler    Sig: Inhale 2 puffs into the lungs 2 (two) times daily.  Dispense:  3 each    Refill:  4   Discussion:    Persistent asthma Persistent asthma with difficulty breathing exacerbated by environmental factors. Previous issues with Trelegy due to reflux.  Patient now back on Arnuity however, not covered by insurance.  Albuterol  used sparingly due to side effects. Insurance coverage issues with inhalers. Transition to Flovent  inhaler due to insurance coverage and delivery method. - Prescribe Flovent  110 mcg inhaler, 2 puffs twice a day, with a spacer to improve delivery to the lungs - Educate on the use of the spacer to ensure proper inhalation technique  Mild interstitial lung disease (ILD) Mild interstitial lung disease with scarring noted on imaging, does not fit UIP pattern.  Query fibrotic NSIP. No acute changes. Baseline tests needed to monitor progression. - Schedule pulmonary function test to assess lung function - Order 6-minute walk test to evaluate exercise tolerance - Plan for repeat imaging scan in May 2026 year to monitor lung changes - Evaluate heart function to rule out elevated pulmonary artery pressure (cardiomegaly and increased size of pulmonary trunk noted on CT chest)  Gastroesophageal reflux disease (GERD) GERD managed with Prilosec and famotidine . Symptoms include nighttime reflux. Avoidance of certain foods and medications like Motrin  due to exacerbation of reflux symptoms. - Continue Prilosec 20 mg in the morning - Use famotidine  as needed for nighttime symptoms - Avoid Motrin to prevent exacerbation of reflux     Advised if symptoms do not improve or worsen, to please contact office for sooner follow up or seek emergency care.    I spent 40 minutes of dedicated to the care of this patient on the date of this encounter to include pre-visit review of records, face-to-face time with the patient discussing conditions above, post visit ordering of testing, clinical documentation with the electronic health record, making appropriate referrals as documented, and communicating necessary findings to members of the patients care team.     C. Leita Sanders, MD Advanced Bronchoscopy P[facility]    *This note was generated using voice recognition software/Dragon and/or AI transcription program.  Despite best efforts to proofread, errors can occur which can change the meaning. Any transcriptional errors that result from this process are unintentional and may not be fully corrected at the time of dictation.

## 2024-01-19 NOTE — Patient Instructions (Signed)
 VISIT SUMMARY:  Leslie Dixon, an 78 year old female with persistent asthma and interstitial lung disease, visited today due to difficulty breathing. Her asthma management has been challenging due to issues with her inhaler and side effects from albuterol . She also has mild interstitial lung disease with scarring on her lungs and experiences gastroesophageal reflux disease (GERD).  YOUR PLAN:  -PERSISTENT ASTHMA: Persistent asthma is a long-term condition where your airways become inflamed and narrow, making it hard to breathe. We will switch your inhaler to Flovent , which you should use 2 puffs twice a day with a spacer to help with proper delivery to your lungs. We also provided education on using the spacer correctly.  -MILD INTERSTITIAL LUNG DISEASE (ILD): Mild interstitial lung disease involves scarring of the lung tissue, which can affect your breathing. We will schedule a breathing test and a walking test to assess your lung function and exercise tolerance. Additionally, we plan to repeat your imaging scan in May next year to monitor any changes in your lungs and evaluate your heart function to rule out elevated pulmonary artery pressure.  -GASTROESOPHAGEAL REFLUX DISEASE (GERD): GERD is a condition where stomach acid frequently flows back into the tube connecting your mouth and stomach, causing discomfort. Continue taking Prilosec 20 mg in the morning and use famotidine  as needed for nighttime symptoms. Avoid taking Motrin as it can worsen your reflux.  INSTRUCTIONS:  Please follow up with the scheduled breathing and walking tests to assess your lung function. We will also plan for a repeat imaging scan in May next year to monitor your lung condition. Continue your current medications as discussed and avoid Motrin to prevent worsening of your reflux.

## 2024-01-20 ENCOUNTER — Encounter: Payer: Self-pay | Admitting: Pulmonary Disease

## 2024-02-07 ENCOUNTER — Ambulatory Visit (HOSPITAL_COMMUNITY)
Admission: RE | Admit: 2024-02-07 | Discharge: 2024-02-07 | Disposition: A | Discharge status: Home / Self Care | Source: Ambulatory Visit | Attending: Pulmonary Disease | Admitting: Pulmonary Disease

## 2024-02-07 DIAGNOSIS — I10 Essential (primary) hypertension: Secondary | ICD-10-CM | POA: Diagnosis not present

## 2024-02-07 DIAGNOSIS — I272 Pulmonary hypertension, unspecified: Secondary | ICD-10-CM | POA: Diagnosis not present

## 2024-02-07 DIAGNOSIS — R0600 Dyspnea, unspecified: Secondary | ICD-10-CM | POA: Diagnosis not present

## 2024-02-07 LAB — ECHOCARDIOGRAM COMPLETE
Area-P 1/2: 3.37 cm2
S' Lateral: 2.7 cm

## 2024-02-07 NOTE — Progress Notes (Signed)
  Echocardiogram 2D Echocardiogram has been performed.  Koleen KANDICE Popper, RDCS 02/07/2024, 10:30 AM

## 2024-03-08 ENCOUNTER — Telehealth: Payer: Self-pay | Admitting: Pulmonary Disease

## 2024-03-08 NOTE — Telephone Encounter (Signed)
 Patient would like to know if she needs 6 minute walk test. States not having any breathing issues. Patient phone number is [phone].

## 2024-03-08 NOTE — Telephone Encounter (Signed)
 I have notified the patient and scheduled her 6 min walk for 9/9 at 1:30pm.  Nothing further needed.

## 2024-04-02 DIAGNOSIS — F439 Reaction to severe stress, unspecified: Secondary | ICD-10-CM | POA: Diagnosis not present

## 2024-04-02 DIAGNOSIS — F419 Anxiety disorder, unspecified: Secondary | ICD-10-CM | POA: Diagnosis not present

## 2024-04-03 ENCOUNTER — Ambulatory Visit

## 2024-04-06 ENCOUNTER — Encounter

## 2024-04-06 ENCOUNTER — Ambulatory Visit: Admitting: Pulmonary Disease

## 2024-04-16 ENCOUNTER — Ambulatory Visit (INDEPENDENT_AMBULATORY_CARE_PROVIDER_SITE_OTHER)

## 2024-04-16 DIAGNOSIS — R0600 Dyspnea, unspecified: Secondary | ICD-10-CM

## 2024-04-16 DIAGNOSIS — J849 Interstitial pulmonary disease, unspecified: Secondary | ICD-10-CM | POA: Diagnosis not present

## 2024-04-16 NOTE — Progress Notes (Signed)
 Patient was seen in the office for a 6 min walk.

## 2024-04-18 ENCOUNTER — Ambulatory Visit (INDEPENDENT_AMBULATORY_CARE_PROVIDER_SITE_OTHER): Admitting: Pulmonary Disease

## 2024-04-18 DIAGNOSIS — J4540 Moderate persistent asthma, uncomplicated: Secondary | ICD-10-CM

## 2024-04-18 DIAGNOSIS — J849 Interstitial pulmonary disease, unspecified: Secondary | ICD-10-CM

## 2024-04-18 LAB — PULMONARY FUNCTION TEST
DL/VA % pred: 80 %
DL/VA: 3.29 ml/min/mmHg/L
DLCO unc % pred: 71 %
DLCO unc: 14.08 ml/min/mmHg
FEF 25-75 Pre: 2.09 L/s
FEF2575-%Pred-Pre: 136 %
FEV1-%Pred-Pre: 102 %
FEV1-Pre: 2.18 L
FEV1FVC-%Pred-Pre: 107 %
FEV6-%Pred-Pre: 100 %
FEV6-Pre: 2.72 L
FEV6FVC-%Pred-Pre: 105 %
FVC-%Pred-Pre: 95 %
FVC-Pre: 2.74 L
Pre FEV1/FVC ratio: 80 %
Pre FEV6/FVC Ratio: 100 %

## 2024-04-18 NOTE — Progress Notes (Signed)
PFT completed today.  

## 2024-04-18 NOTE — Patient Instructions (Signed)
PFT completed today.  

## 2024-04-19 ENCOUNTER — Ambulatory Visit
Admission: RE | Admit: 2024-04-19 | Discharge: 2024-04-19 | Disposition: A | Discharge status: Home / Self Care | Source: Ambulatory Visit | Attending: Family Medicine | Admitting: Family Medicine

## 2024-04-19 ENCOUNTER — Ambulatory Visit

## 2024-04-19 DIAGNOSIS — Z1231 Encounter for screening mammogram for malignant neoplasm of breast: Secondary | ICD-10-CM

## 2024-04-20 ENCOUNTER — Ambulatory Visit: Payer: Self-pay | Admitting: Pulmonary Disease

## 2024-04-20 NOTE — Progress Notes (Signed)
 LMTCB. E2C2 please advise when patient calls back.

## 2024-04-23 DIAGNOSIS — H2511 Age-related nuclear cataract, right eye: Secondary | ICD-10-CM | POA: Diagnosis not present

## 2024-04-23 DIAGNOSIS — H2012 Chronic iridocyclitis, left eye: Secondary | ICD-10-CM | POA: Diagnosis not present

## 2024-04-23 DIAGNOSIS — Z961 Presence of intraocular lens: Secondary | ICD-10-CM | POA: Diagnosis not present

## 2024-04-23 DIAGNOSIS — H25011 Cortical age-related cataract, right eye: Secondary | ICD-10-CM | POA: Diagnosis not present

## 2024-04-23 DIAGNOSIS — H52202 Unspecified astigmatism, left eye: Secondary | ICD-10-CM | POA: Diagnosis not present

## 2024-04-24 NOTE — Telephone Encounter (Signed)
 Copied from CRM [id number]. Topic: Clinical - Lab/Test Results >> Apr 24, 2024 10:33 AM Lavanda D wrote: Reason for CRM: Patient is returning a call from [place] regarding her most recent breathing tests results, relayed Dr. Evalyn message about her results - Patient said no questions at this time.

## 2024-04-24 NOTE — Telephone Encounter (Signed)
 Lm x2 for the patient.

## 2024-04-24 NOTE — Telephone Encounter (Signed)
-----   Message from Dedra Sanders sent at 04/20/2024  2:17 PM EDT ----- Her breathing test and 6-minute walk show that her lung function is well-preserved.  This is good news. ----- Message ----- From: Interface, Lab In Three Zero One Sent: 04/18/2024   2:36 PM EDT To: Dedra LITTIE Sanders, MD

## 2024-04-30 ENCOUNTER — Ambulatory Visit

## 2024-05-01 DIAGNOSIS — F439 Reaction to severe stress, unspecified: Secondary | ICD-10-CM | POA: Diagnosis not present

## 2024-05-01 DIAGNOSIS — F411 Generalized anxiety disorder: Secondary | ICD-10-CM | POA: Diagnosis not present

## 2024-05-01 NOTE — Progress Notes (Signed)
    Assessment & Plan:  1. Mild intermittent asthma without complication (Primary) - Pulmonary Function Test [facility] Only; Future  2. Perennial allergic rhinitis with seasonal variation  3. Laryngopharyngeal reflux (LPR)   Patient Instructions  Your lungs sound clear today.  Doing good job with your nasal hygiene I do not think you need any steroids or antibiotics currently.  However, if you develop fever or green looking nasal drainage let us  now.  Schedule breathing tests to evaluate your asthma.   We will see you in follow-up in 3 months time call sooner should any new difficulties arise.  Please note: late entry documentation due to logistical difficulties during COVID-19 pandemic. This note is filed for information purposes only, and is not intended to be used for billing, nor does it represent the full scope/nature of the visit in question. Please see any associated scanned media linked to date of encounter for additional pertinent information.  Subjective:    HPI: Leslie Dixon is a 78 y.o. female presenting to the pulmonology clinic on 12/26/2019 with report of: Follow-up (Patient states that she was doing good but cough has come back over the last couple weeks. It is a dry cough that is worse during the day. )     Outpatient Encounter Medications as of 12/26/2019  Medication Sig   loratadine  (CLARITIN ) 10 MG tablet Take 10 mg by mouth daily.   vitamin B-12 (CYANOCOBALAMIN ) 1000 MCG tablet Take 1,000 mcg by mouth once a week.   vitamin E 400 UNIT capsule Take 400 Units by mouth in the morning and at bedtime.   [DISCONTINUED] albuterol  (VENTOLIN  HFA) 108 (90 Base) MCG/ACT inhaler Inhale 2 puffs into the lungs every 6 (six) hours as needed for wheezing or shortness of breath.   [DISCONTINUED] doxycycline  (VIBRA -TABS) 100 MG tablet Take 1 tablet (100 mg total) by mouth 2 (two) times daily.   [DISCONTINUED] Fluticasone  Furoate (ARNUITY ELLIPTA ) 100 MCG/ACT AEPB Inhale 1 puff  into the lungs daily.   [DISCONTINUED] meloxicam  (MOBIC ) 7.5 MG tablet Take 1 tablet (7.5 mg total) by mouth daily as needed for pain.   [DISCONTINUED] omeprazole  (PRILOSEC) 20 MG capsule TAKE 1 CAPSULE TWICE A DAY   [DISCONTINUED] SUMAtriptan (IMITREX) 100 MG tablet TAKE 1 2 (ONE HALF) TABLET BY MOUTH AS NEEDED FOR MIGRAINE   [DISCONTINUED] TURMERIC PO Take 2,000 Units by mouth daily at 8 pm. (Patient not taking: Reported on 04/27/2023)   [DISCONTINUED] Vitamin D , Cholecalciferol, 1000 units CAPS Take 1 capsule by mouth daily.    No facility-administered encounter medications on file as of 12/26/2019.      Objective:   Vitals:   12/26/19 0923  BP: 118/60  Pulse: 77  Temp: (!) 97.3 F (36.3 C)  Height: 5' 6.5 (1.689 m)  Weight: 140 lb 9.6 oz (63.8 kg)  SpO2: 97%  TempSrc: Temporal  BMI (Calculated): 22.36     Physical exam documentation is limited by delayed entry of information.

## 2024-05-03 ENCOUNTER — Ambulatory Visit: Admitting: Pulmonary Disease

## 2024-05-03 ENCOUNTER — Encounter: Payer: Self-pay | Admitting: Pulmonary Disease

## 2024-05-03 VITALS — BP 100/60 | HR 76 | Temp 97.9°F | Ht 66.0 in | Wt 141.0 lb

## 2024-05-03 DIAGNOSIS — J4540 Moderate persistent asthma, uncomplicated: Secondary | ICD-10-CM

## 2024-05-03 DIAGNOSIS — Z23 Encounter for immunization: Secondary | ICD-10-CM

## 2024-05-03 DIAGNOSIS — J8410 Pulmonary fibrosis, unspecified: Secondary | ICD-10-CM | POA: Diagnosis not present

## 2024-05-03 NOTE — Patient Instructions (Signed)
 VISIT SUMMARY:  Leslie Dixon, an 77 year old female with interstitial lung disease and asthma, visited for a follow-up on her lung condition. She has a history of asthma, which may have been worsened by a past COVID-19 infection. She uses Flovent  with a spacer for her asthma, which has improved her symptoms. She also discussed a past issue with bursitis in her IT band, which she manages with ice packs and exercise. She enjoys gardening and takes precautions to avoid respiratory irritation. She has not yet received her flu shot this year and prefers the standard dose due to past adverse reactions to the high-dose vaccine.  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 It is non-progressive and not the same as idiopathic pulmonary fibrosis. We will monitor your lung function annually to ensure stability and rule out other conditions.  -ASTHMA: Asthma is a condition where your airways narrow and swell, making it difficult to breathe. Your symptoms may have been worsened by a past COVID-19 infection. Continue using the Flovent  inhaler with a spacer, which has been effective for you. You will also receive the standard dose flu vaccine at your request.  INSTRUCTIONS:  Please schedule an appointment for your annual lung function test. Also, make sure to get your standard dose flu vaccine as discussed.

## 2024-05-03 NOTE — Progress Notes (Signed)
 Subjective:    Patient ID: Leslie Dixon, female    DOB: 27-Jan-1946, 78 y.o.   MRN: [id number]  Patient Care Team: Gerome Brunet, DO as PCP - General (Family Medicine) Lonn Hicks, MD as Consulting Physician (Hematology and Oncology) Nicholaus Tanda LITTIE DOUGLAS, MD as Attending Physician (Urology) Charmayne Molly, MD as Consulting Physician (Ophthalmology) Tod Ivanoff, NP as Nurse Practitioner (Obstetrics and Gynecology) Ivin Kocher, MD as Consulting Physician (Dermatology) Milissa Hamming, MD as Referring Physician (Otolaryngology) Charmayne Molly, MD as Consulting Physician (Ophthalmology) Tamea Dedra LITTIE, MD as Consulting Physician (Pulmonary Disease)  Chief Complaint  Patient presents with   Interstitial Lung Disease    No breathing problems.     BACKGROUND/INTERVAL:Leslie Dixon is an 78 year old lifelong never smoker with a history of moderate persistent asthma and recently noted postinflammatory pulmonary fibrosis who presents for follow-up.  She was last seen 19 January 2024.  Since her last visit she has had PFTs and 6-minute walk.  HPI Discussed the use of AI scribe software for clinical note transcription with the patient, who gave verbal consent to proceed.  History of Present Illness   Leslie Dixon is an 78 year old female with pulmonary fibrosis and asthma who presents for follow-up of her lung condition.  Initially, she experienced significant breathing difficulties, prompting an urgent care visit where COVID-19 was suspected.  She has a history of asthma, which may have been exacerbated by her COVID-19 infection. She uses Flovent  as her inhaler, although she prefers Arnuity due to its convenience. Her insurance does not cover Arnuity, so she uses a spacer with Flovent  to improve its effectiveness. Her voice has become clearer since using the spacer, and she does not experience significant anxiety with the current medication.  She discusses a past issue with her IT band,  diagnosed as bursitis. She experienced significant pain earlier in the year, which was alleviated with an injection. She manages flare-ups with ice packs and avoids heat, which she finds ineffective. She continues to stay active by exercising and riding her bicycle, which she finds beneficial.  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 She enjoys gardening, particularly working on her butterfly garden, and takes precautions such as wearing a mask to avoid respiratory irritation. No cough is present, and she uses a baking soda gargle to manage throat irritation. She has not yet received her flu shot this year and prefers the standard dose due to past adverse reactions to the high-dose vaccine.     We discussed the findings of her high-resolution chest CT, pulmonary function test and 6-minute walk.  Her findings on high-resolution chest CT are mild and likely related to postinflammatory pulmonary fibrosis.  Her PFTs performed on 24 September showed an FEV1 of 2.18 L or 102% predicted, FVC of 2.74 L or 95% predicted, FEV1/FVC of 80% which was normal.  Diffusion minimally reduced.  Overall preserved pulmonary function.  6-minute walk was normal for age she reached 101.7% of expected distance walked no significant oxygen desaturations during the testing.  Testing was performed on 16 April 2024.   Review of Systems A 10 point review of systems was performed and it is as noted above otherwise negative.   Patient Active Problem List   Diagnosis Date Noted   Closed right hip fracture, initial encounter (HCC) 08/07/2022   Estrogen deficiency 05/02/2022   Mild intermittent asthma without complication 05/29/2019   Rib contusion, right, initial encounter 05/29/2019  Elevated HDL 05/31/2018   Panic attack as reaction to stress 05/31/2018   Lumbar post-laminectomy syndrome 01/24/2018   Gastroesophageal reflux disease 12/28/2017   Chronic neck pain  12/28/2017   Osteoarthritis 12/28/2017   Cervical radiculopathy 10/24/2017   Chronic pain 10/19/2017   DDD (degenerative disc disease), cervical 10/19/2017   Irritable bowel syndrome with diarrhea 09/29/2017   Migraine without status migrainosus, not intractable 09/29/2017   Family history of breast cancer- sister at age 54- new onset 02/23/2017   History of thyroid  nodule- R side ( Dr Kassie eval in 10/17) 02/23/2017   Status post lumbar surgery- foot drop R foot 02/23/2017   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   Right thyroid  nodule 05/28/2016   Intolerance to cold 05/02/2016   History of non anemic vitamin B12 deficiency 05/02/2016   Environmental and seasonal allergies 04/20/2016   h/o GAD (had panic in past) 02/12/2016   GERD (gastroesophageal reflux disease)    Osteoporosis    Chronic back pain    Idiopathic benign Leukopenia    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   HYPOKALEMIA 01/03/2008   Allergic rhinitis due to pollen 12/13/2007   VOCAL CORD DISORDER 06/09/2007   LACTOSE INTOLERANCE 03/07/2007   Ocular migraine 03/07/2007   CARPAL TUNNEL SYNDROME 03/07/2007   VARICOSE VEIN 03/07/2007   NEPHROLITHIASIS, HX OF 03/07/2007    Social History   Tobacco Use   Smoking status: Never   Smokeless tobacco: Never  Substance Use Topics   Alcohol  use: No    Allergies  Allergen Reactions   Peanut Oil Anaphylaxis   Singulair  [Montelukast ]     Facial swelling   Aspartame And Phenylalanine    Clarithromycin     REACTION: GI   Codeine Other (See Comments)    REACTION: nausea and vomiting   Lactose     Other reaction(s): GI Upset (intolerance)   Meloxicam  Other (See Comments)    Face swelling   Other     GLUTEN RESTRICTED LACTOSE INTOLERANT ALLERGIC TO NUTS AND CORN   Penicillins     REACTION: per allergy testing/Patient states she has taken Augmentin  without complications.    Pregabalin     REACTION: headache, several side effects   Sulfa Antibiotics Other (See  Comments)    REACTION: Hives, wheezing   Sulfonamide Derivatives     REACTION: Hives, wheezing   Cefdinir  Rash   Glycerin Other (See Comments)    Migraines, eye problems    Current Meds  Medication Sig   albuterol  (VENTOLIN  HFA) 108 (90 Base) MCG/ACT inhaler Inhale 2 puffs into the lungs every 6 (six) hours as needed for wheezing or shortness of breath.   Cholecalciferol (VITAMIN D -3) 125 MCG (5000 UT) TABS Take 1 tablet by mouth daily.   famotidine  (PEPCID ) 20 MG tablet Take 20 mg by mouth daily.   fluticasone  (FLOVENT  HFA) 110 MCG/ACT inhaler Inhale 2 puffs into the lungs 2 (two) times daily.   loratadine  (CLARITIN ) 10 MG tablet Take 10 mg by mouth daily.   omeprazole  (PRILOSEC) 20 MG capsule Take 1 capsule (20 mg total) by mouth daily. (Patient taking differently: Take 20 mg by mouth daily. As needed)   SUMAtriptan (IMITREX) 50 MG tablet Take 50 mg by mouth every 2 (two) hours as needed for migraine.   triamcinolone  (NASACORT ) 55 MCG/ACT AERO nasal inhaler Place 2 sprays into the nose daily as needed (allergies).   vitamin B-12 (CYANOCOBALAMIN ) 1000 MCG tablet Take 1,000 mcg by mouth once a week.   vitamin  E 400 UNIT capsule Take 400 Units by mouth in the morning and at bedtime.    Immunization History  Administered Date(s) Administered   DT (Pediatric) 08/17/2010   DTaP 08/17/2010   Fluad Quad(high Dose 65+) 04/25/2019   INFLUENZA, HIGH DOSE SEASONAL PF 05/13/2016, 05/10/2017, 04/25/2018, 04/15/2020   Influenza Whole 04/30/2004, 04/26/2007   Influenza, Quadrivalent, Recombinant, Inj, Pf 05/05/2021   Influenza, Seasonal, Injecte, Preservative Fre 04/27/2023, 05/03/2024   Influenza,inj,quad, With Preservative 04/25/2017   Influenza-Unspecified 06/07/2011, 04/24/2012, 05/26/2022   PFIZER(Purple Top)SARS-COV-2 Vaccination 08/13/2019, 09/06/2019, 05/16/2020   PNEUMOCOCCAL CONJUGATE-20 07/22/2021   Pneumococcal Conjugate-13 04/20/2016   Pneumococcal Polysaccharide-23 04/30/2004,  07/29/2008, 07/27/2010, 04/20/2016   Td 07/26/2002, 11/19/2003   Tdap 08/17/2010, 04/20/2016   Zoster Recombinant(Shingrix ) 04/25/2018, 09/11/2018   Zoster, Live 07/27/2007        Objective:     BP 100/60   Pulse 76   Temp 97.9 F (36.6 C) (Temporal)   Ht 5' 6 (1.676 m)   Wt 141 lb (64 kg)   SpO2 97%   BMI 22.76 kg/m   SpO2: 97 %  GENERAL: Well-developed well-nourished woman in no acute distress.Fully ambulatory.No conversational dyspnea.  HEAD: Normocephalic, atraumatic. EYES: Pupils equal, round, reactive to light.  No scleral icterus. MOUTH: Teeth intact, oral mucosa moist.  No thrush. NECK: Supple. No thyromegaly. Trachea midline. No JVD.  No adenopathy. PULMONARY: Good air entry bilaterally.  No adventitious sounds. CARDIOVASCULAR: S1 and S2. Regular rate and rhythm.  No rubs, murmurs or gallops heard. ABDOMEN: Benign. MUSCULOSKELETAL: No joint deformity, no clubbing, no edema. NEUROLOGIC: No focal deficits, no gait disturbance, speech is fluent. SKIN: Intact,warm,dry.  Limited exam: No rashes. PSYCH: Mood and behavior normal       Assessment & Plan:     ICD-10-CM   1. Postinflammatory pulmonary fibrosis (HCC)  J84.10     2. Moderate persistent asthma without complication  J45.40     3. Immunization due  Z23 Flu vaccine trivalent PF, 6mos and older(Flulaval,Afluria,Fluarix,Fluzone)      Orders Placed This Encounter  Procedures   Flu vaccine trivalent PF, 6mos and older(Flulaval,Afluria,Fluarix,Fluzone)   Discussion:    Post-inflammatory pulmonary fibrosis secondary to prior COVID-19 The fibrosis is characterized by patchy areas on imaging, indicating scarring from previous inflammation. This type of fibrosis is non-progressive and likely a residual effect from a past COVID-19 infection, which caused significant inflammation in the lungs. It does not fit the criteria for idiopathic pulmonary fibrosis. - Monitor lung function annually to ensure stability  and rule out other conditions.  Asthma Asthma symptoms may have been exacerbated by the prior COVID-19 infection. She reports effective use of the Flovent  inhaler, although she prefers Arnuity for convenience, which is not covered by insurance. Use of a spacer has improved voice clarity and reduced anxiety associated with inhaler use. She has adapted well to the current treatment regimen. - Continue Flovent  inhaler with spacer for asthma management. - Administer standard dose flu vaccine.      Advised if symptoms do not improve or worsen, to please contact office for sooner follow up or seek emergency care.    I spent 34 minutes of dedicated to the care of this patient on the date of this encounter to include pre-visit review of records, face-to-face time with the patient discussing conditions above, post visit ordering of testing, clinical documentation with the electronic health record, making appropriate referrals as documented, and communicating necessary findings to members of the patients care team.  KYM Leita Sanders, MD Advanced Bronchoscopy PCCM [facility]    *This note was generated using voice recognition software/Dragon and/or AI transcription program.  Despite best efforts to proofread, errors can occur which can change the meaning. Any transcriptional errors that result from this process are unintentional and may not be fully corrected at the time of dictation.

## 2024-05-15 DIAGNOSIS — H9113 Presbycusis, bilateral: Secondary | ICD-10-CM | POA: Diagnosis not present

## 2024-05-15 DIAGNOSIS — J329 Chronic sinusitis, unspecified: Secondary | ICD-10-CM | POA: Diagnosis not present

## 2024-05-17 DIAGNOSIS — N1330 Unspecified hydronephrosis: Secondary | ICD-10-CM | POA: Diagnosis not present

## 2024-05-17 DIAGNOSIS — Q631 Lobulated, fused and horseshoe kidney: Secondary | ICD-10-CM | POA: Diagnosis not present

## 2024-05-17 DIAGNOSIS — N200 Calculus of kidney: Secondary | ICD-10-CM | POA: Diagnosis not present

## 2024-06-25 ENCOUNTER — Ambulatory Visit (INDEPENDENT_AMBULATORY_CARE_PROVIDER_SITE_OTHER): Payer: Self-pay | Admitting: Plastic Surgery

## 2024-06-25 VITALS — BP 124/74 | HR 77 | Ht 66.0 in | Wt 140.0 lb

## 2024-06-25 DIAGNOSIS — Z719 Counseling, unspecified: Secondary | ICD-10-CM | POA: Insufficient documentation

## 2024-06-25 NOTE — Progress Notes (Signed)
 Patient ID: Leslie Dixon, female    DOB: 1945-12-21, 78 y.o.   MRN: [id number]   Chief Complaint  Patient presents with   Advice Only    The patient is an 78 year old female here for evaluation of her face.  She would like to diminish the puffiness around her lower lids.  She also does not like the wrinkling around her mouth.  On exam she has clearly had a lower lid blepharoplasty.  Her lower lids appear to be ptotic and either too much skin was removed or she is aged in such a way that she has got scleral show around the lower part of her eyes.  I think that she also has ptosis of the upper lids as well as dermatochalasis.  She has a couple red spots that I think would respond well to laser.    Review of Systems  Constitutional: Negative.   HENT: Negative.    Eyes: Negative.   Respiratory: Negative.    Cardiovascular: Negative.   Gastrointestinal: Negative.   Endocrine: Negative.   Genitourinary: Negative.   Musculoskeletal: Negative.     Past Medical History:  Diagnosis Date   Allergy    Arthritis    Asthma    Blood dyscrasia    LEUKOPENIA- DR. CHISM - NO TREATMENT BUT IS MONITORED   Cataract    Chronic back pain    Chronic back pain    Fibromyalgia    Foot drop, right    RELATED TO LUMBAR PROBLEMS   GERD (gastroesophageal reflux disease)    PRILOSEC PRN   History of kidney stones    Horseshoe kidney    BILATERAL   Internal hemorrhoids    Leukopenia    Migraine    OCCULAR MIGRAINES - AURA WITH EYES   Osteoporosis    Pain    LUMBAR PAIN - RECENT FALL BECAUSE LEGS GAVE OUT - PT HAS HAD LUMBAR INJECTION SINCE THE FALL THAT HAS HELPED BACK PAIN   Pain    CHRONIC RIGHT UPPER QUADRANT PAIN - RELATED TO GALLBLADDER PROBLEM   Sciatica of right side    Sinusitis    f/u with Dr. Roark.    Vertigo     Past Surgical History:  Procedure Laterality Date   ABDOMINAL HYSTERECTOMY  1983   APPENDECTOMY  1968   BILATERAL SALPINGOOPHORECTOMY  2000   BREAST EXCISIONAL  BIOPSY Right 1979   CHOLECYSTECTOMY N/A 04/12/2014   Procedure: LAPAROSCOPIC CHOLECYSTECTOMY WITH INTRAOPERATIVE CHOLANGIOGRAM;  Surgeon: Adina Lunger, MD;  Location: [facility];  Service: General;  Laterality: N/A;   INTRAMEDULLARY (IM) NAIL INTERTROCHANTERIC Right 08/08/2022   Procedure: INTRAMEDULLARY (IM) NAIL INTERTROCHANTERIC;  Surgeon: Fidel Rogue, MD;  Location: [facility];  Service: Orthopedics;  Laterality: Right;   Lumbar/cervical surgeries     CERVICAL FUSION C7-C6 WITH BONE GRAFT; C6-C5 WITH PLATE AND 4 SCREWS;  3 LUMBAR SURGERIES BUT NO FUSION   NASAL SINUS SURGERY  2010   right knee     ARTHROSCOPY   TONSILLECTOMY     TYMPANOSTOMY TUBE PLACEMENT  07/2017   WISDOM TOOTH PULLED        Current Outpatient Medications:    albuterol  (VENTOLIN  HFA) 108 (90 Base) MCG/ACT inhaler, Inhale 2 puffs into the lungs every 6 (six) hours as needed for wheezing or shortness of breath., Disp: 54 g, Rfl: 1   Cholecalciferol (VITAMIN D -3) 125 MCG (5000 UT) TABS, Take 1 tablet by mouth daily., Disp: , Rfl:    famotidine  (PEPCID )  20 MG tablet, Take 20 mg by mouth daily., Disp: , Rfl:    fluticasone  (FLOVENT  HFA) 110 MCG/ACT inhaler, Inhale 2 puffs into the lungs 2 (two) times daily., Disp: 3 each, Rfl: 4   loratadine  (CLARITIN ) 10 MG tablet, Take 10 mg by mouth daily., Disp: , Rfl:    SUMAtriptan (IMITREX) 50 MG tablet, Take 50 mg by mouth every 2 (two) hours as needed for migraine., Disp: , Rfl:    triamcinolone  (NASACORT ) 55 MCG/ACT AERO nasal inhaler, Place 2 sprays into the nose daily as needed (allergies)., Disp: , Rfl:    vitamin B-12 (CYANOCOBALAMIN ) 1000 MCG tablet, Take 1,000 mcg by mouth once a week., Disp: , Rfl:    vitamin E 400 UNIT capsule, Take 400 Units by mouth in the morning and at bedtime., Disp: , Rfl:    omeprazole  (PRILOSEC) 20 MG capsule, Take 1 capsule (20 mg total) by mouth daily. (Patient not taking: Reported on 06/25/2024), Disp: 90 capsule, Rfl: 3   Objective:   Vitals:    06/25/24 1316  BP: 124/74  Pulse: 77  SpO2: 92%    Physical Exam Vitals reviewed.  Constitutional:      Appearance: Normal appearance.  HENT:     Head: Atraumatic.  Cardiovascular:     Rate and Rhythm: Normal rate.     Pulses: Normal pulses.  Pulmonary:     Effort: Pulmonary effort is normal.  Abdominal:     Palpations: Abdomen is soft.  Skin:    General: Skin is warm.     Capillary Refill: Capillary refill takes less than 2 seconds.  Neurological:     Mental Status: She is alert and oriented to person, place, and time.  Psychiatric:        Mood and Affect: Mood normal.        Behavior: Behavior normal.        Thought Content: Thought content normal.        Judgment: Judgment normal.     Assessment & Plan:  Encounter for counseling  Due to the ptosis of the upper lids I think she would do best with oculoplastics.  I have sent the information to Dr. Zaldivar to evaluate her.  She may also need forehead lift as well.  Pictures were obtained of the patient and placed in the chart with the patient's or guardian's permission.   Estefana RAMAN Liliane Mallis, DO

## 2024-08-01 ENCOUNTER — Encounter: Payer: Self-pay | Admitting: Internal Medicine

## 2024-08-01 ENCOUNTER — Ambulatory Visit: Admitting: Internal Medicine

## 2024-08-01 ENCOUNTER — Ambulatory Visit: Payer: Self-pay | Admitting: Pulmonary Disease

## 2024-08-01 VITALS — BP 92/64 | HR 74 | Temp 97.9°F | Ht 66.0 in | Wt 139.0 lb

## 2024-08-01 DIAGNOSIS — J45901 Unspecified asthma with (acute) exacerbation: Secondary | ICD-10-CM | POA: Diagnosis not present

## 2024-08-01 MED ORDER — BUDESONIDE-FORMOTEROL FUMARATE 80-4.5 MCG/ACT IN AERO: INHALATION_SPRAY | RESPIRATORY_TRACT | 12 refills | Status: AC

## 2024-08-01 MED ORDER — AZITHROMYCIN 250 MG PO TABS: ORAL_TABLET | ORAL | 0 refills | Status: AC

## 2024-08-01 MED ORDER — PREDNISONE 10 MG PO TABS: ORAL_TABLET | ORAL | 0 refills | Status: AC

## 2024-08-01 NOTE — Telephone Encounter (Signed)
 Noted, NFN

## 2024-08-01 NOTE — Telephone Encounter (Signed)
 FYI Only or Action Required?: FYI only for provider: appointment scheduled on 1/7.  Patient is followed in Pulmonology for asthma, last seen on 05/03/2024 by Tamea Dedra CROME, MD.  Called Nurse Triage reporting Cough.  Symptoms began a week ago.  Interventions attempted:OTC sudafed, Mucinex, Rx fluticasone   Symptoms are: gradually worsening.  Triage Disposition: See Physician Within 24 Hours  Patient/caregiver understands and will follow disposition?: Yes  E2C2 Pulmonary Triage - Initial Assessment Questions Chief Complaint (e.g., cough, sob, wheezing, fever, chills, sweat or additional symptoms) *Go to specific symptom protocol after initial questions. Cough, wheezing-Sun/Mon  How long have symptoms been present? 5-7 days  Have you tested for COVID or Flu? Note: If not, ask patient if a home test can be taken. If so, instruct patient to call back for positive results. No  MEDICINES:   Have you used any OTC meds to help with symptoms? Yes If yes, ask What medications? Sudafed, Mucinex  Have you used your inhalers/maintenance medication? Yes If yes, What medications? fluticasone   If inhaler, ask How many puffs and how often? Note: Review instructions on medication in the chart. 2 puffs/2 times day  OXYGEN: Do you wear supplemental oxygen? No If yes, How many liters are you supposed to use? na  Do you monitor your oxygen levels? No If yes, What is your reading (oxygen level) today? na  What is your usual oxygen saturation reading?  (Note: Pulmonary O2 sats should be 90% or greater) na    Copied from CRM #[id number]. Topic: Clinical - Red Word Triage >> Aug 01, 2024 10:08 AM Leslie Dixon wrote: Red Word that prompted transfer to Nurse Triage: Crackling cough. SOB, chest discomfort. Reason for Disposition  [1] Continuous (nonstop) coughing interferes with work or school AND [2] no improvement using cough treatment per Care Advice  Answer  Assessment - Initial Assessment Questions Patient states she is unable to drive to [place] today- she lives in [place]. Patient has been scheduled for acute visit in [place]- will find someone to drive her to this appointment    1. ONSET: When did the cough begin?      Over 1 week 2. SEVERITY: How bad is the cough today?      Coughing thick mucus 3. SPUTUM: Describe the color of your sputum (e.g., none, dry cough; clear, white, yellow, green)     Yellow-dark, greenish 4. HEMOPTYSIS: Are you coughing up any blood? If Yes, ask: How much? (e.g., flecks, streaks, tablespoons, etc.)     no 5. DIFFICULTY BREATHING: Are you having difficulty breathing? If Yes, ask: How bad is it? (e.g., mild, moderate, severe)      SOB- not as bad as 4-5 days ago 6. FEVER: Do you have a fever? If Yes, ask: What is your temperature, how was it measured, and when did it start?     no 7. CARDIAC HISTORY: Do you have any history of heart disease? (e.g., heart attack, congestive heart failure)      no 8. LUNG HISTORY: Do you have any history of lung disease?  (e.g., pulmonary embolus, asthma, emphysema)     asthma 9. PE RISK FACTORS: Do you have a history of blood clots? (or: recent major surgery, recent prolonged travel, bedridden)     no 10. OTHER SYMPTOMS: Do you have any other symptoms? (e.g., runny nose, wheezing, chest pain)       Wheezing, nasal congestion  Protocols used: Cough - Acute Productive-A-AH

## 2024-08-01 NOTE — Patient Instructions (Addendum)
 Prednisone  10 mg take  4 each am x 2 days,   2 each am x 2 days,  1 each am x 2 days and stop   Zpak    Stop powdered inhalers and try symbicort  80 Take 2 puffs first thing in am and then another 2 puffs about 12 hours later until better for at least a week then ok to taper down off   Plan A = Automatic = Always=     Symbicort  80 Take 2 puffs first thing in am and then another 2 puffs about 12 hours later.    Work on inhaler technique:  relax and gently blow all the way out then take a nice smooth full deep breath back in, triggering the inhaler at same time you start breathing in.  Hold breath in for at least  5 seconds if you can. Blow out symbicort  80  thru nose. Rinse and gargle with water  when done.  If mouth or throat bother you at all,  try brushing teeth/gums/tongue with arm and hammer toothpaste/ make a slurry and gargle and spit out.    Plan B = Backup (to supplement plan A, not to replace it) Use your albuterol  inhaler as a rescue medication to be used if you can't catch your breath by resting or slowing your pace  or doing a relaxed purse lip breathing pattern.  - The less you use it, the better it will work when you need it. - Ok to use the inhaler up to 2 puffs  every 4 hours if you must but call for appointment if use goes up over your usual need - Don't leave home without it !!  (think of it like the spare tire or starter fluid for your car)   Follow up in this clinic as needed

## 2024-08-01 NOTE — Progress Notes (Addendum)
 "  Leslie Dixon, female    DOB: 1946-07-04   MRN: [id number]   Brief patient profile:  47   yowf  never smoker  DR Tamea pt who self  referred to pulmonary clinic 08/01/2024 for cough / wheezing / chest tightness on Arnuity as a maintenance rx   PFTs  with no airflow obstruction as recently as 04/18/24     History of Present Illness  08/01/2024  Pulmonary/ 1st office eval/Nagee Goates  Arnuity and prn saba  Chief Complaint  Patient presents with   Acute Visit    Cough and wheezing x 3 wks, worse over the past few days. She is coughing up yellow to green sputum.  She has some occ chest tightness and crackling in chest.   Dyspnea:  was  out shopping a week prior to OV  with severe cough bothering her much more than her breathing and generalized chest tightness better with saba but really not using much now  Cough: slt green  Sleep: variably flat and 2 thick pillows  SABA use: none in 4-5 days  02 ldz:wnwz     No obvious day to day or daytime pattern/variability or assoc   mucus plugs or hemoptysis or cp    or overt   hb symptoms.    Also denies any obvious fluctuation of symptoms with weather or environmental changes or other aggravating or alleviating factors except as outlined above   No unusual exposure hx or h/o childhood pna/ asthma or knowledge of premature birth.  Current Allergies, Complete Past Medical History, Past Surgical History, Family History, and Social History were reviewed in [employer] record.  ROS  The following are not active complaints unless bolded Hoarseness, sore throat, dysphagia, dental problems, itching, sneezing,  nasal congestion or discharge of excess mucus or purulent secretions, ear ache,   fever, chills, sweats, unintended wt loss or wt gain, classically pleuritic or exertional cp,  orthopnea pnd or arm/hand swelling  or leg swelling, presyncope, palpitations, abdominal pain, anorexia, nausea, vomiting, diarrhea  or change in bowel  habits or change in bladder habits, change in stools or change in urine, dysuria, hematuria,  rash, arthralgias, visual complaints, headache, numbness, weakness or ataxia or problems with walking or coordination,  change in mood or  memory.             Outpatient Medications Prior to Visit  Medication Sig Dispense Refill   albuterol  (VENTOLIN  HFA) 108 (90 Base) MCG/ACT inhaler Inhale 2 puffs into the lungs every 6 (six) hours as needed for wheezing or shortness of breath. 54 g 1   Cholecalciferol (VITAMIN D -3) 125 MCG (5000 UT) TABS Take 1 tablet by mouth daily.     famotidine  (PEPCID ) 20 MG tablet Take 20 mg by mouth daily.     Fluticasone  Furoate (ARNUITY ELLIPTA ) 100 MCG/ACT AEPB Inhale 1 puff into the lungs daily.     loratadine  (CLARITIN ) 10 MG tablet Take 10 mg by mouth daily.     SUMAtriptan (IMITREX) 50 MG tablet Take 50 mg by mouth every 2 (two) hours as needed for migraine.     triamcinolone  (NASACORT ) 55 MCG/ACT AERO nasal inhaler Place 2 sprays into the nose daily as needed (allergies).     vitamin B-12 (CYANOCOBALAMIN ) 1000 MCG tablet Take 1,000 mcg by mouth once a week.     vitamin E 400 UNIT capsule Take 400 Units by mouth in the morning and at bedtime.     fluticasone  (FLOVENT  HFA) 110  MCG/ACT inhaler Inhale 2 puffs into the lungs 2 (two) times daily. (Patient not taking: Reported on 08/01/2024) 3 each 4   omeprazole  (PRILOSEC) 20 MG capsule Take 1 capsule (20 mg total) by mouth daily. (Patient not taking: Reported on 08/01/2024) 90 capsule 3   No facility-administered medications prior to visit.    Past Medical History:  Diagnosis Date   Allergy    Arthritis    Asthma    Blood dyscrasia    LEUKOPENIA- DR. CHISM - NO TREATMENT BUT IS MONITORED   Cataract    Chronic back pain    Chronic back pain    Fibromyalgia    Foot drop, right    RELATED TO LUMBAR PROBLEMS   GERD (gastroesophageal reflux disease)    PRILOSEC PRN   History of kidney stones    Horseshoe kidney     BILATERAL   Internal hemorrhoids    Leukopenia    Migraine    OCCULAR MIGRAINES - AURA WITH EYES   Osteoporosis    Pain    LUMBAR PAIN - RECENT FALL BECAUSE LEGS GAVE OUT - PT HAS HAD LUMBAR INJECTION SINCE THE FALL THAT HAS HELPED BACK PAIN   Pain    CHRONIC RIGHT UPPER QUADRANT PAIN - RELATED TO GALLBLADDER PROBLEM   Sciatica of right side    Sinusitis    f/u with Dr. Roark.    Vertigo       Objective:     BP 92/64   Pulse 74   Temp 97.9 F (36.6 C) (Oral)   Ht 5' 6 (1.676 m) Comment: on RA  Wt 139 lb (63 kg)   SpO2 96% Comment: on RA  BMI 22.44 kg/m    SpO2: 96 % (on RA) amb wf with congested sounding cough   HEENT : Oropharynx  clear   NECK :  without  apparent JVD/ palpable Nodes/TM    LUNGS: no acc muscle use,  Min barrel  contour chest wall with bilateral  Distant exp rhonchi   and  without cough on insp or exp maneuvers  and mild  Hyperresonant  to  percussion bilaterally     CV:  RRR  no s3 or murmur or increase in P2, and no edema   ABD:  soft and nontender   MS:  Nl gait/ ext warm without deformities Or obvious joint restrictions  calf tenderness, cyanosis or clubbing     SKIN: warm and dry without lesions    NEURO:  alert, approp, nl sensorium with  no motor or cerebellar deficits apparent.        Assessment   Assessment & Plan Asthmatic bronchitis with acute exacerbation, unspecified asthma severity, unspecified whether persistent Never smoker with PFTs  with no airflow obstruction as recently as 04/18/24  - 08/01/2024  After extensive coaching inhaler device,  effectiveness =    75% with hfa  >>> try change arnuity to symbicort  80 2bid then if all better try tapering it to  prn to take advantage of the rapid acting component for exacerbations and possible improve long term compliance as she may be able to tell it's working whereas ICS alone have such a low adherence rate.  >>> and pred x 6 d/ zpakfor apparent URI with now a congested  cough  She reports the cough is chronic but intermittently exacerbates with viral illness and apparently no real change in either the chronic pattern or the acute one on Arnuity which itself is a possible contributor to  the cough which I have found to be more of a day than night time pattern much more so than cough variant asthma so will try HFA laba/ics  and rec also she continue max gerd rx and f/u in this clinic prn if Dr KANDICE not available.       Each maintenance medication was reviewed in detail including emphasizing most importantly the difference between maintenance and prns and under what circumstances the prns are to be triggered using an action plan format where appropriate.  Total time for H and P, chart review, counseling, reviewing hfa vs DPI  device(s) and generating customized AVS unique to this office visit / same day charting = 35 min new pt eval  with   refractory respiratory  symptoms of uncertain etiology          AVS  Patient Instructions  Prednisone  10 mg take  4 each am x 2 days,   2 each am x 2 days,  1 each am x 2 days and stop   Zpak    Stop powdered inhalers and try symbicort  80 Take 2 puffs first thing in am and then another 2 puffs about 12 hours later until better for at least a week then ok to taper down off   Plan A = Automatic = Always=     Symbicort  80 Take 2 puffs first thing in am and then another 2 puffs about 12 hours later.    Work on inhaler technique:  relax and gently blow all the way out then take a nice smooth full deep breath back in, triggering the inhaler at same time you start breathing in.  Hold breath in for at least  5 seconds if you can. Blow out symbicort  80  thru nose. Rinse and gargle with water  when done.  If mouth or throat bother you at all,  try brushing teeth/gums/tongue with arm and hammer toothpaste/ make a slurry and gargle and spit out.    Plan B = Backup (to supplement plan A, not to replace it) Use your albuterol  inhaler as a  rescue medication to be used if you can't catch your breath by resting or slowing your pace  or doing a relaxed purse lip breathing pattern.  - The less you use it, the better it will work when you need it. - Ok to use the inhaler up to 2 puffs  every 4 hours if you must but call for appointment if use goes up over your usual need - Don't leave home without it !!  (think of it like the spare tire or starter fluid for your car)   Follow up in this clinic as needed       Ozell America, MD 08/04/2024     "

## 2024-08-04 NOTE — Assessment & Plan Note (Addendum)
 Never smoker with PFTs  with no airflow obstruction as recently as 04/18/24  - 08/01/2024  After extensive coaching inhaler device,  effectiveness =    75% with hfa  >>> try change arnuity to symbicort  80 2bid then if all better try tapering it to  prn to take advantage of the rapid acting component for exacerbations and possible improve long term compliance as she may be able to tell it's working whereas ICS alone have such a low adherence rate.  >>> and pred x 6 d/ zpakfor apparent URI with now a congested cough  She reports the cough is chronic but intermittently exacerbates with viral illness and apparently no real change in either the chronic pattern or the acute one on Arnuity which itself is a possible contributor to the cough which I have found to be more of a day than night time pattern much more so than cough variant asthma so will try HFA laba/ics  and rec also she continue max gerd rx and f/u in this clinic prn if Dr KANDICE not available.       Each maintenance medication was reviewed in detail including emphasizing most importantly the difference between maintenance and prns and under what circumstances the prns are to be triggered using an action plan format where appropriate.  Total time for H and P, chart review, counseling, reviewing hfa vs DPI  device(s) and generating customized AVS unique to this office visit / same day charting = 35 min new pt eval  with   refractory respiratory  symptoms of uncertain etiology

## 2024-09-18 ENCOUNTER — Ambulatory Visit: Admitting: Pulmonary Disease
